# Patient Record
Sex: Female | Born: 1935 | Race: White | Hispanic: No | Marital: Married | Smoking: Former smoker
Health system: Southern US, Community
[De-identification: ages and names within clinical notes are randomized; demographics above are authoritative.]

## PROBLEM LIST (undated history)

## (undated) DIAGNOSIS — I10 Essential (primary) hypertension: Secondary | ICD-10-CM

## (undated) DIAGNOSIS — U071 COVID-19: Secondary | ICD-10-CM

## (undated) DIAGNOSIS — S7291XA Unspecified fracture of right femur, initial encounter for closed fracture: Secondary | ICD-10-CM

## (undated) DIAGNOSIS — C37 Malignant neoplasm of thymus: Secondary | ICD-10-CM

## (undated) DIAGNOSIS — S42293A Other displaced fracture of upper end of unspecified humerus, initial encounter for closed fracture: Secondary | ICD-10-CM

## (undated) DIAGNOSIS — I4892 Unspecified atrial flutter: Secondary | ICD-10-CM

## (undated) DIAGNOSIS — I4819 Other persistent atrial fibrillation: Secondary | ICD-10-CM

## (undated) DIAGNOSIS — K219 Gastro-esophageal reflux disease without esophagitis: Secondary | ICD-10-CM

## (undated) DIAGNOSIS — R32 Unspecified urinary incontinence: Secondary | ICD-10-CM

## (undated) DIAGNOSIS — I5022 Chronic systolic (congestive) heart failure: Secondary | ICD-10-CM

## (undated) DIAGNOSIS — F329 Major depressive disorder, single episode, unspecified: Secondary | ICD-10-CM

## (undated) DIAGNOSIS — I428 Other cardiomyopathies: Secondary | ICD-10-CM

## (undated) DIAGNOSIS — I5042 Chronic combined systolic (congestive) and diastolic (congestive) heart failure: Secondary | ICD-10-CM

## (undated) DIAGNOSIS — C50919 Malignant neoplasm of unspecified site of unspecified female breast: Secondary | ICD-10-CM

## (undated) DIAGNOSIS — F32A Depression, unspecified: Secondary | ICD-10-CM

## (undated) DIAGNOSIS — E785 Hyperlipidemia, unspecified: Secondary | ICD-10-CM

## (undated) DIAGNOSIS — G5793 Unspecified mononeuropathy of bilateral lower limbs: Secondary | ICD-10-CM

## (undated) DIAGNOSIS — Z87442 Personal history of urinary calculi: Secondary | ICD-10-CM

## (undated) DIAGNOSIS — M1990 Unspecified osteoarthritis, unspecified site: Secondary | ICD-10-CM

## (undated) HISTORY — DX: Malignant neoplasm of unspecified site of unspecified female breast: C50.919

## (undated) HISTORY — DX: Unspecified mononeuropathy of bilateral lower limbs: G57.93

## (undated) HISTORY — PX: ABDOMINAL HYSTERECTOMY: SHX81

## (undated) HISTORY — DX: Other persistent atrial fibrillation: I48.19

## (undated) HISTORY — DX: Unspecified fracture of right femur, initial encounter for closed fracture: S72.91XA

## (undated) HISTORY — DX: Other displaced fracture of upper end of unspecified humerus, initial encounter for closed fracture: S42.293A

## (undated) HISTORY — PX: JOINT REPLACEMENT: SHX530

## (undated) HISTORY — PX: CHOLECYSTECTOMY: SHX55

## (undated) HISTORY — DX: Unspecified osteoarthritis, unspecified site: M19.90

## (undated) HISTORY — DX: COVID-19: U07.1

## (undated) HISTORY — DX: Unspecified atrial flutter: I48.92

## (undated) HISTORY — DX: Major depressive disorder, single episode, unspecified: F32.9

## (undated) HISTORY — DX: Unspecified urinary incontinence: R32

## (undated) HISTORY — PX: KNEE SURGERY: SHX244

## (undated) HISTORY — DX: Malignant neoplasm of thymus: C37

## (undated) HISTORY — DX: Other cardiomyopathies: I42.8

## (undated) HISTORY — PX: CATARACT EXTRACTION: SUR2

## (undated) HISTORY — DX: Chronic systolic (congestive) heart failure: I50.22

## (undated) HISTORY — PX: BREAST LUMPECTOMY: SHX2

## (undated) HISTORY — DX: Depression, unspecified: F32.A

## (undated) HISTORY — DX: Gastro-esophageal reflux disease without esophagitis: K21.9

## (undated) HISTORY — DX: Hyperlipidemia, unspecified: E78.5

## (undated) HISTORY — DX: Personal history of urinary calculi: Z87.442

---
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
## 1996-05-15 DIAGNOSIS — C50919 Malignant neoplasm of unspecified site of unspecified female breast: Secondary | ICD-10-CM

## 1996-05-15 HISTORY — DX: Malignant neoplasm of unspecified site of unspecified female breast: C50.919

## 2010-12-02 DIAGNOSIS — Z79899 Other long term (current) drug therapy: Secondary | ICD-10-CM | POA: Insufficient documentation

## 2011-03-01 DIAGNOSIS — I6529 Occlusion and stenosis of unspecified carotid artery: Secondary | ICD-10-CM | POA: Insufficient documentation

## 2011-03-03 DIAGNOSIS — J310 Chronic rhinitis: Secondary | ICD-10-CM | POA: Insufficient documentation

## 2011-10-03 DIAGNOSIS — G64 Other disorders of peripheral nervous system: Secondary | ICD-10-CM | POA: Insufficient documentation

## 2011-10-03 DIAGNOSIS — H353190 Nonexudative age-related macular degeneration, unspecified eye, stage unspecified: Secondary | ICD-10-CM | POA: Insufficient documentation

## 2011-11-15 DIAGNOSIS — E041 Nontoxic single thyroid nodule: Secondary | ICD-10-CM | POA: Insufficient documentation

## 2011-11-15 DIAGNOSIS — R918 Other nonspecific abnormal finding of lung field: Secondary | ICD-10-CM | POA: Insufficient documentation

## 2011-12-04 DIAGNOSIS — R0602 Shortness of breath: Secondary | ICD-10-CM | POA: Insufficient documentation

## 2012-01-05 DIAGNOSIS — R0789 Other chest pain: Secondary | ICD-10-CM | POA: Insufficient documentation

## 2012-06-24 DIAGNOSIS — M26649 Arthritis of unspecified temporomandibular joint: Secondary | ICD-10-CM | POA: Insufficient documentation

## 2012-11-05 DIAGNOSIS — N6019 Diffuse cystic mastopathy of unspecified breast: Secondary | ICD-10-CM | POA: Insufficient documentation

## 2013-07-21 DIAGNOSIS — Z229 Carrier of infectious disease, unspecified: Secondary | ICD-10-CM | POA: Insufficient documentation

## 2014-05-22 ENCOUNTER — Ambulatory Visit: Payer: Self-pay | Admitting: Nurse Practitioner

## 2014-06-15 DIAGNOSIS — M5116 Intervertebral disc disorders with radiculopathy, lumbar region: Secondary | ICD-10-CM | POA: Insufficient documentation

## 2014-06-15 DIAGNOSIS — M5416 Radiculopathy, lumbar region: Secondary | ICD-10-CM | POA: Insufficient documentation

## 2014-06-15 DIAGNOSIS — M47816 Spondylosis without myelopathy or radiculopathy, lumbar region: Secondary | ICD-10-CM | POA: Insufficient documentation

## 2014-06-15 DIAGNOSIS — M4726 Other spondylosis with radiculopathy, lumbar region: Secondary | ICD-10-CM | POA: Insufficient documentation

## 2014-06-15 DIAGNOSIS — M48061 Spinal stenosis, lumbar region without neurogenic claudication: Secondary | ICD-10-CM | POA: Insufficient documentation

## 2014-08-13 ENCOUNTER — Ambulatory Visit
Admit: 2014-08-13 | Disposition: A | Discharge status: Home / Self Care | Payer: Self-pay | Attending: Family Medicine | Admitting: Family Medicine

## 2014-09-24 ENCOUNTER — Other Ambulatory Visit: Payer: Self-pay | Admitting: Family Medicine

## 2014-09-24 DIAGNOSIS — R1084 Generalized abdominal pain: Secondary | ICD-10-CM

## 2014-09-24 DIAGNOSIS — R1032 Left lower quadrant pain: Secondary | ICD-10-CM

## 2014-09-25 ENCOUNTER — Ambulatory Visit
Admission: RE | Admit: 2014-09-25 | Discharge: 2014-09-25 | Disposition: A | Discharge status: Home / Self Care | Payer: Medicare Other | Source: Ambulatory Visit | Attending: Family Medicine | Admitting: Family Medicine

## 2014-09-25 DIAGNOSIS — R1032 Left lower quadrant pain: Secondary | ICD-10-CM | POA: Diagnosis present

## 2014-09-25 DIAGNOSIS — Z90710 Acquired absence of both cervix and uterus: Secondary | ICD-10-CM | POA: Insufficient documentation

## 2014-09-25 DIAGNOSIS — R1084 Generalized abdominal pain: Secondary | ICD-10-CM

## 2014-10-05 ENCOUNTER — Other Ambulatory Visit: Payer: Self-pay | Admitting: Family Medicine

## 2014-10-05 DIAGNOSIS — R109 Unspecified abdominal pain: Secondary | ICD-10-CM

## 2014-10-15 ENCOUNTER — Ambulatory Visit
Admission: RE | Admit: 2014-10-15 | Discharge: 2014-10-15 | Disposition: A | Discharge status: Home / Self Care | Payer: Medicare Other | Source: Ambulatory Visit | Attending: Family Medicine | Admitting: Family Medicine

## 2014-10-15 DIAGNOSIS — J479 Bronchiectasis, uncomplicated: Secondary | ICD-10-CM | POA: Diagnosis not present

## 2014-10-15 DIAGNOSIS — K838 Other specified diseases of biliary tract: Secondary | ICD-10-CM | POA: Diagnosis not present

## 2014-10-15 DIAGNOSIS — N200 Calculus of kidney: Secondary | ICD-10-CM | POA: Insufficient documentation

## 2014-10-15 DIAGNOSIS — N2889 Other specified disorders of kidney and ureter: Secondary | ICD-10-CM | POA: Diagnosis present

## 2014-10-15 DIAGNOSIS — R109 Unspecified abdominal pain: Secondary | ICD-10-CM

## 2014-10-15 DIAGNOSIS — K868 Other specified diseases of pancreas: Secondary | ICD-10-CM | POA: Insufficient documentation

## 2014-10-15 DIAGNOSIS — K862 Cyst of pancreas: Secondary | ICD-10-CM | POA: Diagnosis not present

## 2014-10-15 HISTORY — DX: Essential (primary) hypertension: I10

## 2014-10-15 MED ORDER — IOHEXOL 300 MG/ML  SOLN
100.0000 mL | Freq: Once | INTRAMUSCULAR | Status: AC | PRN
  Administered 2014-10-15: 100 mL via INTRAVENOUS

## 2014-10-16 ENCOUNTER — Telehealth: Payer: Self-pay | Admitting: Family Medicine

## 2014-10-16 NOTE — Telephone Encounter (Signed)
Contacted pt. Regarding CT Scan and MRI results. Pt. Was asked to make an appointment next week for obtaining bilirubin levels.

## 2014-10-20 ENCOUNTER — Ambulatory Visit (INDEPENDENT_AMBULATORY_CARE_PROVIDER_SITE_OTHER): Payer: Medicare Other | Admitting: Family Medicine

## 2014-10-20 ENCOUNTER — Encounter: Payer: Self-pay | Admitting: Family Medicine

## 2014-10-20 VITALS — BP 100/60 | HR 75 | Resp 15 | Ht 66.0 in | Wt 165.5 lb

## 2014-10-20 DIAGNOSIS — K838 Other specified diseases of biliary tract: Secondary | ICD-10-CM

## 2014-10-20 DIAGNOSIS — N200 Calculus of kidney: Secondary | ICD-10-CM | POA: Diagnosis not present

## 2014-10-20 DIAGNOSIS — K862 Cyst of pancreas: Secondary | ICD-10-CM | POA: Insufficient documentation

## 2014-10-20 NOTE — Progress Notes (Signed)
Name: Jodi Bullock   MRN: [id number]    DOB: Feb 01, 1925   Date:10/20/2014       Progress Note  Subjective  Chief Complaint  Chief Complaint  Patient presents with  . Follow-up    Patient reported you wanted he to come back for labs    HPI Comments: Pt. Here for follow up of MRI abdomen obtained on June 2nd, 2016. It showed possible mild dilatation of Common bile duct and pancreatic duct. No obstructing lesion identified.Interpreting radiologist commented that cannot rule out an ampullary lesion and recommended correlation with bilirubin levels. She is here for obtaining bilirubin levels.        Past Medical History  Diagnosis Date  . Cancer   . Hypertension    Past Surgical History  Procedure Laterality Date  . Abdominal hysterectomy    . Breast lumpectomy    . Joint replacement    . Cataract extraction    . Cholecystectomy     Family History  Problem Relation Age of Onset  . Stroke Mother   . Diabetes Father   . Heart disease Father       History  Substance Use Topics  . Smoking status: Former Research scientist (life sciences)  . Smokeless tobacco: Never Used  . Alcohol Use: No     Current outpatient prescriptions:  .  amLODipine (NORVASC) 2.5 MG tablet, Take 2.5 tablets by mouth daily., Disp: , Rfl: 3 .  aspirin 81 MG tablet, Take 1 tablet by mouth daily., Disp: , Rfl:  .  Cholecalciferol 2000 UNITS TABS, Take 1 tablet by mouth daily., Disp: , Rfl:  .  LORazepam (ATIVAN) 0.5 MG tablet, Take 0.5 tablets by mouth 2 (two) times daily., Disp: , Rfl: 3 .  losartan (COZAAR) 100 MG tablet, Take 100 mg by mouth daily., Disp: , Rfl: 3 .  lovastatin (MEVACOR) 40 MG tablet, Take 40 mg by mouth daily., Disp: , Rfl: 3 .  metoprolol tartrate (LOPRESSOR) 25 MG tablet, Take 25 mg by mouth 2 (two) times daily., Disp: , Rfl: 3 .  Multiple Vitamins-Minerals (PRESERVISION/LUTEIN) CAPS, Take 1 each by mouth 2 (two) times daily., Disp: , Rfl:  .  niacin (NIASPAN) 1000 MG CR tablet, Take 100 mg by mouth  daily., Disp: , Rfl: 3 .  oxybutynin (DITROPAN) 5 MG tablet, Take 2.5 mg by mouth 2 (two) times daily., Disp: , Rfl: 3 .  Polyethylene Glycol 1450 LIQD, POLYETHYLENE GLYCOL (Powder) - Historical Medication  17 grams daily Active, Disp: , Rfl:  .  traMADol (ULTRAM) 50 MG tablet, Take 50 mg by mouth 2 (two) times daily., Disp: , Rfl: 3  Allergies  Allergen Reactions  . Celebrex [Celecoxib]     Esophageal spasms    . Ciprofloxacin   . Ibuprofen   . Metronidazole     Review of Systems  Gastrointestinal: Negative for nausea, vomiting, abdominal pain, diarrhea, constipation and blood in stool.  Skin: Negative for itching.      Objective  Filed Vitals:   10/20/14 0851  BP: 100/60  Pulse: 75  Resp: 15  Height: 5\' 6"  (1.676 m)  Weight: 165 lb 8 oz (75.07 kg)     Physical Exam  Constitutional: She is well-developed, well-nourished, and in no distress.  HENT:  Head: Normocephalic and atraumatic.  Cardiovascular: Normal rate and regular rhythm.   Pulmonary/Chest: Effort normal.  Abdominal: Soft. There is tenderness in the right upper quadrant and epigastric area.  Nursing note and vitals reviewed.     No  results found for this or any previous visit (from the past 2160 hour(s)).   Assessment & Plan  1. Common bile duct dilatation We will obtain bilirubin levels to correlate with common bile duct and pancreatic duct dilatation seen on MRI Abdomen.  - Comprehensive metabolic panel - Bilirubin, fractionated (tot/dir/indir)  2. Pancreatic cyst Repeat MRI Abdomen in June 2017. Copies of CT Scan and MRI provided to the pt. She was reassured.  3. Kidney Stones Small upper pole kidney stones demonstrated on CT scan of Abdomen and Pelvis. Pt. Has an appointment with Urologist on June 27th.    Jodi Bullock [facility] 10/20/2014 9:22 AM

## 2014-10-21 LAB — COMPREHENSIVE METABOLIC PANEL
A/G RATIO: 2.2 (ref 1.1–2.5)
ALT: 13 IU/L (ref 0–32)
AST: 14 IU/L (ref 0–40)
Albumin: 4.4 g/dL (ref 3.5–4.7)
Alkaline Phosphatase: 107 IU/L (ref 39–117)
BUN/Creatinine Ratio: 17 (ref 11–26)
BUN: 13 mg/dL (ref 8–27)
Bilirubin Total: 0.5 mg/dL (ref 0.0–1.2)
CHLORIDE: 101 mmol/L (ref 97–108)
CO2: 25 mmol/L (ref 18–29)
Calcium: 9.3 mg/dL (ref 8.7–10.3)
Creatinine, Ser: 0.78 mg/dL (ref 0.57–1.00)
GFR calc Af Amer: 79 mL/min/{1.73_m2} (ref >59)
GFR, EST NON AFRICAN AMERICAN: 69 mL/min/{1.73_m2} (ref >59)
GLOBULIN, TOTAL: 2 g/dL (ref 1.5–4.5)
Glucose: 113 mg/dL — ABNORMAL HIGH (ref 65–99)
Potassium: 4.3 mmol/L (ref 3.5–5.2)
Sodium: 142 mmol/L (ref 134–144)
Total Protein: 6.4 g/dL (ref 6.0–8.5)

## 2014-10-21 LAB — BILIRUBIN, FRACTIONATED(TOT/DIR/INDIR)
Bilirubin, Direct: 0.13 mg/dL (ref 0.00–0.40)
Bilirubin, Indirect: 0.37 mg/dL (ref 0.10–0.80)

## 2014-10-27 ENCOUNTER — Other Ambulatory Visit: Payer: Self-pay | Admitting: Family Medicine

## 2014-10-27 NOTE — Telephone Encounter (Signed)
Requesting refill on Oxybutynin. Please send to [facility]

## 2014-10-28 MED ORDER — OXYBUTYNIN CHLORIDE 5 MG PO TABS: 2.5000 mg | ORAL_TABLET | Freq: Two times a day (BID) | ORAL | Status: DC

## 2014-10-28 NOTE — Telephone Encounter (Signed)
Medication entered

## 2014-11-09 ENCOUNTER — Ambulatory Visit: Payer: Self-pay

## 2014-11-24 ENCOUNTER — Ambulatory Visit (INDEPENDENT_AMBULATORY_CARE_PROVIDER_SITE_OTHER): Payer: Medicare Other | Admitting: Urology

## 2014-11-24 ENCOUNTER — Encounter: Payer: Self-pay | Admitting: Urology

## 2014-11-24 VITALS — BP 166/66 | HR 66 | Ht 66.0 in | Wt 169.0 lb

## 2014-11-24 DIAGNOSIS — C50919 Malignant neoplasm of unspecified site of unspecified female breast: Secondary | ICD-10-CM | POA: Insufficient documentation

## 2014-11-24 DIAGNOSIS — R3915 Urgency of urination: Secondary | ICD-10-CM | POA: Insufficient documentation

## 2014-11-24 DIAGNOSIS — A0472 Enterocolitis due to Clostridium difficile, not specified as recurrent: Secondary | ICD-10-CM | POA: Insufficient documentation

## 2014-11-24 DIAGNOSIS — N3946 Mixed incontinence: Secondary | ICD-10-CM | POA: Diagnosis not present

## 2014-11-24 DIAGNOSIS — R32 Unspecified urinary incontinence: Secondary | ICD-10-CM | POA: Insufficient documentation

## 2014-11-24 DIAGNOSIS — N200 Calculus of kidney: Secondary | ICD-10-CM | POA: Diagnosis not present

## 2014-11-24 LAB — BLADDER SCAN AMB NON-IMAGING: Scan Result: 95

## 2014-11-24 LAB — URINALYSIS, COMPLETE
Bilirubin, UA: NEGATIVE
Glucose, UA: NEGATIVE
Ketones, UA: NEGATIVE
LEUKOCYTES UA: NEGATIVE
Nitrite, UA: NEGATIVE
Protein, UA: NEGATIVE
RBC, UA: NEGATIVE
SPEC GRAV UA: 1.01 (ref 1.005–1.030)
Urobilinogen, Ur: 0.2 mg/dL (ref 0.2–1.0)
pH, UA: 7 (ref 5.0–7.5)

## 2014-11-24 LAB — MICROSCOPIC EXAMINATION: Bacteria, UA: NONE SEEN

## 2014-11-24 NOTE — Progress Notes (Signed)
I have been asked to see the patient by Dr. Rochel Brome, for evaluation and management of urinary incontinence.  History of present illness: 77F seen today for urinary incontinence.  This is a long standing problem.  She has been treated with oxybutynin for this.  She was noted to have small non-obstructing kidney stones bilaterally on most recent CT scan.  Urinary incontinence: The patient has been seen and evaluated by urologist in turn [place]. She was started on myrbetriq 25 mg daily. This was stopped because of headaches or neck correlated with her taking myrbetriq. She was then tried on several different anticholinergic medications and ultimately was found to respond best with oxybutynin 5 mg. She takes this twice daily. Her symptoms have improved such that she is only using 1 panty liner per day which she splits in half during the day. The patient denies any stress incontinence. She feels as if she empties her bladder completely. She states that her stream is relatively strong. She does not have to strain to void. She does not have postvoid dribbling. She does not have a history of recurrent urinary tract infections. The patient has no history of prolapse. She did have her uterus removed in 1965.  The patient's kidney stones which were noted as incidental findings on her recent CT scan are in the upper pole bilaterally. These are isolated stones measuring less than 5 mm. They're nonobstructing. The patient denies any flank pain. She denies any gross hematuria. She denies any history of stone passage in the past. She has no family history of kidney stones.  Review of systems: A 12 point comprehensive review of systems was obtained and is negative unless otherwise stated in the history of present illness.  Patient Active Problem List   Diagnosis Date Noted  . Breast CA 11/24/2014  . C. difficile colitis 11/24/2014  . Absence of bladder continence 11/24/2014  . Urgency of micturation  11/24/2014  . Common bile duct dilatation 10/20/2014  . Pancreatic cyst 10/20/2014  . Kidney stones 10/20/2014  . Atypical chest pain 01/05/2012  . Lung mass 11/15/2011  . Carotid artery narrowing 03/01/2011    Current Outpatient Prescriptions on File Prior to Visit  Medication Sig Dispense Refill  . amLODipine (NORVASC) 2.5 MG tablet Take 2.5 mg by mouth daily.   3  . aspirin 81 MG tablet Take 1 tablet by mouth daily.    . Cholecalciferol 2000 UNITS TABS Take 1 tablet by mouth daily.    Marland Kitchen LORazepam (ATIVAN) 0.5 MG tablet Take 0.5 tablets by mouth 2 (two) times daily.  3  . losartan (COZAAR) 100 MG tablet Take 100 mg by mouth daily.  3  . lovastatin (MEVACOR) 40 MG tablet Take 40 mg by mouth daily.  3  . metoprolol tartrate (LOPRESSOR) 25 MG tablet Take 12.5 mg by mouth 2 (two) times daily.   3  . Multiple Vitamins-Minerals (PRESERVISION/LUTEIN) CAPS Take 1 each by mouth 2 (two) times daily.    . niacin (NIASPAN) 1000 MG CR tablet Take 100 mg by mouth daily.  3  . oxybutynin (DITROPAN) 5 MG tablet Take 0.5 tablets (2.5 mg total) by mouth 2 (two) times daily. 60 tablet 2  . Polyethylene Glycol 1450 LIQD POLYETHYLENE GLYCOL (Powder) - Historical Medication  17 grams daily Active    . traMADol (ULTRAM) 50 MG tablet Take 50 mg by mouth 2 (two) times daily.  3   No current facility-administered medications on file prior to visit.  Past Medical History  Diagnosis Date  . Cancer   . Hypertension   . Breast cancer     Past Surgical History  Procedure Laterality Date  . Abdominal hysterectomy    . Breast lumpectomy    . Joint replacement    . Cataract extraction    . Cholecystectomy    . Thymus gland excision      History  Substance Use Topics  . Smoking status: Former Research scientist (life sciences)  . Smokeless tobacco: Never Used  . Alcohol Use: No    Family History  Problem Relation Age of Onset  . Stroke Mother   . Diabetes Father   . Heart disease Father     PE: There were no vitals  filed for this visit. Patient appears to be in no acute distress  patient is alert and oriented x3 Atraumatic normocephalic head No cervical or supraclavicular lymphadenopathy appreciated No increased work of breathing, no audible wheezes/rhonchi Regular sinus rhythm/rate Abdomen is soft, nontender, nondistended, no CVA or suprapubic tenderness Lower extremities are symmetric without appreciable edema Grossly neurologically intact No identifiable skin lesions  No results for input(s): WBC, HGB, HCT in the last 72 hours. No results for input(s): NA, K, CL, CO2, GLUCOSE, BUN, CREATININE, CALCIUM in the last 72 hours. No results for input(s): LABPT, INR in the last 72 hours. No results for input(s): LABURIN in the last 72 hours. No results found for this or any previous visit. Postvoid residual:95 mL  Imaging: none  Imp: #1 urge incontinence-this is been evaluated and worked up by a urologist in [place]. She has cycled through the medications and ultimately settled for oxybutynin. She is not tolerated this well given the side effects of dry mouth, dry eyes, and constipation. We discussed a second trial of myrbetriq 25 mg daily as this drug does not have the same anticholinergic side effects. If she is able to tolerate this but has not met her goal we could increase it to 50 mg. We will follow up in a month to see if she is made any progress at how well she tolerates the medication. #2 bilateral nonobstructing nephrolithiasis-we discussed the treatment options for nonobstructing nephrolithiasis. I referred the option of ureteroscopy, laser lithotripsy and stone removal followed by stent placement, annual surveillance with x-ray, and intervention when necessary. The stones of been there a long time and her not causing her any significant issue.after going over the 3 treatment options, we have opted to manage theseconservatively and have the patient follow-up on an as-needed basis for her  nonobstructing kidney stones. Recommendations:  Cc: Dr. Rollene Fare, [place]

## 2014-11-26 ENCOUNTER — Telehealth: Payer: Self-pay | Admitting: Family Medicine

## 2014-11-26 NOTE — Telephone Encounter (Signed)
Left message for patient to let her know that she already has refills on all of her medication and she can call [facility] on [address] for a refill.

## 2014-11-30 ENCOUNTER — Telehealth: Payer: Self-pay | Admitting: Family Medicine

## 2014-11-30 MED ORDER — NIACIN ER (ANTIHYPERLIPIDEMIC) 1000 MG PO TBCR: 1000.0000 mg | EXTENDED_RELEASE_TABLET | Freq: Every day | ORAL | Status: DC

## 2014-11-30 MED ORDER — AMLODIPINE BESYLATE 2.5 MG PO TABS: 2.5000 mg | ORAL_TABLET | Freq: Every day | ORAL | Status: DC

## 2014-11-30 MED ORDER — METOPROLOL TARTRATE 25 MG PO TABS: 12.5000 mg | ORAL_TABLET | Freq: Two times a day (BID) | ORAL | Status: DC

## 2014-11-30 MED ORDER — LOSARTAN POTASSIUM 100 MG PO TABS: 100.0000 mg | ORAL_TABLET | Freq: Every day | ORAL | Status: DC

## 2014-11-30 MED ORDER — LOVASTATIN 40 MG PO TABS: 40.0000 mg | ORAL_TABLET | Freq: Every day | ORAL | Status: DC

## 2014-11-30 NOTE — Telephone Encounter (Signed)
Medication has been refilled and sent to [facility] [address] Per patient.

## 2014-11-30 NOTE — Telephone Encounter (Signed)
Spoke with Pharmacy and medication has been refilled and is ready for pickup.

## 2014-11-30 NOTE — Telephone Encounter (Signed)
PER TAMEIKA THE PT HAD REFILLS ON HER RX AND SHE EVEN CALLED THE PHARM.

## 2014-11-30 NOTE — Telephone Encounter (Signed)
PT SAID THAT THE PHARM HAS NOT SEEN THE REQUEST FOR ALL HER MEDICATIONS. SHE HAD BROUGHT A PAPER HER TO THE OFFICE AND IT WAS GIVEN TO THE NURSE . THE PATIENT SAID THAT THE PHARM SAY THAT THEY HAVE NOT SEEN A REQUEST YET. SHE IS OUT OF THESE MEDS.

## 2014-12-01 ENCOUNTER — Ambulatory Visit (INDEPENDENT_AMBULATORY_CARE_PROVIDER_SITE_OTHER): Payer: Medicare Other | Admitting: Family Medicine

## 2014-12-01 ENCOUNTER — Encounter: Payer: Self-pay | Admitting: Family Medicine

## 2014-12-01 VITALS — BP 150/80 | HR 75 | Temp 98.0°F | Resp 20 | Ht 66.0 in | Wt 170.2 lb

## 2014-12-01 DIAGNOSIS — F419 Anxiety disorder, unspecified: Secondary | ICD-10-CM | POA: Insufficient documentation

## 2014-12-01 DIAGNOSIS — M791 Myalgia: Secondary | ICD-10-CM

## 2014-12-01 DIAGNOSIS — M7918 Myalgia, other site: Secondary | ICD-10-CM

## 2014-12-01 DIAGNOSIS — E785 Hyperlipidemia, unspecified: Secondary | ICD-10-CM | POA: Diagnosis not present

## 2014-12-01 DIAGNOSIS — I10 Essential (primary) hypertension: Secondary | ICD-10-CM | POA: Diagnosis not present

## 2014-12-01 MED ORDER — AMLODIPINE BESYLATE 5 MG PO TABS: 5.0000 mg | ORAL_TABLET | Freq: Every day | ORAL | Status: DC

## 2014-12-01 MED ORDER — LORAZEPAM 0.5 MG PO TABS: 0.5000 mg | ORAL_TABLET | Freq: Two times a day (BID) | ORAL | Status: DC

## 2014-12-01 MED ORDER — TRAMADOL HCL 50 MG PO TABS: 50.0000 mg | ORAL_TABLET | Freq: Two times a day (BID) | ORAL | Status: DC

## 2014-12-01 NOTE — Progress Notes (Signed)
Name: Jodi Bullock   MRN: [id number]    DOB: 12-Aug-1924   Date:12/01/2014       Progress Note  Subjective  Chief Complaint  Chief Complaint  Patient presents with  . Follow-up    3 mo.  . Hypertension  . Hyperlipidemia  . Depression    Hypertension This is a chronic problem. The problem is uncontrolled. Associated symptoms include headaches (soreness in frontal part of head, noticed since she was started on Myrbetriq by Urologist). Pertinent negatives include no anxiety, chest pain, malaise/fatigue, orthopnea, palpitations or shortness of breath. Past treatments include beta blockers, calcium channel blockers and angiotensin blockers. The current treatment provides significant improvement. There is no history of angina, kidney disease, CAD/MI or CVA.  Hyperlipidemia This is a chronic problem. The problem is controlled. Recent lipid tests were reviewed and are normal. Pertinent negatives include no chest pain, leg pain, myalgias or shortness of breath. Current antihyperlipidemic treatment includes statins and nicotinic acid. The current treatment provides significant improvement of lipids. Risk factors for coronary artery disease include dyslipidemia.  Anxiety Presents for follow-up visit. Symptoms include depressed mood, excessive worry, irritability and nervous/anxious behavior. Patient reports no chest pain, insomnia, palpitations, panic or shortness of breath. The severity of symptoms is moderate. The symptoms are aggravated by family issues. The quality of sleep is good.   Past treatments include benzodiazephines. The treatment provided moderate relief. Compliance with prior treatments has been good.      Past Medical History  Diagnosis Date  . Cancer 1998    breast  . Hypertension   . Breast cancer   . Hypoplasia, thymus gland     cancer  . Hypertension   . HLD (hyperlipidemia)   . Neuropathy of both feet   . GERD (gastroesophageal reflux disease)   . History of kidney  stones   . Arthritis   . Depression     Past Surgical History  Procedure Laterality Date  . Abdominal hysterectomy    . Breast lumpectomy    . Joint replacement Right     Hip  . Cataract extraction    . Cholecystectomy    . Knee surgery Right     Family History  Problem Relation Age of Onset  . Stroke Mother   . Diabetes Father   . Heart disease Father   . Kidney disease Neg Hx   . Bladder Cancer Neg Hx     History   Social History  . Marital Status: Married    Spouse Name: N/A  . Number of Children: N/A  . Years of Education: N/A   Occupational History  . Not on file.   Social History Main Topics  . Smoking status: Former Research scientist (life sciences)  . Smokeless tobacco: Never Used     Comment: smoked in high school for a few years only  . Alcohol Use: No  . Drug Use: No  . Sexual Activity: Not Currently   Other Topics Concern  . Not on file   Social History Narrative     Current outpatient prescriptions:  .  amLODipine (NORVASC) 5 MG tablet, Take 1 tablet (5 mg total) by mouth daily., Disp: 30 tablet, Rfl: 0 .  aspirin 81 MG tablet, Take 1 tablet by mouth daily., Disp: , Rfl:  .  Cholecalciferol 2000 UNITS TABS, Take 1 tablet by mouth daily., Disp: , Rfl:  .  LORazepam (ATIVAN) 0.5 MG tablet, Take 1 tablet (0.5 mg total) by mouth 2 (two) times daily., Disp: 60 tablet,  Rfl: 0 .  losartan (COZAAR) 100 MG tablet, Take 1 tablet (100 mg total) by mouth daily., Disp: 30 tablet, Rfl: 3 .  lovastatin (MEVACOR) 40 MG tablet, Take 1 tablet (40 mg total) by mouth daily., Disp: 30 tablet, Rfl: 3 .  metoprolol tartrate (LOPRESSOR) 25 MG tablet, Take 0.5 tablets (12.5 mg total) by mouth 2 (two) times daily., Disp: 30 tablet, Rfl: 3 .  Multiple Vitamins-Minerals (PRESERVISION/LUTEIN) CAPS, Take 1 each by mouth 2 (two) times daily., Disp: , Rfl:  .  niacin (NIASPAN) 1000 MG CR tablet, Take 1 tablet (1,000 mg total) by mouth at bedtime., Disp: 30 tablet, Rfl: 3 .  Polyethylene Glycol 1450  LIQD, POLYETHYLENE GLYCOL (Powder) - Historical Medication  17 grams daily Active, Disp: , Rfl:  .  traMADol (ULTRAM) 50 MG tablet, Take 1 tablet (50 mg total) by mouth 2 (two) times daily., Disp: 60 tablet, Rfl: 2  Allergies  Allergen Reactions  . Celebrex [Celecoxib]     Esophageal spasms    . Ciprofloxacin   . Ibuprofen   . Metronidazole      Review of Systems  Constitutional: Positive for irritability. Negative for malaise/fatigue.  Respiratory: Negative for shortness of breath.   Cardiovascular: Negative for chest pain, palpitations and orthopnea.  Musculoskeletal: Negative for myalgias.  Neurological: Positive for headaches (soreness in frontal part of head, noticed since she was started on Myrbetriq by Urologist).  Psychiatric/Behavioral: The patient is nervous/anxious. The patient does not have insomnia.       Objective  Filed Vitals:   12/01/14 0951 12/01/14 1044  BP: 150/75 150/80  Pulse: 75   Temp: 98 F (36.7 C)   TempSrc: Oral   Resp: 20   Height: 5\' 6"  (1.676 m)   Weight: 170 lb 3.2 oz (77.202 kg)   SpO2: 92%     Physical Exam  Constitutional: She is oriented to person, place, and time and well-developed, well-nourished, and in no distress.  HENT:  Head: Normocephalic and atraumatic.  Cardiovascular: Normal rate and regular rhythm.   Pulmonary/Chest: Effort normal and breath sounds normal.  Abdominal: Soft. Bowel sounds are normal.  Musculoskeletal: She exhibits no edema.  Neurological: She is alert and oriented to person, place, and time.  Skin: Skin is warm and dry.  Psychiatric: Mood, memory, affect and judgment normal.  Nursing note and vitals reviewed.    Assessment & Plan 1. Essential hypertension We will increase amlodipine to 5 mg to manage her poorly controlled blood pressure. Elevated blood pressure most likely from  Myrbetriq which was recently started by urology. Follow-up in one month - amLODipine (NORVASC) 5 MG tablet; Take 1 tablet  (5 mg total) by mouth daily.  Dispense: 30 tablet; Refill: 0  2. HLD (hyperlipidemia)  - Lipid Profile - Comprehensive metabolic panel  3. Anxiety Symptoms stable and controlled on lorazepam 0.5 mg twice a day when necessary. Patient is aware of the dependence potential of benzodiazepines. She is taking the medication as directed and only as needed.  4. Myofascial pain Long-standing generalized musculoskeletal pain more responsive to tramadol. Refills provided and follow-up in one month. - traMADol (ULTRAM) 50 MG tablet; Take 1 tablet (50 mg total) by mouth 2 (two) times daily.  Dispense: 60 tablet; Refill: 2   [place] [facility] 12/01/2014 10:44 AM

## 2014-12-02 LAB — COMPREHENSIVE METABOLIC PANEL
ALK PHOS: 79 IU/L (ref 39–117)
ALT: 14 IU/L (ref 0–32)
AST: 18 IU/L (ref 0–40)
Albumin/Globulin Ratio: 2 (ref 1.1–2.5)
Albumin: 4.2 g/dL (ref 3.5–4.7)
BUN/Creatinine Ratio: 21 (ref 11–26)
BUN: 14 mg/dL (ref 8–27)
Bilirubin Total: 0.4 mg/dL (ref 0.0–1.2)
CO2: 26 mmol/L (ref 18–29)
CREATININE: 0.66 mg/dL (ref 0.57–1.00)
Calcium: 9.6 mg/dL (ref 8.7–10.3)
Chloride: 104 mmol/L (ref 97–108)
GFR, EST AFRICAN AMERICAN: 92 mL/min/{1.73_m2} (ref >59)
GFR, EST NON AFRICAN AMERICAN: 80 mL/min/{1.73_m2} (ref >59)
GLOBULIN, TOTAL: 2.1 g/dL (ref 1.5–4.5)
GLUCOSE: 107 mg/dL — AB (ref 65–99)
Potassium: 5.2 mmol/L (ref 3.5–5.2)
Sodium: 144 mmol/L (ref 134–144)
TOTAL PROTEIN: 6.3 g/dL (ref 6.0–8.5)

## 2014-12-02 LAB — LIPID PANEL
CHOLESTEROL TOTAL: 138 mg/dL (ref 100–199)
Chol/HDL Ratio: 2 ratio units (ref 0.0–4.4)
HDL: 69 mg/dL (ref >39)
LDL CALC: 54 mg/dL (ref 0–99)
TRIGLYCERIDES: 77 mg/dL (ref 0–149)
VLDL Cholesterol Cal: 15 mg/dL (ref 5–40)

## 2014-12-22 ENCOUNTER — Ambulatory Visit (INDEPENDENT_AMBULATORY_CARE_PROVIDER_SITE_OTHER): Payer: Medicare Other | Admitting: Urology

## 2014-12-22 VITALS — BP 122/63 | HR 64 | Ht 67.0 in | Wt 170.0 lb

## 2014-12-22 DIAGNOSIS — R32 Unspecified urinary incontinence: Secondary | ICD-10-CM | POA: Diagnosis not present

## 2014-12-22 LAB — BLADDER SCAN AMB NON-IMAGING: Scan Result: 32

## 2014-12-22 LAB — URINALYSIS, COMPLETE
BILIRUBIN UA: NEGATIVE
Glucose, UA: NEGATIVE
Ketones, UA: NEGATIVE
Nitrite, UA: NEGATIVE
Protein, UA: NEGATIVE
RBC UA: NEGATIVE
Specific Gravity, UA: 1.01 (ref 1.005–1.030)
Urobilinogen, Ur: 1 mg/dL (ref 0.2–1.0)
pH, UA: 6.5 (ref 5.0–7.5)

## 2014-12-22 LAB — MICROSCOPIC EXAMINATION
BACTERIA UA: NONE SEEN
RBC, UA: NONE SEEN /hpf (ref 0–?)

## 2014-12-22 NOTE — Patient Instructions (Signed)
Recommended an initial trial of myrbetriq 25 mg daily. If tolerated without side effects, I recommend that she double the dose to 50 mg daily. Patient will follow-up with me in 1 month to see how she tolerated myrbetriq.

## 2014-12-22 NOTE — Progress Notes (Signed)
Seen today for f/u of urge History of present illness: 24F seen today for urinary incontinence.  This is a long standing problem.  She has been treated with oxybutynin for this.  She was noted to have small non-obstructing kidney stones bilaterally on most recent CT scan.  Urinary incontinence: The patient has been seen and evaluated by urologist in [facility]. She was started on myrbetriq 25 mg daily. This was stopped because of headaches and correlated with her taking myrbetriq. She was then tried on several different anticholinergic medications and ultimately was found to respond best with oxybutynin 5 mg. She takes this twice daily. Her symptoms have improved such that she is only using 1 panty liner per day which she splits in half during the day. The patient denies any stress incontinence. She feels as if she empties her bladder completely. She states that her stream is relatively strong. She does not have to strain to void. She does not have postvoid dribbling. She does not have a history of recurrent urinary tract infections. The patient has no history of prolapse. She did have her uterus removed in 1965.  Intv: Switched to Myrbetriq 25mg  to minimize her side effects.  Seen today for f/u or symptoms. The patient states that she had taken the myrbetriq for 1 week and then noted a tension headache behind her for head. She was also seen by her PCP who increased her blood pressure medication. The patient is not clear as to whether her headache and increased blood pressure was associated with myrbetriq or not. However, she has not been taking it.  Bilateral small non-obstructing stones.    Patient Active Problem List   Diagnosis Date Noted  . Hypertension 12/01/2014  . HLD (hyperlipidemia) 12/01/2014  . Anxiety 12/01/2014  . Myofascial pain 12/01/2014  . Breast CA 11/24/2014  . C. difficile colitis 11/24/2014  . Absence of bladder continence 11/24/2014  . Urgency of micturation  11/24/2014  . Common bile duct dilatation 10/20/2014  . Pancreatic cyst 10/20/2014  . Kidney stones 10/20/2014  . Atypical chest pain 01/05/2012  . Lung mass 11/15/2011  . Carotid artery narrowing 03/01/2011    Current Outpatient Prescriptions on File Prior to Visit  Medication Sig Dispense Refill  . amLODipine (NORVASC) 5 MG tablet Take 1 tablet (5 mg total) by mouth daily. 30 tablet 0  . aspirin 81 MG tablet Take 1 tablet by mouth daily.    . Cholecalciferol 2000 UNITS TABS Take 1 tablet by mouth daily.    Marland Kitchen LORazepam (ATIVAN) 0.5 MG tablet Take 1 tablet (0.5 mg total) by mouth 2 (two) times daily. 60 tablet 0  . losartan (COZAAR) 100 MG tablet Take 1 tablet (100 mg total) by mouth daily. 30 tablet 3  . lovastatin (MEVACOR) 40 MG tablet Take 1 tablet (40 mg total) by mouth daily. 30 tablet 3  . metoprolol tartrate (LOPRESSOR) 25 MG tablet Take 0.5 tablets (12.5 mg total) by mouth 2 (two) times daily. 30 tablet 3  . Multiple Vitamins-Minerals (PRESERVISION/LUTEIN) CAPS Take 1 each by mouth 2 (two) times daily.    . niacin (NIASPAN) 1000 MG CR tablet Take 1 tablet (1,000 mg total) by mouth at bedtime. 30 tablet 3  . Polyethylene Glycol 1450 LIQD POLYETHYLENE GLYCOL (Powder) - Historical Medication  17 grams daily Active    . traMADol (ULTRAM) 50 MG tablet Take 1 tablet (50 mg total) by mouth 2 (two) times daily. 60 tablet 2   No current facility-administered medications  on file prior to visit.    Past Medical History  Diagnosis Date  . Cancer 1998    breast  . Hypertension   . Breast cancer   . Hypoplasia, thymus gland     cancer  . Hypertension   . HLD (hyperlipidemia)   . Neuropathy of both feet   . GERD (gastroesophageal reflux disease)   . History of kidney stones   . Arthritis   . Depression     Past Surgical History  Procedure Laterality Date  . Abdominal hysterectomy    . Breast lumpectomy    . Joint replacement Right     Hip  . Cataract extraction    .  Cholecystectomy    . Knee surgery Right     History  Substance Use Topics  . Smoking status: Former Research scientist (life sciences)  . Smokeless tobacco: Never Used     Comment: smoked in high school for a few years only  . Alcohol Use: No    Family History  Problem Relation Age of Onset  . Stroke Mother   . Diabetes Father   . Heart disease Father   . Kidney disease Neg Hx   . Bladder Cancer Neg Hx     PE: There were no vitals filed for this visit. The patient continues to have urinary incontinence and nocturia.  No results for input(s): WBC, HGB, HCT in the last 72 hours. No results for input(s): NA, K, CL, CO2, GLUCOSE, BUN, CREATININE, CALCIUM in the last 72 hours. No results for input(s): LABPT, INR in the last 72 hours. No results for input(s): LABURIN in the last 72 hours. Results for orders placed or performed in visit on 11/24/14  Microscopic Examination     Status: None   Collection Time: 11/24/14 11:30 AM  Result Value Ref Range Status   WBC, UA 0-5 0 -  5 /hpf Final   RBC, UA 0-2 0 -  2 /hpf Final   Epithelial Cells (non renal) 0-10 0 - 10 /hpf Final   Bacteria, UA None seen None seen/Few Final   Postvoid residual:95 mL  Imaging: none  Imp: #1 urge incontinence-we discussed the patient's side effects from the myrbetriq. She tends to think that her side effects were not associated with myrbetriq. I recommended that she give it one more try on the medication and if she notes severe headache or worsening blood pressure control again she should stop the medication. If she is tolerating the medication without significant side effects she may want to increase the dose to 50 mg daily if her symptoms are improving. Our plan is to follow up in one month to see how she is doing with the current regimen. If the patient decides that myrbetriq has too many side effects, we will likely move towards posterior tibial nerve stimulation. She has tried numerous anticholinergic, and not found them to be  particular helpful and the side effects intolerable.   #2 bilateral nonobstructing nephrolithiasis- follow-up as needed.  Cc: Dr. Rollene Fare, [place]

## 2014-12-28 ENCOUNTER — Telehealth: Payer: Self-pay | Admitting: Family Medicine

## 2014-12-28 DIAGNOSIS — I10 Essential (primary) hypertension: Secondary | ICD-10-CM

## 2014-12-28 MED ORDER — AMLODIPINE BESYLATE 5 MG PO TABS: 5.0000 mg | ORAL_TABLET | Freq: Every day | ORAL | Status: DC

## 2014-12-28 NOTE — Telephone Encounter (Signed)
Prescription for amlodipine 5 mg daily is sent to patient's pharmacy. Spine clinic referral is for patient's spouse Mr. Armp Delprado, who is also our patient. That referral has already been entered in Mr. Armp Dicke's chart.

## 2014-12-28 NOTE — Telephone Encounter (Signed)
Routed not to Dr. Manuella Ghazi to order spine clinic referral and change medication dosage

## 2014-12-28 NOTE — Telephone Encounter (Signed)
AMLODIPINE (SAID THAT YOU WERE GOING TO INCREASE IT THAT IS WHY SHE IS NEEDING REFILL) PHARM IS [facility] ON [address] ALSO WANTS TO KNOW ABOUT THE SPINE CLINIC REFERRAL WHAT IS THE STATUS ON IT. PER SHELLY THE REFERRAL PERSON SHE HAS NOT RECEIVED ANY MESSAGE TO DO A REFERRAL.

## 2014-12-29 NOTE — Telephone Encounter (Signed)
Medication has been refilled and sent to [facility]

## 2014-12-31 ENCOUNTER — Ambulatory Visit (INDEPENDENT_AMBULATORY_CARE_PROVIDER_SITE_OTHER): Payer: Medicare Other | Admitting: Family Medicine

## 2014-12-31 ENCOUNTER — Encounter: Payer: Self-pay | Admitting: Family Medicine

## 2014-12-31 VITALS — BP 118/74 | HR 71 | Temp 96.9°F | Resp 18 | Ht 67.0 in | Wt 169.9 lb

## 2014-12-31 DIAGNOSIS — R7301 Impaired fasting glucose: Secondary | ICD-10-CM | POA: Diagnosis not present

## 2014-12-31 DIAGNOSIS — I10 Essential (primary) hypertension: Secondary | ICD-10-CM | POA: Diagnosis not present

## 2014-12-31 DIAGNOSIS — Z833 Family history of diabetes mellitus: Secondary | ICD-10-CM | POA: Insufficient documentation

## 2014-12-31 NOTE — Progress Notes (Signed)
Name: Jodi Bullock   MRN: [id number]    DOB: 1924/08/30   Date:12/31/2014       Progress Note  Subjective  Chief Complaint  Chief Complaint  Patient presents with  . Follow-up    4 wk. BP  . Hypertension  . Hyperlipidemia    Hypertension This is a chronic problem. The problem is controlled. Pertinent negatives include no headaches or palpitations. Past treatments include calcium channel blockers, beta blockers and angiotensin blockers. The current treatment provides significant improvement. There is no history of kidney disease, CAD/MI or CVA.  Blood pressure is improved after amlodipine was increased from 2.5 mg to 5 mg daily. No apparent side effects.    Past Medical History  Diagnosis Date  . Cancer 1998    breast  . Hypertension   . Breast cancer   . Hypoplasia, thymus gland     cancer  . Hypertension   . HLD (hyperlipidemia)   . Neuropathy of both feet   . GERD (gastroesophageal reflux disease)   . History of kidney stones   . Arthritis   . Depression     Past Surgical History  Procedure Laterality Date  . Abdominal hysterectomy    . Breast lumpectomy    . Joint replacement Right     Hip  . Cataract extraction    . Cholecystectomy    . Knee surgery Right     Family History  Problem Relation Age of Onset  . Stroke Mother   . Diabetes Father   . Heart disease Father   . Kidney disease Neg Hx   . Bladder Cancer Neg Hx     Social History   Social History  . Marital Status: Married    Spouse Name: N/A  . Number of Children: N/A  . Years of Education: N/A   Occupational History  . Not on file.   Social History Main Topics  . Smoking status: Former Research scientist (life sciences)  . Smokeless tobacco: Never Used     Comment: smoked in high school for a few years only  . Alcohol Use: No  . Drug Use: No  . Sexual Activity: Not Currently   Other Topics Concern  . Not on file   Social History Narrative     Current outpatient prescriptions:  .  amLODipine  (NORVASC) 5 MG tablet, Take 1 tablet (5 mg total) by mouth daily., Disp: 90 tablet, Rfl: 0 .  aspirin 81 MG tablet, Take 1 tablet by mouth daily., Disp: , Rfl:  .  Cholecalciferol 2000 UNITS TABS, Take 1 tablet by mouth daily., Disp: , Rfl:  .  LORazepam (ATIVAN) 0.5 MG tablet, Take 1 tablet (0.5 mg total) by mouth 2 (two) times daily., Disp: 60 tablet, Rfl: 0 .  losartan (COZAAR) 100 MG tablet, Take 1 tablet (100 mg total) by mouth daily., Disp: 30 tablet, Rfl: 3 .  lovastatin (MEVACOR) 40 MG tablet, Take 1 tablet (40 mg total) by mouth daily., Disp: 30 tablet, Rfl: 3 .  metoprolol tartrate (LOPRESSOR) 25 MG tablet, Take 0.5 tablets (12.5 mg total) by mouth 2 (two) times daily., Disp: 30 tablet, Rfl: 3 .  Multiple Vitamins-Minerals (PRESERVISION/LUTEIN) CAPS, Take 1 each by mouth 2 (two) times daily., Disp: , Rfl:  .  niacin (NIASPAN) 1000 MG CR tablet, Take 1 tablet (1,000 mg total) by mouth at bedtime., Disp: 30 tablet, Rfl: 3 .  oxybutynin (DITROPAN) 5 MG tablet, , Disp: , Rfl: 2 .  Polyethylene Glycol 1450 LIQD, POLYETHYLENE GLYCOL (  Powder) - Historical Medication  17 grams daily Active, Disp: , Rfl:  .  traMADol (ULTRAM) 50 MG tablet, Take 1 tablet (50 mg total) by mouth 2 (two) times daily., Disp: 60 tablet, Rfl: 2  Allergies  Allergen Reactions  . Celebrex [Celecoxib]     Esophageal spasms    . Ciprofloxacin   . Ibuprofen   . Metronidazole      Review of Systems  Cardiovascular: Negative for palpitations.  Neurological: Negative for headaches.      Objective  Filed Vitals:   12/31/14 1046  BP: 118/74  Pulse: 71  Temp: 96.9 F (36.1 C)  TempSrc: Oral  Resp: 18  Height: 5\' 7"  (1.702 m)  Weight: 169 lb 14.4 oz (77.066 kg)  SpO2: 97%    Physical Exam  Constitutional: She is well-developed, well-nourished, and in no distress.  Cardiovascular: Normal rate and regular rhythm.   Pulmonary/Chest: Effort normal and breath sounds normal.  Musculoskeletal:       Right  ankle: She exhibits swelling. Tenderness.       Left ankle: She exhibits swelling.  Nursing note and vitals reviewed.   Assessment & Plan  1. Essential hypertension Blood Pressure is at goal on present therapy. Discussed ankle edema as a possible side effect of amlodipine. Patient is going to monitor her symptoms and RTC if the swelling gets worse.  2. Fasting hyperglycemia  - HgB A1c    Matie Dimaano Asad [facility] 12/31/2014 11:11 AM

## 2015-01-01 LAB — HEMOGLOBIN A1C
Est. average glucose Bld gHb Est-mCnc: 128 mg/dL
Hgb A1c MFr Bld: 6.1 % — ABNORMAL HIGH (ref 4.8–5.6)

## 2015-01-20 ENCOUNTER — Ambulatory Visit: Payer: Medicare Other

## 2015-01-25 ENCOUNTER — Ambulatory Visit (INDEPENDENT_AMBULATORY_CARE_PROVIDER_SITE_OTHER): Payer: Medicare Other | Admitting: Urology

## 2015-01-25 ENCOUNTER — Encounter: Payer: Self-pay | Admitting: Urology

## 2015-01-25 VITALS — BP 129/70 | HR 76 | Ht 66.0 in | Wt 168.5 lb

## 2015-01-25 DIAGNOSIS — Z9181 History of falling: Secondary | ICD-10-CM | POA: Insufficient documentation

## 2015-01-25 DIAGNOSIS — K6289 Other specified diseases of anus and rectum: Secondary | ICD-10-CM | POA: Insufficient documentation

## 2015-01-25 DIAGNOSIS — Z853 Personal history of malignant neoplasm of breast: Secondary | ICD-10-CM | POA: Insufficient documentation

## 2015-01-25 DIAGNOSIS — R42 Dizziness and giddiness: Secondary | ICD-10-CM | POA: Insufficient documentation

## 2015-01-25 DIAGNOSIS — N200 Calculus of kidney: Secondary | ICD-10-CM

## 2015-01-25 DIAGNOSIS — Z23 Encounter for immunization: Secondary | ICD-10-CM | POA: Insufficient documentation

## 2015-01-25 DIAGNOSIS — C37 Malignant neoplasm of thymus: Secondary | ICD-10-CM | POA: Insufficient documentation

## 2015-01-25 DIAGNOSIS — M1990 Unspecified osteoarthritis, unspecified site: Secondary | ICD-10-CM | POA: Insufficient documentation

## 2015-01-25 DIAGNOSIS — IMO0001 Reserved for inherently not codable concepts without codable children: Secondary | ICD-10-CM | POA: Insufficient documentation

## 2015-01-25 DIAGNOSIS — E559 Vitamin D deficiency, unspecified: Secondary | ICD-10-CM | POA: Insufficient documentation

## 2015-01-25 DIAGNOSIS — R51 Headache: Secondary | ICD-10-CM

## 2015-01-25 DIAGNOSIS — Z1331 Encounter for screening for depression: Secondary | ICD-10-CM | POA: Insufficient documentation

## 2015-01-25 DIAGNOSIS — N39498 Other specified urinary incontinence: Secondary | ICD-10-CM | POA: Diagnosis not present

## 2015-01-25 DIAGNOSIS — E079 Disorder of thyroid, unspecified: Secondary | ICD-10-CM | POA: Insufficient documentation

## 2015-01-25 DIAGNOSIS — R519 Headache, unspecified: Secondary | ICD-10-CM | POA: Insufficient documentation

## 2015-01-25 DIAGNOSIS — K219 Gastro-esophageal reflux disease without esophagitis: Secondary | ICD-10-CM | POA: Insufficient documentation

## 2015-01-25 DIAGNOSIS — N3941 Urge incontinence: Secondary | ICD-10-CM

## 2015-01-25 DIAGNOSIS — I739 Peripheral vascular disease, unspecified: Secondary | ICD-10-CM | POA: Insufficient documentation

## 2015-01-25 DIAGNOSIS — J349 Unspecified disorder of nose and nasal sinuses: Secondary | ICD-10-CM | POA: Insufficient documentation

## 2015-01-25 DIAGNOSIS — Z789 Other specified health status: Secondary | ICD-10-CM | POA: Insufficient documentation

## 2015-01-25 DIAGNOSIS — Z87898 Personal history of other specified conditions: Secondary | ICD-10-CM | POA: Insufficient documentation

## 2015-01-25 DIAGNOSIS — E010 Iodine-deficiency related diffuse (endemic) goiter: Secondary | ICD-10-CM | POA: Insufficient documentation

## 2015-01-25 LAB — URINALYSIS, COMPLETE
BILIRUBIN UA: NEGATIVE
Glucose, UA: NEGATIVE
NITRITE UA: NEGATIVE
RBC, UA: NEGATIVE
Specific Gravity, UA: 1.025 (ref 1.005–1.030)
Urobilinogen, Ur: 4 mg/dL — ABNORMAL HIGH (ref 0.2–1.0)
pH, UA: 5.5 (ref 5.0–7.5)

## 2015-01-25 LAB — MICROSCOPIC EXAMINATION: RBC, UA: NONE SEEN /hpf (ref 0–?)

## 2015-01-25 NOTE — Progress Notes (Signed)
F/u -  1 - urge incontinence - patient with a long h/o urge incontinence. She has tried multiple anticholinergic. She was on oxybutynin 5 mg BID. In [place], per her recollection she tried Retail [employer], and [employer], too. She had bothersome dry mouth. She started and titrated up to Myrbetriq 50 mg. She has frequency and urgency. She wears 3 - 4 of pads per day and they are wet at night (uses a heavy pad). No gross hematuria or dysuria.   UA clear   2-small bilateral stones - noted on June 2016 CT. She has had no flank pain or stone passage.    ROS - 14 system - + for back pain, sinus problems, anxiety   A/P -  Stones - stable  Urgency, UUI - discussed nature, r/b of combination anticholinergic + myrbetriq, PT, Interstim, PTNS and Botox. She elects to proceed with PTNS. I gave her some samples of Myrbetriq 50 mg. I might continue Myretriq and wean off if PTNS is effective.

## 2015-01-29 ENCOUNTER — Telehealth: Payer: Self-pay | Admitting: *Deleted

## 2015-01-29 NOTE — Telephone Encounter (Signed)
Tried to contact patient in regards to setting up her PTNS treatments. LMOM. Insurance does not need prior Josem Kaufmann will be on [place] schedule for Friday afternoons. Has patient tried and failed on to meds?

## 2015-02-01 NOTE — Telephone Encounter (Signed)
Patient called back and we scheduled her PTNS appointments for the next six weeks at 130 with Ria Comment.

## 2015-02-02 ENCOUNTER — Encounter: Payer: Self-pay | Admitting: Family Medicine

## 2015-02-02 ENCOUNTER — Ambulatory Visit (INDEPENDENT_AMBULATORY_CARE_PROVIDER_SITE_OTHER): Payer: Medicare Other | Admitting: Family Medicine

## 2015-02-02 VITALS — BP 116/66 | HR 94 | Temp 97.9°F | Resp 16 | Ht 66.0 in | Wt 170.3 lb

## 2015-02-02 DIAGNOSIS — I10 Essential (primary) hypertension: Secondary | ICD-10-CM | POA: Diagnosis not present

## 2015-02-02 DIAGNOSIS — F419 Anxiety disorder, unspecified: Secondary | ICD-10-CM | POA: Diagnosis not present

## 2015-02-02 DIAGNOSIS — R609 Edema, unspecified: Secondary | ICD-10-CM

## 2015-02-02 DIAGNOSIS — M25473 Effusion, unspecified ankle: Secondary | ICD-10-CM

## 2015-02-02 MED ORDER — LORAZEPAM 0.5 MG PO TABS: 0.5000 mg | ORAL_TABLET | Freq: Two times a day (BID) | ORAL | Status: DC | PRN

## 2015-02-02 NOTE — Progress Notes (Signed)
Name: Jodi Bullock   MRN: [id number]    DOB: 06-22-1924   Date:02/02/2015       Progress Note  Subjective  Chief Complaint  Chief Complaint  Patient presents with  . Foot Swelling    HPI  Leg and Ankle Swelling Both ankles and feet swelling since last week, more noticeable since the last weekend.. She has no fevers, chills, dyspnea, or chest pain. In the past, we have discussed peripheral edema as a side effect of amlodipine.  Past Medical History  Diagnosis Date  . Cancer 1998    breast  . Hypertension   . Breast cancer   . Hypoplasia, thymus gland     cancer  . Hypertension   . HLD (hyperlipidemia)   . Neuropathy of both feet   . GERD (gastroesophageal reflux disease)   . History of kidney stones   . Arthritis   . Depression     Past Surgical History  Procedure Laterality Date  . Abdominal hysterectomy    . Breast lumpectomy    . Joint replacement Right     Hip  . Cataract extraction    . Cholecystectomy    . Knee surgery Right     Family History  Problem Relation Age of Onset  . Stroke Mother   . Diabetes Father   . Heart disease Father   . Kidney disease Neg Hx   . Bladder Cancer Neg Hx     Social History   Social History  . Marital Status: Married    Spouse Name: N/A  . Number of Children: N/A  . Years of Education: N/A   Occupational History  . Not on file.   Social History Main Topics  . Smoking status: Former Research scientist (life sciences)  . Smokeless tobacco: Never Used     Comment: smoked in high school for a few years only  . Alcohol Use: No  . Drug Use: No  . Sexual Activity: Not Currently   Other Topics Concern  . Not on file   Social History Narrative     Current outpatient prescriptions:  .  amLODipine (NORVASC) 5 MG tablet, Take 1 tablet (5 mg total) by mouth daily., Disp: 90 tablet, Rfl: 0 .  aspirin 81 MG tablet, Take 1 tablet by mouth daily., Disp: , Rfl:  .  Cholecalciferol 2000 UNITS TABS, Take 1 tablet by mouth daily., Disp: , Rfl:  .   LORazepam (ATIVAN) 0.5 MG tablet, Take 1 tablet (0.5 mg total) by mouth 2 (two) times daily., Disp: 60 tablet, Rfl: 0 .  losartan (COZAAR) 100 MG tablet, Take 1 tablet (100 mg total) by mouth daily., Disp: 30 tablet, Rfl: 3 .  lovastatin (MEVACOR) 40 MG tablet, Take 1 tablet (40 mg total) by mouth daily., Disp: 30 tablet, Rfl: 3 .  metoprolol tartrate (LOPRESSOR) 25 MG tablet, Take 0.5 tablets (12.5 mg total) by mouth 2 (two) times daily., Disp: 30 tablet, Rfl: 3 .  Multiple Vitamins-Minerals (PRESERVISION/LUTEIN) CAPS, Take 1 each by mouth 2 (two) times daily., Disp: , Rfl:  .  niacin (NIASPAN) 1000 MG CR tablet, Take 1 tablet (1,000 mg total) by mouth at bedtime., Disp: 30 tablet, Rfl: 3 .  oxybutynin (DITROPAN) 5 MG tablet, , Disp: , Rfl: 2 .  Polyethylene Glycol 1450 LIQD, POLYETHYLENE GLYCOL (Powder) - Historical Medication  17 grams daily Active, Disp: , Rfl:  .  traMADol (ULTRAM) 50 MG tablet, Take 1 tablet (50 mg total) by mouth 2 (two) times daily., Disp: 60  tablet, Rfl: 2  Allergies  Allergen Reactions  . 2,4-D Dimethylamine (Amisol) Other (See Comments)    Other Reaction: OTHER REACTION-IRRITATION TO Esophagous  . Celebrex [Celecoxib]     Esophageal spasms    . Ciprofloxacin   . Ibuprofen   . Metronidazole   . Other Other (See Comments)    Other Reaction: esophagitis     Review of Systems  Respiratory: Negative for shortness of breath.   Cardiovascular: Positive for leg swelling. Negative for chest pain.  Psychiatric/Behavioral: Positive for depression. The patient is nervous/anxious and has insomnia.    Objective  Filed Vitals:   02/02/15 1147  BP: 116/66  Pulse: 94  Temp: 97.9 F (36.6 C)  Resp: 16  Height: 5\' 6"  (1.676 m)  Weight: 170 lb 5 oz (77.253 kg)  SpO2: 95%    Physical Exam  Constitutional: She is well-developed, well-nourished, and in no distress.  Cardiovascular: Normal rate and regular rhythm.   Pulmonary/Chest: Effort normal and breath sounds  normal.  Musculoskeletal:       Right ankle: She exhibits swelling.       Left ankle: She exhibits swelling. Tenderness.  Nursing note and vitals reviewed.   Assessment & Plan  1. Anxiety Symptoms of anxiety are stable on benzodiazepine therapy. Patient taking the medication as directed and is aware of the dependence potential of lorazepam. Refill provided. - LORazepam (ATIVAN) 0.5 MG tablet; Take 1 tablet (0.5 mg total) by mouth 2 (two) times daily as needed for anxiety.  Dispense: 60 tablet; Refill: 0  2. Essential hypertension Patient is to temporarily discontinue amlodipine and increase metoprolol to 25 mg twice daily. Recheck in 1-2 weeks.  3. Ankle edema DC amlodipine for now and reevaluate in one week.   Syed Asad A[facility] 02/02/2015 12:25 PM

## 2015-02-05 ENCOUNTER — Ambulatory Visit (INDEPENDENT_AMBULATORY_CARE_PROVIDER_SITE_OTHER): Payer: Medicare Other | Admitting: Obstetrics and Gynecology

## 2015-02-05 ENCOUNTER — Encounter: Payer: Self-pay | Admitting: Obstetrics and Gynecology

## 2015-02-05 VITALS — BP 147/69 | HR 94 | Resp 16 | Ht 66.0 in | Wt 171.2 lb

## 2015-02-05 DIAGNOSIS — N3941 Urge incontinence: Secondary | ICD-10-CM | POA: Diagnosis not present

## 2015-02-05 LAB — PTNS-PERCUTANEOUS TIBIAL NERVE STIMULATION: Scan Result: 3

## 2015-02-05 NOTE — Progress Notes (Signed)
Chief Complaint:  Chief Complaint  Patient presents with  . Urge incontinence    PTNS     HPI: Patient is a 81 year old female presenting today for her first PTNS treatment for OAB.      Symptoms began years ago.        Amount of leakage: varies     Leakage occurs multiple times per day- at least 3     Daytime Frequency- 6 per day- varies      Nighttime Frequency 4-5 times per night     Pain or burning with urination      Perception of the need to urinate no awareness, leak right after aware, leak 1-2 minutes after aware, aware-can't get to the bathroom on time, can't get to the bathroom     Incontinence episodes 3-4 perday     Protection used heavy pads 3-4 per day, overnight diapers     Fluid intake unsure of cups of fluid per day,  0 cups of caffeine beverages per day?, # alcohol intake per day?      Previous Therapy: Behavior Modifications      Anticholinergics- Oxybutin, Vesicare, Detrol  Currently taking Myrbetriq.  Will ween off medication pending response to to PTNS treatment.     Other previous therapy  BP 147/69 mmHg  Pulse 94  Resp 16  Ht 5\' 6"  (1.676 m)  Wt 171 lb 3.2 oz (77.656 kg)  BMI 27.65 kg/m2  Contraindications present for PTNS      Pacemaker- No      Implantable defibrillator- No      History of abnormal bleeding- No      History of neuropathies or nerve damage- mild diabetic neuropathy otherwise no  Discussed with patient possible complications of procedure, such as discomfort, bleeding at insertion/stimulation site, procedure consent signed  Patient goals: Reduce urgency and urge incontinence Reduce daytime frequency Reduce enuresis and sleep through the night  PTNS treatment: Right Leg- Level 3-  response toe flex  Treatment Plan:   No fluids before bed.  Continue PTNS treatments.

## 2015-02-10 ENCOUNTER — Ambulatory Visit (INDEPENDENT_AMBULATORY_CARE_PROVIDER_SITE_OTHER): Payer: Medicare Other | Admitting: Family Medicine

## 2015-02-10 ENCOUNTER — Encounter: Payer: Self-pay | Admitting: Family Medicine

## 2015-02-10 VITALS — BP 132/64 | HR 85 | Temp 98.0°F | Resp 16 | Ht 66.0 in | Wt 172.7 lb

## 2015-02-10 DIAGNOSIS — R609 Edema, unspecified: Secondary | ICD-10-CM | POA: Diagnosis not present

## 2015-02-10 DIAGNOSIS — M7918 Myalgia, other site: Secondary | ICD-10-CM

## 2015-02-10 DIAGNOSIS — M791 Myalgia: Secondary | ICD-10-CM

## 2015-02-10 DIAGNOSIS — M25473 Effusion, unspecified ankle: Secondary | ICD-10-CM

## 2015-02-10 MED ORDER — TRAMADOL HCL 50 MG PO TABS: 50.0000 mg | ORAL_TABLET | Freq: Two times a day (BID) | ORAL | Status: DC

## 2015-02-10 NOTE — Progress Notes (Signed)
Name: Jodi Bullock   MRN: [id number]    DOB: 27-Dec-1924   Date:02/10/2015       Progress Note  Subjective  Chief Complaint  Chief Complaint  Patient presents with  . Follow-up    1 week for bilateral foot swelling  . Hypertension    patient states swelling has improved d/c amlodopine and increased metoprolol to 25mg  bid    HPI Pt. Is here for follow up of bilateral ankle swelling. She was seen last week and her ankles were swollen. We had her D/C Amlodipine (due to possible side effect of peripheral edema) and increase Metoprolol to keep blood pressure under control. Her ankle swelling is relatively unchanged. Her BP is stable.  Past Medical History  Diagnosis Date  . Cancer 1998    breast  . Hypertension   . Breast cancer   . Hypoplasia, thymus gland     cancer  . Hypertension   . HLD (hyperlipidemia)   . Neuropathy of both feet   . GERD (gastroesophageal reflux disease)   . History of kidney stones   . Arthritis   . Depression     Past Surgical History  Procedure Laterality Date  . Abdominal hysterectomy    . Breast lumpectomy    . Joint replacement Right     Hip  . Cataract extraction    . Cholecystectomy    . Knee surgery Right     Family History  Problem Relation Age of Onset  . Stroke Mother   . Diabetes Father   . Heart disease Father   . Kidney disease Neg Hx   . Bladder Cancer Neg Hx     Social History   Social History  . Marital Status: Married    Spouse Name: N/A  . Number of Children: N/A  . Years of Education: N/A   Occupational History  . Not on file.   Social History Main Topics  . Smoking status: Former Research scientist (life sciences)  . Smokeless tobacco: Never Used     Comment: smoked in high school for a few years only  . Alcohol Use: No  . Drug Use: No  . Sexual Activity: Not Currently   Other Topics Concern  . Not on file   Social History Narrative     Current outpatient prescriptions:  .  aspirin 81 MG tablet, Take 1 tablet by mouth  daily., Disp: , Rfl:  .  Cholecalciferol 2000 UNITS TABS, Take 1 tablet by mouth daily., Disp: , Rfl:  .  LORazepam (ATIVAN) 0.5 MG tablet, Take 1 tablet (0.5 mg total) by mouth 2 (two) times daily as needed for anxiety., Disp: 60 tablet, Rfl: 0 .  losartan (COZAAR) 100 MG tablet, Take 1 tablet (100 mg total) by mouth daily., Disp: 30 tablet, Rfl: 3 .  lovastatin (MEVACOR) 40 MG tablet, Take 1 tablet (40 mg total) by mouth daily., Disp: 30 tablet, Rfl: 3 .  metoprolol tartrate (LOPRESSOR) 25 MG tablet, Take 0.5 tablets (12.5 mg total) by mouth 2 (two) times daily. (Patient taking differently: Take 25 mg by mouth 2 (two) times daily. ), Disp: 30 tablet, Rfl: 3 .  mirabegron ER (MYRBETRIQ) 25 MG TB24 tablet, Take 25 mg by mouth daily., Disp: , Rfl:  .  Multiple Vitamins-Minerals (PRESERVISION/LUTEIN) CAPS, Take 1 each by mouth 2 (two) times daily., Disp: , Rfl:  .  niacin (NIASPAN) 1000 MG CR tablet, Take 1 tablet (1,000 mg total) by mouth at bedtime., Disp: 30 tablet, Rfl: 3 .  oxybutynin (DITROPAN) 5 MG tablet, , Disp: , Rfl: 2 .  Polyethylene Glycol 1450 LIQD, POLYETHYLENE GLYCOL (Powder) - Historical Medication  17 grams daily Active, Disp: , Rfl:  .  traMADol (ULTRAM) 50 MG tablet, Take 1 tablet (50 mg total) by mouth 2 (two) times daily., Disp: 60 tablet, Rfl: 2  Allergies  Allergen Reactions  . 2,4-D Dimethylamine (Amisol) Other (See Comments)    Other Reaction: OTHER REACTION-IRRITATION TO Esophagous  . Celebrex [Celecoxib]     Esophageal spasms    . Ciprofloxacin   . Ibuprofen   . Metronidazole   . Other Other (See Comments)    Other Reaction: esophagitis   Review of Systems  Constitutional: Negative for fever and chills.  Respiratory: Negative for shortness of breath.   Cardiovascular: Negative for chest pain and leg swelling.    Objective  Filed Vitals:   02/10/15 1540  BP: 132/64  Pulse: 85  Temp: 98 F (36.7 C)  TempSrc: Oral  Resp: 16  Height: 5\' 6"  (1.676 m)   Weight: 172 lb 11.2 oz (78.336 kg)  SpO2: 94%    Physical Exam  Constitutional: She is well-developed, well-nourished, and in no distress.  Cardiovascular: Normal rate and regular rhythm.   Pulmonary/Chest: Effort normal and breath sounds normal.  Crackles at the left lower lobe  Musculoskeletal:       Right ankle: She exhibits swelling.       Left ankle: She exhibits swelling.  Nursing note and vitals reviewed.  Assessment & Plan  1. Ankle edema Ankle swelling relatively unchanged. Discussed diuretic therapy such as with furosemide but patient is unwilling to try at present due to concurrent urinary incontinence. Encouraged to use compression stockings for relief.  2. Myofascial pain Pain is controlled on tramadol 50 mg 2 times daily as needed. Refills provided. - traMADol (ULTRAM) 50 MG tablet; Take 1 tablet (50 mg total) by mouth 2 (two) times daily.  Dispense: 60 tablet; Refill: 2   Syed Asad A[facility] 02/10/2015 3:48 PM

## 2015-02-12 ENCOUNTER — Encounter: Payer: Self-pay | Admitting: Urology

## 2015-02-12 ENCOUNTER — Ambulatory Visit (INDEPENDENT_AMBULATORY_CARE_PROVIDER_SITE_OTHER): Payer: Medicare Other | Admitting: Urology

## 2015-02-12 VITALS — BP 161/72 | HR 81 | Resp 16 | Ht 66.0 in | Wt 172.9 lb

## 2015-02-12 DIAGNOSIS — N3941 Urge incontinence: Secondary | ICD-10-CM

## 2015-02-12 LAB — PTNS-PERCUTANEOUS TIBIAL NERVE STIMULATION: SCAN RESULT: 4

## 2015-02-14 DIAGNOSIS — N3941 Urge incontinence: Secondary | ICD-10-CM | POA: Insufficient documentation

## 2015-02-14 NOTE — Progress Notes (Addendum)
Chief Complaint:  Chief Complaint  Patient presents with  . Urge incontinence    PTNS     HPI: Patient is an 81 year old white female who presents today for her second of 12 weekly PTNS treatment for urge incontinence.    Previous history Patient's urinary symptoms began years ago. Her amount of leakage varies. She states that she has leakage at least 3 times daily. She is making 6 trips during the day to urinate. She is up 4-5 times at night to urinate. She has pain and burning with urination. She has no awareness to the need to urinate, she leaks right after she is aware often one to 2 minutes afterwards. She has difficulty getting to the toilet on time. She is having 3-4 incontinence episodes per day. She is wearing 3-4 heavy pads during the day and overnight depends.  She is unsure of her fluid intake during the day.          Previous Therapy: She has not had Behavioral Modification Techniques.   She has been on oxybutynin, Vesicare and Detrol in the past. They did not provide relief.  She has also been on Myrbetriq, but she suffered headaches with this therapy.         BP 161/72 mmHg  Pulse 81  Resp 16  Ht 5\' 6"  (1.676 m)  Wt 172 lb 14.4 oz (78.427 kg)  BMI 27.92 kg/m2  She does not have any contraindications  present for PTNS, such as:  Pacemaker, Implantable defibrillator, History of abnormal bleeding, History of neuropathies or nerve damage  Discussed with patient possible complications of procedure, such as discomfort, bleeding at insertion/stimulation site, procedure consent signed  After her last visit, she had 8 daytime voids (unchanged), 2 nighttime voids (improvement) and 2 incontinence episodes (improvement).  Patient goals: Her goals for this therapy are to reduce urgency, urge incontinence, daytime frequency, enuresis and sleep through the night.      PTNS treatment: The needle electrode was inserted to the lower, and aspect of the patient's right leg. The  surface electrode was placed on the inside arch of the foot on the treatment leg. The lead set was connected to the stimulator, and the needle electrode clip was connected to the needle electrode. The stimulator that produces an adjustable electrical pulsatile and sacral nerve plexus via the tibial nerve was increased to a 4 until the patient received both a toe flex and sensory response.  Treatment Plan:  The electrode was removed and the patient's leg without difficulty.  She will not to consume fluids before bed. She will return in 1 week for her third PTNS therapy.

## 2015-02-15 ENCOUNTER — Telehealth: Payer: Self-pay | Admitting: Family Medicine

## 2015-02-15 NOTE — Telephone Encounter (Signed)
Pt called wanting to give update on BP readings. Friday it was 167/70, Saturday it was 148/71 and today it is 175/70. She has concerns and would like a call back. Cell # [phone].

## 2015-02-16 ENCOUNTER — Encounter: Payer: Self-pay | Admitting: *Deleted

## 2015-02-16 NOTE — Telephone Encounter (Signed)
Patient was on Myrbetriq by urology, which was likely the the reason her blood pressure was elevated. This has since been discontinued. She has a follow-up appointment on 02/18/2015 and will recheck her blood pressure. Patient reassured.

## 2015-02-16 NOTE — Progress Notes (Signed)
Jodi Bullock came to the clinic c/o elevated blood pressure.  She has been keeping a record of her blood pressure which she had with her; it did indicate a steady increase in her BP.  Her BP today was 194/77 with a heart rate of 74.  I discussed her symptoms w/ Dr. Elnoria Howard and Zara Council, [facility] and instructed Jodi Bullock to discontinue the Myrbetriq and make an appointment with her PCP.  I instructed her on what to do if she started to experience the symptoms of a heart attack. Jodi Bullock was satisfied with the advise and will return to the clinic on 03/01/15 for PTNS at which time she may discuss her medications with [name], NP.

## 2015-02-16 NOTE — Progress Notes (Deleted)
Patient ID: Jodi Bullock, female   DOB: Jul 15, 1924, 81 y.o.   MRN: [id number]

## 2015-02-16 NOTE — Telephone Encounter (Signed)
Routed to Dr. Shah for advice  

## 2015-02-17 NOTE — Telephone Encounter (Signed)
Patient was on Myrbetriq by urology, which was likely the the reason her blood pressure was elevated. This has since been discontinued. She has a follow-up appointment on 02/18/2015 and will recheck her blood pressure. Patient reassured.

## 2015-02-18 ENCOUNTER — Encounter: Payer: Self-pay | Admitting: Family Medicine

## 2015-02-18 ENCOUNTER — Ambulatory Visit (INDEPENDENT_AMBULATORY_CARE_PROVIDER_SITE_OTHER): Payer: Medicare Other | Admitting: Family Medicine

## 2015-02-18 VITALS — BP 148/78 | HR 77 | Temp 98.1°F | Resp 18 | Ht 66.0 in | Wt 170.4 lb

## 2015-02-18 DIAGNOSIS — I10 Essential (primary) hypertension: Secondary | ICD-10-CM | POA: Diagnosis not present

## 2015-02-18 MED ORDER — METOPROLOL TARTRATE 25 MG PO TABS: 37.5000 mg | ORAL_TABLET | Freq: Two times a day (BID) | ORAL | Status: DC

## 2015-02-18 NOTE — Progress Notes (Signed)
Name: Jodi Bullock   MRN: [id number]    DOB: 1924-06-19   Date:02/18/2015       Progress Note  Subjective  Chief Complaint  Chief Complaint  Patient presents with  . Medication Problem    Discuss medication  . Hypertension   Hypertension This is a chronic problem. The problem has been gradually improving since onset. The problem is uncontrolled. Pertinent negatives include no chest pain, headaches, palpitations or shortness of breath. Past treatments include angiotensin blockers and beta blockers. Compliance problems include medication side effects (Amlodipine was stopped and her ankle swelling has since  improved.).  There is no history of kidney disease, CAD/MI or CVA.   Past Medical History  Diagnosis Date  . Cancer ([place]) 1998    breast  . Hypertension   . Breast cancer ([place])   . 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 Hypertension   . HLD (hyperlipidemia)   . Neuropathy of both feet ([place])   . GERD (gastroesophageal reflux disease)   . History of kidney stones   . Arthritis   . Depression     Past Surgical History  Procedure Laterality Date  . Abdominal hysterectomy    . Breast lumpectomy    . Joint replacement Right     Hip  . Cataract extraction    . Cholecystectomy    . Knee surgery Right     Family History  Problem Relation Age of Onset  . Stroke Mother   . Diabetes Father   . Heart disease Father   . Kidney disease Neg Hx   . Bladder Cancer Neg Hx     Social History   Social History  . Marital Status: Married    Spouse Name: N/A  . Number of Children: N/A  . Years of Education: N/A   Occupational History  . Not on file.   Social History Main Topics  . Smoking status: Former Research scientist (life sciences)  . Smokeless tobacco: Never Used     Comment: smoked in high school for a few years only  . Alcohol Use: No  . Drug Use: No  . Sexual Activity: Not Currently   Other Topics Concern  . Not on file   Social History Narrative     Current outpatient  prescriptions:  .  aspirin 81 MG tablet, Take 1 tablet by mouth daily., Disp: , Rfl:  .  Cholecalciferol 2000 UNITS TABS, Take 1 tablet by mouth daily., Disp: , Rfl:  .  LORazepam (ATIVAN) 0.5 MG tablet, Take 1 tablet (0.5 mg total) by mouth 2 (two) times daily as needed for anxiety., Disp: 60 tablet, Rfl: 0 .  losartan (COZAAR) 100 MG tablet, Take 1 tablet (100 mg total) by mouth daily., Disp: 30 tablet, Rfl: 3 .  lovastatin (MEVACOR) 40 MG tablet, Take 1 tablet (40 mg total) by mouth daily., Disp: 30 tablet, Rfl: 3 .  metoprolol tartrate (LOPRESSOR) 25 MG tablet, Take 0.5 tablets (12.5 mg total) by mouth 2 (two) times daily., Disp: 30 tablet, Rfl: 3 .  Multiple Vitamins-Minerals (PRESERVISION/LUTEIN) CAPS, Take 1 each by mouth 2 (two) times daily., Disp: , Rfl:  .  niacin (NIASPAN) 1000 MG CR tablet, Take 1 tablet (1,000 mg total) by mouth at bedtime., Disp: 30 tablet, Rfl: 3 .  oxybutynin (DITROPAN) 5 MG tablet, , Disp: , Rfl: 2 .  Polyethylene Glycol 1450 LIQD, POLYETHYLENE GLYCOL (Powder) - Historical Medication  17 grams daily Active, Disp: , Rfl:  .  traMADol (  ULTRAM) 50 MG tablet, Take 1 tablet (50 mg total) by mouth 2 (two) times daily., Disp: 60 tablet, Rfl: 2 .  mirabegron ER (MYRBETRIQ) 25 MG TB24 tablet, Take 25 mg by mouth daily., Disp: , Rfl:   Allergies  Allergen Reactions  . 2,4-D Dimethylamine (Amisol) Other (See Comments)    Other Reaction: OTHER REACTION-IRRITATION TO Esophagous  . Celebrex [Celecoxib]     Esophageal spasms    . Ciprofloxacin   . Ibuprofen   . Metronidazole   . Other Other (See Comments)    Other Reaction: esophagitis   Review of Systems  Respiratory: Negative for shortness of breath.   Cardiovascular: Negative for chest pain and palpitations.  Neurological: Negative for headaches.    Objective  Filed Vitals:   02/18/15 1111  BP: 148/78  Pulse: 77  Temp: 98.1 F (36.7 C)  TempSrc: Oral  Resp: 18  Height: 5\' 6"  (1.676 m)  Weight: 170 lb  6.4 oz (77.293 kg)  SpO2: 94%    Physical Exam  Constitutional: She is well-developed, well-nourished, and in no distress.  HENT:  Head: Normocephalic and atraumatic.  Cardiovascular: Normal rate and regular rhythm.   Pulmonary/Chest: Effort normal and breath sounds normal.  Musculoskeletal: She exhibits edema.       Right ankle: She exhibits swelling.       Left ankle: She exhibits swelling.  Nursing note and vitals reviewed.  Assessment & Plan  1. Essential hypertension Ankle swelling has significantly improved but blood pressure is not at goal.We will increase metoprolol to 37.5 mg 2 times daily. Continue on losartan 100 mg daily and recheck in one month. - metoprolol tartrate (LOPRESSOR) 25 MG tablet; Take 1.5 tablets (37.5 mg total) by mouth 2 (two) times daily.  Dispense: 90 tablet; Refill: 2   Amador Braddy [facility] 02/18/2015 11:33 AM

## 2015-02-19 ENCOUNTER — Ambulatory Visit: Payer: Medicare Other | Admitting: Obstetrics and Gynecology

## 2015-02-26 ENCOUNTER — Ambulatory Visit (INDEPENDENT_AMBULATORY_CARE_PROVIDER_SITE_OTHER): Payer: Medicare Other | Admitting: Obstetrics and Gynecology

## 2015-02-26 ENCOUNTER — Encounter: Payer: Self-pay | Admitting: Obstetrics and Gynecology

## 2015-02-26 VITALS — BP 143/69 | HR 66 | Ht 66.0 in | Wt 169.4 lb

## 2015-02-26 DIAGNOSIS — N3941 Urge incontinence: Secondary | ICD-10-CM

## 2015-02-26 LAB — PTNS-PERCUTANEOUS TIBIAL NERVE STIMULATION: Scan Result: 4

## 2015-02-26 NOTE — Progress Notes (Signed)
HPI: Patient is an 81 year old white female who presents today for her 3rd of 12 weekly PTNS treatment for urge incontinence.   Previous history Patient's urinary symptoms began years ago. Her amount of leakage varies. She states that she has leakage at least 3 times daily. She is making 6 trips during the day to urinate. She is up 4-5 times at night to urinate. She has pain and burning with urination. She has no awareness to the need to urinate, she leaks right after she is aware often one to 2 minutes afterwards. She has difficulty getting to the toilet on time. She is having 3-4 incontinence episodes per day. She is wearing 3-4 heavy pads during the day and overnight depends.  She is unsure of her fluid intake during the day.   Previous Therapy: She has not had Behavioral Modification Techniques. She has been on oxybutynin, Vesicare and Detrol in the past. They did not provide relief. She has also been on Myrbetriq, but she suffered headaches with this therapy.     BP 143/69 mmHg  Pulse 66  Ht 5\' 6"  (1.676 m)  Wt 169 lb 6.4 oz (76.839 kg)  BMI 27.35 kg/m2  She does not have any contraindications present for PTNS, such as: Pacemaker, Implantable defibrillator, History of abnormal bleeding, History of neuropathies or nerve damage  Discussed with patient possible complications of procedure, such as discomfort, bleeding at insertion/stimulation site, procedure consent signed  After her last visit, she had 8 daytime voids (unchanged), 2 nighttime voids (unchanged) and 2 incontinence episodes (improvement).  Patient goals: Her goals for this therapy are to reduce urgency, urge incontinence, daytime frequency, enuresis and sleep through the night.   PTNS treatment: The needle electrode was inserted to the lower, and aspect of the patient's right leg. The surface electrode was placed on the inside arch of the foot on the treatment leg. The lead set was connected to the  stimulator, and the needle electrode clip was connected to the needle electrode. The stimulator that produces an adjustable electrical pulsatile and sacral nerve plexus via the tibial nerve was increased to a 4 until the patient received both a toe flex and sensory response.  Treatment Plan:  The electrode was removed and the patient's leg without difficulty. She will not to consume fluids before bed. She will return in 1 week for her 4th PTNS therapy.

## 2015-03-05 ENCOUNTER — Encounter: Payer: Self-pay | Admitting: Obstetrics and Gynecology

## 2015-03-05 ENCOUNTER — Ambulatory Visit (INDEPENDENT_AMBULATORY_CARE_PROVIDER_SITE_OTHER): Payer: Medicare Other | Admitting: Obstetrics and Gynecology

## 2015-03-05 VITALS — BP 151/71 | HR 66 | Ht 66.0 in | Wt 170.6 lb

## 2015-03-05 DIAGNOSIS — N3941 Urge incontinence: Secondary | ICD-10-CM

## 2015-03-05 LAB — PTNS-PERCUTANEOUS TIBIAL NERVE STIMULATION: SCAN RESULT: 3

## 2015-03-05 NOTE — Progress Notes (Signed)
HPI: Patient is an 81 year old white female who presents today for her 4th of 12 weekly PTNS treatment for urge incontinence.   Previous history Patient's urinary symptoms began years ago. Her amount of leakage varies. She states that she has leakage at least 3 times daily. She is making 6 trips during the day to urinate. She is up 4-5 times at night to urinate. She has pain and burning with urination. She has no awareness to the need to urinate, she leaks right after she is aware often one to 2 minutes afterwards. She has difficulty getting to the toilet on time. She is having 3-4 incontinence episodes per day. She is wearing 3-4 heavy pads during the day and overnight depends.  She is unsure of her fluid intake during the day.   Previous Therapy: She has not had Behavioral Modification Techniques. She has been on oxybutynin, Vesicare and Detrol in the past. They did not provide relief. She has also been on Myrbetriq, but she suffered headaches with this therapy.   BP 151/71 mmHg  Pulse 66  Ht 5\' 6"  (1.676 m)  Wt 170 lb 9.6 oz (77.384 kg)  BMI 27.55 kg/m2  She does not have any contraindications present for PTNS, such as: Pacemaker, Implantable defibrillator, History of abnormal bleeding, History of neuropathies or nerve damage  Discussed with patient possible complications of procedure, such as discomfort, bleeding at insertion/stimulation site, procedure consent signed  After her last visit, she had 7 daytime voids (improved), 2 nighttime voids (unchanged) and 0 incontinence episodes (improvement).  Patient goals: Her goals for this therapy are to reduce urgency, urge incontinence, daytime frequency, enuresis and sleep through the night.   PTNS treatment: The needle electrode was inserted to the lower, and aspect of the patient's left leg. The surface electrode was placed on the inside arch of the foot on the treatment leg. The lead set was connected to the  stimulator, and the needle electrode clip was connected to the needle electrode. The stimulator that produces an adjustable electrical pulsatile and sacral nerve plexus via the tibial nerve was increased to a 3 until the patient received both a toe flex and sensory response.  Treatment Plan:  The electrode was removed and the patient's leg without difficulty. She will not to consume fluids before bed. She will return in 1 week for her 5th PTNS therapy.

## 2015-03-12 ENCOUNTER — Encounter: Payer: Self-pay | Admitting: Obstetrics and Gynecology

## 2015-03-12 ENCOUNTER — Ambulatory Visit (INDEPENDENT_AMBULATORY_CARE_PROVIDER_SITE_OTHER): Payer: Medicare Other | Admitting: Obstetrics and Gynecology

## 2015-03-12 VITALS — BP 136/64 | HR 67 | Resp 16 | Ht 66.0 in | Wt 171.3 lb

## 2015-03-12 DIAGNOSIS — R32 Unspecified urinary incontinence: Secondary | ICD-10-CM

## 2015-03-12 LAB — PTNS-PERCUTANEOUS TIBIAL NERVE STIMULATION: SCAN RESULT: 1

## 2015-03-12 NOTE — Progress Notes (Signed)
PTNS  Session # 5  Health & Social Factors: [place] Caffeine: 0 Alcohol: 0 Daytime voids #per day: 6.5 Night-time voids #per night: 2 Urgency: Mild/Strong Incontinence Episodes #per day: 0 Ankle used: L Treatment Setting: 1 Feeling/ Response: Both   Preformed By: [name], FNP  Assistant: Golden Hurter, CMA  Follow Up: 03/19/15

## 2015-03-12 NOTE — Progress Notes (Signed)
HPI: Patient is an 81 year old white female who presents today for her 5th of 12 weekly PTNS treatment for urge incontinence.   Previous history Patient's urinary symptoms began years ago. Her amount of leakage varies. She states that she has leakage at least 3 times daily. She is making 6 trips during the day to urinate. She is up 4-5 times at night to urinate. She has pain and burning with urination. She has no awareness to the need to urinate, she leaks right after she is aware often one to 2 minutes afterwards. She has difficulty getting to the toilet on time. She is having 3-4 incontinence episodes per day. She is wearing 3-4 heavy pads during the day and overnight depends.  She is unsure of her fluid intake during the day.   Previous Therapy: She has not had Behavioral Modification Techniques. She has been on oxybutynin, Vesicare and Detrol in the past. They did not provide relief. She has also been on Myrbetriq, but she suffered headaches with this therapy.   BP 136/64 mmHg  Pulse 67  Resp 16  Ht 5\' 6"  (1.676 m)  Wt 171 lb 4.8 oz (77.701 kg)  BMI 27.66 kg/m2  She does not have any contraindications present for PTNS, such as: Pacemaker, Implantable defibrillator, History of abnormal bleeding, History of neuropathies or nerve damage  Discussed with patient possible complications of procedure, such as discomfort, bleeding at insertion/stimulation site, procedure consent signed  After her last visit, she had 6.5 daytime voids (improved), 2 nighttime voids (unchanged) and 0 incontinence episodes (improvement).  Patient goals: Her goals for this therapy are to reduce urgency, urge incontinence, daytime frequency, enuresis and sleep through the night.   PTNS treatment: The needle electrode was inserted to the lower, and aspect of the patient's left leg. The surface electrode was placed on the inside arch of the foot on the treatment leg. The lead set was connected to  the stimulator, and the needle electrode clip was connected to the needle electrode. The stimulator that produces an adjustable electrical pulsatile and sacral nerve plexus via the tibial nerve was increased to a 3 until the patient received both a toe flex and sensory response.  Treatment Plan:  The electrode was removed and the patient's leg without difficulty. She will not to consume fluids before bed. She will return in 1 week for her 6th PTNS therapy.

## 2015-03-17 ENCOUNTER — Ambulatory Visit (INDEPENDENT_AMBULATORY_CARE_PROVIDER_SITE_OTHER): Payer: Medicare Other | Admitting: Obstetrics and Gynecology

## 2015-03-17 ENCOUNTER — Encounter: Payer: Self-pay | Admitting: Obstetrics and Gynecology

## 2015-03-17 VITALS — BP 150/69 | HR 67 | Resp 18 | Ht 66.0 in | Wt 171.3 lb

## 2015-03-17 DIAGNOSIS — R32 Unspecified urinary incontinence: Secondary | ICD-10-CM

## 2015-03-17 LAB — PTNS-PERCUTANEOUS TIBIAL NERVE STIMULATION: Scan Result: 1

## 2015-03-17 NOTE — Progress Notes (Signed)
Expand All Collapse All   HPI: Patient is an 81 year old white female who presents today for her 6th of 12 weekly PTNS treatment for urge incontinence.   Previous history Patient's urinary symptoms began years ago. Her amount of leakage varies. She states that she has leakage at least 3 times daily. She is making 6 trips during the day to urinate. She is up 4-5 times at night to urinate. She has pain and burning with urination. She has no awareness to the need to urinate, she leaks right after she is aware often one to 2 minutes afterwards. She has difficulty getting to the toilet on time. She is having 3-4 incontinence episodes per day. She is wearing 3-4 heavy pads during the day and overnight depends.  She is unsure of her fluid intake during the day.   Previous Therapy: She has not had Behavioral Modification Techniques. She has been on oxybutynin, Vesicare and Detrol in the past. They did not provide relief. She has also been on Myrbetriq, but she suffered headaches with this therapy.   BP 150/69 mmHg  Pulse 67  Resp 18  Ht 5\' 6"  (1.676 m)  Wt 171 lb 4.8 oz (77.701 kg)  BMI 27.66 kg/m2  She does not have any contraindications present for PTNS, such as: Pacemaker, Implantable defibrillator, History of abnormal bleeding, History of neuropathies or nerve damage  Discussed with patient possible complications of procedure, such as discomfort, bleeding at insertion/stimulation site, procedure consent signed  After her last visit, she had 6.5 daytime voids (improved), 2 nighttime voids (unchanged) and 0 incontinence episodes (improvement).  Patient state that she has also noticed a decrease in urgency.  Patient goals: Her goals for this therapy are to reduce urgency, urge incontinence, daytime frequency, enuresis and sleep through the night.   PTNS treatment: The needle electrode was inserted to the lower, and aspect of the patient's left leg. The surface electrode  was placed on the inside arch of the foot on the treatment leg. The lead set was connected to the stimulator, and the needle electrode clip was connected to the needle electrode. The stimulator that produces an adjustable electrical pulsatile and sacral nerve plexus via the tibial nerve was increased to a 3 until the patient received both a toe flex and sensory response.  Treatment Plan:  The electrode was removed and the patient's leg without difficulty. She will not to consume fluids before bed. She will return in 1 week for her 7th PTNS therapy.

## 2015-03-17 NOTE — Progress Notes (Signed)
PTNS  Session # 6  Health & Social Factors: No Change Caffeine: 0 Alcohol: 0 Daytime voids #per day: 6.5 Night-time voids #per night: 2 Urgency: mild/strong Incontinence Episodes #per day: 0 Ankle used: Left  Treatment Setting: 1 Feeling/ Response: Both Comments: Thing 2  Preformed By: [name], FNP  Assistant: Golden Hurter, CMA  Follow Up: 03/24/2015

## 2015-03-22 ENCOUNTER — Other Ambulatory Visit: Payer: Self-pay

## 2015-03-22 MED ORDER — NIACIN ER (ANTIHYPERLIPIDEMIC) 1000 MG PO TBCR: 1000.0000 mg | EXTENDED_RELEASE_TABLET | Freq: Every day | ORAL | Status: DC

## 2015-03-22 MED ORDER — LOVASTATIN 40 MG PO TABS: 40.0000 mg | ORAL_TABLET | Freq: Every day | ORAL | Status: DC

## 2015-03-22 NOTE — Telephone Encounter (Signed)
Medication has been refilled and sent to [facility] 

## 2015-03-26 ENCOUNTER — Ambulatory Visit (INDEPENDENT_AMBULATORY_CARE_PROVIDER_SITE_OTHER): Payer: Medicare Other | Admitting: Obstetrics and Gynecology

## 2015-03-26 ENCOUNTER — Encounter: Payer: Self-pay | Admitting: Obstetrics and Gynecology

## 2015-03-26 VITALS — BP 154/76 | HR 68 | Resp 16 | Wt 172.4 lb

## 2015-03-26 DIAGNOSIS — R32 Unspecified urinary incontinence: Secondary | ICD-10-CM | POA: Diagnosis not present

## 2015-03-26 LAB — PTNS-PERCUTANEOUS TIBIAL NERVE STIMULATION: Scan Result: 1

## 2015-03-26 NOTE — Progress Notes (Signed)
HPI: Patient is an 81 year old white female who presents today for her 7th of 12 weekly PTNS treatment for urge incontinence.   Previous history Patient's urinary symptoms began years ago. Her amount of leakage varies. She states that she has leakage at least 3 times daily. She is making 6 trips during the day to urinate. She is up 4-5 times at night to urinate. She has pain and burning with urination. She has no awareness to the need to urinate, she leaks right after she is aware often one to 2 minutes afterwards. She has difficulty getting to the toilet on time. She is having 3-4 incontinence episodes per day. She is wearing 3-4 heavy pads during the day and overnight depends.  She is unsure of her fluid intake during the day.   Previous Therapy: She has not had Behavioral Modification Techniques. She has been on oxybutynin, Vesicare and Detrol in the past. They did not provide relief. She has also been on Myrbetriq, but she suffered headaches with this therapy.  BP 154/76 mmHg  Pulse 68  Resp 16  Wt 172 lb 6.4 oz (78.2 kg)  She does not have any contraindications present for PTNS, such as: Pacemaker, Implantable defibrillator, History of abnormal bleeding, History of neuropathies or nerve damage  Discussed with patient possible complications of procedure, such as discomfort, bleeding at insertion/stimulation site, procedure consent signed  After her last visit, she had 6.5 daytime voids (improved), 1 nighttime voids (improved) except for one night when she voided 5 times after having coffee and tea with dinner, and 0 incontinence episodes (improvement). Patient state that she has also noticed a decrease in urgency.  Patient goals: Her goals for this therapy are to reduce urgency, urge incontinence, daytime frequency, enuresis and sleep through the night.   PTNS treatment: The needle electrode was inserted to the lower, and aspect of the patient's left leg. The surface  electrode was placed on the inside arch of the foot on the treatment leg. The lead set was connected to the stimulator, and the needle electrode clip was connected to the needle electrode. The stimulator that produces an adjustable electrical pulsatile and sacral nerve plexus via the tibial nerve was increased to a 3 until the patient received both a toe flex and sensory response.  Treatment Plan:  The electrode was removed and the patient's leg without difficulty. She will not to consume fluids before bed. She will return in 1 week for her 8th PTNS therapy.

## 2015-03-26 NOTE — Progress Notes (Signed)
PTNS  Session # 7  Health & Social Factors: No Change Caffeine: 0 Alcohol: 0 Daytime voids #per day: 5 Night-time voids #per night: 3 Urgency: Mild Incontinence Episodes #per day: 0 Ankle used: Left Treatment Setting: 1 Feeling/ Response: Toe flex Comments: Thing #2  Preformed By: [name], FNP  Assistant: Golden Hurter, CMA  Follow Up: 1 week

## 2015-04-02 ENCOUNTER — Ambulatory Visit (INDEPENDENT_AMBULATORY_CARE_PROVIDER_SITE_OTHER): Payer: Medicare Other | Admitting: Obstetrics and Gynecology

## 2015-04-02 ENCOUNTER — Encounter: Payer: Self-pay | Admitting: Family Medicine

## 2015-04-02 ENCOUNTER — Ambulatory Visit (INDEPENDENT_AMBULATORY_CARE_PROVIDER_SITE_OTHER): Payer: Medicare Other | Admitting: Family Medicine

## 2015-04-02 ENCOUNTER — Encounter: Payer: Self-pay | Admitting: Obstetrics and Gynecology

## 2015-04-02 VITALS — BP 123/62 | HR 69 | Ht 66.0 in | Wt 171.7 lb

## 2015-04-02 VITALS — BP 150/71 | HR 68 | Temp 98.4°F | Resp 18 | Ht 66.0 in | Wt 171.5 lb

## 2015-04-02 DIAGNOSIS — F419 Anxiety disorder, unspecified: Secondary | ICD-10-CM | POA: Diagnosis not present

## 2015-04-02 DIAGNOSIS — I10 Essential (primary) hypertension: Secondary | ICD-10-CM | POA: Diagnosis not present

## 2015-04-02 DIAGNOSIS — N3941 Urge incontinence: Secondary | ICD-10-CM

## 2015-04-02 LAB — PTNS-PERCUTANEOUS TIBIAL NERVE STIMULATION: SCAN RESULT: 6

## 2015-04-02 MED ORDER — METOPROLOL TARTRATE 50 MG PO TABS: 50.0000 mg | ORAL_TABLET | Freq: Two times a day (BID) | ORAL | Status: DC

## 2015-04-02 MED ORDER — LORAZEPAM 0.5 MG PO TABS: 0.5000 mg | ORAL_TABLET | Freq: Two times a day (BID) | ORAL | Status: DC | PRN

## 2015-04-02 MED ORDER — LOSARTAN POTASSIUM 100 MG PO TABS: 100.0000 mg | ORAL_TABLET | Freq: Every day | ORAL | Status: DC

## 2015-04-02 NOTE — Progress Notes (Signed)
Patient is an 80 year old white female who presents today for her 8th of 12 weekly PTNS treatment for urge incontinence.   Previous history Patient's urinary symptoms began years ago. Her amount of leakage varies. She states that she has leakage at least 3 times daily. She is making 6 trips during the day to urinate. She is up 4-5 times at night to urinate. She has pain and burning with urination. She has no awareness to the need to urinate, she leaks right after she is aware often one to 2 minutes afterwards. She has difficulty getting to the toilet on time. She is having 3-4 incontinence episodes per day. She is wearing 3-4 heavy pads during the day and overnight depends.  She is unsure of her fluid intake during the day.   Previous Therapy: She has not had Behavioral Modification Techniques. She has been on oxybutynin, Vesicare and Detrol in the past. They did not provide relief. She has also been on Myrbetriq, but she suffered headaches with this therapy.  BP 123/62 mmHg  Pulse 69  Ht 5\' 6"  (1.676 m)  Wt 171 lb 11.2 oz (77.883 kg)  BMI 27.73 kg/m2  She does not have any contraindications present for PTNS, such as: Pacemaker, Implantable defibrillator, History of abnormal bleeding, History of neuropathies or nerve damage  Discussed with patient possible complications of procedure, such as discomfort, bleeding at insertion/stimulation site, procedure consent signed  After her last visit, she had 5 daytime voids (improved), 1 nighttime voids (improved), and 0 incontinence episodes (improvement). Patient state that she has also noticed a further decrease in urgency.  Patient goals: Her goals for this therapy are to reduce urgency, urge incontinence, daytime frequency, enuresis and sleep through the night.   PTNS treatment: The needle electrode was inserted to the lower, and aspect of the patient's left leg. The surface electrode was placed on the inside arch of the foot  on the treatment leg. The lead set was connected to the stimulator, and the needle electrode clip was connected to the needle electrode. The stimulator that produces an adjustable electrical pulsatile and sacral nerve plexus via the tibial nerve was increased to a 6 until the patient received both a toe flex and sensory response.  Treatment Plan:  The electrode was removed and the patient's leg without difficulty. She will not to consume fluids before bed. She will return in 1 week for her 9th PTNS therapy.

## 2015-04-02 NOTE — Progress Notes (Signed)
Name: Jodi Bullock   MRN: [id number]    DOB: 1925/05/10   Date:04/02/2015       Progress Note  Subjective  Chief Complaint  Chief Complaint  Patient presents with  . Follow-up    3 mo  . Hyperlipidemia  . Hypertension  . Medication Refill    lorazepam 0.5mg  / losartan 100mg    Hypertension This is a chronic problem. The problem is unchanged. The problem is uncontrolled. Associated symptoms include anxiety and headaches. Pertinent negatives include no blurred vision or palpitations. Past treatments include beta blockers and angiotensin blockers. There is no history of kidney disease, CAD/MI or CVA.  Anxiety Presents for follow-up visit. Symptoms include depressed mood, excessive worry, insomnia, irritability, malaise and nervous/anxious behavior. Patient reports no palpitations. The severity of symptoms is moderate. The symptoms are aggravated by family issues. The quality of sleep is good.   Her past medical history is significant for anxiety/panic attacks and depression (pt. likely had undiagnosed and/or untreated depression for many years). Past treatments include benzodiazephines. The treatment provided significant relief. Compliance with prior treatments has been good.    Past Medical History  Diagnosis Date  . Cancer ([place]) 1998    breast  . Hypertension   . Breast cancer ([place])   . 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 Hypertension   . HLD (hyperlipidemia)   . Neuropathy of both feet ([place])   . GERD (gastroesophageal reflux disease)   . History of kidney stones   . Arthritis   . Depression     Past Surgical History  Procedure Laterality Date  . Abdominal hysterectomy    . Breast lumpectomy    . Joint replacement Right     Hip  . Cataract extraction    . Cholecystectomy    . Knee surgery Right     Family History  Problem Relation Age of Onset  . Stroke Mother   . Diabetes Father   . Heart disease Father   . Kidney disease Neg Hx   . Bladder Cancer  Neg Hx     Social History   Social History  . Marital Status: Married    Spouse Name: N/A  . Number of Children: N/A  . Years of Education: N/A   Occupational History  . Not on file.   Social History Main Topics  . Smoking status: Former Research scientist (life sciences)  . Smokeless tobacco: Never Used     Comment: smoked in high school for a few years only  . Alcohol Use: No  . Drug Use: No  . Sexual Activity: Not Currently   Other Topics Concern  . Not on file   Social History Narrative    Current outpatient prescriptions:  .  aspirin 81 MG tablet, Take 1 tablet by mouth daily., Disp: , Rfl:  .  Cholecalciferol 2000 UNITS TABS, Take 1 tablet by mouth daily., Disp: , Rfl:  .  fluticasone (FLONASE) 50 MCG/ACT nasal spray, Place 2 sprays into both nostrils at bedtime., Disp: , Rfl:  .  LORazepam (ATIVAN) 0.5 MG tablet, Take 1 tablet (0.5 mg total) by mouth 2 (two) times daily as needed for anxiety., Disp: 60 tablet, Rfl: 0 .  losartan (COZAAR) 100 MG tablet, Take 1 tablet (100 mg total) by mouth daily., Disp: 30 tablet, Rfl: 3 .  lovastatin (MEVACOR) 40 MG tablet, Take 1 tablet (40 mg total) by mouth daily., Disp: 30 tablet, Rfl: 3 .  metoprolol tartrate (LOPRESSOR) 25 MG tablet, Take  1.5 tablets (37.5 mg total) by mouth 2 (two) times daily., Disp: 90 tablet, Rfl: 2 .  Multiple Vitamins-Minerals (PRESERVISION/LUTEIN) CAPS, Take 1 each by mouth 2 (two) times daily., Disp: , Rfl:  .  niacin (NIASPAN) 1000 MG CR tablet, Take 1 tablet (1,000 mg total) by mouth at bedtime., Disp: 30 tablet, Rfl: 3 .  Polyethylene Glycol 1450 LIQD, POLYETHYLENE GLYCOL (Powder) - Historical Medication  17 grams daily Active, Disp: , Rfl:  .  traMADol (ULTRAM) 50 MG tablet, Take 1 tablet (50 mg total) by mouth 2 (two) times daily., Disp: 60 tablet, Rfl: 2  Allergies  Allergen Reactions  . 2,4-D Dimethylamine (Amisol) Other (See Comments)    Other Reaction: OTHER REACTION-IRRITATION TO Esophagous  . Celebrex [Celecoxib]      Esophageal spasms    . Ciprofloxacin   . Ibuprofen   . Metronidazole   . Other Other (See Comments)    Other Reaction: esophagitis    Review of Systems  Constitutional: Positive for irritability. Negative for fever and chills.  Eyes: Negative for blurred vision.  Cardiovascular: Negative for palpitations and leg swelling.  Neurological: Positive for headaches.  Psychiatric/Behavioral: Positive for depression. The patient is nervous/anxious and has insomnia.    Objective  Filed Vitals:   04/02/15 1025  BP: 150/71  Pulse: 68  Temp: 98.4 F (36.9 C)  TempSrc: Oral  Resp: 18  Height: 5\' 6"  (1.676 m)  Weight: 171 lb 8 oz (77.792 kg)  SpO2: 95%    Physical Exam  Constitutional: She is oriented to person, place, and time and well-developed, well-nourished, and in no distress.  HENT:  Head: Normocephalic and atraumatic.  Cardiovascular: Normal rate and regular rhythm.   Pulmonary/Chest: Effort normal and breath sounds normal.  Abdominal: Soft. Bowel sounds are normal. There is no tenderness.  Musculoskeletal: She exhibits edema and tenderness.  Neurological: She is alert and oriented to person, place, and time.  Skin: Skin is warm and dry.  Psychiatric: Mood, memory, affect and judgment normal.  Nursing note and vitals reviewed.  Assessment & Plan  1. Essential hypertension BP persistently elevated. We will increase metoprolol to 50 mg twice a day. Continue on losartan and follow-up in 2 months. - metoprolol tartrate (LOPRESSOR) 50 MG tablet; Take 1 tablet (50 mg total) by mouth 2 (two) times daily.  Dispense: 60 tablet; Refill: 2 - losartan (COZAAR) 100 MG tablet; Take 1 tablet (100 mg total) by mouth daily.  Dispense: 30 tablet; Refill: 3  2. Anxiety Lorazepam taken to relieve anxiety. Patient compliant with therapy. Refills provided. - LORazepam (ATIVAN) 0.5 MG tablet; Take 1 tablet (0.5 mg total) by mouth 2 (two) times daily as needed for anxiety.  Dispense: 60  tablet; Refill: 2   Theoren Palka [facility] 04/02/2015 10:37 AM

## 2015-04-07 ENCOUNTER — Encounter: Payer: Self-pay | Admitting: Obstetrics and Gynecology

## 2015-04-07 ENCOUNTER — Ambulatory Visit (INDEPENDENT_AMBULATORY_CARE_PROVIDER_SITE_OTHER): Payer: Medicare Other | Admitting: Obstetrics and Gynecology

## 2015-04-07 VITALS — BP 168/75 | HR 61 | Resp 16 | Ht 66.0 in | Wt 170.5 lb

## 2015-04-07 DIAGNOSIS — R32 Unspecified urinary incontinence: Secondary | ICD-10-CM

## 2015-04-07 LAB — PTNS-PERCUTANEOUS TIBIAL NERVE STIMULATION: Scan Result: 4

## 2015-04-07 NOTE — Progress Notes (Signed)
Patient is an 81 year old white female who presents today for her 9th of 12 weekly PTNS treatment for urge incontinence.   Previous history Patient's urinary symptoms began years ago. Her amount of leakage varies. She states that she has leakage at least 3 times daily. She is making 6 trips during the day to urinate. She is up 4-5 times at night to urinate. She has pain and burning with urination. She has no awareness to the need to urinate, she leaks right after she is aware often one to 2 minutes afterwards. She has difficulty getting to the toilet on time. She is having 3-4 incontinence episodes per day. She is wearing 3-4 heavy pads during the day and overnight depends.  She is unsure of her fluid intake during the day.   Previous Therapy: She has not had Behavioral Modification Techniques. She has been on oxybutynin, Vesicare and Detrol in the past. They did not provide relief. She has also been on Myrbetriq, but she suffered headaches with this therapy.  BP 168/75 mmHg  Pulse 61  Resp 16  Ht 5\' 6"  (1.676 m)  Wt 170 lb 8 oz (77.338 kg)  BMI 27.53 kg/m2  She does not have any contraindications present for PTNS, such as: Pacemaker, Implantable defibrillator, History of abnormal bleeding, History of neuropathies or nerve damage  Discussed with patient possible complications of procedure, such as discomfort, bleeding at insertion/stimulation site, procedure consent signed  After her last visit, she had 5 daytime voids (improved), 2.5 nighttime voids (improved), and 1-2 incontinence episodes (worse). Patient state that she has also noticed a further decrease in urgency.  She did experience an episode of incontinence while she was waiting at the doctors office with her husband but this was out of the ordinary.   Patient goals: Her goals for this therapy are to reduce urgency, urge incontinence, daytime frequency, enuresis and sleep through the night.   PTNS treatment: The  needle electrode was inserted to the lower, and aspect of the patient's left leg. The surface electrode was placed on the inside arch of the foot on the treatment leg. The lead set was connected to the stimulator, and the needle electrode clip was connected to the needle electrode. The stimulator that produces an adjustable electrical pulsatile and sacral nerve plexus via the tibial nerve was increased to a 4 until the patient received both a toe flex and sensory response.  Treatment Plan:  The electrode was removed and the patient's leg without difficulty. She will not to consume fluids before bed. She will return in 1 week for her 10th PTNS therapy.

## 2015-04-07 NOTE — Progress Notes (Signed)
PTNS  Session # 9  Health & Social Factors: No Change Caffeine: 0 Alcohol: 0 Daytime voids #per day: 6 Night-time voids #per night: 2.5 Urgency: Mild Incontinence Episodes #per day: 1 Ankle used: Left Treatment Setting: 4 Feeling/ Response: Both Comments: Thing 2  Preformed By: [name], FNP  Assistant: Golden Hurter, CMA

## 2015-04-16 ENCOUNTER — Ambulatory Visit (INDEPENDENT_AMBULATORY_CARE_PROVIDER_SITE_OTHER): Payer: Medicare Other | Admitting: Obstetrics and Gynecology

## 2015-04-16 ENCOUNTER — Encounter: Payer: Self-pay | Admitting: Obstetrics and Gynecology

## 2015-04-16 VITALS — BP 120/68 | HR 76 | Resp 16 | Ht 66.0 in | Wt 169.6 lb

## 2015-04-16 DIAGNOSIS — R32 Unspecified urinary incontinence: Secondary | ICD-10-CM | POA: Diagnosis not present

## 2015-04-16 LAB — PTNS-PERCUTANEOUS TIBIAL NERVE STIMULATION: SCAN RESULT: 2

## 2015-04-16 NOTE — Progress Notes (Signed)
Patient is an 81 year old white female who presents today for her 10th of 12 weekly PTNS treatment for urge incontinence.   Previous history Patient's urinary symptoms began years ago. Her amount of leakage varies. She states that she has leakage at least 3 times daily. She is making 6 trips during the day to urinate. She is up 4-5 times at night to urinate. She has pain and burning with urination. She has no awareness to the need to urinate, she leaks right after she is aware often one to 2 minutes afterwards. She has difficulty getting to the toilet on time. She is having 3-4 incontinence episodes per day. She is wearing 3-4 heavy pads during the day and overnight depends.  She is unsure of her fluid intake during the day.   Previous Therapy: She has not had Behavioral Modification Techniques. She has been on oxybutynin, Vesicare and Detrol in the past. They did not provide relief. She has also been on Myrbetriq, but she suffered headaches with this therapy.  BP 120/68 mmHg  Pulse 76  Resp 16  Ht 5\' 6"  (1.676 m)  Wt 169 lb 9.6 oz (76.93 kg)  BMI 27.39 kg/m2  She does not have any contraindications present for PTNS, such as: Pacemaker, Implantable defibrillator, History of abnormal bleeding, History of neuropathies or nerve damage  Discussed with patient possible complications of procedure, such as discomfort, bleeding at insertion/stimulation site, procedure consent signed  After her last visit, she had 5 daytime voids (improved), 2 nighttime voids (improved), and 1-2 incontinence episodes (worse). Patient state that she has also noticed a further decrease in urgency. She did experience an episode of incontinence while she was waiting at the doctors office with her husband but this was out of the ordinary.   Patient goals: Her goals for this therapy are to reduce urgency, urge incontinence, daytime frequency, enuresis and sleep through the night.   PTNS treatment: The  needle electrode was inserted to the lower, and aspect of the patient's left leg. The surface electrode was placed on the inside arch of the foot on the treatment leg. The lead set was connected to the stimulator, and the needle electrode clip was connected to the needle electrode. The stimulator that produces an adjustable electrical pulsatile and sacral nerve plexus via the tibial nerve was increased to a 4 until the patient received both a toe flex.  Treatment Plan:  The electrode was removed and the patient's leg without difficulty. She will not to consume fluids before bed. She will return in 1 week for her 11th PTNS therapy.

## 2015-04-16 NOTE — Progress Notes (Signed)
PTNS  Session # 10  Health & Social Factors: No Change Caffeine: 0 Alcohol: 0 Daytime voids #per day: 6 Night-time voids #per night: 2 Urgency: 0 Incontinence Episodes #per day: 0 Ankle used: Left Treatment Setting: 2 Feeling/ Response: Toe flex Comments: Thing #2  Preformed By: [name], FNP  Assistant: Golden Hurter, CMA  Follow Up: 04/23/15

## 2015-04-20 ENCOUNTER — Emergency Department: Payer: Medicare Other

## 2015-04-20 ENCOUNTER — Emergency Department
Admission: EM | Admit: 2015-04-20 | Discharge: 2015-04-20 | Disposition: A | Discharge status: Home / Self Care | Payer: Medicare Other | Attending: Emergency Medicine | Admitting: Emergency Medicine

## 2015-04-20 DIAGNOSIS — S60211A Contusion of right wrist, initial encounter: Secondary | ICD-10-CM

## 2015-04-20 DIAGNOSIS — Y92480 Sidewalk as the place of occurrence of the external cause: Secondary | ICD-10-CM | POA: Diagnosis not present

## 2015-04-20 DIAGNOSIS — Y998 Other external cause status: Secondary | ICD-10-CM | POA: Insufficient documentation

## 2015-04-20 DIAGNOSIS — Z79899 Other long term (current) drug therapy: Secondary | ICD-10-CM | POA: Insufficient documentation

## 2015-04-20 DIAGNOSIS — S0993XA Unspecified injury of face, initial encounter: Secondary | ICD-10-CM | POA: Diagnosis present

## 2015-04-20 DIAGNOSIS — Z23 Encounter for immunization: Secondary | ICD-10-CM | POA: Diagnosis not present

## 2015-04-20 DIAGNOSIS — W19XXXA Unspecified fall, initial encounter: Secondary | ICD-10-CM

## 2015-04-20 DIAGNOSIS — Z87891 Personal history of nicotine dependence: Secondary | ICD-10-CM | POA: Diagnosis not present

## 2015-04-20 DIAGNOSIS — I10 Essential (primary) hypertension: Secondary | ICD-10-CM | POA: Diagnosis not present

## 2015-04-20 DIAGNOSIS — Y9301 Activity, walking, marching and hiking: Secondary | ICD-10-CM | POA: Diagnosis not present

## 2015-04-20 DIAGNOSIS — Z7951 Long term (current) use of inhaled steroids: Secondary | ICD-10-CM | POA: Insufficient documentation

## 2015-04-20 DIAGNOSIS — Z7982 Long term (current) use of aspirin: Secondary | ICD-10-CM | POA: Insufficient documentation

## 2015-04-20 DIAGNOSIS — W01198A Fall on same level from slipping, tripping and stumbling with subsequent striking against other object, initial encounter: Secondary | ICD-10-CM | POA: Insufficient documentation

## 2015-04-20 DIAGNOSIS — S01511A Laceration without foreign body of lip, initial encounter: Secondary | ICD-10-CM | POA: Insufficient documentation

## 2015-04-20 DIAGNOSIS — Z9181 History of falling: Secondary | ICD-10-CM

## 2015-04-20 MED ORDER — LIDOCAINE-EPINEPHRINE (PF) 1 %-1:200000 IJ SOLN
INTRAMUSCULAR | Status: AC
  Filled 2015-04-20: qty 30

## 2015-04-20 MED ORDER — LIDOCAINE-EPINEPHRINE (PF) 2 %-1:200000 IJ SOLN
10.0000 mL | Freq: Once | INTRAMUSCULAR | Status: DC
  Filled 2015-04-20: qty 10

## 2015-04-20 MED ORDER — AMOXICILLIN 875 MG PO TABS: 875.0000 mg | ORAL_TABLET | Freq: Two times a day (BID) | ORAL | Status: DC

## 2015-04-20 MED ORDER — TETANUS-DIPHTH-ACELL PERTUSSIS 5-2.5-18.5 LF-MCG/0.5 IM SUSP
0.5000 mL | Freq: Once | INTRAMUSCULAR | Status: AC
  Administered 2015-04-20: 0.5 mL via INTRAMUSCULAR
  Filled 2015-04-20: qty 0.5

## 2015-04-20 NOTE — ED Notes (Signed)
Pt states she tripped on the sidewalk today going to her doctor.. Pt has lac to the lower lip, pain to the right wrist with abrasion to right hand and face. Denies any other injury.Marland Kitchen

## 2015-04-20 NOTE — Discharge Instructions (Signed)
Mouth Laceration A mouth laceration is a deep cut in the lining of your mouth (mucosa). The laceration may extend into your lip or go all of the way through your mouth and cheek. Lacerations inside your mouth may involve your tongue, the insides of your cheeks, or the upper surface of your mouth (palate). Mouth lacerations may bleed a lot because your mouth has a very rich blood supply. Mouth lacerations may need to be repaired with stitches (sutures). CAUSES Any type of facial injury can cause a mouth laceration. Common causes include:  Getting hit in the mouth.  Being in a car accident. SYMPTOMS The most common sign of a mouth laceration is bleeding that fills the mouth. DIAGNOSIS Your health care provider can diagnose a mouth laceration by examining your mouth. Your mouth may need to be washed out (irrigated) with a sterile salt-water (saline) solution. Your health care provider may also have to remove any blood clots to determine how bad your injury is. You may need X-rays of the bones in your jaw or your face to rule out other injuries, such as dental injuries, facial fractures, or jaw fractures. TREATMENT Treatment depends on the location and severity of your injury. Small mouth lacerations may not need treatment if bleeding has stopped. You may need sutures if:  You have a tongue laceration.  Your mouth laceration is large or deep, or it continues to bleed. If sutures are necessary, your health care provider will use absorbable sutures that dissolve as your body heals. You may also receive antibiotic medicine or a tetanus shot. HOME CARE INSTRUCTIONS  Take medicines only as directed by your health care provider.  If you were prescribed an antibiotic medicine, finish all of it even if you start to feel better.  Eat as directed by your health care provider. You may only be able to drink liquids or eat soft foods for a few days.  Rinse your mouth with a warm, salt-water rinse 4-6  times per day or as directed by your health care provider. You can make a salt-water rinse by mixing one tsp of salt into two cups of warm water.  Do not poke the sutures with your tongue. Doing that can loosen them.  Check your wound every day for signs of infection. It is normal to have a white or gray patch over your wound while it heals. Watch for:  Redness.  Swelling.  Blood or pus.  Maintain regular oral hygiene, if possible. Gently brush your teeth with a soft, nylon-bristled toothbrush 2 times per day.  Keep all follow-up visits as directed by your health care provider. This is important. SEEK MEDICAL CARE IF:  You were given a tetanus shot and have swelling, severe pain, redness, or bleeding at the injection site.  You have a fever.  Your pain is not controlled with medicine.  You have redness, swelling, or pain at your wound that is getting worse.  You have fresh bleeding or pus coming from your wound.  The edges of your wound break open.  You develop swollen, tender glands in your throat. SEEK IMMEDIATE MEDICAL CARE IF:   Your face or the area under your jaw becomes swollen.  You have trouble breathing or swallowing.   This information is not intended to replace advice given to you by your health care provider. Make sure you discuss any questions you have with your health care provider.   Document Released: 05/01/2005 Document Revised: 09/15/2014 Document Reviewed: 04/22/2014 Elsevier Interactive Patient  Education 2016 [place].  Contusion A contusion is a deep bruise. Contusions are the result of a blunt injury to tissues and muscle fibers under the skin. The injury causes bleeding under the skin. The skin overlying the contusion may turn blue, purple, or yellow. Minor injuries will give you a painless contusion, but more severe contusions may stay painful and swollen for a few weeks.  CAUSES  This condition is usually caused by a blow, trauma, or direct  force to an area of the body. SYMPTOMS  Symptoms of this condition include:  Swelling of the injured area.  Pain and tenderness in the injured area.  Discoloration. The area may have redness and then turn blue, purple, or yellow. DIAGNOSIS  This condition is diagnosed based on a physical exam and medical history. An X-ray, CT scan, or MRI may be needed to determine if there are any associated injuries, such as broken bones (fractures). TREATMENT  Specific treatment for this condition depends on what area of the body was injured. In general, the best treatment for a contusion is resting, icing, applying pressure to (compression), and elevating the injured area. This is often called the RICE strategy. Over-the-counter anti-inflammatory medicines may also be recommended for pain control.  HOME CARE INSTRUCTIONS   Rest the injured area.  If directed, apply ice to the injured area:  Put ice in a plastic bag.  Place a towel between your skin and the bag.  Leave the ice on for 20 minutes, 2-3 times per day.  If directed, apply light compression to the injured area using an elastic bandage. Make sure the bandage is not wrapped too tightly. Remove and reapply the bandage as directed by your health care provider.  If possible, raise (elevate) the injured area above the level of your heart while you are sitting or lying down.  Take over-the-counter and prescription medicines only as told by your health care provider. SEEK MEDICAL CARE IF:  Your symptoms do not improve after several days of treatment.  Your symptoms get worse.  You have difficulty moving the injured area. SEEK IMMEDIATE MEDICAL CARE IF:   You have severe pain.  You have numbness in a hand or foot.  Your hand or foot turns pale or cold.   This information is not intended to replace advice given to you by your health care provider. Make sure you discuss any questions you have with your health care provider.     Document Released: 02/08/2005 Document Revised: 01/20/2015 Document Reviewed: 09/16/2014 Elsevier Interactive Patient Education [employer].

## 2015-04-20 NOTE — ED Notes (Signed)
Pt reports she tripped and fell on sidewalk at doctors office today.  Pt complaining of right wrist pain, laceration to bottom lip, abrasion to fingers on right hand.

## 2015-04-20 NOTE — ED Provider Notes (Signed)
[facility] Emergency Department Provider Note  ____________________________________________  Time seen: Approximately 1:39 PM  I have reviewed the triage vital signs and the nursing notes.   HISTORY  Chief Complaint Fall    HPI Jodi Bullock is a 81 y.o. female who presents to emergency department today status post a fall. She states that she was walking along the sidewalk at her doctor's office when her toe caught an uneven edge sending her falling forward. She states that she hit her chin, causing her to bite through her lip and caught most of her fall with her right hand/wrist. Patient states that she is having a aching/throbbing sensation in her right wrist. The pain to her lip is sharp. Patient states that she is normally on aspirin however, she has discontinued use 2 weeks prior. She is on no other blood thinners. She denies losing consciousness. She denies neck pain, chest pain, shortness of breath, nausea or vomiting, numbness or tingling.   Past Medical History  Diagnosis Date  . Cancer ([place]) 1998    breast  . Hypertension   . Breast cancer ([place])   . 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 Hypertension   . HLD (hyperlipidemia)   . Neuropathy of both feet ([place])   . GERD (gastroesophageal reflux disease)   . History of kidney stones   . Arthritis   . Depression     Patient Active Problem List   Diagnosis Date Noted  . Urge incontinence 02/14/2015  . Ankle edema 02/02/2015  . At risk for falling 01/25/2015  . Dizziness 01/25/2015  . Acid reflux 01/25/2015  . Big thyroid 01/25/2015  . Gravida 2 para 2 01/25/2015  . Personal history of malignant neoplasm of breast 01/25/2015  . Arthritis, degenerative 01/25/2015  . Parity 2 01/25/2015  . Peripheral blood vessel disorder ([place]) 01/25/2015  . Need for vaccination 01/25/2015  . Pain in rectum 01/25/2015  . Reflux 01/25/2015  . Screening for depression 01/25/2015  . Disease of  accessory sinus 01/25/2015  . Headache, temporal 01/25/2015  . 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 Disease of thyroid gland 01/25/2015  . Avitaminosis D 01/25/2015  . Family history of diabetes mellitus in father 12/31/2014  . Fasting hyperglycemia 12/31/2014  . Hypertension 12/01/2014  . HLD (hyperlipidemia) 12/01/2014  . Anxiety 12/01/2014  . Myofascial pain 12/01/2014  . Breast CA ([place]) 11/24/2014  . C. difficile colitis 11/24/2014  . Absence of bladder continence 11/24/2014  . Urgency of micturation 11/24/2014  . Common bile duct dilatation 10/20/2014  . Pancreatic cyst 10/20/2014  . Kidney stones 10/20/2014  . Neuritis or radiculitis due to rupture of lumbar intervertebral disc 06/15/2014  . Lumbar canal stenosis 06/15/2014  . Degenerative arthritis of lumbar spine 06/15/2014  . 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 Bloodgood disease 11/05/2012  . Arthritis of temporomandibular joint 06/24/2012  . Atypical chest pain 01/05/2012  . Breath shortness 12/04/2011  . Lung mass 11/15/2011  . Thyroid nodule 11/15/2011  . Age-related macular degeneration, dry 10/03/2011  . Disorder of peripheral nervous system ([place]) 10/03/2011  . Chronic rhinitis 03/03/2011  . Carotid artery narrowing 03/01/2011  . Polypharmacy 12/02/2010    Past Surgical History  Procedure Laterality Date  . Abdominal hysterectomy    . Breast lumpectomy    . Joint replacement Right     Hip  . Cataract extraction    . Cholecystectomy    . Knee surgery Right  Current Outpatient Rx  Name  Route  Sig  Dispense  Refill  . amoxicillin (AMOXIL) 875 MG tablet   Oral   Take 1 tablet (875 mg total) by mouth 2 (two) times daily.   14 tablet   0   . aspirin 81 MG tablet   Oral   Take 1 tablet by mouth daily.         . Cholecalciferol 2000 UNITS TABS   Oral   Take 1 tablet by mouth daily.         . fluticasone (FLONASE) 50 MCG/ACT nasal spray   Each Nare   Place 2 sprays into both  nostrils at bedtime.         Marland Kitchen LORazepam (ATIVAN) 0.5 MG tablet   Oral   Take 1 tablet (0.5 mg total) by mouth 2 (two) times daily as needed for anxiety.   60 tablet   2   . losartan (COZAAR) 100 MG tablet   Oral   Take 1 tablet (100 mg total) by mouth daily.   30 tablet   3   . lovastatin (MEVACOR) 40 MG tablet   Oral   Take 1 tablet (40 mg total) by mouth daily.   30 tablet   3   . metoprolol tartrate (LOPRESSOR) 50 MG tablet   Oral   Take 1 tablet (50 mg total) by mouth 2 (two) times daily.   60 tablet   2   . Multiple Vitamins-Minerals (PRESERVISION/LUTEIN) CAPS   Oral   Take 1 each by mouth 2 (two) times daily.         . niacin (NIASPAN) 1000 MG CR tablet   Oral   Take 1 tablet (1,000 mg total) by mouth at bedtime.   30 tablet   3   . Polyethylene Glycol 1450 LIQD      POLYETHYLENE GLYCOL (Powder) - Historical Medication  17 grams daily Active         . traMADol (ULTRAM) 50 MG tablet   Oral   Take 1 tablet (50 mg total) by mouth 2 (two) times daily.   60 tablet   2     Allergies 2,4-d dimethylamine (amisol); Celebrex; Ciprofloxacin; Ibuprofen; Metronidazole; and Other  Family History  Problem Relation Age of Onset  . Stroke Mother   . Diabetes Father   . Heart disease Father   . Kidney disease Neg Hx   . Bladder Cancer Neg Hx     Social History Social History  Substance Use Topics  . Smoking status: Former Research scientist (life sciences)  . Smokeless tobacco: Never Used     Comment: smoked in high school for a few years only  . Alcohol Use: No    Review of Systems Constitutional: No fever/chills Eyes: No visual changes. ENT: No sore throat. Cardiovascular: Denies chest pain. Respiratory: Denies shortness of breath. Gastrointestinal: No abdominal pain.  No nausea, no vomiting.  No diarrhea.  No constipation. Genitourinary: Negative for dysuria. Musculoskeletal: Negative for back pain. Endorses right wrist pain. Skin: Negative for rash. Lip  laceration. Neurological: Negative for headaches, focal weakness or numbness.  10-point ROS otherwise negative.  ____________________________________________   PHYSICAL EXAM:  VITAL SIGNS: ED Triage Vitals  Enc Vitals Group     BP 04/20/15 1255 177/71 mmHg     Pulse Rate 04/20/15 1255 68     Resp 04/20/15 1255 18     Temp 04/20/15 1255 97.5 F (36.4 C)     Temp Source 04/20/15 1255 Oral  SpO2 04/20/15 1255 95 %     Weight 04/20/15 1255 167 lb (75.751 kg)     Height 04/20/15 1255 5\' 6"  (1.676 m)     Head Cir --      Peak Flow --      Pain Score 04/20/15 1256 3     Pain Loc --      Pain Edu? --      Excl. in [place]? --     Constitutional: Alert and oriented. Well appearing and in no acute distress. Eyes: Conjunctivae are normal. PERRL. EOMI. Head: Atraumatic. Nose: No congestion/rhinnorhea. Mouth/Throat: Mucous membranes are moist.  Oropharynx non-erythematous. Dentition intact. Laceration through the oral mucosa stretching from inner lip through the vermilion border. Laceration is approximately 2.5 cm. Laceration is not well approximated. There is missing tissue. No visible foreign body. Medial to this laceration there is a second laceration measuring 0.5 cm in length. Visible foreign body either laceration. Neck: No stridor.  No cervical spine tenderness to palpation. Cardiovascular: Normal rate, regular rhythm. Grossly normal heart sounds.  Good peripheral circulation. Respiratory: Normal respiratory effort.  No retractions. Lungs CTAB. Gastrointestinal: Soft and nontender. No distention. No abdominal bruits. No CVA tenderness. Musculoskeletal: No lower extremity tenderness nor edema.  No joint effusions. No visible deformity to right wrist when comparing with left. No edema, contusion, ecchymosis, abrasion, laceration noted. Patient is diffusely tender to palpation over distal ulna and radius. Full range of motion to wrist. Sensation and capillary refill are  intact. Neurologic:  Normal speech and language. No gross focal neurologic deficits are appreciated. No gait instability. Skin:  Skin is warm, dry and intact. No rash noted. Psychiatric: Mood and affect are normal. Speech and behavior are normal.  ____________________________________________   LABS (all labs ordered are listed, but only abnormal results are displayed)  Labs Reviewed - No data to display ____________________________________________  EKG   ____________________________________________  RADIOLOGY  Right wrist x-ray Impression: No acute abnormality. ____________________________________________   PROCEDURES  Procedure(s) performed: Yes, laceration repair 2, see procedure note(s).  LACERATION REPAIR Performed by: Darletta Moll Authorized by: Charline Bills [place] Consent: Verbal consent obtained. Risks and benefits: risks, benefits and alternatives were discussed Consent given by: patient Patient identity confirmed: provided demographic data Prepped and Draped in normal sterile fashion Wound explored  Laceration Location: Oral mucosa through vermilion border.  Laceration Length: 2.5 cm   No Foreign Bodies seen or palpated  Anesthesia: local infiltration  Local anesthetic: lidocaine 1 % with epinephrine  Anesthetic total: 5 ml  Irrigation method: syringe Amount of cleaning: standard  Skin closure: 5-0 Ethilon suture placed externally, 5-0 Vicryl suture placed and oral mucosa   Number of sutures: 1 suture in the vermilion border, Ethilon suture. 9 Vicryl sutures placed and oral mucosa.   Technique: Simple interrupted. Ethilon suture was placed to approximate the vermilion border. Vicryl sutures were placed to approximate edges of laceration through the oral mucosa. Edges were ragged, however, approximated well with sutures.   Patient tolerance: Patient tolerated the procedure well with no immediate complications.   LACERATION  REPAIR Performed by: Darletta Moll Authorized by: Charline Bills [place] Consent: Verbal consent obtained. Risks and benefits: risks, benefits and alternatives were discussed Consent given by: patient Patient identity confirmed: provided demographic data Prepped and Draped in normal sterile fashion Wound explored  Laceration Location: Oral mucosa  Laceration Length: 0.5 cm  No Foreign Bodies seen or palpated  Anesthesia: local infiltration  Local anesthetic: lidocaine 1 % with epinephrine  Anesthetic total: 1 ml  Irrigation method: syringe Amount of cleaning: standard  Skin closure: 5-0 Vicryl absorbable suture   Number of sutures: 2   Technique: Simple interrupted   Patient tolerance: Patient tolerated the procedure well with no immediate complications.   Critical Care performed: No  ____________________________________________   INITIAL IMPRESSION / ASSESSMENT AND PLAN / ED COURSE  Pertinent labs & imaging results that were available during my care of the patient were reviewed by me and considered in my medical decision making (see chart for details).  Patient's diagnosis is consistent with a fall causing too oral lacerations and a right wrist contusion. One laceration to the oral mucosa struck from oral mucosa across the vermilion border. This required 10 sutures for approximation. One nonabsorbable sutures placed to approximate the [place] border, 9 were placed in the oral mucosa. There was a second oral laceration requiring 2 absorbable sutures. Patient also has a diagnosis of right wrist contusion. Patient is to take Tylenol and ibuprofen at home for symptom control. Patient is instructed to follow up with primary care in 7-10 days for suture removal over the vermilion border. Patient given ED precautions to return for any increase in symptoms. Patient verbalizes understanding of diagnosis and verbalizes compliance with treatment  plan. ____________________________________________   FINAL CLINICAL IMPRESSION(S) / ED DIAGNOSES  Final diagnoses:  Fall, initial encounter  Laceration of lip, complicated, initial encounter  Wrist contusion, right, initial encounter  At risk for falling      Darletta Moll, PA-C 04/20/15 1528  Lavonia Drafts, MD 04/20/15 1541

## 2015-04-20 NOTE — ED Notes (Signed)
Pt unable to sign due to computer issues.

## 2015-04-23 ENCOUNTER — Encounter: Payer: Self-pay | Admitting: Obstetrics and Gynecology

## 2015-04-23 ENCOUNTER — Ambulatory Visit: Payer: Medicare Other | Admitting: Obstetrics and Gynecology

## 2015-04-23 ENCOUNTER — Ambulatory Visit (INDEPENDENT_AMBULATORY_CARE_PROVIDER_SITE_OTHER): Payer: Medicare Other | Admitting: Obstetrics and Gynecology

## 2015-04-23 VITALS — BP 137/65 | HR 80 | Resp 16 | Ht 66.0 in | Wt 167.8 lb

## 2015-04-23 DIAGNOSIS — R32 Unspecified urinary incontinence: Secondary | ICD-10-CM

## 2015-04-23 LAB — PTNS-PERCUTANEOUS TIBIAL NERVE STIMULATION: SCAN RESULT: 5

## 2015-04-23 NOTE — Progress Notes (Signed)
PTNS  Session # 11  Health & Social Factors: Fall on 04/20/15: 12 stitches in her mouth. Caffeine: 0 Alcohol: 0 Daytime voids #per day: 6 Night-time voids #per night: 2 Urgency: None Incontinence Episodes #per day: 0 Ankle used: Left Treatment Setting: 5 Feeling/ Response: Toe flex Comments: Thing 1  Preformed By: [name], FNP  Assistant: Golden Hurter, CMA  Follow Up: 04/30/15

## 2015-04-23 NOTE — Progress Notes (Signed)
Patient is an 81 year old white female who presents today for her 11th of 12 weekly PTNS treatment for urge incontinence.   Previous history Patient's urinary symptoms began years ago. Her amount of leakage varies. She states that she has leakage at least 3 times daily. She is making 6 trips during the day to urinate. She is up 4-5 times at night to urinate. She has pain and burning with urination. She has no awareness to the need to urinate, she leaks right after she is aware often one to 2 minutes afterwards. She has difficulty getting to the toilet on time. She is having 3-4 incontinence episodes per day. She is wearing 3-4 heavy pads during the day and overnight depends.  She is unsure of her fluid intake during the day.   Previous Therapy: She has not had Behavioral Modification Techniques. She has been on oxybutynin, Vesicare and Detrol in the past. They did not provide relief. She has also been on Myrbetriq, but she suffered headaches with this therapy.  BP 137/65 mmHg  Pulse 80  Resp 16  Ht 5\' 6"  (1.676 m)  Wt 167 lb 12.8 oz (76.114 kg)  BMI 27.10 kg/m2  She does not have any contraindications present for PTNS, such as: Pacemaker, Implantable defibrillator, History of abnormal bleeding, History of neuropathies or nerve damage  Discussed with patient possible complications of procedure, such as discomfort, bleeding at insertion/stimulation site, procedure consent signed  After her last visit, she had 5 daytime voids (improved), 2 nighttime voids (improved), and 1-2 incontinence episodes (worse). Patient state that she has also noticed a further decrease in urgency. She did experience an episode of incontinence while she was waiting at the doctors office with her husband but this was out of the ordinary.   Patient goals: Her goals for this therapy are to reduce urgency, urge incontinence, daytime frequency, enuresis and sleep through the night.   PTNS  treatment: The needle electrode was inserted to the lower, and aspect of the patient's left leg. The surface electrode was placed on the inside arch of the foot on the treatment leg. The lead set was connected to the stimulator, and the needle electrode clip was connected to the needle electrode. The stimulator that produces an adjustable electrical pulsatile and sacral nerve plexus via the tibial nerve was increased to a 5 until the patient received  a toe flex.  Treatment Plan:  The electrode was removed and the patient's leg without difficulty. She will not to consume fluids before bed. She will return in 1 week for her 12th PTNS therapy.

## 2015-04-30 ENCOUNTER — Ambulatory Visit (INDEPENDENT_AMBULATORY_CARE_PROVIDER_SITE_OTHER): Payer: Medicare Other | Admitting: Obstetrics and Gynecology

## 2015-04-30 ENCOUNTER — Encounter: Payer: Self-pay | Admitting: Emergency Medicine

## 2015-04-30 ENCOUNTER — Encounter: Payer: Self-pay | Admitting: Obstetrics and Gynecology

## 2015-04-30 ENCOUNTER — Emergency Department
Admission: EM | Admit: 2015-04-30 | Discharge: 2015-04-30 | Disposition: A | Discharge status: Home / Self Care | Payer: Medicare Other | Attending: Emergency Medicine | Admitting: Emergency Medicine

## 2015-04-30 VITALS — BP 120/66 | HR 72 | Resp 16 | Ht 66.0 in | Wt 168.4 lb

## 2015-04-30 DIAGNOSIS — R32 Unspecified urinary incontinence: Secondary | ICD-10-CM

## 2015-04-30 DIAGNOSIS — Z792 Long term (current) use of antibiotics: Secondary | ICD-10-CM | POA: Diagnosis not present

## 2015-04-30 DIAGNOSIS — Z7951 Long term (current) use of inhaled steroids: Secondary | ICD-10-CM | POA: Insufficient documentation

## 2015-04-30 DIAGNOSIS — Z79899 Other long term (current) drug therapy: Secondary | ICD-10-CM | POA: Diagnosis not present

## 2015-04-30 DIAGNOSIS — I10 Essential (primary) hypertension: Secondary | ICD-10-CM | POA: Insufficient documentation

## 2015-04-30 DIAGNOSIS — Z4802 Encounter for removal of sutures: Secondary | ICD-10-CM | POA: Diagnosis present

## 2015-04-30 DIAGNOSIS — Z7982 Long term (current) use of aspirin: Secondary | ICD-10-CM | POA: Diagnosis not present

## 2015-04-30 DIAGNOSIS — Z87891 Personal history of nicotine dependence: Secondary | ICD-10-CM | POA: Insufficient documentation

## 2015-04-30 LAB — PTNS-PERCUTANEOUS TIBIAL NERVE STIMULATION: Scan Result: 3

## 2015-04-30 NOTE — Progress Notes (Signed)
Patient is an 81 year old white female who presents today for lesion of her 12 weekly PTNS treatments for urge incontinence.   Previous history Patient's urinary symptoms began years ago. Her amount of leakage varies. She states that she has leakage at least 3 times daily. She is making 6 trips during the day to urinate. She is up 4-5 times at night to urinate. She has pain and burning with urination. She has no awareness to the need to urinate, she leaks right after she is aware often one to 2 minutes afterwards. She has difficulty getting to the toilet on time. She is having 3-4 incontinence episodes per day. She is wearing 3-4 heavy pads during the day and overnight depends.  She is unsure of her fluid intake during the day.   Previous Therapy: She has not had Behavioral Modification Techniques. She has been on oxybutynin, Vesicare and Detrol in the past. They did not provide relief. She has also been on Myrbetriq, but she suffered headaches with this therapy.  BP 120/66 mmHg  Pulse 72  Resp 16  Ht 5\' 6"  (1.676 m)  Wt 168 lb 6.4 oz (76.386 kg)  BMI 27.19 kg/m2  She does not have any contraindications present for PTNS, such as: Pacemaker, Implantable defibrillator, History of abnormal bleeding, History of neuropathies or nerve damage  Discussed with patient possible complications of procedure, such as discomfort, bleeding at insertion/stimulation site, procedure consent signed  After her last visit, she had 4 daytime voids (improved), 2 nighttime voids (unchanged), and 0 incontinence episodes (improved). Patient state that she has also noticed a further decrease in urgency. She did experience an episode of incontinence while she was waiting at the doctors office with her husband but this was out of the ordinary.   Patient goals: Her goals for this therapy are to reduce urgency, urge incontinence, daytime frequency, enuresis and sleep through the night.   PTNS  treatment: The needle electrode was inserted to the lower, and aspect of the patient's left leg. The surface electrode was placed on the inside arch of the foot on the treatment leg. The lead set was connected to the stimulator, and the needle electrode clip was connected to the needle electrode. The stimulator that produces an adjustable electrical pulsatile and sacral nerve plexus via the tibial nerve was increased to a 3 until the patient received  a toe flex.  Treatment Plan:  The electrode was removed and the patient's leg without difficulty. She will not to consume fluids before bed. She will return in 1 month for her PTNS monthly maintenance therapy.  Patient reports that overall she is very pleased with results of her PTNS therapy. Her urinary symptoms are significantly improved. She states that she no longer has to always be aware where the restroom is. She is no longer worried about having incontinence episodes in public.

## 2015-04-30 NOTE — Progress Notes (Signed)
PTNS  Session # 12  Health & Social Factors: No Change Caffeine: 0 Alcohol: 0 Daytime voids #per day: 5 Night-time voids #per night: 2 Urgency: 0 Incontinence Episodes #per day: 0 Ankle used: Left Treatment Setting: 3 Feeling/ Response: Toe flex Comments: Thing #2  Preformed By: [name], FNP  Assistant: Golden Hurter, CMA  Follow Up: Last treatment

## 2015-04-30 NOTE — Discharge Instructions (Signed)

## 2015-04-30 NOTE — ED Provider Notes (Signed)
[facility] Emergency Department Provider Note  ____________________________________________  Time seen: Approximately 10:53 AM  I have reviewed the triage vital signs and the nursing notes.   HISTORY  Chief Complaint No chief complaint on file.    HPI Lashaunte Tumbleson is a 81 y.o. female who presents for evaluation of suture removal from her lower lip. Patient denies any complaints at this time states that the lip is healing well.   Past Medical History  Diagnosis Date  . Cancer ([place]) 1998    breast  . Hypertension   . Breast cancer ([place])   . 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 Hypertension   . HLD (hyperlipidemia)   . Neuropathy of both feet ([place])   . GERD (gastroesophageal reflux disease)   . History of kidney stones   . Arthritis   . Depression     Patient Active Problem List   Diagnosis Date Noted  . Urge incontinence 02/14/2015  . Ankle edema 02/02/2015  . At risk for falling 01/25/2015  . Dizziness 01/25/2015  . Acid reflux 01/25/2015  . Big thyroid 01/25/2015  . Gravida 2 para 2 01/25/2015  . Personal history of malignant neoplasm of breast 01/25/2015  . Arthritis, degenerative 01/25/2015  . Parity 2 01/25/2015  . Peripheral blood vessel disorder ([place]) 01/25/2015  . Need for vaccination 01/25/2015  . Pain in rectum 01/25/2015  . Reflux 01/25/2015  . Screening for depression 01/25/2015  . Disease of accessory sinus 01/25/2015  . Headache, temporal 01/25/2015  . Primary cancer of thymus ([place]) 01/25/2015  . Disease of thyroid gland 01/25/2015  . Avitaminosis D 01/25/2015  . Family history of diabetes mellitus in father 12/31/2014  . Fasting hyperglycemia 12/31/2014  . Hypertension 12/01/2014  . HLD (hyperlipidemia) 12/01/2014  . Anxiety 12/01/2014  . Myofascial pain 12/01/2014  . Breast CA ([place]) 11/24/2014  . C. difficile colitis 11/24/2014  . Absence of bladder continence 11/24/2014  . Urgency of micturation  11/24/2014  . Common bile duct dilatation 10/20/2014  . Pancreatic cyst 10/20/2014  . Kidney stones 10/20/2014  . Neuritis or radiculitis due to rupture of lumbar intervertebral disc 06/15/2014  . Lumbar canal stenosis 06/15/2014  . Degenerative arthritis of lumbar spine 06/15/2014  . 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 Bloodgood disease 11/05/2012  . Arthritis of temporomandibular joint 06/24/2012  . Atypical chest pain 01/05/2012  . Breath shortness 12/04/2011  . Lung mass 11/15/2011  . Thyroid nodule 11/15/2011  . Age-related macular degeneration, dry 10/03/2011  . Disorder of peripheral nervous system ([place]) 10/03/2011  . Chronic rhinitis 03/03/2011  . Carotid artery narrowing 03/01/2011  . Polypharmacy 12/02/2010    Past Surgical History  Procedure Laterality Date  . Abdominal hysterectomy    . Breast lumpectomy    . Joint replacement Right     Hip  . Cataract extraction    . Cholecystectomy    . Knee surgery Right     Current Outpatient Rx  Name  Route  Sig  Dispense  Refill  . amoxicillin (AMOXIL) 875 MG tablet   Oral   Take 1 tablet (875 mg total) by mouth 2 (two) times daily.   14 tablet   0   . aspirin 81 MG tablet   Oral   Take 1 tablet by mouth daily.         . Cholecalciferol 2000 UNITS TABS   Oral   Take 1 tablet by mouth daily.         Marland Kitchen  fluticasone (FLONASE) 50 MCG/ACT nasal spray   Each Nare   Place 2 sprays into both nostrils at bedtime.         Marland Kitchen LORazepam (ATIVAN) 0.5 MG tablet   Oral   Take 1 tablet (0.5 mg total) by mouth 2 (two) times daily as needed for anxiety.   60 tablet   2   . losartan (COZAAR) 100 MG tablet   Oral   Take 1 tablet (100 mg total) by mouth daily.   30 tablet   3   . lovastatin (MEVACOR) 40 MG tablet   Oral   Take 1 tablet (40 mg total) by mouth daily.   30 tablet   3   . metoprolol (LOPRESSOR) 50 MG tablet            2   . Multiple Vitamins-Minerals (PRESERVISION/LUTEIN) CAPS   Oral    Take 1 each by mouth 2 (two) times daily.         . niacin (NIASPAN) 1000 MG CR tablet   Oral   Take 1 tablet (1,000 mg total) by mouth at bedtime.   30 tablet   3   . Polyethylene Glycol 1450 LIQD      POLYETHYLENE GLYCOL (Powder) - Historical Medication  17 grams daily Active         . traMADol (ULTRAM) 50 MG tablet   Oral   Take 1 tablet (50 mg total) by mouth 2 (two) times daily.   60 tablet   2     Allergies 2,4-d dimethylamine (amisol); Celebrex; Ciprofloxacin; Ibuprofen; Metronidazole; and Other  Family History  Problem Relation Age of Onset  . Stroke Mother   . Diabetes Father   . Heart disease Father   . Kidney disease Neg Hx   . Bladder Cancer Neg Hx     Social History Social History  Substance Use Topics  . Smoking status: Former Research scientist (life sciences)  . Smokeless tobacco: Never Used     Comment: smoked in high school for a few years only  . Alcohol Use: No    Review of Systems Constitutional: No fever/chills Eyes: No visual changes. ENT: Lower lip with healed laceration, suture intact. Cardiovascular: Denies chest pain. Respiratory: Denies shortness of breath. Gastrointestinal: No abdominal pain.  No nausea, no vomiting.  No diarrhea.  No constipation. Genitourinary: Negative for dysuria. Musculoskeletal: Negative for back pain. Skin: Negative for rash. Neurological: Negative for headaches, focal weakness or numbness.  10-point ROS otherwise negative.  ____________________________________________   PHYSICAL EXAM:  VITAL SIGNS: ED Triage Vitals  Enc Vitals Group     BP --      Pulse Rate 04/30/15 1047 71     Resp 04/30/15 1047 18     Temp 04/30/15 1047 97.6 F (36.4 C)     Temp Source 04/30/15 1047 Oral     SpO2 04/30/15 1047 96 %     Weight --      Height --      Head Cir --      Peak Flow --      Pain Score --      Pain Loc --      Pain Edu? --      Excl. in [place]? --     Constitutional: Alert and oriented. Well appearing and in no acute  distress. Head: Atraumatic. Mouth/Throat: Mucous membranes are moist.  Lower lip with suture intact well approximated edges no active bleeding. Neurologic:  Normal speech and language. No gross  focal neurologic deficits are appreciated. No gait instability. Skin:  Skin is warm, dry and intact. No rash noted. Psychiatric: Mood and affect are normal. Speech and behavior are normal.  ____________________________________________   LABS (all labs ordered are listed, but only abnormal results are displayed)  Labs Reviewed - No data to display ____________________________________________     PROCEDURES  Procedure(s) performed: None  Critical Care performed: No  ____________________________________________   INITIAL IMPRESSION / ASSESSMENT AND PLAN / ED COURSE  Pertinent labs & imaging results that were available during my care of the patient were reviewed by me and considered in my medical decision making (see chart for details).  Suture removal. Patient follow-up with her PCP as scheduled. ____________________________________________   FINAL CLINICAL IMPRESSION(S) / ED DIAGNOSES  Final diagnoses:  Visit for suture removal      Arlyss Repress, PA-C 04/30/15 1055  Eula Listen, MD 04/30/15 1523

## 2015-05-12 ENCOUNTER — Ambulatory Visit: Payer: Medicare Other | Admitting: Family Medicine

## 2015-05-18 ENCOUNTER — Telehealth: Payer: Self-pay | Admitting: Family Medicine

## 2015-05-18 NOTE — Telephone Encounter (Signed)
Pt states when she was here last week with her husband that Dr Manuella Ghazi wanted her to call today and speak to him. Pt would like a call on her cell [phone].

## 2015-05-19 NOTE — Telephone Encounter (Signed)
Patient was told to give you a call this week due to a spot on her lip from when she fell. The spot on her lip is hard and another spot is coming up beside it. She would like try to get this resolved before the bad weather comes in Friday and she knows that yo will be out of the office on Thursday.

## 2015-05-20 NOTE — Telephone Encounter (Signed)
Routed to Dr. Shah for advice  

## 2015-05-22 NOTE — Telephone Encounter (Signed)
Returned call and left a voice message. 

## 2015-05-26 ENCOUNTER — Ambulatory Visit (INDEPENDENT_AMBULATORY_CARE_PROVIDER_SITE_OTHER): Payer: Medicare Other | Admitting: Family Medicine

## 2015-05-26 ENCOUNTER — Encounter: Payer: Self-pay | Admitting: Family Medicine

## 2015-05-26 VITALS — BP 120/68 | HR 74 | Temp 98.2°F | Resp 16 | Ht 66.0 in | Wt 171.5 lb

## 2015-05-26 DIAGNOSIS — K1370 Unspecified lesions of oral mucosa: Secondary | ICD-10-CM | POA: Diagnosis not present

## 2015-05-26 NOTE — Progress Notes (Signed)
Name: Jodi Bullock   MRN: [id number]    DOB: Oct 09, 1924   Date:05/26/2015       Progress Note  Subjective  Chief Complaint  Chief Complaint  Patient presents with  . Mouth Injury    [place] [address] out in the parking lot in Dec.  had a laceraction on lip.  It has healed but feels like something is in it. Patient states sore and itches.  . Neck Pain    onset several days cannot turn her neck.    HPI  Swelling on the Lower Lip Pt. Reports swelling on the lower lip since she fell down on the ground last month (December 6th), and injured her lips and mouth. She was seen in the ER, had stitches put in her lower lip. She reports a knot like swelling in her lower lip since her stitches were put in and a smaller adjacent lesion appeared beside the original one. The lesion does not hurt but itches. Sore to touch, no bleeding, no discharge.   Past Medical History  Diagnosis Date  . Cancer ([place]) 1998    breast  . Hypertension   . Breast cancer ([place])   . 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 Hypertension   . HLD (hyperlipidemia)   . Neuropathy of both feet ([place])   . GERD (gastroesophageal reflux disease)   . History of kidney stones   . Arthritis   . Depression     Past Surgical History  Procedure Laterality Date  . Abdominal hysterectomy    . Breast lumpectomy    . Joint replacement Right     Hip  . Cataract extraction    . Cholecystectomy    . Knee surgery Right     Family History  Problem Relation Age of Onset  . Stroke Mother   . Diabetes Father   . Heart disease Father   . Kidney disease Neg Hx   . Bladder Cancer Neg Hx     Social History   Social History  . Marital Status: Married    Spouse Name: N/A  . Number of Children: N/A  . Years of Education: N/A   Occupational History  . Not on file.   Social History Main Topics  . Smoking status: Former Research scientist (life sciences)  . Smokeless tobacco: Never Used     Comment: smoked in high school for a few years only  . Alcohol  Use: No  . Drug Use: No  . Sexual Activity: Not Currently   Other Topics Concern  . Not on file   Social History Narrative     Current outpatient prescriptions:  .  aspirin 81 MG tablet, Take 1 tablet by mouth daily. Reported on 04/30/2015, Disp: , Rfl:  .  Cholecalciferol 2000 UNITS TABS, Take 1 tablet by mouth daily., Disp: , Rfl:  .  fluticasone (FLONASE) 50 MCG/ACT nasal spray, Place 2 sprays into both nostrils at bedtime., Disp: , Rfl:  .  LORazepam (ATIVAN) 0.5 MG tablet, Take 1 tablet (0.5 mg total) by mouth 2 (two) times daily as needed for anxiety., Disp: 60 tablet, Rfl: 2 .  losartan (COZAAR) 100 MG tablet, Take 1 tablet (100 mg total) by mouth daily., Disp: 30 tablet, Rfl: 3 .  lovastatin (MEVACOR) 40 MG tablet, Take 1 tablet (40 mg total) by mouth daily., Disp: 30 tablet, Rfl: 3 .  metoprolol (LOPRESSOR) 50 MG tablet, , Disp: , Rfl: 2 .  Multiple Vitamins-Minerals (PRESERVISION/LUTEIN) CAPS, Take 1 each by  mouth 2 (two) times daily., Disp: , Rfl:  .  niacin (NIASPAN) 1000 MG CR tablet, Take 1 tablet (1,000 mg total) by mouth at bedtime., Disp: 30 tablet, Rfl: 3 .  Polyethylene Glycol 1450 LIQD, POLYETHYLENE GLYCOL (Powder) - Historical Medication  17 grams daily Active, Disp: , Rfl:  .  traMADol (ULTRAM) 50 MG tablet, Take 1 tablet (50 mg total) by mouth 2 (two) times daily., Disp: 60 tablet, Rfl: 2  Allergies  Allergen Reactions  . 2,4-D Dimethylamine (Amisol) Other (See Comments)    Other Reaction: OTHER REACTION-IRRITATION TO Esophagous  . Celebrex [Celecoxib]     Esophageal spasms    . Ciprofloxacin   . Ibuprofen   . Metronidazole   . Other Other (See Comments)    Other Reaction: esophagitis     Review of Systems  Constitutional: Negative for fever, chills and weight loss.   Objective  Filed Vitals:   05/26/15 0938  BP: 120/68  Pulse: 74  Temp: 98.2 F (36.8 C)  TempSrc: Oral  Resp: 16  Height: 5\' 6"  (1.676 m)  Weight: 171 lb 8 oz (77.792 kg)   SpO2: 96%    Physical Exam  HENT:  Mouth/Throat: Oropharynx is clear and moist.  Nodular lesion on the lower lateral lip, no surrounding erythema, no discharge, mild tenderness to palpation. Smaller (<9mm) round shaped lesion on the right lower lip. No discharge visible.  Nursing note and vitals reviewed.   Assessment & Plan  1. Oral mucosal lesion Referral to ENT for evaluation of the oral mucosal lesions. - Ambulatory referral to ENT   Ramonia Mcclaran Asad A. [facility] 05/26/2015 9:56 AM

## 2015-05-28 ENCOUNTER — Encounter: Payer: Self-pay | Admitting: Obstetrics and Gynecology

## 2015-05-28 ENCOUNTER — Ambulatory Visit (INDEPENDENT_AMBULATORY_CARE_PROVIDER_SITE_OTHER): Payer: Medicare Other | Admitting: Obstetrics and Gynecology

## 2015-05-28 VITALS — BP 134/72 | HR 70 | Resp 16 | Ht 66.0 in | Wt 163.3 lb

## 2015-05-28 DIAGNOSIS — R32 Unspecified urinary incontinence: Secondary | ICD-10-CM | POA: Diagnosis not present

## 2015-05-28 LAB — PTNS-PERCUTANEOUS TIBIAL NERVE STIMULATION: SCAN RESULT: 4

## 2015-05-28 NOTE — Progress Notes (Signed)
Patient is an 82 year old white female who presents today for her q monthly PTNS treatment for urge incontinence.   Previous history Patient's urinary symptoms began years ago. Her amount of leakage varies. She states that she has leakage at least 3 times daily. She is making 6 trips during the day to urinate. She is up 4-5 times at night to urinate. She has pain and burning with urination. She has no awareness to the need to urinate, she leaks right after she is aware often one to 2 minutes afterwards. She has difficulty getting to the toilet on time. She is having 3-4 incontinence episodes per day. She is wearing 3-4 heavy pads during the day and overnight depends.  She is unsure of her fluid intake during the day.   Previous Therapy: She has not had Behavioral Modification Techniques. She has been on oxybutynin, Vesicare and Detrol in the past. They did not provide relief. She has also been on Myrbetriq, but she suffered headaches with this therapy.  BP 134/72 mmHg  Pulse 70  Resp 16  Ht 5\' 6"  (1.676 m)  Wt 163 lb 4.8 oz (74.072 kg)  BMI 26.37 kg/m2  She does not have any contraindications present for PTNS, such as: Pacemaker, Implantable defibrillator, History of abnormal bleeding, History of neuropathies or nerve damage  Discussed with patient possible complications of procedure, such as discomfort, bleeding at insertion/stimulation site, procedure consent signed  After her last visit, she had 4 daytime voids (improved), 2 nighttime voids (unchanged), and 0 incontinence episodes (improved). Patient state that she has also noticed a further decrease in urgency. She did experience an episode of incontinence while she was waiting at the doctors office with her husband but this was out of the ordinary.   Patient goals: Her goals for this therapy are to reduce urgency, urge incontinence, daytime frequency, enuresis and sleep through the night.   PTNS treatment: The  needle electrode was inserted to the lower, and aspect of the patient's left leg. The surface electrode was placed on the inside arch of the foot on the treatment leg. The lead set was connected to the stimulator, and the needle electrode clip was connected to the needle electrode. The stimulator that produces an adjustable electrical pulsatile and sacral nerve plexus via the tibial nerve was increased to a 4 until the patient received  a toe flex.  Treatment Plan:  The electrode was removed and the patient's leg without difficulty. She will not to consume fluids before bed. She will return in 1 month for her PTNS monthly maintenance therapy.  Patient reports that overall she is very pleased with results of her PTNS therapy. Her urinary symptoms are significantly improved. She states that she no longer has to always be aware where the restroom is. She is no longer worried about having incontinence episodes in public.

## 2015-05-28 NOTE — Progress Notes (Signed)
PTNS  Session # Maintenance 1  Health & Social Factors: No change Caffeine: 0 Alcohol: 0 Daytime voids #per day: 5 Night-time voids #per night: 2 Urgency: 0 Incontinence Episodes #per day: 0 Ankle used: Left Treatment Setting: 4 Feeling/ Response: Toe flex Comments: Thing #1  Preformed By: [name], FNP  Assistant: Golden Hurter, CMA  Follow Up: 1 month

## 2015-06-02 ENCOUNTER — Ambulatory Visit: Payer: Medicare Other | Admitting: Family Medicine

## 2015-06-03 ENCOUNTER — Encounter: Payer: Self-pay | Admitting: Family Medicine

## 2015-06-03 ENCOUNTER — Ambulatory Visit (INDEPENDENT_AMBULATORY_CARE_PROVIDER_SITE_OTHER): Payer: Medicare Other | Admitting: Family Medicine

## 2015-06-03 VITALS — BP 138/76 | HR 65 | Temp 97.9°F | Resp 16 | Ht 66.0 in | Wt 169.5 lb

## 2015-06-03 DIAGNOSIS — E785 Hyperlipidemia, unspecified: Secondary | ICD-10-CM | POA: Insufficient documentation

## 2015-06-03 DIAGNOSIS — J0100 Acute maxillary sinusitis, unspecified: Secondary | ICD-10-CM

## 2015-06-03 DIAGNOSIS — J0101 Acute recurrent maxillary sinusitis: Secondary | ICD-10-CM | POA: Insufficient documentation

## 2015-06-03 DIAGNOSIS — I10 Essential (primary) hypertension: Secondary | ICD-10-CM | POA: Diagnosis not present

## 2015-06-03 MED ORDER — AMOXICILLIN-POT CLAVULANATE 875-125 MG PO TABS: 1.0000 | ORAL_TABLET | Freq: Two times a day (BID) | ORAL | Status: DC

## 2015-06-03 NOTE — Progress Notes (Signed)
Name: Jodi Bullock   MRN: [id number]    DOB: 05-08-1925   Date:06/03/2015       Progress Note  Subjective  Chief Complaint  Chief Complaint  Patient presents with  . Medication Refill    follow-up  . Hypertension    wants to stop nician due to skin flushes and worsening  . Hyperlipidemia    needs to change chol med due to ins.  . Anxiety  . URI    chronic headaches, facial pressure and pain    Hypertension This is a chronic problem. The problem is controlled. Associated symptoms include blurred vision (has macular degeneration) and headaches (had a bad headache yesterday). Pertinent negatives include no chest pain, palpitations or shortness of breath. Past treatments include beta blockers and angiotensin blockers. The current treatment provides significant improvement. There is no history of kidney disease, CAD/MI or CVA.  Hyperlipidemia This is a chronic problem. The problem is controlled. Recent lipid tests were reviewed and are normal. Pertinent negatives include no chest pain, leg pain, myalgias or shortness of breath. (Pt. Has generalized muscle aches and pains, not sure if its from statin therapy.) Current antihyperlipidemic treatment includes statins and nicotinic acid (Niacin will be a higher tier in 2017, so wanting to change to something less expensive.).  Sinusitis This is a recurrent problem. The problem is unchanged. There has been no fever. Associated symptoms include congestion, coughing, headaches (had a bad headache yesterday) and sinus pressure. Pertinent negatives include no chills, ear pain, shortness of breath or sore throat. Treatments tried: Flonase.     Past Medical History  Diagnosis Date  . Cancer ([place]) 1998    breast  . Hypertension   . Breast cancer ([place])   . 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 Hypertension   . HLD (hyperlipidemia)   . Neuropathy of both feet ([place])   . GERD (gastroesophageal reflux disease)   . History of kidney stones    . Arthritis   . Depression     Past Surgical History  Procedure Laterality Date  . Abdominal hysterectomy    . Breast lumpectomy    . Joint replacement Right     Hip  . Cataract extraction    . Cholecystectomy    . Knee surgery Right     Family History  Problem Relation Age of Onset  . Stroke Mother   . Diabetes Father   . Heart disease Father   . Kidney disease Neg Hx   . Bladder Cancer Neg Hx     Social History   Social History  . Marital Status: Married    Spouse Name: N/A  . Number of Children: N/A  . Years of Education: N/A   Occupational History  . Not on file.   Social History Main Topics  . Smoking status: Former Research scientist (life sciences)  . Smokeless tobacco: Never Used     Comment: smoked in high school for a few years only  . Alcohol Use: No  . Drug Use: No  . Sexual Activity: Not Currently   Other Topics Concern  . Not on file   Social History Narrative     Current outpatient prescriptions:  .  aspirin 81 MG tablet, Take 1 tablet by mouth daily. Reported on 04/30/2015, Disp: , Rfl:  .  Cholecalciferol 2000 UNITS TABS, Take 1 tablet by mouth daily., Disp: , Rfl:  .  fluticasone (FLONASE) 50 MCG/ACT nasal spray, Place 2 sprays into both nostrils at bedtime.,  Disp: , Rfl:  .  LORazepam (ATIVAN) 0.5 MG tablet, Take 1 tablet (0.5 mg total) by mouth 2 (two) times daily as needed for anxiety., Disp: 60 tablet, Rfl: 2 .  losartan (COZAAR) 100 MG tablet, Take 1 tablet (100 mg total) by mouth daily., Disp: 30 tablet, Rfl: 3 .  lovastatin (MEVACOR) 40 MG tablet, Take 1 tablet (40 mg total) by mouth daily., Disp: 30 tablet, Rfl: 3 .  metoprolol (LOPRESSOR) 50 MG tablet, , Disp: , Rfl: 2 .  Multiple Vitamins-Minerals (PRESERVISION/LUTEIN) CAPS, Take 1 each by mouth 2 (two) times daily., Disp: , Rfl:  .  niacin (NIASPAN) 1000 MG CR tablet, Take 1 tablet (1,000 mg total) by mouth at bedtime., Disp: 30 tablet, Rfl: 3 .  Polyethylene Glycol 1450 LIQD, POLYETHYLENE GLYCOL  (Powder) - Historical Medication  17 grams daily Active, Disp: , Rfl:  .  traMADol (ULTRAM) 50 MG tablet, Take 1 tablet (50 mg total) by mouth 2 (two) times daily., Disp: 60 tablet, Rfl: 2  Allergies  Allergen Reactions  . 2,4-D Dimethylamine (Amisol) Other (See Comments)    Other Reaction: OTHER REACTION-IRRITATION TO Esophagous  . Celebrex [Celecoxib]     Esophageal spasms    . Ciprofloxacin   . Ibuprofen   . Metronidazole   . Other Other (See Comments)    Other Reaction: esophagitis    Review of Systems  Constitutional: Negative for fever and chills.  HENT: Positive for congestion and sinus pressure. Negative for ear pain and sore throat.   Eyes: Positive for blurred vision (has macular degeneration). Negative for double vision.  Respiratory: Positive for cough and sputum production. Negative for shortness of breath.   Cardiovascular: Negative for chest pain and palpitations.  Gastrointestinal: Negative for abdominal pain.  Musculoskeletal: Negative for myalgias.  Neurological: Positive for headaches (had a bad headache yesterday).     Objective  Filed Vitals:   06/03/15 1024  BP: 138/76  Pulse: 65  Temp: 97.9 F (36.6 C)  TempSrc: Oral  Resp: 16  Height: 5\' 6"  (1.676 m)  Weight: 169 lb 8 oz (76.885 kg)  SpO2: 93%    Physical Exam  Constitutional: She is oriented to person, place, and time and well-developed, well-nourished, and in no distress.  HENT:  Head: Normocephalic and atraumatic.  Nose: Mucosal edema present. No sinus tenderness. Right sinus exhibits maxillary sinus tenderness and frontal sinus tenderness. Left sinus exhibits maxillary sinus tenderness and frontal sinus tenderness.  Mouth/Throat: Oropharynx is clear and moist and mucous membranes are normal.  Cardiovascular: Normal rate, regular rhythm and normal heart sounds.   Pulmonary/Chest: Effort normal and breath sounds normal.  Musculoskeletal: She exhibits edema (1+ pitting edema, tenderness to  distal palpation lower extremities) and tenderness.  Neurological: She is alert and oriented to person, place, and time.  Skin: Skin is warm and dry.  Nursing note and vitals reviewed.     Assessment & Plan  1. Essential hypertension BP stable and controlled on therapy.  2. Dyslipidemia Recommend continuing on lovastatin. DC niacin, repeat FLP and consider starting on fish oil therapy. Patient verbalized agreement - Lipid Profile - Comprehensive Metabolic Panel (CMET)  3. Acute maxillary sinusitis, recurrence not specified  - amoxicillin-clavulanate (AUGMENTIN) 875-125 MG tablet; Take 1 tablet by mouth 2 (two) times daily.  Dispense: 20 tablet; Refill: 0    Dontel Harshberger Asad A. [facility] 06/03/2015 10:52 AM

## 2015-06-05 LAB — COMPREHENSIVE METABOLIC PANEL
ALBUMIN: 4 g/dL (ref 3.5–4.7)
ALT: 10 IU/L (ref 0–32)
AST: 15 IU/L (ref 0–40)
Albumin/Globulin Ratio: 1.7 (ref 1.1–2.5)
Alkaline Phosphatase: 86 IU/L (ref 39–117)
BUN / CREAT RATIO: 23 (ref 11–26)
BUN: 17 mg/dL (ref 8–27)
Bilirubin Total: 0.3 mg/dL (ref 0.0–1.2)
CALCIUM: 9.2 mg/dL (ref 8.7–10.3)
CO2: 25 mmol/L (ref 18–29)
CREATININE: 0.75 mg/dL (ref 0.57–1.00)
Chloride: 103 mmol/L (ref 96–106)
GFR calc Af Amer: 82 mL/min/{1.73_m2} (ref >59)
GFR, EST NON AFRICAN AMERICAN: 71 mL/min/{1.73_m2} (ref >59)
GLOBULIN, TOTAL: 2.3 g/dL (ref 1.5–4.5)
Glucose: 105 mg/dL — ABNORMAL HIGH (ref 65–99)
Potassium: 4.5 mmol/L (ref 3.5–5.2)
SODIUM: 144 mmol/L (ref 134–144)
TOTAL PROTEIN: 6.3 g/dL (ref 6.0–8.5)

## 2015-06-05 LAB — LIPID PANEL
CHOLESTEROL TOTAL: 131 mg/dL (ref 100–199)
Chol/HDL Ratio: 2.4 ratio units (ref 0.0–4.4)
HDL: 54 mg/dL (ref >39)
LDL Calculated: 53 mg/dL (ref 0–99)
Triglycerides: 119 mg/dL (ref 0–149)
VLDL Cholesterol Cal: 24 mg/dL (ref 5–40)

## 2015-06-21 ENCOUNTER — Emergency Department
Admission: EM | Admit: 2015-06-21 | Discharge: 2015-06-21 | Disposition: A | Discharge status: Home / Self Care | Payer: Medicare Other | Attending: Emergency Medicine | Admitting: Emergency Medicine

## 2015-06-21 ENCOUNTER — Encounter: Payer: Self-pay | Admitting: Emergency Medicine

## 2015-06-21 DIAGNOSIS — Z87891 Personal history of nicotine dependence: Secondary | ICD-10-CM | POA: Insufficient documentation

## 2015-06-21 DIAGNOSIS — Z7951 Long term (current) use of inhaled steroids: Secondary | ICD-10-CM | POA: Insufficient documentation

## 2015-06-21 DIAGNOSIS — Z79899 Other long term (current) drug therapy: Secondary | ICD-10-CM | POA: Insufficient documentation

## 2015-06-21 DIAGNOSIS — I10 Essential (primary) hypertension: Secondary | ICD-10-CM

## 2015-06-21 DIAGNOSIS — Z792 Long term (current) use of antibiotics: Secondary | ICD-10-CM | POA: Insufficient documentation

## 2015-06-21 DIAGNOSIS — Z7982 Long term (current) use of aspirin: Secondary | ICD-10-CM | POA: Insufficient documentation

## 2015-06-21 DIAGNOSIS — F419 Anxiety disorder, unspecified: Secondary | ICD-10-CM | POA: Insufficient documentation

## 2015-06-21 LAB — COMPREHENSIVE METABOLIC PANEL
ALBUMIN: 3.7 g/dL (ref 3.5–5.0)
ALT: 13 U/L — ABNORMAL LOW (ref 14–54)
ANION GAP: 5 (ref 5–15)
AST: 17 U/L (ref 15–41)
Alkaline Phosphatase: 67 U/L (ref 38–126)
BUN: 18 mg/dL (ref 6–20)
CO2: 31 mmol/L (ref 22–32)
Calcium: 9 mg/dL (ref 8.9–10.3)
Chloride: 106 mmol/L (ref 101–111)
Creatinine, Ser: 0.78 mg/dL (ref 0.44–1.00)
GFR calc non Af Amer: >60 mL/min (ref >60)
GLUCOSE: 109 mg/dL — AB (ref 65–99)
POTASSIUM: 4.3 mmol/L (ref 3.5–5.1)
SODIUM: 142 mmol/L (ref 135–145)
TOTAL PROTEIN: 6.5 g/dL (ref 6.5–8.1)
Total Bilirubin: 0.4 mg/dL (ref 0.3–1.2)

## 2015-06-21 LAB — CBC
HCT: 41.4 % (ref 35.0–47.0)
Hemoglobin: 13.5 g/dL (ref 12.0–16.0)
MCH: 29.5 pg (ref 26.0–34.0)
MCHC: 32.6 g/dL (ref 32.0–36.0)
MCV: 90.5 fL (ref 80.0–100.0)
Platelets: 157 10*3/uL (ref 150–440)
RBC: 4.58 MIL/uL (ref 3.80–5.20)
RDW: 13.6 % (ref 11.5–14.5)
WBC: 7 10*3/uL (ref 3.6–11.0)

## 2015-06-21 LAB — TROPONIN I: Troponin I: <0.03 ng/mL (ref <0.031)

## 2015-06-21 NOTE — ED Notes (Signed)
Pt discharged to home.  Discharge instructions reviewed.  Verbalized understanding.  No questions or concerns at this time.  Teach back verified.  Pt in NAD.  No items left in ED.   

## 2015-06-21 NOTE — ED Notes (Signed)
Concerned that blood pressure has been high. Systolic pressure [id number]. States has felt fatigued x 2 days.

## 2015-06-21 NOTE — ED Notes (Signed)
Pt states that she was short of breath over weekend and more tired than normal.  Pt states she recently finished antibiotics.  Denies shortness of breath at this time.  States she still feels a little weaker than she normally does.  States that she has taken her medications as prescribed and has not missed any doses.  States the antibiotics caused her to have diarrhea last week, but that has since resolved.

## 2015-06-21 NOTE — Discharge Instructions (Signed)
Hypertension Hypertension, commonly called high blood pressure, is when the force of blood pumping through your arteries is too strong. Your arteries are the blood vessels that carry blood from your heart throughout your body. A blood pressure reading consists of a higher number over a lower number, such as 110/72. The higher number (systolic) is the pressure inside your arteries when your heart pumps. The lower number (diastolic) is the pressure inside your arteries when your heart relaxes. Ideally you want your blood pressure below 120/80. Hypertension forces your heart to work harder to pump blood. Your arteries may become narrow or stiff. Having untreated or uncontrolled hypertension can cause heart attack, stroke, kidney disease, and other problems. RISK FACTORS Some risk factors for high blood pressure are controllable. Others are not.  Risk factors you cannot control include:   Race. You may be at higher risk if you are African American.  Age. Risk increases with age.  Gender. Men are at higher risk than women before age 45 years. After age 65, women are at higher risk than men. Risk factors you can control include:  Not getting enough exercise or physical activity.  Being overweight.  Getting too much fat, sugar, calories, or salt in your diet.  Drinking too much alcohol. SIGNS AND SYMPTOMS Hypertension does not usually cause signs or symptoms. Extremely high blood pressure (hypertensive crisis) may cause headache, anxiety, shortness of breath, and nosebleed. DIAGNOSIS To check if you have hypertension, your health care provider will measure your blood pressure while you are seated, with your arm held at the level of your heart. It should be measured at least twice using the same arm. Certain conditions can cause a difference in blood pressure between your right and left arms. A blood pressure reading that is higher than normal on one occasion does not mean that you need treatment. If  it is not clear whether you have high blood pressure, you may be asked to return on a different day to have your blood pressure checked again. Or, you may be asked to monitor your blood pressure at home for 1 or more weeks. TREATMENT Treating high blood pressure includes making lifestyle changes and possibly taking medicine. Living a healthy lifestyle can help lower high blood pressure. You may need to change some of your habits. Lifestyle changes may include:  Following the DASH diet. This diet is high in fruits, vegetables, and whole grains. It is low in salt, red meat, and added sugars.  Keep your sodium intake below 2,300 mg per day.  Getting at least 30-45 minutes of aerobic exercise at least 4 times per week.  Losing weight if necessary.  Not smoking.  Limiting alcoholic beverages.  Learning ways to reduce stress. Your health care provider may prescribe medicine if lifestyle changes are not enough to get your blood pressure under control, and if one of the following is true:  You are 18-59 years of age and your systolic blood pressure is above 140.  You are 60 years of age or older, and your systolic blood pressure is above 150.  Your diastolic blood pressure is above 90.  You have diabetes, and your systolic blood pressure is over 140 or your diastolic blood pressure is over 90.  You have kidney disease and your blood pressure is above 140/90.  You have heart disease and your blood pressure is above 140/90. Your personal target blood pressure may vary depending on your medical conditions, your age, and other factors. HOME CARE INSTRUCTIONS    Have your blood pressure rechecked as directed by your health care provider.   Take medicines only as directed by your health care provider. Follow the directions carefully. Blood pressure medicines must be taken as prescribed. The medicine does not work as well when you skip doses. Skipping doses also puts you at risk for  problems.  Do not smoke.   Monitor your blood pressure at home as directed by your health care provider. SEEK MEDICAL CARE IF:   You think you are having a reaction to medicines taken.  You have recurrent headaches or feel dizzy.  You have swelling in your ankles.  You have trouble with your vision. SEEK IMMEDIATE MEDICAL CARE IF:  You develop a severe headache or confusion.  You have unusual weakness, numbness, or feel faint.  You have severe chest or abdominal pain.  You vomit repeatedly.  You have trouble breathing. MAKE SURE YOU:   Understand these instructions.  Will watch your condition.  Will get help right away if you are not doing well or get worse.   This information is not intended to replace advice given to you by your health care provider. Make sure you discuss any questions you have with your health care provider.   Document Released: 05/01/2005 Document Revised: 09/15/2014 Document Reviewed: 02/21/2013 Elsevier Interactive Patient Education 2016 Else[employer] Inc[employer]  

## 2015-06-21 NOTE — ED Provider Notes (Signed)
[facility] Emergency Department Provider Note  ____________________________________________    I have reviewed the triage vital signs and the nursing notes.   HISTORY  Chief Complaint Hypertension    HPI Jodi Bullock is a 82 y.o. female who presents with complaints of high blood pressure. Patient reports she has been feeling fatigued recently but decided to check her blood pressure today and noted it was significant for a higher than normal. She denies headaches. No chest pain. No abdominal pain. No neuro deficits. Reports compliance with her medication     Past Medical History  Diagnosis Date  . Cancer ([place]) 1998    breast  . Hypertension   . Breast cancer ([place])   . 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 Hypertension   . HLD (hyperlipidemia)   . Neuropathy of both feet ([place])   . GERD (gastroesophageal reflux disease)   . History of kidney stones   . Arthritis   . Depression     Patient Active Problem List   Diagnosis Date Noted  . Dyslipidemia 06/03/2015  . Acute maxillary sinusitis 06/03/2015  . Oral mucosal lesion 05/26/2015  . Urge incontinence 02/14/2015  . Ankle edema 02/02/2015  . At risk for falling 01/25/2015  . Dizziness 01/25/2015  . Acid reflux 01/25/2015  . Big thyroid 01/25/2015  . Gravida 2 para 2 01/25/2015  . Personal history of malignant neoplasm of breast 01/25/2015  . Arthritis, degenerative 01/25/2015  . Parity 2 01/25/2015  . Peripheral blood vessel disorder ([place]) 01/25/2015  . Need for vaccination 01/25/2015  . Pain in rectum 01/25/2015  . Reflux 01/25/2015  . Screening for depression 01/25/2015  . Disease of accessory sinus 01/25/2015  . Headache, temporal 01/25/2015  . 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 Disease of thyroid gland 01/25/2015  . Avitaminosis D 01/25/2015  . Family history of diabetes mellitus in father 12/31/2014  . Fasting hyperglycemia 12/31/2014  . Hypertension  12/01/2014  . HLD (hyperlipidemia) 12/01/2014  . Anxiety 12/01/2014  . Myofascial pain 12/01/2014  . Breast CA ([place]) 11/24/2014  . C. difficile colitis 11/24/2014  . Absence of bladder continence 11/24/2014  . Urgency of micturation 11/24/2014  . Common bile duct dilatation 10/20/2014  . Pancreatic cyst 10/20/2014  . Kidney stones 10/20/2014  . Neuritis or radiculitis due to rupture of lumbar intervertebral disc 06/15/2014  . Lumbar canal stenosis 06/15/2014  . Degenerative arthritis of lumbar spine 06/15/2014  . 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 Bloodgood disease 11/05/2012  . Arthritis of temporomandibular joint 06/24/2012  . Atypical chest pain 01/05/2012  . Breath shortness 12/04/2011  . Lung mass 11/15/2011  . Thyroid nodule 11/15/2011  . Age-related macular degeneration, dry 10/03/2011  . Disorder of peripheral nervous system ([place]) 10/03/2011  . Chronic rhinitis 03/03/2011  . Carotid artery narrowing 03/01/2011  . Polypharmacy 12/02/2010    Past Surgical History  Procedure Laterality Date  . Abdominal hysterectomy    . Breast lumpectomy    . Joint replacement Right     Hip  . Cataract extraction    . Cholecystectomy    . Knee surgery Right     Current Outpatient Rx  Name  Route  Sig  Dispense  Refill  . amoxicillin-clavulanate (AUGMENTIN) 875-125 MG tablet   Oral   Take 1 tablet by mouth 2 (two) times daily.   20 tablet   0   . aspirin 81 MG tablet   Oral   Take  1 tablet by mouth daily. Reported on 04/30/2015         . Cholecalciferol 2000 UNITS TABS   Oral   Take 1 tablet by mouth daily.         . fluticasone (FLONASE) 50 MCG/ACT nasal spray   Each Nare   Place 2 sprays into both nostrils at bedtime.         Marland Kitchen LORazepam (ATIVAN) 0.5 MG tablet   Oral   Take 1 tablet (0.5 mg total) by mouth 2 (two) times daily as needed for anxiety.   60 tablet   2   . losartan (COZAAR) 100 MG tablet   Oral   Take 1 tablet (100 mg total) by mouth  daily.   30 tablet   3   . lovastatin (MEVACOR) 40 MG tablet   Oral   Take 1 tablet (40 mg total) by mouth daily.   30 tablet   3   . metoprolol (LOPRESSOR) 50 MG tablet            2   . Multiple Vitamins-Minerals (PRESERVISION/LUTEIN) CAPS   Oral   Take 1 each by mouth 2 (two) times daily.         . niacin (NIASPAN) 1000 MG CR tablet   Oral   Take 1 tablet (1,000 mg total) by mouth at bedtime.   30 tablet   3   . Polyethylene Glycol 1450 LIQD      POLYETHYLENE GLYCOL (Powder) - Historical Medication  17 grams daily Active         . traMADol (ULTRAM) 50 MG tablet   Oral   Take 1 tablet (50 mg total) by mouth 2 (two) times daily.   60 tablet   2     Allergies 2,4-d dimethylamine (amisol); Celebrex; Ciprofloxacin; Ibuprofen; Metronidazole; and Other  Family History  Problem Relation Age of Onset  . Stroke Mother   . Diabetes Father   . Heart disease Father   . Kidney disease Neg Hx   . Bladder Cancer Neg Hx     Social History Social History  Substance Use Topics  . Smoking status: Former Research scientist (life sciences)  . Smokeless tobacco: Never Used     Comment: smoked in high school for a few years only  . Alcohol Use: No    Review of Systems  Constitutional: Positive for fatigue Eyes: Negative for visual changes. ENT: Negative for sore throat Cardiovascular: Negative for chest pain. Respiratory: Felt short of breath last week not this week Gastrointestinal: Negative for abdominal pain,  Genitourinary: Negative for dysuria. Musculoskeletal: Negative for back pain. Skin: Negative for rash. Neurological: Negative for headaches  Psychiatric: Mild anxiety    ____________________________________________   PHYSICAL EXAM:  VITAL SIGNS: ED Triage Vitals  Enc Vitals Group     BP 06/21/15 1837 162/77 mmHg     Pulse Rate 06/21/15 1837 78     Resp 06/21/15 1837 20     Temp 06/21/15 1837 97.6 F (36.4 C)     Temp Source 06/21/15 1837 Oral     SpO2 06/21/15 1837  99 %     Weight 06/21/15 1837 169 lb (76.658 kg)     Height 06/21/15 1837 5\' 6"  (1.676 m)     Head Cir --      Peak Flow --      Pain Score --      Pain Loc --      Pain Edu? --      Excl.  in [place]? --      Constitutional: Alert and oriented. Well appearing and in no distress. Eyes: Conjunctivae are normal.  ENT   Head: Normocephalic and atraumatic.   Mouth/Throat: Mucous membranes are moist. Cardiovascular: Normal rate, regular rhythm. Normal and symmetric distal pulses are present in all extremities.  Respiratory: Normal respiratory effort without tachypnea nor retractions. Breath sounds are clear and equal bilaterally.  Gastrointestinal: Soft and non-tender in all quadrants. No distention. There is no CVA tenderness. Genitourinary: deferred Musculoskeletal: Nontender with normal range of motion in all extremities. No lower extremity tenderness nor edema. Neurologic:  Normal speech and language. No gross focal neurologic deficits are appreciated. Skin:  Skin is warm, dry and intact. No rash noted. Psychiatric: Mood and affect are normal. Patient exhibits appropriate insight and judgment.  ____________________________________________    LABS (pertinent positives/negatives)  Labs Reviewed  COMPREHENSIVE METABOLIC PANEL - Abnormal; Notable for the following:    Glucose, Bld 109 (*)    ALT 13 (*)    All other components within normal limits  CBC  TROPONIN I    ____________________________________________   EKG  ED ECG REPORT I, Lavonia Drafts, the attending physician, personally viewed and interpreted this ECG.  Date: 06/21/2015 EKG Time: 6:46 PM Rate: 71 Rhythm: normal sinus rhythm QRS Axis: normal Intervals: normal ST/T Wave abnormalities: normal Conduction Disturbances: none Narrative Interpretation: Occasional PVCs   ____________________________________________    RADIOLOGY I have personally reviewed any xrays that were ordered on this  patient: None  ____________________________________________   PROCEDURES  Procedure(s) performed: none  Critical Care performed: none  ____________________________________________   INITIAL IMPRESSION / ASSESSMENT AND PLAN / ED COURSE  Pertinent labs & imaging results that were available during my care of the patient were reviewed by me and considered in my medical decision making (see chart for details).  Patient well-appearing and in no distress. She is asymptomatic. She does have a history of high blood pressure. Her lab work is reassuring. Her EKG is normal. Recommended outpatient follow-up with her PCP this week  ____________________________________________   FINAL CLINICAL IMPRESSION(S) / ED DIAGNOSES  Final diagnoses:  Essential hypertension     Lavonia Drafts, MD 06/21/15 2300

## 2015-06-24 ENCOUNTER — Other Ambulatory Visit: Payer: Self-pay

## 2015-06-24 DIAGNOSIS — M7918 Myalgia, other site: Secondary | ICD-10-CM

## 2015-06-24 NOTE — Telephone Encounter (Signed)
Routed to Dr. Shah for approval 

## 2015-06-25 ENCOUNTER — Ambulatory Visit (INDEPENDENT_AMBULATORY_CARE_PROVIDER_SITE_OTHER): Payer: Medicare Other | Admitting: Obstetrics and Gynecology

## 2015-06-25 ENCOUNTER — Encounter: Payer: Self-pay | Admitting: Obstetrics and Gynecology

## 2015-06-25 VITALS — BP 182/72 | HR 80 | Resp 16 | Ht 66.0 in

## 2015-06-25 DIAGNOSIS — R32 Unspecified urinary incontinence: Secondary | ICD-10-CM | POA: Diagnosis not present

## 2015-06-25 LAB — PTNS-PERCUTANEOUS TIBIAL NERVE STIMULATION: Scan Result: 6

## 2015-06-25 MED ORDER — TRAMADOL HCL 50 MG PO TABS: 50.0000 mg | ORAL_TABLET | Freq: Two times a day (BID) | ORAL | Status: DC

## 2015-06-25 NOTE — Progress Notes (Signed)
Patient is an 82 year old white female who presents today for her q monthly PTNS treatment for urge incontinence.   Previous history Patient's urinary symptoms began years ago. Her amount of leakage varies. She states that she has leakage at least 3 times daily. She is making 6 trips during the day to urinate. She is up 4-5 times at night to urinate. She has pain and burning with urination. She has no awareness to the need to urinate, she leaks right after she is aware often one to 2 minutes afterwards. She has difficulty getting to the toilet on time. She is having 3-4 incontinence episodes per day. She is wearing 3-4 heavy pads during the day and overnight depends.  She is unsure of her fluid intake during the day.   Previous Therapy: She has not had Behavioral Modification Techniques. She has been on oxybutynin, Vesicare and Detrol in the past. They did not provide relief. She has also been on Myrbetriq, but she suffered headaches with this therapy.  BP 182/72 mmHg  Pulse 80  Resp 16  Ht 5\' 6"  (1.676 m)  She does not have any contraindications present for PTNS, such as: Pacemaker, Implantable defibrillator, History of abnormal bleeding, History of neuropathies or nerve damage  Discussed with patient possible complications of procedure, such as discomfort, bleeding at insertion/stimulation site, procedure consent signed  After her last visit, she had 4 daytime voids (improved), 2 nighttime voids (unchanged), and 0 incontinence episodes (improved). Patient state that she has also noticed a further decrease in urgency. She did experience an episode of incontinence while she was waiting at the doctors office with her husband but this was out of the ordinary.   Patient goals: Her goals for this therapy are to reduce urgency, urge incontinence, daytime frequency, enuresis and sleep through the night.   PTNS treatment: The needle electrode was inserted to the lower, and  aspect of the patient's left leg. The surface electrode was placed on the inside arch of the foot on the treatment leg. The lead set was connected to the stimulator, and the needle electrode clip was connected to the needle electrode. The stimulator that produces an adjustable electrical pulsatile and sacral nerve plexus via the tibial nerve was increased to a 4 until the patient received  a toe flex.  Treatment Plan:  The electrode was removed and the patient's leg without difficulty. She will not to consume fluids before bed. She will return in 1 month for her PTNS monthly maintenance therapy.  Patient reports that overall she is very pleased with results of her PTNS therapy. Her urinary symptoms are significantly improved. She states that she no longer has to always be aware where the restroom is. She is no longer worried about having incontinence episodes in public. He reports today that her symptoms are stable as far she knows that she has been experiencing a lot of stress lately . she has been expressing some difficulty controlling her blood pressure and was recently seen in the emergency department. She reports that she is working with her primary care provider for better management of her hypertension.

## 2015-06-25 NOTE — Progress Notes (Signed)
PTNS  Session # Maint. #2  Health & Social Factors: Uncontrolled HTN - seen in ED on 06/21/15 Caffeine: 0 Alcohol: 0 Daytime voids #per day: 5 Night-time voids #per night: 2 Urgency: 0 Incontinence Episodes #per day: 0 Ankle used: Left Treatment Setting: 6 Feeling/ Response: Sensory Comments: Thing #1  Preformed By: [name], FNP  Assistant: Golden Hurter, CMA  Follow Up: 1 month

## 2015-06-29 ENCOUNTER — Encounter: Payer: Self-pay | Admitting: Family Medicine

## 2015-06-29 ENCOUNTER — Ambulatory Visit (INDEPENDENT_AMBULATORY_CARE_PROVIDER_SITE_OTHER): Payer: Medicare Other | Admitting: Family Medicine

## 2015-06-29 VITALS — BP 138/69 | HR 67 | Temp 97.5°F | Resp 18 | Ht 66.0 in | Wt 170.3 lb

## 2015-06-29 DIAGNOSIS — M79602 Pain in left arm: Secondary | ICD-10-CM | POA: Diagnosis not present

## 2015-06-29 DIAGNOSIS — I10 Essential (primary) hypertension: Secondary | ICD-10-CM | POA: Diagnosis not present

## 2015-06-29 NOTE — Progress Notes (Signed)
Name: Jodi Bullock   MRN: [id number]    DOB: 03-09-1925   Date:06/29/2015       Progress Note  Subjective  Chief Complaint  Chief Complaint  Patient presents with  . Follow-up    BP  . Hyperlipidemia  . Hypertension    Hypertension This is a chronic problem. The problem has been gradually improving since onset. The problem is controlled. Pertinent negatives include no blurred vision, chest pain, headaches, palpitations or shortness of breath. Past treatments include beta blockers and angiotensin blockers. There is no history of kidney disease, CAD/MI or CVA.  Arm Pain  Injury mechanism: History of trauma to the left arm, had fracture followed by surgery approx 10 years ago. The pain is present in the left elbow, left wrist and left hand. The quality of the pain is described as shooting. The pain does not radiate. The pain is at a severity of 5/10. Pertinent negatives include no chest pain. The symptoms are aggravated by a foreign body (Has 'metal' in the left arm since surgery).    Past Medical History  Diagnosis Date  . Cancer ([place]) 1998    breast  . Hypertension   . Breast cancer ([place])   . 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 Hypertension   . HLD (hyperlipidemia)   . Neuropathy of both feet ([place])   . GERD (gastroesophageal reflux disease)   . History of kidney stones   . Arthritis   . Depression     Past Surgical History  Procedure Laterality Date  . Abdominal hysterectomy    . Breast lumpectomy    . Joint replacement Right     Hip  . Cataract extraction    . Cholecystectomy    . Knee surgery Right     Family History  Problem Relation Age of Onset  . Stroke Mother   . Diabetes Father   . Heart disease Father   . Kidney disease Neg Hx   . Bladder Cancer Neg Hx     Social History   Social History  . Marital Status: Married    Spouse Name: N/A  . Number of Children: N/A  . Years of Education: N/A   Occupational History  . Not on file.    Social History Main Topics  . Smoking status: Former Research scientist (life sciences)  . Smokeless tobacco: Never Used     Comment: smoked in high school for a few years only  . Alcohol Use: No  . Drug Use: No  . Sexual Activity: Not Currently   Other Topics Concern  . Not on file   Social History Narrative     Current outpatient prescriptions:  .  amoxicillin-clavulanate (AUGMENTIN) 875-125 MG tablet, Take 1 tablet by mouth 2 (two) times daily., Disp: 20 tablet, Rfl: 0 .  aspirin 81 MG tablet, Take 1 tablet by mouth daily. Reported on 04/30/2015, Disp: , Rfl:  .  Cholecalciferol 2000 UNITS TABS, Take 1 tablet by mouth daily., Disp: , Rfl:  .  fluticasone (FLONASE) 50 MCG/ACT nasal spray, Place 2 sprays into both nostrils at bedtime., Disp: , Rfl:  .  LORazepam (ATIVAN) 0.5 MG tablet, Take 1 tablet (0.5 mg total) by mouth 2 (two) times daily as needed for anxiety., Disp: 60 tablet, Rfl: 2 .  losartan (COZAAR) 100 MG tablet, Take 1 tablet (100 mg total) by mouth daily., Disp: 30 tablet, Rfl: 3 .  lovastatin (MEVACOR) 40 MG tablet, Take 1 tablet (40 mg total) by  mouth daily., Disp: 30 tablet, Rfl: 3 .  metoprolol (LOPRESSOR) 50 MG tablet, , Disp: , Rfl: 2 .  Multiple Vitamins-Minerals (PRESERVISION/LUTEIN) CAPS, Take 1 each by mouth 2 (two) times daily., Disp: , Rfl:  .  niacin (NIASPAN) 1000 MG CR tablet, Take 1 tablet (1,000 mg total) by mouth at bedtime., Disp: 30 tablet, Rfl: 3 .  Polyethylene Glycol 1450 LIQD, POLYETHYLENE GLYCOL (Powder) - Historical Medication  17 grams daily Active, Disp: , Rfl:  .  traMADol (ULTRAM) 50 MG tablet, Take 1 tablet (50 mg total) by mouth 2 (two) times daily., Disp: 60 tablet, Rfl: 0  Allergies  Allergen Reactions  . 2,4-D Dimethylamine (Amisol) Other (See Comments)    Other Reaction: OTHER REACTION-IRRITATION TO Esophagous  . Celebrex [Celecoxib]     Esophageal spasms    . Ciprofloxacin   . Ibuprofen   . Metronidazole   . Other Other (See Comments)    Other  Reaction: esophagitis     Review of Systems  Constitutional: Negative for fever and chills.  Eyes: Negative for blurred vision.  Respiratory: Negative for shortness of breath.   Cardiovascular: Negative for chest pain and palpitations.  Musculoskeletal: Positive for myalgias and joint pain.  Neurological: Negative for headaches.     Objective  Filed Vitals:   06/29/15 1413  BP: 138/69  Pulse: 67  Temp: 97.5 F (36.4 C)  TempSrc: Oral  Resp: 18  Height: 5\' 6"  (1.676 m)  Weight: 170 lb 4.8 oz (77.248 kg)  SpO2: 96%    Physical Exam  Constitutional: She is oriented to person, place, and time and well-developed, well-nourished, and in no distress.  HENT:  Head: Normocephalic and atraumatic.  Cardiovascular: Normal rate and regular rhythm.   Pulmonary/Chest: Effort normal and breath sounds normal.  Abdominal: Soft. Bowel sounds are normal.  Musculoskeletal:       Arms: Tenderness to palpation over the left elbow joint over the medial area, along the length of the radius. Pt. Reports pain going down her arm from elbow to the tip of her fingers.  Neurological: She is alert and oriented to person, place, and time.  Nursing note and vitals reviewed.   Assessment & Plan  1. Left arm pain  Left arm pain, tenderness over the elbow joint,along the radius, and radial side of her wrist.pain x-ray of the affected area and follow-up. - DG Elbow Complete Left; Future - DG Forearm Left; Future - DG Hand Complete Left; Future - DG Wrist Complete Left; Future  2. Essential hypertension BP is now stable and controlled on therapy. ED note reviewed.it is likely that patient has elevated blood pressure when she is feeling upset and stressed out. She was taking lorazepam 0.5 mg daily as needed, and has now increased it to 0.5 mg 2 times daily as needed which has helped relieve her anxiety. Follow-up in one month.    Jodi Bullock Jodi Bullock [facility] 06/29/2015 3:13 PM

## 2015-06-30 ENCOUNTER — Ambulatory Visit
Admission: RE | Admit: 2015-06-30 | Discharge: 2015-06-30 | Disposition: A | Discharge status: Home / Self Care | Payer: Medicare Other | Source: Ambulatory Visit | Attending: Family Medicine | Admitting: Family Medicine

## 2015-06-30 DIAGNOSIS — M79602 Pain in left arm: Secondary | ICD-10-CM | POA: Diagnosis not present

## 2015-06-30 DIAGNOSIS — Z9889 Other specified postprocedural states: Secondary | ICD-10-CM | POA: Diagnosis not present

## 2015-07-06 ENCOUNTER — Ambulatory Visit
Admission: RE | Admit: 2015-07-06 | Discharge: 2015-07-06 | Disposition: A | Discharge status: Home / Self Care | Payer: Medicare Other | Source: Ambulatory Visit | Attending: Unknown Physician Specialty | Admitting: Unknown Physician Specialty

## 2015-07-06 ENCOUNTER — Other Ambulatory Visit: Payer: Self-pay | Admitting: Unknown Physician Specialty

## 2015-07-06 DIAGNOSIS — I517 Cardiomegaly: Secondary | ICD-10-CM | POA: Insufficient documentation

## 2015-07-06 DIAGNOSIS — R05 Cough: Secondary | ICD-10-CM

## 2015-07-06 DIAGNOSIS — R059 Cough, unspecified: Secondary | ICD-10-CM

## 2015-07-06 DIAGNOSIS — M8580 Other specified disorders of bone density and structure, unspecified site: Secondary | ICD-10-CM | POA: Diagnosis not present

## 2015-07-15 ENCOUNTER — Telehealth: Payer: Self-pay

## 2015-07-15 MED ORDER — METOPROLOL TARTRATE 50 MG PO TABS: 50.0000 mg | ORAL_TABLET | Freq: Two times a day (BID) | ORAL | Status: DC

## 2015-07-15 NOTE — Telephone Encounter (Signed)
Medication has been refilled and sent to [facility] [address] 

## 2015-07-20 ENCOUNTER — Ambulatory Visit: Payer: Medicare Other | Admitting: Family Medicine

## 2015-07-22 ENCOUNTER — Encounter: Payer: Self-pay | Admitting: Family Medicine

## 2015-07-22 ENCOUNTER — Ambulatory Visit (INDEPENDENT_AMBULATORY_CARE_PROVIDER_SITE_OTHER): Payer: Medicare Other | Admitting: Family Medicine

## 2015-07-22 VITALS — BP 118/72 | HR 79 | Temp 98.5°F | Resp 16 | Ht 66.0 in | Wt 173.4 lb

## 2015-07-22 DIAGNOSIS — M7918 Myalgia, other site: Secondary | ICD-10-CM

## 2015-07-22 DIAGNOSIS — M791 Myalgia: Secondary | ICD-10-CM | POA: Diagnosis not present

## 2015-07-22 DIAGNOSIS — E785 Hyperlipidemia, unspecified: Secondary | ICD-10-CM | POA: Diagnosis not present

## 2015-07-22 DIAGNOSIS — J189 Pneumonia, unspecified organism: Secondary | ICD-10-CM | POA: Diagnosis not present

## 2015-07-22 DIAGNOSIS — I499 Cardiac arrhythmia, unspecified: Secondary | ICD-10-CM | POA: Diagnosis not present

## 2015-07-22 MED ORDER — CEFPODOXIME PROXETIL 200 MG PO TABS: 200.0000 mg | ORAL_TABLET | Freq: Two times a day (BID) | ORAL | Status: DC

## 2015-07-22 MED ORDER — LOVASTATIN 40 MG PO TABS: 40.0000 mg | ORAL_TABLET | Freq: Every day | ORAL | Status: DC

## 2015-07-22 MED ORDER — AZITHROMYCIN 250 MG PO TABS: ORAL_TABLET | ORAL | Status: DC

## 2015-07-22 MED ORDER — TRAMADOL HCL 50 MG PO TABS: 50.0000 mg | ORAL_TABLET | Freq: Two times a day (BID) | ORAL | Status: DC

## 2015-07-22 NOTE — Progress Notes (Signed)
Name: Jodi Bullock   MRN: [id number]    DOB: 29-Nov-1924   Date:07/22/2015       Progress Note  Subjective  Chief Complaint  Chief Complaint  Patient presents with  . Fatigue     really tired for 1 week    HPI  Fatigue  Feeling tired, no energy, ready to sleep all the time, has lost interest in activities she used to enjoy. She gets out of breath walking from her apartment to the dining hall (about 2 blocks). She has been coughing with mucus production.   Past Medical History  Diagnosis Date  . Cancer ([place]) 1998    breast  . Hypertension   . Breast cancer ([place])   . 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 Hypertension   . HLD (hyperlipidemia)   . Neuropathy of both feet ([place])   . GERD (gastroesophageal reflux disease)   . History of kidney stones   . Arthritis   . Depression     Past Surgical History  Procedure Laterality Date  . Abdominal hysterectomy    . Breast lumpectomy    . Joint replacement Right     Hip  . Cataract extraction    . Cholecystectomy    . Knee surgery Right     Family History  Problem Relation Age of Onset  . Stroke Mother   . Diabetes Father   . Heart disease Father   . Kidney disease Neg Hx   . Bladder Cancer Neg Hx     Social History   Social History  . Marital Status: Married    Spouse Name: N/A  . Number of Children: N/A  . Years of Education: N/A   Occupational History  . Not on file.   Social History Main Topics  . Smoking status: Former Research scientist (life sciences)  . Smokeless tobacco: Never Used     Comment: smoked in high school for a few years only  . Alcohol Use: No  . Drug Use: No  . Sexual Activity: Not Currently   Other Topics Concern  . Not on file   Social History Narrative     Current outpatient prescriptions:  .  amoxicillin-clavulanate (AUGMENTIN) 875-125 MG tablet, Take 1 tablet by mouth 2 (two) times daily., Disp: 20 tablet, Rfl: 0 .  aspirin 81 MG tablet, Take 1 tablet by mouth daily. Reported on 04/30/2015,  Disp: , Rfl:  .  Cholecalciferol 2000 UNITS TABS, Take 1 tablet by mouth daily., Disp: , Rfl:  .  fluticasone (FLONASE) 50 MCG/ACT nasal spray, Place 2 sprays into both nostrils at bedtime., Disp: , Rfl:  .  LORazepam (ATIVAN) 0.5 MG tablet, Take 1 tablet (0.5 mg total) by mouth 2 (two) times daily as needed for anxiety., Disp: 60 tablet, Rfl: 2 .  losartan (COZAAR) 100 MG tablet, Take 1 tablet (100 mg total) by mouth daily., Disp: 30 tablet, Rfl: 3 .  lovastatin (MEVACOR) 40 MG tablet, Take 1 tablet (40 mg total) by mouth daily., Disp: 30 tablet, Rfl: 3 .  metoprolol (LOPRESSOR) 50 MG tablet, Take 1 tablet (50 mg total) by mouth 2 (two) times daily., Disp: 60 tablet, Rfl: 2 .  Multiple Vitamins-Minerals (PRESERVISION/LUTEIN) CAPS, Take 1 each by mouth 2 (two) times daily., Disp: , Rfl:  .  niacin (NIASPAN) 1000 MG CR tablet, Take 1 tablet (1,000 mg total) by mouth at bedtime., Disp: 30 tablet, Rfl: 3 .  Polyethylene Glycol 1450 LIQD, POLYETHYLENE GLYCOL (Powder) - Historical Medication  17 grams daily Active, Disp: , Rfl:  .  traMADol (ULTRAM) 50 MG tablet, Take 1 tablet (50 mg total) by mouth 2 (two) times daily., Disp: 60 tablet, Rfl: 0  Allergies  Allergen Reactions  . 2,4-D Dimethylamine (Amisol) Other (See Comments)    Other Reaction: OTHER REACTION-IRRITATION TO Esophagous  . Celebrex [Celecoxib]     Esophageal spasms    . Ciprofloxacin   . Ibuprofen   . Metronidazole   . Other Other (See Comments)    Other Reaction: esophagitis     Review of Systems  Constitutional: Positive for malaise/fatigue. Negative for weight loss.  Respiratory: Positive for cough, sputum production and shortness of breath.   Psychiatric/Behavioral: Positive for depression. The patient is nervous/anxious.     Objective  Filed Vitals:   07/22/15 1530  BP: 118/72  Pulse: 79  Temp: 98.5 F (36.9 C)  TempSrc: Oral  Resp: 16  Height: 5\' 6"  (1.676 m)  Weight: 173 lb 6.4 oz (78.654 kg)  SpO2: 96%     Physical Exam  Constitutional: She is well-developed, well-nourished, and in no distress.  Cardiovascular: Normal rate, S1 normal and S2 normal.  An irregular rhythm present.  Pulmonary/Chest: She has decreased breath sounds in the right middle field and the right lower field. She has no wheezes. She has no rhonchi.  Nursing note and vitals reviewed.     Assessment & Plan  1. Community acquired pneumonia Reviewed patient's chest x-ray ordered by Dr. Tami Ribas on 07/06/15, shows possible infiltrate in the right lower lobe consistent with pneumoniasmall pleural effusion. Patient is not aware of her x-ray results and was not contacted by Dr. Ileene Hutchinson office. We will start patient on cephalosporin and macrolide combination for community-acquired pneumonia. Patient will return in 4 weeks to repeat her chest x-ray to ensure resolution. Reassured - cefpodoxime (VANTIN) 200 MG tablet; Take 1 tablet (200 mg total) by mouth 2 (two) times daily.  Dispense: 10 tablet; Refill: 0 - azithromycin (ZITHROMAX) 250 MG tablet; 2 tabs po x day 1, then 1 tab po q day x 4 days  Dispense: 6 each; Refill: 0  2. Irregular heart rhythm  - EKG 12-Lead - Ambulatory referral to Cardiology   [facility] 07/22/2015 3:58 PM

## 2015-07-23 ENCOUNTER — Ambulatory Visit: Payer: Medicare Other | Admitting: Obstetrics and Gynecology

## 2015-08-04 ENCOUNTER — Encounter: Payer: Self-pay | Admitting: Family Medicine

## 2015-08-04 ENCOUNTER — Ambulatory Visit (INDEPENDENT_AMBULATORY_CARE_PROVIDER_SITE_OTHER): Payer: Medicare Other | Admitting: Family Medicine

## 2015-08-04 VITALS — BP 138/78 | HR 90 | Temp 98.0°F | Resp 18 | Ht 66.0 in | Wt 176.1 lb

## 2015-08-04 DIAGNOSIS — I10 Essential (primary) hypertension: Secondary | ICD-10-CM | POA: Diagnosis not present

## 2015-08-04 DIAGNOSIS — C37 Malignant neoplasm of thymus: Secondary | ICD-10-CM | POA: Diagnosis not present

## 2015-08-04 DIAGNOSIS — J181 Lobar pneumonia, unspecified organism: Principal | ICD-10-CM

## 2015-08-04 DIAGNOSIS — J189 Pneumonia, unspecified organism: Secondary | ICD-10-CM | POA: Diagnosis not present

## 2015-08-04 MED ORDER — LOSARTAN POTASSIUM 100 MG PO TABS: 100.0000 mg | ORAL_TABLET | Freq: Every day | ORAL | Status: DC

## 2015-08-04 NOTE — Progress Notes (Signed)
Name: Jodi Bullock   MRN: [id number]    DOB: 05-04-1925   Date:08/04/2015       Progress Note  Subjective  Chief Complaint  Chief Complaint  Patient presents with  . Pneumonia    pt here for follow up    HPI  Pt. Is here to follow up for Pneumonia, seen on CXR from 07/06/2015.She was started on dual antibiotic therapy at her last office visit. Feels better. Coughing and shortness of breath have improved.  In the interim, she has been seen by Dr. Williemae Natter at [facility] and a CT Scan obtained at that visit showed enlarging RLL mass with enlarging mediastinal lymph nodes.  She has been referred to pulmonology at [facility] to obtain a bronchoscopy.   Past Medical History  Diagnosis Date  . Cancer ([place]) 1998    breast  . Hypertension   . Breast cancer ([place])   . 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 Hypertension   . HLD (hyperlipidemia)   . Neuropathy of both feet ([place])   . GERD (gastroesophageal reflux disease)   . History of kidney stones   . Arthritis   . Depression     Past Surgical History  Procedure Laterality Date  . Abdominal hysterectomy    . Breast lumpectomy    . Joint replacement Right     Hip  . Cataract extraction    . Cholecystectomy    . Knee surgery Right     Family History  Problem Relation Age of Onset  . Stroke Mother   . Diabetes Father   . Heart disease Father   . Kidney disease Neg Hx   . Bladder Cancer Neg Hx     Social History   Social History  . Marital Status: Married    Spouse Name: N/A  . Number of Children: N/A  . Years of Education: N/A   Occupational History  . Not on file.   Social History Main Topics  . Smoking status: Former Research scientist (life sciences)  . Smokeless tobacco: Never Used     Comment: smoked in high school for a few years only  . Alcohol Use: No  . Drug Use: No  . Sexual Activity: Not Currently   Other Topics Concern  . Not on file   Social History Narrative     Current outpatient prescriptions:  .  aspirin 81 MG tablet,  Take 1 tablet by mouth daily. Reported on 04/30/2015, Disp: , Rfl:  .  azithromycin (ZITHROMAX) 250 MG tablet, 2 tabs po x day 1, then 1 tab po q day x 4 days, Disp: 6 each, Rfl: 0 .  cefpodoxime (VANTIN) 200 MG tablet, Take 1 tablet (200 mg total) by mouth 2 (two) times daily., Disp: 10 tablet, Rfl: 0 .  Cholecalciferol 2000 UNITS TABS, Take 1 tablet by mouth daily., Disp: , Rfl:  .  fluticasone (FLONASE) 50 MCG/ACT nasal spray, Place 2 sprays into both nostrils at bedtime., Disp: , Rfl:  .  LORazepam (ATIVAN) 0.5 MG tablet, Take 1 tablet (0.5 mg total) by mouth 2 (two) times daily as needed for anxiety., Disp: 60 tablet, Rfl: 2 .  losartan (COZAAR) 100 MG tablet, Take 1 tablet (100 mg total) by mouth daily., Disp: 30 tablet, Rfl: 3 .  lovastatin (MEVACOR) 40 MG tablet, Take 1 tablet (40 mg total) by mouth daily., Disp: 30 tablet, Rfl: 3 .  metoprolol (LOPRESSOR) 50 MG tablet, Take 1 tablet (50 mg total) by mouth 2 (two)  times daily., Disp: 60 tablet, Rfl: 2 .  Multiple Vitamins-Minerals (PRESERVISION/LUTEIN) CAPS, Take 1 each by mouth 2 (two) times daily., Disp: , Rfl:  .  niacin (NIASPAN) 1000 MG CR tablet, Take 1 tablet (1,000 mg total) by mouth at bedtime., Disp: 30 tablet, Rfl: 3 .  Polyethylene Glycol 1450 LIQD, POLYETHYLENE GLYCOL (Powder) - Historical Medication  17 grams daily Active, Disp: , Rfl:  .  traMADol (ULTRAM) 50 MG tablet, Take 1 tablet (50 mg total) by mouth 2 (two) times daily., Disp: 60 tablet, Rfl: 0  Allergies  Allergen Reactions  . 2,4-D Dimethylamine (Amisol) Other (See Comments)    Other Reaction: OTHER REACTION-IRRITATION TO Esophagous  . Celebrex [Celecoxib]     Esophageal spasms    . Ciprofloxacin   . Ibuprofen   . Metronidazole   . Other Other (See Comments)    Other Reaction: esophagitis     Review of Systems  Constitutional: Positive for malaise/fatigue.  Respiratory: Positive for cough and shortness of breath.      Objective  Filed Vitals:    08/04/15 1142  BP: 138/78  Pulse: 90  Temp: 98 F (36.7 C)  Resp: 18  Height: 5\' 6"  (1.676 m)  Weight: 176 lb 1 oz (79.861 kg)  SpO2: 93%    Physical Exam  Constitutional: She is oriented to person, place, and time and well-developed, well-nourished, and in no distress.  Cardiovascular: Normal rate and regular rhythm.   Pulmonary/Chest: No respiratory distress. She has wheezes in the right lower field and the left lower field. She has rhonchi in the right lower field.  Soft expiratory wheezing, worse in right lung, crackles at right lung base, no respiratory distress  Neurological: She is alert and oriented to person, place, and time.  Nursing note and vitals reviewed.      Assessment & Plan  1. RLL pneumonia Clinically feeling better. CT scan of chest obtained at [facility] from 08/02/2015 reviewed, shows enlarging right lower lobe masslike opacity, for which she is scheduled to undergo bronchoscopy by the pulmonologist. Advised to follow up with the specialist as planned by her oncologist at [facility].  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 CT scan obtained by the specialist reviewed and discussed with patient. Advised to continue follow-up as planned  3. Essential hypertension  - losartan (COZAAR) 100 MG tablet; Take 1 tablet (100 mg total) by mouth daily.  Dispense: 30 tablet; Refill: 3   Freddye Cardamone Asad [facility] 08/04/2015 12:23 PM

## 2015-08-05 ENCOUNTER — Encounter: Payer: Self-pay | Admitting: Urology

## 2015-08-05 ENCOUNTER — Ambulatory Visit (INDEPENDENT_AMBULATORY_CARE_PROVIDER_SITE_OTHER): Payer: Medicare Other | Admitting: Urology

## 2015-08-05 VITALS — BP 148/68 | HR 71 | Ht 66.0 in | Wt 175.9 lb

## 2015-08-05 DIAGNOSIS — N3941 Urge incontinence: Secondary | ICD-10-CM

## 2015-08-05 LAB — PTNS-PERCUTANEOUS TIBIAL NERVE STIMULATION: Scan Result: 16

## 2015-08-09 NOTE — Progress Notes (Signed)
Chief Complaint:  Chief Complaint  Patient presents with  . Urge Incontinence    PTNS     HPI: Patient is an 82 year old Caucasian female who presents today for maintenance PTNS treatment.       Previous history Patient's urinary symptoms began years ago. Her amount of leakage varies. She states that she has leakage at least 3 times daily. She is making 6 trips during the day to urinate. She is up 4-5 times at night to urinate. She has pain and burning with urination. She has no awareness to the need to urinate, she leaks right after she is aware often one to 2 minutes afterwards. She has difficulty getting to the toilet on time. She is having 3-4 incontinence episodes per day. She is wearing 3-4 heavy pads during the day and overnight depends.  She is unsure of her fluid intake during the day.   Previous Therapy: She has not had Behavioral Modification Techniques. She has been on oxybutynin, Vesicare and Detrol in the past. They did not provide relief. She has also been on Myrbetriq, but she suffered headaches with this therapy.  She has completed a 12 week course of treatment with PTNS.        BP 148/68 mmHg  Pulse 71  Ht 5\' 6"  (1.676 m)  Wt 175 lb 14.4 oz (79.788 kg)  BMI 28.40 kg/m2  She does not have any contraindications present for PTNS, such as: Pacemaker, Implantable defibrillator, History of abnormal bleeding, History of neuropathies or nerve damage  Discussed with patient possible complications of procedure, such as discomfort, bleeding at insertion/stimulation site, procedure consent signed  After her last visit, she had 4 daytime voids (improved), 2 nighttime voids (stable) and 0 incontinence episodes (improvement).  Patient goals: Her goals for this therapy are to reduce urgency, urge incontinence, daytime frequency, enuresis and sleep through the night.       PTNS treatment: The needle electrode was inserted to the lower, and aspect of the patient's left  leg. The surface electrode was placed on the inside arch of the foot on the treatment leg. The lead set was connected to the stimulator, and the needle electrode clip was connected to the needle electrode. The stimulator that produces an adjustable electrical pulsatile and sacral nerve plexus via the tibial nerve was increased to a 16 until the patient received both a toe flex response.  Treatment Plan:   The electrode was removed and the patient's leg without difficulty. She will not to consume fluids before bed. She will return in 1 month for her next maintenance PTNS therapy.

## 2015-08-11 ENCOUNTER — Telehealth: Payer: Self-pay | Admitting: Family Medicine

## 2015-08-11 DIAGNOSIS — F419 Anxiety disorder, unspecified: Secondary | ICD-10-CM

## 2015-08-11 MED ORDER — LORAZEPAM 0.5 MG PO TABS: 0.5000 mg | ORAL_TABLET | Freq: Two times a day (BID) | ORAL | Status: DC | PRN

## 2015-08-11 NOTE — Telephone Encounter (Signed)
Medication has been refilled and sent to [facility] [address] 

## 2015-08-11 NOTE — Telephone Encounter (Signed)
[place]

## 2015-08-11 NOTE — Telephone Encounter (Signed)
PT NEEDS LORAZEPAM. PHARM IS [place]

## 2015-08-18 ENCOUNTER — Encounter: Payer: Self-pay | Admitting: Family Medicine

## 2015-08-18 ENCOUNTER — Ambulatory Visit (INDEPENDENT_AMBULATORY_CARE_PROVIDER_SITE_OTHER): Payer: Medicare Other | Admitting: Family Medicine

## 2015-08-18 VITALS — BP 142/69 | HR 61 | Temp 97.9°F | Resp 15 | Ht 66.0 in | Wt 172.1 lb

## 2015-08-18 DIAGNOSIS — C37 Malignant neoplasm of thymus: Secondary | ICD-10-CM

## 2015-08-18 NOTE — Progress Notes (Signed)
Name: Jodi Bullock   MRN: [id number]    DOB: Apr 05, 1925   Date:08/18/2015       Progress Note  Subjective  Chief Complaint  Chief Complaint  Patient presents with  . Follow-up    PT results    HPI  Pt. Presents for follow up after Bronchoscopy for concerns about right lung mass, lymphadenopathy, and history of Thymic cancer s/p resection in 2010. Results viewed in EPIC, includes Cytology, Lymph node biopsy, culture respiratory secretions, and surgical pathology. Pt. Has a follow up appointment scheduled with Dr. Williemae Natter at [facility].  Pt. Reports after the bronchoscopy, she had more sputum (yellowish to clear), then some blood in sputum, and finally back to yellowish-clear sputum. Denies any shortness of breath, coughing has improved, no more blood in sputum.  Past Medical History  Diagnosis Date  . Cancer ([place]) 1998    breast  . Hypertension   . Breast cancer ([place])   . 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 Hypertension   . HLD (hyperlipidemia)   . Neuropathy of both feet ([place])   . GERD (gastroesophageal reflux disease)   . History of kidney stones   . Arthritis   . Depression     Past Surgical History  Procedure Laterality Date  . Abdominal hysterectomy    . Breast lumpectomy    . Joint replacement Right     Hip  . Cataract extraction    . Cholecystectomy    . Knee surgery Right     Family History  Problem Relation Age of Onset  . Stroke Mother   . Diabetes Father   . Heart disease Father   . Kidney disease Neg Hx   . Bladder Cancer Neg Hx     Social History   Social History  . Marital Status: Married    Spouse Name: N/A  . Number of Children: N/A  . Years of Education: N/A   Occupational History  . Not on file.   Social History Main Topics  . Smoking status: Former Research scientist (life sciences)  . Smokeless tobacco: Never Used     Comment: smoked in high school for a few years only  . Alcohol Use: No  . Drug Use: No  . Sexual Activity: Not Currently    Other Topics Concern  . Not on file   Social History Narrative     Current outpatient prescriptions:  .  cefpodoxime (VANTIN) 200 MG tablet, Take 1 tablet (200 mg total) by mouth 2 (two) times daily., Disp: 10 tablet, Rfl: 0 .  Cholecalciferol 2000 UNITS TABS, Take 1 tablet by mouth daily., Disp: , Rfl:  .  fluticasone (FLONASE) 50 MCG/ACT nasal spray, Place 2 sprays into both nostrils at bedtime., Disp: , Rfl:  .  LORazepam (ATIVAN) 0.5 MG tablet, Take 1 tablet (0.5 mg total) by mouth 2 (two) times daily as needed for anxiety., Disp: 60 tablet, Rfl: 2 .  losartan (COZAAR) 100 MG tablet, Take 1 tablet (100 mg total) by mouth daily., Disp: 30 tablet, Rfl: 3 .  lovastatin (MEVACOR) 40 MG tablet, Take 1 tablet (40 mg total) by mouth daily., Disp: 30 tablet, Rfl: 3 .  metoprolol (LOPRESSOR) 50 MG tablet, Take 1 tablet (50 mg total) by mouth 2 (two) times daily., Disp: 60 tablet, Rfl: 2 .  Multiple Vitamins-Minerals (PRESERVISION/LUTEIN) CAPS, Take 1 each by mouth 2 (two) times daily., Disp: , Rfl:  .  niacin (NIASPAN) 1000 MG CR tablet, Take 1 tablet (  1,000 mg total) by mouth at bedtime., Disp: 30 tablet, Rfl: 3 .  Polyethylene Glycol 1450 LIQD, POLYETHYLENE GLYCOL (Powder) - Historical Medication  17 grams daily Active, Disp: , Rfl:  .  traMADol (ULTRAM) 50 MG tablet, Take 1 tablet (50 mg total) by mouth 2 (two) times daily., Disp: 60 tablet, Rfl: 0  Allergies  Allergen Reactions  . 2,4-D Dimethylamine (Amisol) Other (See Comments)    Other Reaction: OTHER REACTION-IRRITATION TO Esophagous  . Celebrex [Celecoxib]     Esophageal spasms    . Ciprofloxacin   . Ibuprofen   . Metronidazole Other (See Comments)    neuropathy  . Other Other (See Comments)    Other Reaction: esophagitis     Review of Systems  Constitutional: Negative for fever and chills.  Respiratory: Positive for hemoptysis (now resolved.) and sputum production (improved.). Negative for cough.      Objective  Filed Vitals:   08/18/15 1129  BP: 142/69  Pulse: 61  Temp: 97.9 F (36.6 C)  TempSrc: Oral  Resp: 15  Height: 5\' 6"  (1.676 m)  Weight: 172 lb 1.6 oz (78.064 kg)  SpO2: 93%    Physical Exam  Constitutional: She is well-developed, well-nourished, and in no distress.  Cardiovascular: Normal rate and regular rhythm.   Pulmonary/Chest: She has rhonchi.  Right lower lobe crackles.  Nursing note and vitals reviewed.        Assessment & Plan  1. 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[place]) Notes from patient's oncologist at [facility] reviewed and discussed in detail. Negative cytology, bronchial washings, and lymph node biopsy. Patient has a follow-up scheduled on 08/22/2015. Reassured   Delisia Mcquiston Asad [facility] 08/18/2015 12:08 PM

## 2015-08-20 ENCOUNTER — Ambulatory Visit: Payer: Medicare Other | Admitting: Family Medicine

## 2015-08-31 ENCOUNTER — Ambulatory Visit
Admission: RE | Admit: 2015-08-31 | Discharge: 2015-08-31 | Disposition: A | Discharge status: Home / Self Care | Payer: Medicare Other | Source: Ambulatory Visit | Attending: Family Medicine | Admitting: Family Medicine

## 2015-08-31 ENCOUNTER — Encounter: Payer: Self-pay | Admitting: Family Medicine

## 2015-08-31 ENCOUNTER — Ambulatory Visit (INDEPENDENT_AMBULATORY_CARE_PROVIDER_SITE_OTHER): Payer: Medicare Other | Admitting: Family Medicine

## 2015-08-31 VITALS — BP 120/64 | HR 77 | Temp 98.5°F | Resp 16 | Ht 66.0 in | Wt 172.6 lb

## 2015-08-31 DIAGNOSIS — Z951 Presence of aortocoronary bypass graft: Secondary | ICD-10-CM | POA: Diagnosis not present

## 2015-08-31 DIAGNOSIS — R0609 Other forms of dyspnea: Secondary | ICD-10-CM | POA: Diagnosis not present

## 2015-08-31 DIAGNOSIS — R5383 Other fatigue: Secondary | ICD-10-CM | POA: Diagnosis not present

## 2015-08-31 DIAGNOSIS — R0600 Dyspnea, unspecified: Secondary | ICD-10-CM

## 2015-08-31 DIAGNOSIS — Z9889 Other specified postprocedural states: Secondary | ICD-10-CM | POA: Diagnosis not present

## 2015-08-31 NOTE — Progress Notes (Signed)
Name: Jodi Bullock   MRN: [id number]    DOB: Dec 30, 1924   Date:08/31/2015       Progress Note  Subjective  Chief Complaint  Chief Complaint  Patient presents with  . Shortness of Breath  . Pneumonia    seen by Dr. Tami Ribas    HPI  Shortness of Breath and Fatigue: Pt. Presents for evaluation of shortness of breath, escpecially with exertion (walking 2 blocks from her room to the dining room in the facility) makes her short of breath. She has been diagnosed with Pneumonia (treated with antibiotics) and then a bronchoscopy for 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returned negative). She denies any fevers or chills but has a dry cough mainly at night. She feels tired and sleepy.   Past Medical History  Diagnosis Date  . Cancer ([place]) 1998    breast  . Hypertension   . Breast cancer ([place])   . 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 Hypertension   . HLD (hyperlipidemia)   . Neuropathy of both feet ([place])   . GERD (gastroesophageal reflux disease)   . History of kidney stones   . Arthritis   . Depression     Past Surgical History  Procedure Laterality Date  . Abdominal hysterectomy    . Breast lumpectomy    . Joint replacement Right     Hip  . Cataract extraction    . Cholecystectomy    . Knee surgery Right     Family History  Problem Relation Age of Onset  . Stroke Mother   . Diabetes Father   . Heart disease Father   . Kidney disease Neg Hx   . Bladder Cancer Neg Hx     Social History   Social History  . Marital Status: Married    Spouse Name: N/A  . Number of Children: N/A  . Years of Education: N/A   Occupational History  . Not on file.   Social History Main Topics  . Smoking status: Former Research scientist (life sciences)  . Smokeless tobacco: Never Used     Comment: smoked in high school for a few years only  . Alcohol Use: No  . Drug Use: No  . Sexual Activity: Not Currently   Other Topics Concern  . Not on file   Social History Narrative     Current outpatient prescriptions:   .  cefpodoxime (VANTIN) 200 MG tablet, , Disp: , Rfl: 0 .  Cholecalciferol 2000 UNITS TABS, Take 1 tablet by mouth daily., Disp: , Rfl:  .  fluticasone (FLONASE) 50 MCG/ACT nasal spray, Place 2 sprays into both nostrils at bedtime., Disp: , Rfl:  .  LORazepam (ATIVAN) 0.5 MG tablet, Take 1 tablet (0.5 mg total) by mouth 2 (two) times daily as needed for anxiety., Disp: 60 tablet, Rfl: 2 .  losartan (COZAAR) 100 MG tablet, Take 1 tablet (100 mg total) by mouth daily., Disp: 30 tablet, Rfl: 3 .  lovastatin (MEVACOR) 40 MG tablet, Take 1 tablet (40 mg total) by mouth daily., Disp: 30 tablet, Rfl: 3 .  metoprolol (LOPRESSOR) 50 MG tablet, Take 1 tablet (50 mg total) by mouth 2 (two) times daily., Disp: 60 tablet, Rfl: 2 .  Multiple Vitamins-Minerals (PRESERVISION/LUTEIN) CAPS, Take 1 each by mouth 2 (two) times daily., Disp: , Rfl:  .  niacin (NIASPAN) 1000 MG CR tablet, Take 1 tablet (1,000 mg total) by mouth at bedtime., Disp: 30 tablet, Rfl: 3 .  Polyethylene Glycol 1450 LIQD, POLYETHYLENE GLYCOL (Powder) -  Historical Medication  17 grams daily Active, Disp: , Rfl:  .  traMADol (ULTRAM) 50 MG tablet, Take 1 tablet (50 mg total) by mouth 2 (two) times daily., Disp: 60 tablet, Rfl: 0  Allergies  Allergen Reactions  . 2,4-D Dimethylamine (Amisol) Other (See Comments)    Other Reaction: OTHER REACTION-IRRITATION TO Esophagous  . Celebrex [Celecoxib]     Esophageal spasms    . Ciprofloxacin   . Ibuprofen   . Metronidazole Other (See Comments)    neuropathy  . Other Other (See Comments)    Other Reaction: esophagitis     Review of Systems  Constitutional: Positive for malaise/fatigue. Negative for fever and chills.  Respiratory: Positive for cough and shortness of breath. Negative for sputum production.   Cardiovascular: Negative for chest pain.      Objective  Filed Vitals:   08/31/15 1058  BP: 120/64  Pulse: 77  Temp: 98.5 F (36.9 C)  TempSrc: Oral  Resp: 16  Height: 5\' 6"   (1.676 m)  Weight: 172 lb 9.6 oz (78.291 kg)  SpO2: 94%    Physical Exam  Constitutional: She is well-developed, well-nourished, and in no distress.  HENT:  Head: Normocephalic and atraumatic.  Mouth/Throat: No posterior oropharyngeal erythema.  Cardiovascular: Normal rate, regular rhythm and normal heart sounds.   Pulmonary/Chest: Effort normal. She has rhonchi in the right lower field.  Abdominal: Soft. Bowel sounds are normal. There is no tenderness.  Psychiatric: Mood, memory, affect and judgment normal.  Nursing note and vitals reviewed.    Assessment & Plan  1. Dyspnea on exertion Obtain CXR to rule out and ensure complete resolution of pneumonia. - DG Chest 2 View; Future  2. Other fatigue Likely multifactorial, including anxiety, caring for her husband. We'll obtain baseline laboratory evaluation. - CBC with Differential - Comprehensive Metabolic Panel (CMET) - TSH    Avyukt Cimo Asad A[facility] 08/31/2015 11:23 AM

## 2015-09-01 LAB — CBC WITH DIFFERENTIAL/PLATELET
BASOS ABS: 0.1 10*3/uL (ref 0.0–0.2)
Basos: 1 %
EOS (ABSOLUTE): 0.6 10*3/uL — ABNORMAL HIGH (ref 0.0–0.4)
EOS: 8 %
HEMATOCRIT: 41.9 % (ref 34.0–46.6)
HEMOGLOBIN: 13.9 g/dL (ref 11.1–15.9)
IMMATURE GRANS (ABS): 0 10*3/uL (ref 0.0–0.1)
IMMATURE GRANULOCYTES: 0 %
LYMPHS: 21 %
Lymphocytes Absolute: 1.5 10*3/uL (ref 0.7–3.1)
MCH: 29.8 pg (ref 26.6–33.0)
MCHC: 33.2 g/dL (ref 31.5–35.7)
MCV: 90 fL (ref 79–97)
MONOCYTES: 8 %
Monocytes Absolute: 0.6 10*3/uL (ref 0.1–0.9)
NEUTROS PCT: 62 %
Neutrophils Absolute: 4.6 10*3/uL (ref 1.4–7.0)
Platelets: 179 10*3/uL (ref 150–379)
RBC: 4.67 x10E6/uL (ref 3.77–5.28)
RDW: 13.7 % (ref 12.3–15.4)
WBC: 7.4 10*3/uL (ref 3.4–10.8)

## 2015-09-01 LAB — COMPREHENSIVE METABOLIC PANEL
ALBUMIN: 3.9 g/dL (ref 3.5–4.7)
ALK PHOS: 81 IU/L (ref 39–117)
ALT: 19 IU/L (ref 0–32)
AST: 20 IU/L (ref 0–40)
Albumin/Globulin Ratio: 1.6 (ref 1.2–2.2)
BUN / CREAT RATIO: 16 (ref 12–28)
BUN: 14 mg/dL (ref 8–27)
Bilirubin Total: 0.5 mg/dL (ref 0.0–1.2)
CALCIUM: 9.1 mg/dL (ref 8.7–10.3)
CO2: 24 mmol/L (ref 18–29)
CREATININE: 0.87 mg/dL (ref 0.57–1.00)
Chloride: 103 mmol/L (ref 96–106)
GFR calc Af Amer: 69 mL/min/{1.73_m2} (ref >59)
GFR, EST NON AFRICAN AMERICAN: 60 mL/min/{1.73_m2} (ref >59)
GLOBULIN, TOTAL: 2.4 g/dL (ref 1.5–4.5)
GLUCOSE: 97 mg/dL (ref 65–99)
Potassium: 5 mmol/L (ref 3.5–5.2)
Sodium: 142 mmol/L (ref 134–144)
TOTAL PROTEIN: 6.3 g/dL (ref 6.0–8.5)

## 2015-09-01 LAB — TSH: TSH: 1.05 u[IU]/mL (ref 0.450–4.500)

## 2015-09-02 ENCOUNTER — Encounter: Payer: Self-pay | Admitting: Urology

## 2015-09-02 ENCOUNTER — Ambulatory Visit (INDEPENDENT_AMBULATORY_CARE_PROVIDER_SITE_OTHER): Payer: Medicare Other | Admitting: Urology

## 2015-09-02 ENCOUNTER — Other Ambulatory Visit: Payer: Self-pay | Admitting: Family Medicine

## 2015-09-02 ENCOUNTER — Telehealth: Payer: Self-pay | Admitting: Family Medicine

## 2015-09-02 VITALS — BP 158/62 | HR 76 | Ht 66.0 in | Wt 171.9 lb

## 2015-09-02 DIAGNOSIS — M7918 Myalgia, other site: Secondary | ICD-10-CM

## 2015-09-02 DIAGNOSIS — N3941 Urge incontinence: Secondary | ICD-10-CM

## 2015-09-02 LAB — PTNS-PERCUTANEOUS TIBIAL NERVE STIMULATION: SCAN RESULT: 3

## 2015-09-02 MED ORDER — TRAMADOL HCL 50 MG PO TABS: 50.0000 mg | ORAL_TABLET | Freq: Two times a day (BID) | ORAL | Status: DC

## 2015-09-02 NOTE — Progress Notes (Signed)
Chief Complaint:  Chief Complaint  Patient presents with  . Urge Incontinence    PTNS     HPI: Patient is an 82 year old Caucasian female who presents today for maintenance PTNS treatment.       Previous history Patient's urinary symptoms began years ago. Her amount of leakage varies. She states that she has leakage at least 3 times daily. She is making 6 trips during the day to urinate. She is up 4-5 times at night to urinate. She has pain and burning with urination. She has no awareness to the need to urinate, she leaks right after she is aware often one to 2 minutes afterwards. She has difficulty getting to the toilet on time. She is having 3-4 incontinence episodes per day. She is wearing 3-4 heavy pads during the day and overnight depends.  She is unsure of her fluid intake during the day.   Previous Therapy: She has not had Behavioral Modification Techniques. She has been on oxybutynin, Vesicare and Detrol in the past. They did not provide relief. She has also been on Myrbetriq, but she suffered headaches with this therapy.  She has completed a 12 week course of treatment with PTNS.        BP 158/62 mmHg  Pulse 76  Ht 5\' 6"  (1.676 m)  Wt 171 lb 14.4 oz (77.973 kg)  BMI 27.76 kg/m2  She does not have any contraindications present for PTNS, such as: Pacemaker, Implantable defibrillator, History of abnormal bleeding, History of neuropathies or nerve damage  Discussed with patient possible complications of procedure, such as discomfort, bleeding at insertion/stimulation site, procedure consent signed  After her last visit, she had 5 daytime voids (worsening), 3 nighttime voids (worsening) and 0 incontinence episodes (improvement).  Patient goals: Her goals for this therapy are to reduce urgency, urge incontinence, daytime frequency, enuresis and sleep through the night.       PTNS treatment: The needle electrode was inserted to the lower, and aspect of the patient's  left leg. The surface electrode was placed on the inside arch of the foot on the treatment leg. The lead set was connected to the stimulator, and the needle electrode clip was connected to the needle electrode. The stimulator that produces an adjustable electrical pulsatile and sacral nerve plexus via the tibial nerve was increased to a 3 until the patient a toe flex response.  Treatment Plan:   The electrode was removed and the patient's leg without difficulty. She will not to consume fluids before bed. She will return in 1 month for her next maintenance PTNS therapy.

## 2015-09-02 NOTE — Telephone Encounter (Signed)
Pt needs refill on tramadol

## 2015-09-02 NOTE — Telephone Encounter (Signed)
Prescription provided to patient.

## 2015-09-03 ENCOUNTER — Ambulatory Visit (INDEPENDENT_AMBULATORY_CARE_PROVIDER_SITE_OTHER): Payer: Medicare Other | Admitting: Cardiovascular Disease

## 2015-09-03 ENCOUNTER — Encounter: Payer: Self-pay | Admitting: Cardiovascular Disease

## 2015-09-03 VITALS — BP 142/72 | HR 66 | Ht 66.0 in | Wt 170.0 lb

## 2015-09-03 DIAGNOSIS — I499 Cardiac arrhythmia, unspecified: Secondary | ICD-10-CM | POA: Diagnosis not present

## 2015-09-03 DIAGNOSIS — R0602 Shortness of breath: Secondary | ICD-10-CM

## 2015-09-03 NOTE — Progress Notes (Signed)
Cardiology Office Note   Date:  09/03/2015   ID:  Jodi Bullock, DOB 01-07-1925, MRN [id number]  PCP:  Keith Rake, MD  Cardiologist:   Kathlyn Sacramento, MD   Chief Complaint  Patient presents with  . other    Ref by Dr. Manuella Ghazi for irreg. heart beats. Meds reviewed by the patient verbally. Pt. c/o shortness of breath, irreg. heart beats and fatigue.       History of Present Illness: Jodi Bullock is a 82 y.o. female who Was referred by Dr. Manuella Ghazi for evaluation of irregular heartbeats and exertional dyspnea. The patient reports prolonged history of premature beats. She has multiple chronic medical conditions that include hypertension, hyperlipidemia, premature beats, thymus cancer status post resection and esophageal spasm. She was evaluated at [facility] in 2013 for significant exertional dyspnea. Her workup included a treadmill nuclear stress test which showed no evidence of ischemia with normal ejection fraction. Echocardiogram was unremarkable. The patient reports having pneumonia recently and was treated with 2 antibiotics. She reports significant worsening of exertional dyspnea which is currently happening with minimal activities. She has occasional chest pain at rest which she attributes to her known esophageal spasm but no exertional chest pain. No orthopnea or PND. She denies palpitations. No syncope.  Past Medical History  Diagnosis Date  . Cancer ([place]) 1998    breast  . Hypertension   . Breast cancer ([place])   . 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 Hypertension   . HLD (hyperlipidemia)   . Neuropathy of both feet ([place])   . GERD (gastroesophageal reflux disease)   . History of kidney stones   . Arthritis   . Depression     Past Surgical History  Procedure Laterality Date  . Abdominal hysterectomy    . Breast lumpectomy    . Joint replacement Right     Hip  . Cataract extraction    . Cholecystectomy    . Knee surgery Right      Current Outpatient Prescriptions    Medication Sig Dispense Refill  . Cholecalciferol 2000 UNITS TABS Take 1 tablet by mouth daily.    . fluticasone (FLONASE) 50 MCG/ACT nasal spray Place 2 sprays into both nostrils at bedtime.    Marland Kitchen LORazepam (ATIVAN) 0.5 MG tablet Take 1 tablet (0.5 mg total) by mouth 2 (two) times daily as needed for anxiety. 60 tablet 2  . losartan (COZAAR) 100 MG tablet Take 1 tablet (100 mg total) by mouth daily. 30 tablet 3  . lovastatin (MEVACOR) 40 MG tablet Take 1 tablet (40 mg total) by mouth daily. 30 tablet 3  . metoprolol (LOPRESSOR) 50 MG tablet Take 1 tablet (50 mg total) by mouth 2 (two) times daily. 60 tablet 2  . Multiple Vitamins-Minerals (PRESERVISION/LUTEIN) CAPS Take 1 each by mouth 2 (two) times daily.    . Polyethylene Glycol 1450 LIQD POLYETHYLENE GLYCOL (Powder) - Historical Medication  17 grams daily Active    . traMADol (ULTRAM) 50 MG tablet Take 1 tablet (50 mg total) by mouth 2 (two) times daily. 60 tablet 0   No current facility-administered medications for this visit.    Allergies:   2,4-d dimethylamine (amisol); Celebrex; Ciprofloxacin; Ibuprofen; Metronidazole; and Other    Social History:  The patient  reports that she has quit smoking. She has never used smokeless tobacco. She reports that she does not drink alcohol or use illicit drugs.   Family History:  The patient's family history includes Diabetes  in her father; Heart disease in her father; Stroke in her mother. There is no history of Kidney disease or Bladder Cancer.    ROS:  Please see the history of present illness.   Otherwise, review of systems are positive for none.   All other systems are reviewed and negative.    PHYSICAL EXAM: VS:  BP 142/72 mmHg  Pulse 66  Ht 5\' 6"  (1.676 m)  Wt 170 lb (77.111 kg)  BMI 27.45 kg/m2 , BMI Body mass index is 27.45 kg/(m^2). GEN: Well nourished, well developed, in no acute distress HEENT: normal Neck: no JVD, carotid bruits, or masses Cardiac: RRR; no murmurs, rubs, or  gallops,no edema  Respiratory:  clear to auscultation bilaterally, normal work of breathing GI: soft, nontender, nondistended, + BS MS: no deformity or atrophy Skin: warm and dry, no rash Neuro:  Strength and sensation are intact Psych: euthymic mood, full affect   EKG:  EKG is ordered today. The ekg ordered today demonstrates  normal sinus rhythm with first-degree AV block and PVCs   Recent Labs: 06/21/2015: Hemoglobin 13.5 08/31/2015: ALT 19; BUN 14; Creatinine, Ser 0.87; Platelets 179; Potassium 5.0; Sodium 142; TSH 1.050    Lipid Panel    Component Value Date/Time   CHOL 131 06/04/2015 0810   TRIG 119 06/04/2015 0810   HDL 54 06/04/2015 0810   CHOLHDL 2.4 06/04/2015 0810   LDLCALC 53 06/04/2015 0810      Wt Readings from Last 3 Encounters:  09/03/15 170 lb (77.111 kg)  09/02/15 171 lb 14.4 oz (77.973 kg)  08/31/15 172 lb 9.6 oz (78.291 kg)      Other studies Reviewed: Additional studies/ records that were reviewed today include: Previous cardiac workup at [facility]. Review of the above records demonstrates:  Summarized above   ASSESSMENT AND PLAN:  1.  Exertional dyspnea: This could be multifactorial due to physical deconditioning, lung disease and possible diastolic heart failure. Ischemic heart disease cannot be excluded considering her risk factors. Recent exertional dyspnea has significantly limited her physical activities. I recommend evaluation with a pharmacologic nuclear stress test and an echocardiogram. She is not able to exercise on a treadmill. If cardiac workup is negative, consider pulmonary evaluation.  2. PVCs: She reports prolonged history of this with no significant symptoms related to this. If LV systolic function is normal, these probably do not need to be treated.     Disposition:   FU with me in 1 month  Signed,  Kathlyn Sacramento, MD  09/03/2015 11:30 AM    [place]

## 2015-09-03 NOTE — Patient Instructions (Addendum)
Medication Instructions:  Your physician recommends that you continue on your current medications as directed. Please refer to the Current Medication list given to you today.   Labwork: none  Testing/Procedures: Your physician has requested that you have an echocardiogram. Echocardiography is a painless test that uses sound waves to create images of your heart. It provides your doctor with information about the size and shape of your heart and how well your heart's chambers and valves are working. This procedure takes approximately one hour. There are no restrictions for this procedure.  Your physician has requested that you have a lexiscan myoview. For further information please visit [email or web address]. Please follow instruction sheet, as given.  [place]  Your caregiver has ordered a Stress Test with nuclear imaging. The purpose of this test is to evaluate the blood supply to your heart muscle. This procedure is referred to as a "Non-Invasive Stress Test." This is because other than having an IV started in your vein, nothing is inserted or "invades" your body. Cardiac stress tests are done to find areas of poor blood flow to the heart by determining the extent of coronary artery disease (CAD). Some patients exercise on a treadmill, which naturally increases the blood flow to your heart, while others who are  unable to walk on a treadmill due to physical limitations have a pharmacologic/chemical stress agent called Lexiscan . This medicine will mimic walking on a treadmill by temporarily increasing your coronary blood flow.   Please note: these test may take anywhere between 2-4 hours to complete  PLEASE REPORT TO [place] TO GO  Date of Procedure: Thursday, April 27  Arrival Time for Procedure: 8:45am  Instructions regarding medication:   __xx__:  Hold metoprolol the morning of procedure    PLEASE NOTIFY  THE OFFICE AT LEAST 24 HOURS IN ADVANCE IF YOU ARE UNABLE TO KEEP YOUR APPOINTMENT.  [phone] AND  PLEASE NOTIFY NUCLEAR MEDICINE AT [facility] AT LEAST 24 HOURS IN ADVANCE IF YOU ARE UNABLE TO KEEP YOUR APPOINTMENT. [phone]  How to prepare for your Myoview test:   Do not eat or drink after midnight  No caffeine for 24 hours prior to test  No smoking 24 hours prior to test.  Your medication may be taken with water.  If your doctor stopped a medication because of this test, do not take that medication.  Ladies, please do not wear dresses.  Skirts or pants are appropriate. Please wear a short sleeve shirt.  No perfume, cologne or lotion.  Wear comfortable walking shoes. No heels!            Follow-Up: Your physician recommends that you schedule a follow-up appointment in: one month with Dr. Fletcher Anon.   Any Other Special Instructions Will Be Listed Below (If Applicable).     If you need a refill on your cardiac medications before your next appointment, please call your pharmacy.  Echocardiogram An echocardiogram, or echocardiography, uses sound waves (ultrasound) to produce an image of your heart. The echocardiogram is simple, painless, obtained within a short period of time, and offers valuable information to your health care provider. The images from an echocardiogram can provide information such as:  Evidence of coronary artery disease (CAD).  Heart size.  Heart muscle function.  Heart valve function.  Aneurysm detection.  Evidence of a past heart attack.  Fluid buildup around the heart.  Heart muscle thickening.  Assess heart valve  function. LET [facility] CARE PROVIDER KNOW ABOUT:  Any allergies you have.  All medicines you are taking, including vitamins, herbs, eye drops, creams, and over-the-counter medicines.  Previous problems you or members of your family have had with the use of anesthetics.  Any blood disorders you have.  Previous  surgeries you have had.  Medical conditions you have.  Possibility of pregnancy, if this applies. BEFORE THE PROCEDURE  No special preparation is needed. Eat and drink normally.  PROCEDURE   In order to produce an image of your heart, gel will be applied to your chest and a wand-like tool (transducer) will be moved over your chest. The gel will help transmit the sound waves from the transducer. The sound waves will harmlessly bounce off your heart to allow the heart images to be captured in real-time motion. These images will then be recorded.  You may need an IV to receive a medicine that improves the quality of the pictures. AFTER THE PROCEDURE You may return to your normal schedule including diet, activities, and medicines, unless your health care provider tells you otherwise.   This information is not intended to replace advice given to you by your health care provider. Make sure you discuss any questions you have with your health care provider.   Document Released: 04/28/2000 Document Revised: 05/22/2014 Document Reviewed: 01/06/2013 Elsevier Interactive Patient Education 2016 [place]. Cardiac Nuclear Scanning A cardiac nuclear scan is used to check your heart for problems, such as the following:  A portion of the heart is not getting enough blood.  Part of the heart muscle has died, which happens with a heart attack.  The heart wall is not working normally.  In this test, a radioactive dye (tracer) is injected into your bloodstream. After the tracer has traveled to your heart, a scanning device is used to measure how much of the tracer is absorbed by or distributed to various areas of your heart. LET [facility] CARE PROVIDER KNOW ABOUT:  Any allergies you have.  All medicines you are taking, including vitamins, herbs, eye drops, creams, and over-the-counter medicines.  Previous problems you or members of your family have had with the use of anesthetics.  Any blood  disorders you have.  Previous surgeries you have had.  Medical conditions you have.  RISKS AND COMPLICATIONS Generally, this is a safe procedure. However, as with any procedure, problems can occur. Possible problems include:   Serious chest pain.  Rapid heartbeat.  Sensation of warmth in your chest. This usually passes quickly. BEFORE THE PROCEDURE Ask your health care provider about changing or stopping your regular medicines. PROCEDURE This procedure is usually done at a hospital and takes 2-4 hours.  An IV tube is inserted into one of your veins.  Your health care provider will inject a small amount of radioactive tracer through the tube.  You will then wait for 20-40 minutes while the tracer travels through your bloodstream.  You will lie down on an exam table so images of your heart can be taken. Images will be taken for about 15-20 minutes.  You will exercise on a treadmill or stationary bike. While you exercise, your heart activity will be monitored with an electrocardiogram (ECG), and your blood pressure will be checked.  If you are unable to exercise, you may be given a medicine to make your heart beat faster.  When blood flow to your heart has peaked, tracer will again be injected through the IV tube.  After  20-40 minutes, you will get back on the exam table and have more images taken of your heart.  When the procedure is over, your IV tube will be removed. AFTER THE PROCEDURE  You will likely be able to leave shortly after the test. Unless your health care provider tells you otherwise, you may return to your normal schedule, including diet, activities, and medicines.  Make sure you find out how and when you will get your test results.   This information is not intended to replace advice given to you by your health care provider. Make sure you discuss any questions you have with your health care provider.   Document Released: 05/26/2004 Document Revised:  05/06/2013 Document Reviewed: 04/09/2013 Elsevier Interactive Patient Education [employer].

## 2015-09-06 ENCOUNTER — Ambulatory Visit: Payer: Medicare Other | Admitting: Family Medicine

## 2015-09-08 ENCOUNTER — Telehealth: Payer: Self-pay | Admitting: Cardiovascular Disease

## 2015-09-08 NOTE — Telephone Encounter (Signed)
S/w pt to review lexi myoview instructions. Pt verbalized understanding of time, date, medications to hold, location, NPO and ability to drive after test. Pt agreeable w/plan w/no further questions.

## 2015-09-08 NOTE — Telephone Encounter (Signed)
Patient wants to make sure it is ok to drive herself to her nm stress test tomorrow.  Please call.

## 2015-09-09 ENCOUNTER — Encounter
Admission: RE | Admit: 2015-09-09 | Discharge: 2015-09-09 | Disposition: A | Discharge status: Home / Self Care | Payer: Medicare Other | Source: Ambulatory Visit | Attending: Cardiovascular Disease | Admitting: Cardiovascular Disease

## 2015-09-09 DIAGNOSIS — R0602 Shortness of breath: Secondary | ICD-10-CM | POA: Insufficient documentation

## 2015-09-09 LAB — NM MYOCAR MULTI W/SPECT W/WALL MOTION / EF
CHL CUP NUCLEAR SDS: 1
CHL CUP STRESS STAGE 1 GRADE: 0 %
CHL CUP STRESS STAGE 1 HR: 62 {beats}/min
CHL CUP STRESS STAGE 2 HR: 62 {beats}/min
CHL CUP STRESS STAGE 3 HR: 71 {beats}/min
CHL CUP STRESS STAGE 4 GRADE: 0 %
CHL CUP STRESS STAGE 4 HR: 78 {beats}/min
CHL CUP STRESS STAGE 4 SPEED: 0 mph
CHL CUP STRESS STAGE 5 DBP: 61 mmHg
CSEPEW: 1 METS
CSEPPHR: 71 {beats}/min
CSEPPMHR: 53 %
LV sys vol: 88 mL
LVDIAVOL: 145 mL (ref 46–106)
NUC STRESS TID: 0.95
Percent HR: 59 %
Rest HR: 65 {beats}/min
SRS: 0
SSS: 1
Stage 1 Speed: 0 mph
Stage 2 Grade: 0 %
Stage 2 Speed: 0 mph
Stage 3 Grade: 0 %
Stage 3 Speed: 0 mph
Stage 5 Grade: 0 %
Stage 5 HR: 76 {beats}/min
Stage 5 SBP: 145 mmHg
Stage 5 Speed: 0 mph

## 2015-09-09 MED ORDER — TECHNETIUM TC 99M SESTAMIBI - CARDIOLITE
31.0800 | Freq: Once | INTRAVENOUS | Status: AC | PRN
  Administered 2015-09-09: 10:00:00 31.08 via INTRAVENOUS

## 2015-09-09 MED ORDER — REGADENOSON 0.4 MG/5ML IV SOLN
0.4000 mg | Freq: Once | INTRAVENOUS | Status: AC
  Administered 2015-09-09: 0.4 mg via INTRAVENOUS

## 2015-09-09 MED ORDER — TECHNETIUM TC 99M SESTAMIBI - CARDIOLITE
11.6900 | Freq: Once | INTRAVENOUS | Status: AC | PRN
  Administered 2015-09-09: 09:00:00 11.69 via INTRAVENOUS

## 2015-09-15 ENCOUNTER — Ambulatory Visit (INDEPENDENT_AMBULATORY_CARE_PROVIDER_SITE_OTHER): Payer: Medicare Other

## 2015-09-15 ENCOUNTER — Other Ambulatory Visit: Payer: Self-pay

## 2015-09-15 DIAGNOSIS — R0602 Shortness of breath: Secondary | ICD-10-CM | POA: Diagnosis not present

## 2015-09-15 DIAGNOSIS — I159 Secondary hypertension, unspecified: Secondary | ICD-10-CM

## 2015-09-15 MED ORDER — FUROSEMIDE 20 MG PO TABS: 20.0000 mg | ORAL_TABLET | Freq: Every day | ORAL | Status: DC

## 2015-09-22 ENCOUNTER — Other Ambulatory Visit (INDEPENDENT_AMBULATORY_CARE_PROVIDER_SITE_OTHER): Payer: Medicare Other

## 2015-09-22 DIAGNOSIS — I159 Secondary hypertension, unspecified: Secondary | ICD-10-CM

## 2015-09-23 LAB — BASIC METABOLIC PANEL
BUN/Creatinine Ratio: 18 (ref 12–28)
BUN: 14 mg/dL (ref 8–27)
CALCIUM: 9.1 mg/dL (ref 8.7–10.3)
CO2: 25 mmol/L (ref 18–29)
CREATININE: 0.76 mg/dL (ref 0.57–1.00)
Chloride: 103 mmol/L (ref 96–106)
GFR, EST AFRICAN AMERICAN: 81 mL/min/{1.73_m2} (ref >59)
GFR, EST NON AFRICAN AMERICAN: 70 mL/min/{1.73_m2} (ref >59)
Glucose: 104 mg/dL — ABNORMAL HIGH (ref 65–99)
Potassium: 4.3 mmol/L (ref 3.5–5.2)
Sodium: 143 mmol/L (ref 134–144)

## 2015-09-27 ENCOUNTER — Other Ambulatory Visit: Payer: Self-pay | Admitting: Family Medicine

## 2015-09-27 DIAGNOSIS — M7918 Myalgia, other site: Secondary | ICD-10-CM

## 2015-09-27 MED ORDER — TRAMADOL HCL 50 MG PO TABS: 50.0000 mg | ORAL_TABLET | Freq: Two times a day (BID) | ORAL | Status: DC

## 2015-10-07 ENCOUNTER — Other Ambulatory Visit: Payer: Self-pay | Admitting: Family Medicine

## 2015-10-12 ENCOUNTER — Encounter: Payer: Self-pay | Admitting: Cardiovascular Disease

## 2015-10-12 ENCOUNTER — Ambulatory Visit (INDEPENDENT_AMBULATORY_CARE_PROVIDER_SITE_OTHER): Payer: Medicare Other | Admitting: Cardiovascular Disease

## 2015-10-12 VITALS — BP 150/60 | HR 64 | Ht 66.0 in | Wt 168.0 lb

## 2015-10-12 DIAGNOSIS — I5022 Chronic systolic (congestive) heart failure: Secondary | ICD-10-CM | POA: Insufficient documentation

## 2015-10-12 NOTE — Progress Notes (Signed)
Cardiology Office Note   Date:  10/12/2015   ID:  Venecia Endress, DOB 1924-06-29, MRN [id number]  PCP:  Keith Rake, MD  Cardiologist:   Kathlyn Sacramento, MD   Chief Complaint  Patient presents with  . other    1 Month ECHO F/U. Medications verbally reviewed with patient.       History of Present Illness: Jodi Bullock is a 82 y.o. female who is here today for a follow-up visit regarding exertional dyspnea. The patient reports prolonged history of premature beats. She has multiple chronic medical conditions that include hypertension, hyperlipidemia, premature beats, thymus cancer status post resection and esophageal spasm. She was evaluated at [facility] in 2013 for significant exertional dyspnea. Her workup included a treadmill nuclear stress test which showed no evidence of ischemia with normal ejection fraction. Echocardiogram was unremarkable. The patient had recurrent episodes of pneumonia in the last few months.  She had significant worsening of exertional dyspnea and thus I proceeded with cardiac evaluation. A pharmacologic nuclear stress test showed no evidence of perfusion defects. Ejection fraction was calculated to be 39% although visually appeared better. Echocardiogram showed mildly reduced LV systolic function with an ejection fraction of 40-45% with possible inferior wall hypokinesis, elevated filling pressures and mild to moderate pulmonary hypertension with systolic pulmonary pressure 46 mmHg. I started the patient on small dose Lasix 20 mg once daily. A follow-up basic metabolic profile was stable. She reports improvement in shortness of breath with no orthopnea, PND or leg edema.  Past Medical History  Diagnosis Date  . Cancer ([place]) 1998    breast  . Hypertension   . Breast cancer ([place])   . 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 Hypertension   . HLD (hyperlipidemia)   . Neuropathy of both feet ([place])   . GERD (gastroesophageal reflux disease)   . History of kidney  stones   . Arthritis   . Depression     Past Surgical History  Procedure Laterality Date  . Abdominal hysterectomy    . Breast lumpectomy    . Joint replacement Right     Hip  . Cataract extraction    . Cholecystectomy    . Knee surgery Right      Current Outpatient Prescriptions  Medication Sig Dispense Refill  . Cholecalciferol 2000 UNITS TABS Take 1 tablet by mouth daily.    . fluticasone (FLONASE) 50 MCG/ACT nasal spray Place 2 sprays into both nostrils at bedtime.    . furosemide (LASIX) 20 MG tablet Take 1 tablet (20 mg total) by mouth daily. 30 tablet 3  . LORazepam (ATIVAN) 0.5 MG tablet Take 1 tablet (0.5 mg total) by mouth 2 (two) times daily as needed for anxiety. 60 tablet 2  . losartan (COZAAR) 100 MG tablet Take 1 tablet (100 mg total) by mouth daily. 30 tablet 3  . lovastatin (MEVACOR) 40 MG tablet Take 1 tablet (40 mg total) by mouth daily. 30 tablet 3  . metoprolol (LOPRESSOR) 50 MG tablet TAKE 1 TABLET(50 MG) BY MOUTH TWICE DAILY 60 tablet 0  . Multiple Vitamins-Minerals (PRESERVISION/LUTEIN) CAPS Take 1 each by mouth 2 (two) times daily.    . Polyethylene Glycol 1450 LIQD POLYETHYLENE GLYCOL (Powder) - Historical Medication  17 grams daily Active    . traMADol (ULTRAM) 50 MG tablet Take 1 tablet (50 mg total) by mouth 2 (two) times daily. 60 tablet 0   No current facility-administered medications for this visit.  Allergies:   2,4-d dimethylamine (amisol); Celebrex; Ciprofloxacin; Ibuprofen; Metronidazole; and Other    Social History:  The patient  reports that she has quit smoking. She has never used smokeless tobacco. She reports that she does not drink alcohol or use illicit drugs.   Family History:  The patient's family history includes Diabetes in her father; Heart disease in her father; Stroke in her mother. There is no history of Kidney disease or Bladder Cancer.    ROS:  Please see the history of present illness.   Otherwise, review of systems are  positive for none.   All other systems are reviewed and negative.    PHYSICAL EXAM: VS:  BP 150/60 mmHg  Pulse 64  Ht 5\' 6"  (1.676 m)  Wt 168 lb (76.204 kg)  BMI 27.13 kg/m2 , BMI Body mass index is 27.13 kg/(m^2). GEN: Well nourished, well developed, in no acute distress HEENT: normal Neck: no JVD, carotid bruits, or masses Cardiac: RRR; no murmurs, rubs, or gallops,no edema  Respiratory:  clear to auscultation bilaterally, normal work of breathing GI: soft, nontender, nondistended, + BS MS: no deformity or atrophy Skin: warm and dry, no rash Neuro:  Strength and sensation are intact Psych: euthymic mood, full affect   EKG:  EKG is not ordered today.    Recent Labs: 06/21/2015: Hemoglobin 13.5 08/31/2015: ALT 19; Platelets 179; TSH 1.050 09/22/2015: BUN 14; Creatinine, Ser 0.76; Potassium 4.3; Sodium 143    Lipid Panel    Component Value Date/Time   CHOL 131 06/04/2015 0810   TRIG 119 06/04/2015 0810   HDL 54 06/04/2015 0810   CHOLHDL 2.4 06/04/2015 0810   LDLCALC 53 06/04/2015 0810      Wt Readings from Last 3 Encounters:  10/12/15 168 lb (76.204 kg)  09/03/15 170 lb (77.111 kg)  09/02/15 171 lb 14.4 oz (77.973 kg)      Other studies Reviewed: Additional studies/ records that were reviewed today include: Previous cardiac workup at [facility]. Review of the above records demonstrates:  Summarized above   ASSESSMENT AND PLAN:  1.  Chronic systolic heart failure: Recent cardiac evaluation showed an ejection fraction of 40-45%. I again reviewed her nuclear stress test which showed no evidence of perfusion defects. Thus, most likely this is nonischemic cardiomyopathy. She is already on metoprolol and losartan. Echocardiogram was suggestive of volume overload and she was started on small dose Lasix 20 mg daily with significant improvement in dyspnea. Given that her symptoms have improved, I recommend medical therapy for now and reserving cardiac catheterization for worsening  symptoms.  2. PVCs: She reports prolonged history of this with no significant symptoms related to this. Continue treatment with metoprolol.  3. Stress: The patient appears to be under significant stress which might be contributing to some of her physical complaints.   Disposition:   FU with me in 3 months  Signed,  Kathlyn Sacramento, MD  10/12/2015 3:57 PM    [place]

## 2015-10-12 NOTE — Patient Instructions (Signed)
Medication Instructions: Continue same medications.   Labwork: None.   Procedures/Testing: None.   Follow-Up: 3 months with Dr. Shantara Goosby.   Any Additional Special Instructions Will Be Listed Below (If Applicable).     If you need a refill on your cardiac medications before your next appointment, please call your pharmacy.   

## 2015-10-14 ENCOUNTER — Other Ambulatory Visit: Payer: Self-pay | Admitting: Family Medicine

## 2015-10-20 ENCOUNTER — Encounter: Payer: Self-pay | Admitting: Family Medicine

## 2015-10-20 ENCOUNTER — Ambulatory Visit (INDEPENDENT_AMBULATORY_CARE_PROVIDER_SITE_OTHER): Payer: Medicare Other | Admitting: Family Medicine

## 2015-10-20 VITALS — BP 140/71 | HR 68 | Temp 97.9°F | Resp 15 | Ht 66.0 in | Wt 169.8 lb

## 2015-10-20 DIAGNOSIS — I5022 Chronic systolic (congestive) heart failure: Secondary | ICD-10-CM | POA: Diagnosis not present

## 2015-10-20 DIAGNOSIS — I10 Essential (primary) hypertension: Secondary | ICD-10-CM | POA: Diagnosis not present

## 2015-10-20 NOTE — Progress Notes (Signed)
Name: Jodi Bullock   MRN: [id number]    DOB: 1925/02/01   Date:10/20/2015       Progress Note  Subjective  Chief Complaint  Chief Complaint  Patient presents with  . Follow-up    BP    HPI  Hypertension: pt. Presents for Blood Pressure recheck. SHe was recently seen by Cardiologist and her Blood Pressure was elevated to 150/60 in his office and she decided to set up an appointment with me.  She is a patient well-known to me, has been under considerable stress, which is partly causing a rise in her Blood Pressure.    Past Medical History  Diagnosis Date  . Cancer ([place]) 1998    breast  . Hypertension   . Breast cancer ([place])   . 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 Hypertension   . HLD (hyperlipidemia)   . Neuropathy of both feet ([place])   . GERD (gastroesophageal reflux disease)   . History of kidney stones   . Arthritis   . Depression     Past Surgical History  Procedure Laterality Date  . Abdominal hysterectomy    . Breast lumpectomy    . Joint replacement Right     Hip  . Cataract extraction    . Cholecystectomy    . Knee surgery Right     Family History  Problem Relation Age of Onset  . Stroke Mother   . Diabetes Father   . Heart disease Father   . Kidney disease Neg Hx   . Bladder Cancer Neg Hx     Social History   Social History  . Marital Status: Married    Spouse Name: N/A  . Number of Children: N/A  . Years of Education: N/A   Occupational History  . Not on file.   Social History Main Topics  . Smoking status: Former Research scientist (life sciences)  . Smokeless tobacco: Never Used     Comment: smoked in high school for a few years only  . Alcohol Use: No  . Drug Use: No  . Sexual Activity: Not Currently   Other Topics Concern  . Not on file   Social History Narrative     Current outpatient prescriptions:  .  Cholecalciferol 2000 UNITS TABS, Take 1 tablet by mouth daily., Disp: , Rfl:  .  fluticasone (FLONASE) 50 MCG/ACT nasal spray, Place 2  sprays into both nostrils at bedtime., Disp: , Rfl:  .  furosemide (LASIX) 20 MG tablet, Take 1 tablet (20 mg total) by mouth daily., Disp: 30 tablet, Rfl: 3 .  LORazepam (ATIVAN) 0.5 MG tablet, Take 1 tablet (0.5 mg total) by mouth 2 (two) times daily as needed for anxiety., Disp: 60 tablet, Rfl: 2 .  losartan (COZAAR) 100 MG tablet, Take 1 tablet (100 mg total) by mouth daily., Disp: 30 tablet, Rfl: 3 .  lovastatin (MEVACOR) 40 MG tablet, Take 1 tablet (40 mg total) by mouth daily., Disp: 30 tablet, Rfl: 3 .  metoprolol (LOPRESSOR) 50 MG tablet, TAKE 1 TABLET(50 MG) BY MOUTH TWICE DAILY, Disp: 60 tablet, Rfl: 0 .  Multiple Vitamins-Minerals (PRESERVISION/LUTEIN) CAPS, Take 1 each by mouth 2 (two) times daily., Disp: , Rfl:  .  Polyethylene Glycol 1450 LIQD, POLYETHYLENE GLYCOL (Powder) - Historical Medication  17 grams daily Active, Disp: , Rfl:  .  traMADol (ULTRAM) 50 MG tablet, Take 1 tablet (50 mg total) by mouth 2 (two) times daily., Disp: 60 tablet, Rfl: 0  Allergies  Allergen  Reactions  . 2,4-D Dimethylamine (Amisol) Other (See Comments)    Other Reaction: OTHER REACTION-IRRITATION TO Esophagous  . Celebrex [Celecoxib]     Esophageal spasms    . Ciprofloxacin   . Ibuprofen   . Metronidazole Other (See Comments)    neuropathy  . Other Other (See Comments)    Other Reaction: esophagitis     Review of Systems  Constitutional: Negative for fever, chills and malaise/fatigue.  Respiratory: Negative for cough, sputum production and shortness of breath.   Cardiovascular: Positive for leg swelling. Negative for chest pain and palpitations.   Objective  Filed Vitals:   10/20/15 1429  BP: 140/71  Pulse: 68  Temp: 97.9 F (36.6 C)  TempSrc: Oral  Resp: 15  Height: 5\' 6"  (1.676 m)  Weight: 169 lb 12.8 oz (77.021 kg)  SpO2: 95%    Physical Exam  Constitutional: She is oriented to person, place, and time and well-developed, well-nourished, and in no distress.  HENT:  Head:  Normocephalic and atraumatic.  Cardiovascular: Normal rate and regular rhythm.   No murmur heard. Pulmonary/Chest: Effort normal and breath sounds normal. She has no wheezes.  Musculoskeletal: She exhibits tenderness (bilateral lower extremity tenderness).  Neurological: She is alert and oriented to person, place, and time.  Nursing note and vitals reviewed.      Assessment & Plan  1. Chronic systolic heart failure ([place]) Being followed by cardiology, ejection fraction 40-45%.  2. Essential hypertension Reassured, blood pressure is stable and improved. Anxiety and stress is likely a component of elevated blood pressure. Follow-up as scheduled   [facility] 10/20/2015 2:41 PM

## 2015-10-29 ENCOUNTER — Telehealth: Payer: Self-pay | Admitting: Cardiovascular Disease

## 2015-10-29 NOTE — Telephone Encounter (Signed)
Pt calling stating one of her medications is giving her some issues She states the past few days she's really tired and weak.  Would like to know what can we do or change  Please advise.

## 2015-10-29 NOTE — Telephone Encounter (Signed)
Pt reports being weak, tired, and not feeling well for the past few days. I asked her to further describe sx but she could not elaborate only stating "I haven't felt well". She is able to ambulate independently. Denies fever or aching.  She denies CP, SOB, or any additional LE swelling.  She does not take BP regularly but two days ago reports SBP 140ish. She is unsure of DBP.  She weighs herself daily which fluctuates 165-167lbs and takes lasix 20mg  qd. Pt reports she has been coughing up phlegm since before December. PCP is aware and gave her medication for sinuses which she states did not work and states he is aware.  Reviewed medications. She confirmed she is taking all w/no missed doses. Advised to monitor and record daily BP and weight, elevated legs several times a day, follow low sodium diet and stay out of the heat. She is agreeable to f/u w/PCP regarding other sx. She verbalized understanding, is agreeable w/plan and had no further questions at this time.

## 2015-10-29 NOTE — Telephone Encounter (Signed)
Left message on machine for patient to contact the office.   

## 2015-11-05 ENCOUNTER — Telehealth: Payer: Self-pay

## 2015-11-05 DIAGNOSIS — M7918 Myalgia, other site: Secondary | ICD-10-CM

## 2015-11-05 MED ORDER — TRAMADOL HCL 50 MG PO TABS: 50.0000 mg | ORAL_TABLET | Freq: Two times a day (BID) | ORAL | Status: DC

## 2015-11-05 MED ORDER — METOPROLOL TARTRATE 50 MG PO TABS: ORAL_TABLET | ORAL | Status: DC

## 2015-11-05 NOTE — Telephone Encounter (Signed)
Metoprolol 50 mg has been refilled and sent to [facility] [address] and Tramadol prescription is ready for patient pick up per Dr. Manuella Ghazi

## 2015-11-10 ENCOUNTER — Telehealth: Payer: Self-pay | Admitting: Family Medicine

## 2015-11-10 DIAGNOSIS — F419 Anxiety disorder, unspecified: Secondary | ICD-10-CM

## 2015-11-10 NOTE — Telephone Encounter (Signed)
Requesting refill on Lorazepam 0.5 mg with quanity of 60. Please send to [facility]. Would like to have this by Friday cause she only have enough for the rest of this week

## 2015-11-12 MED ORDER — LORAZEPAM 0.5 MG PO TABS: 0.5000 mg | ORAL_TABLET | Freq: Two times a day (BID) | ORAL | Status: DC | PRN

## 2015-11-12 NOTE — Telephone Encounter (Signed)
Prescription for Lorazepam is ready for pickup. As it is a controlled substance, it cannot be transmitted electronically to the pharmacy

## 2015-11-15 NOTE — Telephone Encounter (Signed)
Patient verbally informed and will pick up the prescription today.

## 2015-11-19 ENCOUNTER — Ambulatory Visit
Admission: RE | Admit: 2015-11-19 | Discharge: 2015-11-19 | Disposition: A | Discharge status: Home / Self Care | Payer: Medicare Other | Source: Ambulatory Visit | Attending: Family Medicine | Admitting: Family Medicine

## 2015-11-19 ENCOUNTER — Ambulatory Visit (INDEPENDENT_AMBULATORY_CARE_PROVIDER_SITE_OTHER): Payer: Medicare Other | Admitting: Family Medicine

## 2015-11-19 ENCOUNTER — Telehealth: Payer: Self-pay

## 2015-11-19 ENCOUNTER — Encounter: Payer: Self-pay | Admitting: Family Medicine

## 2015-11-19 VITALS — BP 140/76 | HR 82 | Temp 98.5°F | Resp 17 | Ht 66.0 in | Wt 170.8 lb

## 2015-11-19 DIAGNOSIS — R918 Other nonspecific abnormal finding of lung field: Secondary | ICD-10-CM | POA: Insufficient documentation

## 2015-11-19 DIAGNOSIS — R05 Cough: Secondary | ICD-10-CM

## 2015-11-19 DIAGNOSIS — E785 Hyperlipidemia, unspecified: Secondary | ICD-10-CM

## 2015-11-19 DIAGNOSIS — R058 Other specified cough: Secondary | ICD-10-CM

## 2015-11-19 DIAGNOSIS — I5022 Chronic systolic (congestive) heart failure: Secondary | ICD-10-CM | POA: Diagnosis not present

## 2015-11-19 MED ORDER — GUAIFENESIN-CODEINE 100-10 MG/5ML PO SYRP: 5.0000 mL | ORAL_SOLUTION | Freq: Three times a day (TID) | ORAL | Status: DC | PRN

## 2015-11-19 MED ORDER — LOVASTATIN 40 MG PO TABS: 40.0000 mg | ORAL_TABLET | Freq: Every day | ORAL | Status: DC

## 2015-11-19 NOTE — Telephone Encounter (Signed)
Medication has been refilled and sent to [facility] [address] 

## 2015-11-19 NOTE — Progress Notes (Signed)
Name: Jodi Bullock   MRN: [id number]    DOB: [birth year]   Date:11/19/2015       Progress Note  Subjective  Chief Complaint  Chief Complaint  Patient presents with  . Cough    Cough This is a new problem. The current episode started more than 1 year ago. The cough is productive of sputum (yellowish to greenish mucus, especially when she wakes up in the morning and also builds up during the day). Associated symptoms include headaches. Pertinent negatives include no fever, shortness of breath, sweats or wheezing. Treatments tried: Has been taking Lasix 20 mg daily.  No symptoms concerning for pneumonia, no history of COPD.   Past Medical History  Diagnosis Date  . Cancer ([place]) 1998    breast  . Hypertension   . Breast cancer ([place])   . 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 Hypertension   . HLD (hyperlipidemia)   . Neuropathy of both feet ([place])   . GERD (gastroesophageal reflux disease)   . History of kidney stones   . Arthritis   . Depression     Past Surgical History  Procedure Laterality Date  . Abdominal hysterectomy    . Breast lumpectomy    . Joint replacement Right     Hip  . Cataract extraction    . Cholecystectomy    . Knee surgery Right     Family History  Problem Relation Age of Onset  . Stroke Mother   . Diabetes Father   . Heart disease Father   . Kidney disease Neg Hx   . Bladder Cancer Neg Hx     Social History   Social History  . Marital Status: Married    Spouse Name: N/A  . Number of Children: N/A  . Years of Education: N/A   Occupational History  . Not on file.   Social History Main Topics  . Smoking status: Former Research scientist (life sciences)  . Smokeless tobacco: Never Used     Comment: smoked in high school for a few years only  . Alcohol Use: No  . Drug Use: No  . Sexual Activity: Not Currently   Other Topics Concern  . Not on file   Social History Narrative     Current outpatient prescriptions:  .  acetaminophen-codeine (TYLENOL #3)  300-30 MG tablet, TK 1 T PO Q 6 TO 8 H PRN P, Disp: , Rfl: 0 .  Cholecalciferol 2000 UNITS TABS, Take 1 tablet by mouth daily., Disp: , Rfl:  .  fluticasone (FLONASE) 50 MCG/ACT nasal spray, Place 2 sprays into both nostrils at bedtime., Disp: , Rfl:  .  furosemide (LASIX) 20 MG tablet, Take 1 tablet (20 mg total) by mouth daily., Disp: 30 tablet, Rfl: 3 .  LORazepam (ATIVAN) 0.5 MG tablet, Take 1 tablet (0.5 mg total) by mouth 2 (two) times daily as needed for anxiety., Disp: 60 tablet, Rfl: 2 .  losartan (COZAAR) 100 MG tablet, Take 1 tablet (100 mg total) by mouth daily., Disp: 30 tablet, Rfl: 3 .  metoprolol (LOPRESSOR) 50 MG tablet, TAKE 1 TABLET(50 MG) BY MOUTH TWICE DAILY, Disp: 60 tablet, Rfl: 1 .  Multiple Vitamins-Minerals (PRESERVISION/LUTEIN) CAPS, Take 1 each by mouth 2 (two) times daily., Disp: , Rfl:  .  Polyethylene Glycol 1450 LIQD, POLYETHYLENE GLYCOL (Powder) - Historical Medication  17 grams daily Active, Disp: , Rfl:  .  traMADol (ULTRAM) 50 MG tablet, Take 1 tablet (50 mg total) by mouth 2 (  two) times daily., Disp: 60 tablet, Rfl: 0 .  lovastatin (MEVACOR) 40 MG tablet, Take 1 tablet (40 mg total) by mouth daily., Disp: 90 tablet, Rfl: 0  Allergies  Allergen Reactions  . 2,4-D Dimethylamine (Amisol) Other (See Comments)    Other Reaction: OTHER REACTION-IRRITATION TO Esophagous  . Celebrex [Celecoxib]     Esophageal spasms    . Ciprofloxacin   . Ibuprofen   . Metronidazole Other (See Comments)    neuropathy  . Other Other (See Comments)    Other Reaction: esophagitis     Review of Systems  Constitutional: Negative for fever.  Respiratory: Positive for cough. Negative for shortness of breath and wheezing.   Neurological: Positive for headaches.     Objective  Filed Vitals:   11/19/15 1055  BP: 140/76  Pulse: 82  Temp: 98.5 F (36.9 C)  TempSrc: Oral  Resp: 17  Height: 5\' 6"  (1.676 m)  Weight: 170 lb 12.8 oz (77.474 kg)  SpO2: 93%    Physical Exam   Constitutional: She is well-developed, well-nourished, and in no distress.  Cardiovascular: Normal rate, regular rhythm and normal heart sounds.   Pulmonary/Chest: Effort normal. No respiratory distress. She has no decreased breath sounds. She has no wheezes. She has rales in the right lower field.  Musculoskeletal:       Right ankle: She exhibits swelling (2+ pitting edema bilateral LE). Tenderness.       Left ankle: She exhibits swelling. Tenderness.  Nursing note and vitals reviewed.        Assessment & Plan  1. Productive cough Suspect coughing as a symptom of chronic systolic heart failure. Obtain chest x-ray to rule out pneumonia - guaiFENesin-codeine (CHERATUSSIN AC) 100-10 MG/5ML syrup; Take 5 mLs by mouth 3 (three) times daily as needed for cough.  Dispense: 118 mL; Refill: 0 - DG Chest 2 View; Future  2. Chronic systolic heart failure [facility]) Patient follows up with cardiology, Dr. Fletcher Anon.   Lavere Stork [facility] 11/19/2015 11:19 AM

## 2015-12-02 ENCOUNTER — Telehealth: Payer: Self-pay | Admitting: Family Medicine

## 2015-12-02 NOTE — Telephone Encounter (Signed)
PT NEEDS REFILL ON LOSARTAN 100MG . [place]

## 2015-12-04 ENCOUNTER — Other Ambulatory Visit: Payer: Self-pay | Admitting: Family Medicine

## 2015-12-07 NOTE — Telephone Encounter (Signed)
Patient called today wanting to know the status of her Losartan refill request.  Pharmacy sent request on 12/04/15 and patient is now out of her medications.

## 2015-12-09 ENCOUNTER — Telehealth: Payer: Self-pay | Admitting: Family Medicine

## 2015-12-09 DIAGNOSIS — F419 Anxiety disorder, unspecified: Secondary | ICD-10-CM

## 2015-12-09 MED ORDER — LORAZEPAM 0.5 MG PO TABS: 0.5000 mg | ORAL_TABLET | Freq: Two times a day (BID) | ORAL | 2 refills | Status: DC | PRN

## 2015-12-09 NOTE — Telephone Encounter (Signed)
Losartan has already been refilled on 12/07/2015. Lorazepam is ready for pickup

## 2015-12-10 ENCOUNTER — Telehealth: Payer: Self-pay | Admitting: Family Medicine

## 2015-12-10 DIAGNOSIS — M7918 Myalgia, other site: Secondary | ICD-10-CM

## 2015-12-10 MED ORDER — TRAMADOL HCL 50 MG PO TABS: 50.0000 mg | ORAL_TABLET | Freq: Two times a day (BID) | ORAL | 0 refills | Status: DC

## 2015-12-28 NOTE — Telephone Encounter (Signed)
COMPLETE

## 2015-12-29 ENCOUNTER — Telehealth: Payer: Self-pay | Admitting: Family Medicine

## 2015-12-30 MED ORDER — LOSARTAN POTASSIUM 100 MG PO TABS: 100.0000 mg | ORAL_TABLET | Freq: Every day | ORAL | 0 refills | Status: DC

## 2015-12-30 NOTE — Telephone Encounter (Signed)
Prescription for losartan has been sent to patient's pharmacy

## 2015-12-31 NOTE — Telephone Encounter (Signed)
LEFT MESSAGE RX IS AT THE PHARM FOR HER AND HER HUSBAND

## 2016-01-10 ENCOUNTER — Ambulatory Visit: Payer: Medicare Other | Admitting: Family Medicine

## 2016-01-10 ENCOUNTER — Ambulatory Visit
Admission: RE | Admit: 2016-01-10 | Discharge: 2016-01-10 | Disposition: A | Discharge status: Home / Self Care | Payer: Medicare Other | Source: Ambulatory Visit | Attending: Family Medicine | Admitting: Family Medicine

## 2016-01-10 ENCOUNTER — Encounter: Payer: Self-pay | Admitting: Family Medicine

## 2016-01-10 ENCOUNTER — Ambulatory Visit (INDEPENDENT_AMBULATORY_CARE_PROVIDER_SITE_OTHER): Payer: Medicare Other | Admitting: Family Medicine

## 2016-01-10 VITALS — BP 134/76 | HR 66 | Temp 97.8°F | Resp 16 | Ht 66.0 in | Wt 170.1 lb

## 2016-01-10 DIAGNOSIS — I5042 Chronic combined systolic (congestive) and diastolic (congestive) heart failure: Secondary | ICD-10-CM

## 2016-01-10 DIAGNOSIS — M11262 Other chondrocalcinosis, left knee: Secondary | ICD-10-CM | POA: Insufficient documentation

## 2016-01-10 DIAGNOSIS — Z96659 Presence of unspecified artificial knee joint: Secondary | ICD-10-CM | POA: Insufficient documentation

## 2016-01-10 DIAGNOSIS — G8929 Other chronic pain: Secondary | ICD-10-CM | POA: Diagnosis not present

## 2016-01-10 DIAGNOSIS — M25562 Pain in left knee: Secondary | ICD-10-CM

## 2016-01-10 DIAGNOSIS — I739 Peripheral vascular disease, unspecified: Secondary | ICD-10-CM | POA: Insufficient documentation

## 2016-01-10 DIAGNOSIS — I10 Essential (primary) hypertension: Secondary | ICD-10-CM

## 2016-01-10 DIAGNOSIS — M25561 Pain in right knee: Secondary | ICD-10-CM | POA: Diagnosis present

## 2016-01-10 DIAGNOSIS — I5022 Chronic systolic (congestive) heart failure: Secondary | ICD-10-CM

## 2016-01-10 DIAGNOSIS — M8588 Other specified disorders of bone density and structure, other site: Secondary | ICD-10-CM | POA: Diagnosis not present

## 2016-01-10 DIAGNOSIS — M1712 Unilateral primary osteoarthritis, left knee: Secondary | ICD-10-CM | POA: Diagnosis not present

## 2016-01-10 DIAGNOSIS — I5020 Unspecified systolic (congestive) heart failure: Secondary | ICD-10-CM | POA: Insufficient documentation

## 2016-01-10 HISTORY — DX: Chronic combined systolic (congestive) and diastolic (congestive) heart failure: I50.42

## 2016-01-10 MED ORDER — TRAMADOL HCL 50 MG PO TABS: 50.0000 mg | ORAL_TABLET | Freq: Two times a day (BID) | ORAL | 0 refills | Status: DC

## 2016-01-10 MED ORDER — METOPROLOL TARTRATE 50 MG PO TABS: ORAL_TABLET | ORAL | 1 refills | Status: DC

## 2016-01-10 NOTE — Progress Notes (Signed)
Name: Jodi Bullock   MRN: [id number]    DOB: 18-Apr-1925   Date:01/10/2016       Progress Note  Subjective  Chief Complaint  Chief Complaint  Patient presents with  . Medication Refill    tramadol / metoprolol     Associated symptoms include coughing. Pertinent negatives include no chest pain, fever or headaches.  Hypertension  This is a chronic problem. The problem is controlled. Pertinent negatives include no blurred vision, chest pain, headaches, palpitations or shortness of breath. Past treatments include beta blockers. Hypertensive end-organ damage includes CAD/MI and heart failure. There is no history of kidney disease or CVA.  Arthritis  Presents for initial visit. She complains of pain and stiffness. The symptoms have been stable. Affected locations include the right knee and left knee. Her pain is at a severity of 7/10. Pertinent negatives include no fever. Past treatments include an opioid and acetaminophen. Factors aggravating her arthritis include activity and sitting. Compliance with prior treatments has been good.  Cough  This is a recurrent problem. The current episode started more than 1 year ago. The problem has been gradually worsening. The cough is productive of sputum. Pertinent negatives include no chest pain, ear congestion, fever, headaches, nasal congestion or shortness of breath.     Past Medical History:  Diagnosis Date  . Arthritis   . Breast cancer ([place])   . Cancer ([place]) 1998   breast  . Depression   . GERD (gastroesophageal reflux disease)   . History of kidney stones   . HLD (hyperlipidemia)   . Hypertension   . Hypertension   . 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 Neuropathy of both feet [facility])     Past Surgical History:  Procedure Laterality Date  . ABDOMINAL HYSTERECTOMY    . BREAST LUMPECTOMY    . CATARACT EXTRACTION    . CHOLECYSTECTOMY    . JOINT REPLACEMENT Right    Hip  . KNEE SURGERY Right     Family History  Problem  Relation Age of Onset  . Stroke Mother   . Diabetes Father   . Heart disease Father   . Kidney disease Neg Hx   . Bladder Cancer Neg Hx     Social History   Social History  . Marital status: Married    Spouse name: N/A  . Number of children: N/A  . Years of education: N/A   Occupational History  . Not on file.   Social History Main Topics  . Smoking status: Former Research scientist (life sciences)  . Smokeless tobacco: Never Used     Comment: smoked in high school for a few years only  . Alcohol use No  . Drug use: No  . Sexual activity: Not Currently   Other Topics Concern  . Not on file   Social History Narrative  . No narrative on file     Current Outpatient Prescriptions:  .  Cholecalciferol 2000 UNITS TABS, Take 1 tablet by mouth daily., Disp: , Rfl:  .  fluticasone (FLONASE) 50 MCG/ACT nasal spray, Place 2 sprays into both nostrils at bedtime., Disp: , Rfl:  .  furosemide (LASIX) 20 MG tablet, Take 1 tablet (20 mg total) by mouth daily., Disp: 30 tablet, Rfl: 3 .  guaiFENesin-codeine (CHERATUSSIN AC) 100-10 MG/5ML syrup, Take 5 mLs by mouth 3 (three) times daily as needed for cough., Disp: 118 mL, Rfl: 0 .  LORazepam (ATIVAN) 0.5 MG tablet, Take 1 tablet (0.5 mg total) by mouth  2 (two) times daily as needed for anxiety., Disp: 60 tablet, Rfl: 2 .  losartan (COZAAR) 100 MG tablet, Take 1 tablet (100 mg total) by mouth daily., Disp: 90 tablet, Rfl: 0 .  lovastatin (MEVACOR) 40 MG tablet, Take 1 tablet (40 mg total) by mouth daily., Disp: 90 tablet, Rfl: 0 .  metoprolol (LOPRESSOR) 50 MG tablet, TAKE 1 TABLET(50 MG) BY MOUTH TWICE DAILY, Disp: 60 tablet, Rfl: 1 .  Multiple Vitamins-Minerals (PRESERVISION/LUTEIN) CAPS, Take 1 each by mouth 2 (two) times daily., Disp: , Rfl:  .  Polyethylene Glycol 1450 LIQD, POLYETHYLENE GLYCOL (Powder) - Historical Medication  17 grams daily Active, Disp: , Rfl:  .  traMADol (ULTRAM) 50 MG tablet, Take 1 tablet (50 mg total) by mouth 2 (two) times daily., Disp:  60 tablet, Rfl: 0  Allergies  Allergen Reactions  . 2,4-D Dimethylamine (Amisol) Other (See Comments)    Other Reaction: OTHER REACTION-IRRITATION TO Esophagous  . Celebrex [Celecoxib] Other (See Comments)     esophagitis Esophageal spasms    . Ciprofloxacin Other (See Comments)    Esophageal irritation  . Ibuprofen     Other reaction(s): Other (See Comments) reflux  . Metronidazole Other (See Comments)    neuropathy  . Naproxen Other (See Comments)    GI UPSET  . Other Other (See Comments)    Other Reaction: esophagitis    Review of Systems  Constitutional: Negative for fever.  Eyes: Negative for blurred vision.  Respiratory: Positive for cough. Negative for shortness of breath.   Cardiovascular: Negative for chest pain and palpitations.  Musculoskeletal: Positive for arthritis and stiffness.  Neurological: Negative for headaches.    Objective  Vitals:   01/10/16 1053  BP: 134/76  Pulse: 66  Resp: 16  Temp: 97.8 F (36.6 C)  TempSrc: Oral  SpO2: 96%  Weight: 170 lb 1.6 oz (77.2 kg)  Height: 5\' 6"  (1.676 m)    Physical Exam  Constitutional: She is oriented to person, place, and time and well-developed, well-nourished, and in no distress.  HENT:  Head: Normocephalic and atraumatic.  Cardiovascular: Normal rate, regular rhythm, S1 normal, S2 normal and normal heart sounds.   No murmur heard. Pulmonary/Chest: Effort normal. No respiratory distress. She has no decreased breath sounds. She has no wheezes. She has rales in the right lower field.  Abdominal: Soft. Normal appearance and bowel sounds are normal. There is no tenderness.  Musculoskeletal:       Right knee: She exhibits swelling. She exhibits no erythema. Tenderness found. Medial joint line tenderness noted.       Left knee: She exhibits swelling. She exhibits no erythema. Tenderness found. Medial joint line tenderness noted.       Right ankle: She exhibits swelling.       Left ankle: She exhibits  swelling.  1+ pitting edema bilateral lower extremities.  Neurological: She is alert and oriented to person, place, and time.  Psychiatric: Mood, memory, affect and judgment normal.  Nursing note and vitals reviewed.     Assessment & Plan  1. Bilateral chronic knee pain Patient takes tramadol 50 mg twice daily as needed for relief of knee pain, obtain updated x-rays of left and right knee. Completed the handicap placard renewal application - traMADol (ULTRAM) 50 MG tablet; Take 1 tablet (50 mg total) by mouth 2 (two) times daily.  Dispense: 60 tablet; Refill: 0 - DG Knee Complete 4 Views Left; Future - DG Knee Complete 4 Views Right; Future  2. Chronic  systolic congestive heart failure (HCC) It appears that the cough is due to the CHF, patient on diuretic therapy being managed by cardiology. Lung exam is unremarkable except for mild crackles in the right lower lobe, in my opinion, imaging at this time is not necessary. Follow-up with cardiology  3. Essential hypertension BP stable on present antihypertensive therapy - metoprolol (LOPRESSOR) 50 MG tablet; TAKE 1 TABLET(50 MG) BY MOUTH TWICE DAILY  Dispense: 180 tablet; Refill: 1   Shadawn Hanaway [facility] 01/10/2016 11:34 AM

## 2016-01-11 ENCOUNTER — Encounter: Payer: Self-pay | Admitting: Cardiovascular Disease

## 2016-01-11 ENCOUNTER — Ambulatory Visit (INDEPENDENT_AMBULATORY_CARE_PROVIDER_SITE_OTHER): Payer: Medicare Other | Admitting: Cardiovascular Disease

## 2016-01-11 VITALS — BP 140/60 | HR 66 | Ht 66.0 in | Wt 169.5 lb

## 2016-01-11 DIAGNOSIS — I493 Ventricular premature depolarization: Secondary | ICD-10-CM

## 2016-01-11 DIAGNOSIS — I5022 Chronic systolic (congestive) heart failure: Secondary | ICD-10-CM

## 2016-01-11 NOTE — Patient Instructions (Addendum)
Medication Instructions: Continue same medications.   Labwork: None.   Procedures/Testing: None.   Follow-Up: 4 months with Dr. Alexandrea Westergard.   Any Additional Special Instructions Will Be Listed Below (If Applicable).     If you need a refill on your cardiac medications before your next appointment, please call your pharmacy.   

## 2016-01-11 NOTE — Progress Notes (Signed)
Cardiology Office Note   Date:  01/11/2016   ID:  Jodi Bullock, DOB 10/25/1924, MRN [id number]  PCP:  Keith Rake, MD  Cardiologist:   Kathlyn Sacramento, MD   Chief Complaint  Patient presents with  . Other    3 month follow up. Meds reviewed by the patient verbally. "doing well."       History of Present Illness: Jodi Bullock is a 82 y.o. female who is here today for a follow-up visit regarding Chronic systolic heart failure thought to be due to nonischemic cardiomyopathy with no previous cardiac catheterization given age and improvement in symptoms with medical therapy. She has multiple chronic medical conditions that include hypertension, hyperlipidemia, premature beats, thymus cancer status post resection and esophageal spasm. She was evaluated in April 2017 for exertional dyspnea. A pharmacologic nuclear stress test showed no evidence of perfusion defects. Ejection fraction was calculated to be 39% . Echocardiogram showed mildly reduced LV systolic function with an ejection fraction of 40-45% with possible inferior wall hypokinesis, elevated filling pressures and mild to moderate pulmonary hypertension with systolic pulmonary pressure 46 mmHg.  she was treated with small dose furosemide with subsequent improvement in symptoms. Overall, she has been stable from a cardiac standpoint. Her weight has been stable. She continues to complain mostly of dry cough with no orthopnea or PND. She has mild leg edema. She denies any chest pain.  Past Medical History:  Diagnosis Date  . Arthritis   . Breast cancer ([place])   . Cancer ([place]) 1998   breast  . Depression   . GERD (gastroesophageal reflux disease)   . History of kidney stones   . HLD (hyperlipidemia)   . Hypertension   . Hypertension   . 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 Neuropathy of both feet ([place])   . Systolic CHF with reduced left ventricular function, NYHA class 3 ([place]) 01/10/2016    Past Surgical History:    Procedure Laterality Date  . ABDOMINAL HYSTERECTOMY    . BREAST LUMPECTOMY    . CATARACT EXTRACTION    . CHOLECYSTECTOMY    . JOINT REPLACEMENT Right    Hip  . KNEE SURGERY Right      Current Outpatient Prescriptions  Medication Sig Dispense Refill  . Cholecalciferol 2000 UNITS TABS Take 1 tablet by mouth daily.    . fluticasone (FLONASE) 50 MCG/ACT nasal spray Place 2 sprays into both nostrils at bedtime.    . furosemide (LASIX) 20 MG tablet Take 1 tablet (20 mg total) by mouth daily. 30 tablet 3  . LORazepam (ATIVAN) 0.5 MG tablet Take 1 tablet (0.5 mg total) by mouth 2 (two) times daily as needed for anxiety. 60 tablet 2  . losartan (COZAAR) 100 MG tablet Take 1 tablet (100 mg total) by mouth daily. 90 tablet 0  . lovastatin (MEVACOR) 40 MG tablet Take 1 tablet (40 mg total) by mouth daily. 90 tablet 0  . metoprolol (LOPRESSOR) 50 MG tablet TAKE 1 TABLET(50 MG) BY MOUTH TWICE DAILY 180 tablet 1  . Multiple Vitamins-Minerals (PRESERVISION/LUTEIN) CAPS Take 1 each by mouth 2 (two) times daily.    . Polyethylene Glycol 1450 LIQD POLYETHYLENE GLYCOL (Powder) - Historical Medication  17 grams daily Active    . traMADol (ULTRAM) 50 MG tablet Take 1 tablet (50 mg total) by mouth 2 (two) times daily. 60 tablet 0   No current facility-administered medications for this visit.     Allergies:   2,4-d dimethylamine (  amisol); Celebrex [celecoxib]; Ciprofloxacin; Ibuprofen; Metronidazole; Naproxen; and Other    Social History:  The patient  reports that she has quit smoking. She has never used smokeless tobacco. She reports that she does not drink alcohol or use drugs.   Family History:  The patient's family history includes Diabetes in her father; Heart disease in her father; Stroke in her mother.    ROS:  Please see the history of present illness.   Otherwise, review of systems are positive for none.   All other systems are reviewed and negative.    PHYSICAL EXAM: VS:  BP 140/60 (BP  Location: Left Arm, Patient Position: Sitting, Cuff Size: Normal)   Pulse 66   Ht 5\' 6"  (1.676 m)   Wt 169 lb 8 oz (76.9 kg)   BMI 27.36 kg/m  , BMI Body mass index is 27.36 kg/m. GEN: Well nourished, well developed, in no acute distress HEENT: normal Neck: no JVD, carotid bruits, or masses Cardiac: RRR; no murmurs, rubs, or gallops,no edema  Respiratory:  clear to auscultation bilaterally, normal work of breathing GI: soft, nontender, nondistended, + BS MS: no deformity or atrophy Skin: warm and dry, no rash Neuro:  Strength and sensation are intact Psych: euthymic mood, full affect   EKG:  EKG is not ordered today.    Recent Labs: 06/21/2015: Hemoglobin 13.5 08/31/2015: ALT 19; Platelets 179; TSH 1.050 09/22/2015: BUN 14; Creatinine, Ser 0.76; Potassium 4.3; Sodium 143    Lipid Panel    Component Value Date/Time   CHOL 131 06/04/2015 0810   TRIG 119 06/04/2015 0810   HDL 54 06/04/2015 0810   CHOLHDL 2.4 06/04/2015 0810   LDLCALC 53 06/04/2015 0810      Wt Readings from Last 3 Encounters:  01/11/16 169 lb 8 oz (76.9 kg)  01/10/16 170 lb 1.6 oz (77.2 kg)  11/19/15 170 lb 12.8 oz (77.5 kg)        ASSESSMENT AND PLAN:  1.  Chronic systolic heart failure: Ejection fraction was 40-45% with mild to moderate pulmonary hypertension. Dyspnea improved with small dose furosemide and she appears to be euvolemic or only slightly volume overloaded.  Continue treatment with metoprolol and losartan.  2. PVCs: No evidence of premature beats on physical exam. Continue metoprolol.  3. Dry cough: This persisted after her recurrent episodes of pneumonia. I do not think this is due to congestive heart failure. Can consider pulmonary evaluation if symptoms persist.   Disposition:   FU with me in 4 months  Signed,  Kathlyn Sacramento, MD  01/11/2016 3:44 PM    [place]

## 2016-01-23 ENCOUNTER — Emergency Department
Admission: EM | Admit: 2016-01-23 | Discharge: 2016-01-24 | Disposition: A | Discharge status: Home / Self Care | Payer: Medicare Other | Attending: Emergency Medicine | Admitting: Emergency Medicine

## 2016-01-23 DIAGNOSIS — I5022 Chronic systolic (congestive) heart failure: Secondary | ICD-10-CM | POA: Diagnosis not present

## 2016-01-23 DIAGNOSIS — Z85238 Personal history of other malignant neoplasm of thymus: Secondary | ICD-10-CM | POA: Insufficient documentation

## 2016-01-23 DIAGNOSIS — Y92009 Unspecified place in unspecified non-institutional (private) residence as the place of occurrence of the external cause: Secondary | ICD-10-CM | POA: Diagnosis not present

## 2016-01-23 DIAGNOSIS — Z79899 Other long term (current) drug therapy: Secondary | ICD-10-CM | POA: Insufficient documentation

## 2016-01-23 DIAGNOSIS — I110 Hypertensive heart disease with heart failure: Secondary | ICD-10-CM | POA: Diagnosis not present

## 2016-01-23 DIAGNOSIS — Y999 Unspecified external cause status: Secondary | ICD-10-CM | POA: Insufficient documentation

## 2016-01-23 DIAGNOSIS — S63501A Unspecified sprain of right wrist, initial encounter: Secondary | ICD-10-CM | POA: Diagnosis not present

## 2016-01-23 DIAGNOSIS — Z87891 Personal history of nicotine dependence: Secondary | ICD-10-CM | POA: Insufficient documentation

## 2016-01-23 DIAGNOSIS — Y9389 Activity, other specified: Secondary | ICD-10-CM | POA: Insufficient documentation

## 2016-01-23 DIAGNOSIS — Z853 Personal history of malignant neoplasm of breast: Secondary | ICD-10-CM | POA: Insufficient documentation

## 2016-01-23 DIAGNOSIS — S6991XA Unspecified injury of right wrist, hand and finger(s), initial encounter: Secondary | ICD-10-CM | POA: Diagnosis present

## 2016-01-23 DIAGNOSIS — W19XXXA Unspecified fall, initial encounter: Secondary | ICD-10-CM | POA: Insufficient documentation

## 2016-01-23 DIAGNOSIS — T148XXA Other injury of unspecified body region, initial encounter: Secondary | ICD-10-CM

## 2016-01-23 NOTE — ED Triage Notes (Signed)
Pt from [facility] via EMS, pt had an unwitnessed fall tonight, reports she was reaching for an item and then fell, states she just falls alot and " just doesn't know why"

## 2016-01-24 ENCOUNTER — Emergency Department: Payer: Medicare Other

## 2016-01-24 DIAGNOSIS — S63501A Unspecified sprain of right wrist, initial encounter: Secondary | ICD-10-CM | POA: Diagnosis not present

## 2016-01-24 LAB — CBC WITH DIFFERENTIAL/PLATELET
BASOS ABS: 0 10*3/uL (ref 0–0.1)
BASOS PCT: 1 %
EOS PCT: 8 %
Eosinophils Absolute: 0.6 10*3/uL (ref 0–0.7)
HEMATOCRIT: 40.1 % (ref 35.0–47.0)
Hemoglobin: 14 g/dL (ref 12.0–16.0)
LYMPHS PCT: 29 %
Lymphs Abs: 2.2 10*3/uL (ref 1.0–3.6)
MCH: 31.2 pg (ref 26.0–34.0)
MCHC: 34.8 g/dL (ref 32.0–36.0)
MCV: 89.7 fL (ref 80.0–100.0)
Monocytes Absolute: 0.7 10*3/uL (ref 0.2–0.9)
Monocytes Relative: 9 %
NEUTROS ABS: 4 10*3/uL (ref 1.4–6.5)
Neutrophils Relative %: 53 %
PLATELETS: 236 10*3/uL (ref 150–440)
RBC: 4.48 MIL/uL (ref 3.80–5.20)
RDW: 13.8 % (ref 11.5–14.5)
WBC: 7.4 10*3/uL (ref 3.6–11.0)

## 2016-01-24 LAB — URINALYSIS COMPLETE WITH MICROSCOPIC (ARMC ONLY)
BACTERIA UA: NONE SEEN
Bilirubin Urine: NEGATIVE
Glucose, UA: NEGATIVE mg/dL
HGB URINE DIPSTICK: NEGATIVE
Ketones, ur: NEGATIVE mg/dL
LEUKOCYTES UA: NEGATIVE
Nitrite: NEGATIVE
PH: 8 (ref 5.0–8.0)
PROTEIN: NEGATIVE mg/dL
Specific Gravity, Urine: 1.006 (ref 1.005–1.030)

## 2016-01-24 LAB — COMPREHENSIVE METABOLIC PANEL
ALBUMIN: 3.8 g/dL (ref 3.5–5.0)
ALT: 16 U/L (ref 14–54)
AST: 23 U/L (ref 15–41)
Alkaline Phosphatase: 61 U/L (ref 38–126)
Anion gap: 9 (ref 5–15)
BUN: 21 mg/dL — AB (ref 6–20)
CHLORIDE: 108 mmol/L (ref 101–111)
CO2: 24 mmol/L (ref 22–32)
CREATININE: 0.82 mg/dL (ref 0.44–1.00)
Calcium: 8.9 mg/dL (ref 8.9–10.3)
GFR calc Af Amer: >60 mL/min (ref >60)
GLUCOSE: 147 mg/dL — AB (ref 65–99)
POTASSIUM: 4.2 mmol/L (ref 3.5–5.1)
Sodium: 141 mmol/L (ref 135–145)
Total Bilirubin: 0.3 mg/dL (ref 0.3–1.2)
Total Protein: 6.4 g/dL — ABNORMAL LOW (ref 6.5–8.1)

## 2016-01-24 LAB — TROPONIN I

## 2016-01-24 MED ORDER — ACETAMINOPHEN 325 MG PO TABS
650.0000 mg | ORAL_TABLET | Freq: Once | ORAL | Status: AC
  Administered 2016-01-24: 650 mg via ORAL
  Filled 2016-01-24: qty 2

## 2016-01-24 NOTE — ED Provider Notes (Signed)
[facility] Emergency Department Provider Note   ____________________________________________   First MD Initiated Contact with Patient 01/24/16 [phone]     (approximate)  I have reviewed the triage vital signs and the nursing notes.   HISTORY  Chief Complaint Fall    HPI Jodi Bullock is a 82 y.o. female comes into the hospital today after a fall. The patient reports that she lost her balance and fell and hurt her right wrist. She reports that she did not hit her head and she did not have any loss of consciousness. She was able to get up on her own with her husband's help. She denies any dizziness chest pain or shortness of breath. She reports that she doesn't have good balance and she falls all of the time. She reports that she does have a cane that she uses but will likely not be able to use it now that her wrist hurts. The patient reports that her pain is a 6 out of 10 in intensity in her wrist. She reports she just wanted to get checked out today. She denies pain in any other location.   Past Medical History:  Diagnosis Date  . Arthritis   . Breast cancer ([place])   . Cancer ([place]) 1998   breast  . Depression   . GERD (gastroesophageal reflux disease)   . History of kidney stones   . HLD (hyperlipidemia)   . Hypertension   . Hypertension   . 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 Neuropathy of both feet ([place])   . Systolic CHF with reduced left ventricular function, NYHA class 3 ([place]) 01/10/2016    Patient Active Problem List   Diagnosis Date Noted  . Systolic CHF with reduced left ventricular function, NYHA class 3 ([place]) 01/10/2016  . Productive cough 11/19/2015  . Chronic systolic heart failure ([place]) 10/12/2015  . Dyspnea on exertion 08/31/2015  . Fatigue 08/31/2015  . 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[place]) 08/04/2015  . Irregular heart rhythm 07/22/2015  . Left arm pain 06/29/2015  . Dyslipidemia 06/03/2015  . Oral mucosal lesion 05/26/2015  . Urge  incontinence 02/14/2015  . Ankle edema 02/02/2015  . At risk for falling 01/25/2015  . Dizziness 01/25/2015  . Acid reflux 01/25/2015  . Big thyroid 01/25/2015  . Gravida 2 para 2 01/25/2015  . Personal history of malignant neoplasm of breast 01/25/2015  . Arthritis, degenerative 01/25/2015  . Parity 2 01/25/2015  . Peripheral blood vessel disorder ([place]) 01/25/2015  . Need for vaccination 01/25/2015  . Pain in rectum 01/25/2015  . Reflux 01/25/2015  . Screening for depression 01/25/2015  . Disease of accessory sinus 01/25/2015  . Headache, temporal 01/25/2015  . Primary cancer of thymus ([place]) 01/25/2015  . Disease of thyroid gland 01/25/2015  . Avitaminosis D 01/25/2015  . Family history of diabetes mellitus in father 12/31/2014  . Fasting hyperglycemia 12/31/2014  . Hypertension 12/01/2014  . HLD (hyperlipidemia) 12/01/2014  . Anxiety 12/01/2014  . Myofascial pain 12/01/2014  . Breast CA ([place]) 11/24/2014  . Absence of bladder continence 11/24/2014  . Urgency of micturation 11/24/2014  . Common bile duct dilatation 10/20/2014  . Pancreatic cyst 10/20/2014  . Kidney stones 10/20/2014  . Neuritis or radiculitis due to rupture of lumbar intervertebral disc 06/15/2014  . Lumbar canal stenosis 06/15/2014  . Degenerative arthritis of lumbar spine 06/15/2014  . 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 Bloodgood disease 11/05/2012  . Arthritis of temporomandibular joint 06/24/2012  . Atypical  chest pain 01/05/2012  . Breath shortness 12/04/2011  . Lung mass 11/15/2011  . Thyroid nodule 11/15/2011  . Age-related macular degeneration, dry 10/03/2011  . Disorder of peripheral nervous system ([place]) 10/03/2011  . Chronic rhinitis 03/03/2011  . Carotid artery narrowing 03/01/2011  . Polypharmacy 12/02/2010    Past Surgical History:  Procedure Laterality Date  . ABDOMINAL HYSTERECTOMY    . BREAST LUMPECTOMY    . CATARACT EXTRACTION    . CHOLECYSTECTOMY    . JOINT REPLACEMENT  Right    Hip  . KNEE SURGERY Right     Prior to Admission medications   Medication Sig Start Date End Date Taking? Authorizing Provider  Cholecalciferol 2000 UNITS TABS Take 1 tablet by mouth daily.    Roselee Nova, MD  fluticasone (FLONASE) 50 MCG/ACT nasal spray Place 2 sprays into both nostrils at bedtime.    Historical Provider, MD  furosemide (LASIX) 20 MG tablet Take 1 tablet (20 mg total) by mouth daily. 09/15/15   Wellington Hampshire, MD  LORazepam (ATIVAN) 0.5 MG tablet Take 1 tablet (0.5 mg total) by mouth 2 (two) times daily as needed for anxiety. 12/09/15   Roselee Nova, MD  losartan (COZAAR) 100 MG tablet Take 1 tablet (100 mg total) by mouth daily. 12/30/15   Roselee Nova, MD  lovastatin (MEVACOR) 40 MG tablet Take 1 tablet (40 mg total) by mouth daily. 11/19/15   Roselee Nova, MD  metoprolol (LOPRESSOR) 50 MG tablet TAKE 1 TABLET(50 MG) BY MOUTH TWICE DAILY 01/10/16   Roselee Nova, MD  Multiple Vitamins-Minerals (PRESERVISION/LUTEIN) CAPS Take 1 each by mouth 2 (two) times daily.    Historical Provider, MD  Polyethylene Glycol 1450 LIQD POLYETHYLENE GLYCOL (Powder) - Historical Medication  17 grams daily Active    Roselee Nova, MD  traMADol (ULTRAM) 50 MG tablet Take 1 tablet (50 mg total) by mouth 2 (two) times daily. 01/10/16   Roselee Nova, MD    Allergies 2,4-d dimethylamine (amisol); Celebrex [celecoxib]; Ciprofloxacin; Ibuprofen; Metronidazole; Naproxen; and Other  Family History  Problem Relation Age of Onset  . Stroke Mother   . Diabetes Father   . Heart disease Father   . Kidney disease Neg Hx   . Bladder Cancer Neg Hx     Social History Social History  Substance Use Topics  . Smoking status: Former Research scientist (life sciences)  . Smokeless tobacco: Never Used     Comment: smoked in high school for a few years only  . Alcohol use No    Review of Systems Constitutional: No fever/chills Eyes: No visual changes. ENT: No sore throat. Cardiovascular: Denies  chest pain. Respiratory: Denies shortness of breath. Gastrointestinal: No abdominal pain.  No nausea, no vomiting.  No diarrhea.  No constipation. Genitourinary: Negative for dysuria. Musculoskeletal: Right wrist pain Skin: Negative for rash. Neurological: Negative for headaches, focal weakness or numbness.  10-point ROS otherwise negative.  ____________________________________________   PHYSICAL EXAM:  VITAL SIGNS: ED Triage Vitals  Enc Vitals Group     BP 01/23/16 2140 (!) 195/78     Pulse Rate 01/23/16 2140 76     Resp 01/23/16 2330 15     Temp 01/23/16 2141 97.7 F (36.5 C)     Temp src --      SpO2 01/23/16 2140 97 %     Weight 01/23/16 2141 166 lb (75.3 kg)     Height 01/23/16 2141 5\' 6"  (1.676  m)     Head Circumference --      Peak Flow --      Pain Score 01/23/16 2141 4     Pain Loc --      Pain Edu? --      Excl. in [place]? --     Constitutional: Alert and oriented. Well appearing and in Mild distress. Eyes: Conjunctivae are normal. PERRL. EOMI. Head: Atraumatic. Nose: No congestion/rhinnorhea. Mouth/Throat: Mucous membranes are moist.  Oropharynx non-erythematous. Neck: No cervical spine tenderness to palpation. Cardiovascular: Normal rate, regular rhythm. Grossly normal heart sounds.  Good peripheral circulation. Respiratory: Normal respiratory effort.  No retractions. Lungs CTAB. Gastrointestinal: Soft and nontender. No distention. Positive bowel sounds Musculoskeletal: No lower extremity tenderness nor edema.   Neurologic:  Normal speech and language.  Skin:  Skin is warm, dry and intact.  Psychiatric: Mood and affect are normal.   ____________________________________________   LABS (all labs ordered are listed, but only abnormal results are displayed)  Labs Reviewed  COMPREHENSIVE METABOLIC PANEL - Abnormal; Notable for the following:       Result Value   Glucose, Bld 147 (*)    BUN 21 (*)    Total Protein 6.4 (*)    All other components within  normal limits  URINALYSIS COMPLETEWITH MICROSCOPIC ([facility] ONLY) - Abnormal; Notable for the following:    Color, Urine STRAW (*)    APPearance HAZY (*)    Squamous Epithelial / LPF 0-5 (*)    All other components within normal limits  CBC WITH DIFFERENTIAL/PLATELET  TROPONIN I   ____________________________________________  EKG  ED ECG REPORT I, Loney Hering, the attending physician, personally viewed and interpreted this ECG.   Date: 01/23/2016  EKG Time: 2145  Rate: 69  Rhythm: normal sinus rhythm  Axis: normal  Intervals:none  ST&T Change: none  ____________________________________________  RADIOLOGY  Right wrist x-ray ____________________________________________   PROCEDURES  Procedure(s) performed: None  Procedures  Critical Care performed: No  ____________________________________________   INITIAL IMPRESSION / ASSESSMENT AND PLAN / ED COURSE  Pertinent labs & imaging results that were available during my care of the patient were reviewed by me and considered in my medical decision making (see chart for details).  This is an 82 year old female who comes into the hospital today after a fall at home. The patient reports that she just lost her balance and didn't have any dizziness headache chest pain shortness of breath or any other cause for her fall. She reports that she falls often. The patient also denies hitting her head. I did do an x-ray of the patient's wrist and the patient also had some blood work drawn. I will give the patient some Tylenol for pain and she will be reassessed.  Clinical Course  Value Comment By Time  DG Wrist Complete Right 1. No acute fracture or dislocation. 2. Sequela of prior ORIF at the distal right radius without hardware complication.   Loney Hering, MD 09/11 0157   The patient's x-ray is unremarkable. She'll be placed in the pre-fabricated splint and discharged to follow-up with her primary care physician. The  patient has no further questions or complaints at this time. She'll be discharged to home.  ____________________________________________   FINAL CLINICAL IMPRESSION(S) / ED DIAGNOSES  Final diagnoses:  Fall, initial encounter  Wrist sprain, right, initial encounter  Contusion      NEW MEDICATIONS STARTED DURING THIS VISIT:  New Prescriptions   No medications on file  Note:  This document was prepared using Dragon voice recognition software and may include unintentional dictation errors.    Loney Hering, MD 01/24/16 0201

## 2016-01-24 NOTE — Discharge Instructions (Signed)
Please follow-up with your primary care physician. Ear imaging studies do not show any acute fracture at this time. I will place her in a prefabricated splint for comfort. He may take Tylenol for pain at home. Should you have any worsening symptoms or any chest pain, dizziness, lightheadedness, shortness of breath please return to the emergency department immediately.

## 2016-01-26 ENCOUNTER — Encounter: Payer: Self-pay | Admitting: Family Medicine

## 2016-01-26 ENCOUNTER — Ambulatory Visit (INDEPENDENT_AMBULATORY_CARE_PROVIDER_SITE_OTHER): Payer: Medicare Other | Admitting: Family Medicine

## 2016-01-26 DIAGNOSIS — S63501D Unspecified sprain of right wrist, subsequent encounter: Secondary | ICD-10-CM

## 2016-01-26 DIAGNOSIS — W19XXXD Unspecified fall, subsequent encounter: Secondary | ICD-10-CM

## 2016-01-26 DIAGNOSIS — S63501A Unspecified sprain of right wrist, initial encounter: Secondary | ICD-10-CM | POA: Insufficient documentation

## 2016-01-26 DIAGNOSIS — IMO0001 Reserved for inherently not codable concepts without codable children: Secondary | ICD-10-CM

## 2016-01-26 NOTE — Progress Notes (Signed)
Name: Jodi Bullock   MRN: [id number]    DOB: 02-04-1925   Date:01/26/2016       Progress Note  Subjective  Chief Complaint  Chief Complaint  Patient presents with  . Hospitalization Follow-up    pt fell on Sunday night    Fall  Incident onset: 3 days ago. The fall occurred while walking. She landed on carpet. The point of impact was the right wrist and right hip. The pain is present in the right wrist. The pain is moderate. Pertinent negatives include no fever.  Fell at her home Sunday night after she grabbed a folder and turned around to start walking. She lost her balance and fell down towards her right side on carpet, point of impact was right hip, right leg, and right wrist. She was seen in the ER and was diagnosed with acute right wrist sprain, asked to take Tylenol and follow up with PCP. She feels pain in her right wrist, healing bruise on her right  Proximal hand, pain is improving.  X ray of right wrist shows hardware in normal alignment, no acute bony abnormality.  Past Medical History:  Diagnosis Date  . Arthritis   . Breast cancer ([place])   . Cancer ([place]) 1998   breast  . Depression   . GERD (gastroesophageal reflux disease)   . History of kidney stones   . HLD (hyperlipidemia)   . Hypertension   . Hypertension   . 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 Neuropathy of both feet ([place])   . Systolic CHF with reduced left ventricular function, NYHA class 3 ([place]) 01/10/2016    Past Surgical History:  Procedure Laterality Date  . ABDOMINAL HYSTERECTOMY    . BREAST LUMPECTOMY    . CATARACT EXTRACTION    . CHOLECYSTECTOMY    . JOINT REPLACEMENT Right    Hip  . KNEE SURGERY Right     Family History  Problem Relation Age of Onset  . Stroke Mother   . Diabetes Father   . Heart disease Father   . Kidney disease Neg Hx   . Bladder Cancer Neg Hx     Social History   Social History  . Marital status: Married    Spouse name: N/A  . Number of children: N/A   . Years of education: N/A   Occupational History  . Not on file.   Social History Main Topics  . Smoking status: Former Research scientist (life sciences)  . Smokeless tobacco: Never Used     Comment: smoked in high school for a few years only  . Alcohol use No  . Drug use: No  . Sexual activity: Not Currently   Other Topics Concern  . Not on file   Social History Narrative  . No narrative on file     Current Outpatient Prescriptions:  .  Cholecalciferol 2000 UNITS TABS, Take 1 tablet by mouth daily., Disp: , Rfl:  .  fluticasone (FLONASE) 50 MCG/ACT nasal spray, Place 2 sprays into both nostrils at bedtime., Disp: , Rfl:  .  furosemide (LASIX) 20 MG tablet, Take 1 tablet (20 mg total) by mouth daily., Disp: 30 tablet, Rfl: 3 .  LORazepam (ATIVAN) 0.5 MG tablet, Take 1 tablet (0.5 mg total) by mouth 2 (two) times daily as needed for anxiety., Disp: 60 tablet, Rfl: 2 .  losartan (COZAAR) 100 MG tablet, Take 1 tablet (100 mg total) by mouth daily., Disp: 90 tablet, Rfl: 0 .  lovastatin (MEVACOR) 40 MG  tablet, Take 1 tablet (40 mg total) by mouth daily., Disp: 90 tablet, Rfl: 0 .  metoprolol (LOPRESSOR) 50 MG tablet, TAKE 1 TABLET(50 MG) BY MOUTH TWICE DAILY, Disp: 180 tablet, Rfl: 1 .  Multiple Vitamins-Minerals (PRESERVISION/LUTEIN) CAPS, Take 1 each by mouth 2 (two) times daily., Disp: , Rfl:  .  Polyethylene Glycol 1450 LIQD, POLYETHYLENE GLYCOL (Powder) - Historical Medication  17 grams daily Active, Disp: , Rfl:  .  traMADol (ULTRAM) 50 MG tablet, Take 1 tablet (50 mg total) by mouth 2 (two) times daily., Disp: 60 tablet, Rfl: 0  Allergies  Allergen Reactions  . 2,4-D Dimethylamine (Amisol) Other (See Comments)    Other Reaction: OTHER REACTION-IRRITATION TO Esophagous  . Celebrex [Celecoxib] Other (See Comments)     esophagitis Esophageal spasms    . Ciprofloxacin Other (See Comments)    Esophageal irritation  . Ibuprofen     Other reaction(s): Other (See Comments) reflux  . Metronidazole  Other (See Comments)    neuropathy  . Naproxen Other (See Comments)    GI UPSET  . Other Other (See Comments)    Other Reaction: esophagitis     Review of Systems  Constitutional: Negative for chills and fever.  Musculoskeletal: Positive for joint pain.    Objective  Vitals:   01/26/16 1423  BP: (!) 141/73  Pulse: 70  Resp: 16  Temp: 98.4 F (36.9 C)  TempSrc: Oral  SpO2: 93%  Weight: 170 lb 11.2 oz (77.4 kg)  Height: 5\' 6"  (1.676 m)    Physical Exam  Constitutional: She is well-developed, well-nourished, and in no distress.  Cardiovascular: Normal rate, regular rhythm, S1 normal, S2 normal and normal heart sounds.   No murmur heard. Musculoskeletal:       Right wrist: She exhibits decreased range of motion, tenderness, bony tenderness and swelling.  Tenderness to palpation over the right lateral wrist, decreased flexion due to pain, mild generalized inflammation,  Contusion over the right medial hand palmar surface.  Nursing note and vitals reviewed.      Assessment & Plan  1. Sprain of wrist, right, subsequent encounter Continue the wrist splint provided by the ER, bruising should improve within the next few days, range of motion will likely return back to baseline  2. Fall in elderly patient, subsequent encounter   Markevion Lattin Asad A. [facility] 01/26/2016 2:31 PM

## 2016-01-27 DIAGNOSIS — R296 Repeated falls: Secondary | ICD-10-CM | POA: Insufficient documentation

## 2016-02-01 ENCOUNTER — Other Ambulatory Visit: Payer: Self-pay | Admitting: *Deleted

## 2016-02-01 ENCOUNTER — Telehealth: Payer: Self-pay | Admitting: Cardiovascular Disease

## 2016-02-01 MED ORDER — FUROSEMIDE 20 MG PO TABS: 20.0000 mg | ORAL_TABLET | Freq: Every day | ORAL | 3 refills | Status: DC

## 2016-02-01 NOTE — Telephone Encounter (Signed)
Requested Prescriptions   Signed Prescriptions Disp Refills  . furosemide (LASIX) 20 MG tablet 90 tablet 3    Sig: Take 1 tablet (20 mg total) by mouth daily.    Authorizing Provider: Kathlyn Sacramento A    Ordering User: Britt Bottom

## 2016-02-01 NOTE — Telephone Encounter (Signed)
°*  STAT* If patient is at the pharmacy, call can be transferred to refill team.   1. Which medications need to be refilled? (please list name of each medication and dose if known)  Frusemide   2. Which pharmacy/location (including street and city if local pharmacy) is medication to be sent to? [facility] on [address]   3. Do they need a 30 day or 90 day supply? 90 day

## 2016-02-01 NOTE — Telephone Encounter (Signed)
Furosemide 20 mg tablet sent to [facility] pharmacy.

## 2016-02-10 ENCOUNTER — Telehealth: Payer: Self-pay | Admitting: Family Medicine

## 2016-02-10 DIAGNOSIS — M25561 Pain in right knee: Principal | ICD-10-CM

## 2016-02-10 DIAGNOSIS — G8929 Other chronic pain: Secondary | ICD-10-CM

## 2016-02-10 DIAGNOSIS — M25562 Pain in left knee: Principal | ICD-10-CM

## 2016-02-10 DIAGNOSIS — F419 Anxiety disorder, unspecified: Secondary | ICD-10-CM

## 2016-02-10 MED ORDER — LORAZEPAM 0.5 MG PO TABS: 0.5000 mg | ORAL_TABLET | Freq: Two times a day (BID) | ORAL | 2 refills | Status: DC | PRN

## 2016-02-10 MED ORDER — TRAMADOL HCL 50 MG PO TABS: 50.0000 mg | ORAL_TABLET | Freq: Two times a day (BID) | ORAL | 0 refills | Status: DC

## 2016-02-10 NOTE — Telephone Encounter (Signed)
Prescription for tramadol and lorazepam are ready for pickup

## 2016-02-10 NOTE — Telephone Encounter (Signed)
Requesting refill on lorazepam .5mg  and tramadol 50mg . Patient is not completely out yet.

## 2016-02-11 NOTE — Telephone Encounter (Signed)
Left voice mail for patient yesterday and today to inform her that prescription is ready for pick up

## 2016-02-14 ENCOUNTER — Ambulatory Visit (INDEPENDENT_AMBULATORY_CARE_PROVIDER_SITE_OTHER): Payer: Medicare Other | Admitting: Family Medicine

## 2016-02-14 ENCOUNTER — Encounter: Payer: Self-pay | Admitting: Family Medicine

## 2016-02-14 VITALS — BP 130/64 | HR 61 | Temp 97.8°F | Resp 16 | Ht 66.0 in | Wt 166.1 lb

## 2016-02-14 DIAGNOSIS — R739 Hyperglycemia, unspecified: Secondary | ICD-10-CM | POA: Diagnosis not present

## 2016-02-14 DIAGNOSIS — E785 Hyperlipidemia, unspecified: Secondary | ICD-10-CM | POA: Diagnosis not present

## 2016-02-14 LAB — POCT GLYCOSYLATED HEMOGLOBIN (HGB A1C): HEMOGLOBIN A1C: 5.4

## 2016-02-14 MED ORDER — LOVASTATIN 40 MG PO TABS: 40.0000 mg | ORAL_TABLET | Freq: Every day | ORAL | 0 refills | Status: DC

## 2016-02-14 NOTE — Progress Notes (Signed)
Name: Jodi Bullock   MRN: [id number]    DOB: February 08, 1925   Date:02/14/2016       Progress Note  Subjective  Chief Complaint  Chief Complaint  Patient presents with  . Follow-up    Hyperlipidemia  This is a chronic problem. The problem is controlled. Recent lipid tests were reviewed and are normal. Pertinent negatives include no chest pain, leg pain, myalgias or shortness of breath. Current antihyperlipidemic treatment includes statins. Risk factors for coronary artery disease include dyslipidemia.    Past Medical History:  Diagnosis Date  . Arthritis   . Breast cancer ([place])   . Cancer ([place]) 1998   breast  . Depression   . GERD (gastroesophageal reflux disease)   . History of kidney stones   . HLD (hyperlipidemia)   . Hypertension   . Hypertension   . Hypoplasia, thymus gland    cancer  . Neuropathy of both feet   . Systolic CHF with reduced left ventricular function, NYHA class 3 ([place]) 01/10/2016    Past Surgical History:  Procedure Laterality Date  . ABDOMINAL HYSTERECTOMY    . BREAST LUMPECTOMY    . CATARACT EXTRACTION    . CHOLECYSTECTOMY    . JOINT REPLACEMENT Right    Hip  . KNEE SURGERY Right     Family History  Problem Relation Age of Onset  . Stroke Mother   . Diabetes Father   . Heart disease Father   . Kidney disease Neg Hx   . Bladder Cancer Neg Hx     Social History   Social History  . Marital status: Married    Spouse name: N/A  . Number of children: N/A  . Years of education: N/A   Occupational History  . Not on file.   Social History Main Topics  . Smoking status: Former Research scientist (life sciences)  . Smokeless tobacco: Never Used     Comment: smoked in high school for a few years only  . Alcohol use No  . Drug use: No  . Sexual activity: Not Currently   Other Topics Concern  . Not on file   Social History Narrative  . No narrative on file     Current Outpatient Prescriptions:  .  Cholecalciferol 2000 UNITS TABS, Take 1 tablet by mouth daily.,  Disp: , Rfl:  .  fluticasone (FLONASE) 50 MCG/ACT nasal spray, Place 2 sprays into both nostrils at bedtime., Disp: , Rfl:  .  furosemide (LASIX) 20 MG tablet, Take 1 tablet (20 mg total) by mouth daily., Disp: 90 tablet, Rfl: 3 .  LORazepam (ATIVAN) 0.5 MG tablet, Take 1 tablet (0.5 mg total) by mouth 2 (two) times daily as needed for anxiety., Disp: 60 tablet, Rfl: 2 .  losartan (COZAAR) 100 MG tablet, Take 1 tablet (100 mg total) by mouth daily., Disp: 90 tablet, Rfl: 0 .  lovastatin (MEVACOR) 40 MG tablet, Take 1 tablet (40 mg total) by mouth daily., Disp: 90 tablet, Rfl: 0 .  metoprolol (LOPRESSOR) 50 MG tablet, TAKE 1 TABLET(50 MG) BY MOUTH TWICE DAILY, Disp: 180 tablet, Rfl: 1 .  Multiple Vitamins-Minerals (PRESERVISION/LUTEIN) CAPS, Take 1 each by mouth 2 (two) times daily., Disp: , Rfl:  .  Polyethylene Glycol 1450 LIQD, POLYETHYLENE GLYCOL (Powder) - Historical Medication  17 grams daily Active, Disp: , Rfl:  .  traMADol (ULTRAM) 50 MG tablet, Take 1 tablet (50 mg total) by mouth 2 (two) times daily., Disp: 60 tablet, Rfl: 0  Allergies  Allergen Reactions  .  2,4-D Dimethylamine (Amisol) Other (See Comments)    Other Reaction: OTHER REACTION-IRRITATION TO Esophagous  . Celebrex [Celecoxib] Other (See Comments)     esophagitis Esophageal spasms    . Ciprofloxacin Other (See Comments)    Esophageal irritation  . Ibuprofen     Other reaction(s): Other (See Comments) reflux  . Metronidazole Other (See Comments)    neuropathy  . Naproxen Other (See Comments)    GI UPSET  . Other Other (See Comments)    Other Reaction: esophagitis    Review of Systems  Constitutional: Positive for malaise/fatigue. Negative for chills and fever.  Respiratory: Negative for shortness of breath.   Cardiovascular: Negative for chest pain.  Gastrointestinal: Negative for abdominal pain, nausea and vomiting.  Musculoskeletal: Positive for joint pain. Negative for myalgias.    Objective  Vitals:    02/14/16 0908  BP: 130/64  Pulse: 61  Resp: 16  Temp: 97.8 F (36.6 C)  TempSrc: Oral  SpO2: 96%  Weight: 166 lb 1.6 oz (75.3 kg)  Height: 5\' 6"  (1.676 m)    Physical Exam  Constitutional: She is well-developed, well-nourished, and in no distress.  HENT:  Head: Normocephalic and atraumatic.  Cardiovascular: Normal rate, regular rhythm, S1 normal, S2 normal and normal heart sounds.   No murmur heard. Pulmonary/Chest: Breath sounds normal. She has no wheezes.  Abdominal: Soft. Bowel sounds are normal. There is no tenderness.  Psychiatric: Mood, memory, affect and judgment normal.  Nursing note and vitals reviewed.   Assessment & Plan  1. Dyslipidemia  - lovastatin (MEVACOR) 40 MG tablet; Take 1 tablet (40 mg total) by mouth daily.  Dispense: 90 tablet; Refill: 0 - Lipid Profile  2. Hyperglycemia Hemoglobin A1c is 5.4%, considered normal - POCT HgB A1C   [name] [facility] 02/14/2016 9:22 AM

## 2016-02-18 LAB — LIPID PANEL
Cholesterol: 132 mg/dL (ref 125–200)
HDL: 51 mg/dL (ref >=46)
LDL CALC: 56 mg/dL (ref <130)
Total CHOL/HDL Ratio: 2.6 Ratio (ref <=5.0)
Triglycerides: 126 mg/dL (ref <150)
VLDL: 25 mg/dL (ref <30)

## 2016-03-08 ENCOUNTER — Telehealth: Payer: Self-pay | Admitting: Family Medicine

## 2016-03-08 DIAGNOSIS — M25561 Pain in right knee: Principal | ICD-10-CM

## 2016-03-08 DIAGNOSIS — G8929 Other chronic pain: Secondary | ICD-10-CM

## 2016-03-08 DIAGNOSIS — M25562 Pain in left knee: Principal | ICD-10-CM

## 2016-03-08 MED ORDER — TRAMADOL HCL 50 MG PO TABS: 50.0000 mg | ORAL_TABLET | Freq: Two times a day (BID) | ORAL | 0 refills | Status: DC

## 2016-03-08 NOTE — Telephone Encounter (Signed)
Pt needs refill on Tramadol

## 2016-03-08 NOTE — Telephone Encounter (Signed)
Prescription for tramadol is ready for pickup 

## 2016-03-09 NOTE — Telephone Encounter (Signed)
Spoke with patient and she stated that she would be here later this afternoon to pick up of her rx

## 2016-03-15 ENCOUNTER — Telehealth: Payer: Self-pay | Admitting: Family Medicine

## 2016-03-15 NOTE — Telephone Encounter (Signed)
Pt needs refill on Lorazepam.

## 2016-03-16 NOTE — Telephone Encounter (Signed)
Patient was provided prescription for 3 months on 02/10/2016, she is not due for a refill on lorazepam at this time.

## 2016-03-17 NOTE — Telephone Encounter (Signed)
LMOM to inform pt °

## 2016-03-30 ENCOUNTER — Telehealth: Payer: Self-pay | Admitting: Family Medicine

## 2016-03-30 MED ORDER — LOSARTAN POTASSIUM 100 MG PO TABS: 100.0000 mg | ORAL_TABLET | Freq: Every day | ORAL | 0 refills | Status: DC

## 2016-03-30 NOTE — Telephone Encounter (Signed)
Pt needs refill on Losartan to be sent to [facility].

## 2016-03-30 NOTE — Telephone Encounter (Signed)
Medication has been refilled and sent to [facility] [address] 

## 2016-04-03 ENCOUNTER — Telehealth: Payer: Self-pay | Admitting: Family Medicine

## 2016-04-03 DIAGNOSIS — M25561 Pain in right knee: Principal | ICD-10-CM

## 2016-04-03 DIAGNOSIS — G8929 Other chronic pain: Secondary | ICD-10-CM

## 2016-04-03 DIAGNOSIS — M25562 Pain in left knee: Principal | ICD-10-CM

## 2016-04-03 NOTE — Telephone Encounter (Signed)
Requesting refill on tramadol. She has enough to last through this week

## 2016-04-05 MED ORDER — TRAMADOL HCL 50 MG PO TABS: 50.0000 mg | ORAL_TABLET | Freq: Two times a day (BID) | ORAL | 0 refills | Status: DC

## 2016-04-05 NOTE — Telephone Encounter (Signed)
Routed to Dr. Manuella Ghazi for approval Requesting refill on tramadol. She has enough to last through this week

## 2016-04-05 NOTE — Telephone Encounter (Signed)
Prescription for tramadol is ready for pickup 

## 2016-04-12 ENCOUNTER — Encounter: Payer: Self-pay | Admitting: Family Medicine

## 2016-04-12 ENCOUNTER — Ambulatory Visit: Payer: Medicare Other | Admitting: Family Medicine

## 2016-04-12 ENCOUNTER — Ambulatory Visit (INDEPENDENT_AMBULATORY_CARE_PROVIDER_SITE_OTHER): Payer: Medicare Other | Admitting: Family Medicine

## 2016-04-12 VITALS — BP 132/67 | HR 70 | Temp 97.9°F | Resp 16 | Ht 66.0 in | Wt 167.3 lb

## 2016-04-12 DIAGNOSIS — J0101 Acute recurrent maxillary sinusitis: Secondary | ICD-10-CM | POA: Diagnosis not present

## 2016-04-12 MED ORDER — AMOXICILLIN-POT CLAVULANATE 875-125 MG PO TABS: 1.0000 | ORAL_TABLET | Freq: Two times a day (BID) | ORAL | 0 refills | Status: DC

## 2016-04-12 NOTE — Progress Notes (Signed)
Name: Jodi Bullock   MRN: [id number]    DOB: [birth year]   Date:04/12/2016       Progress Note  Subjective  Chief Complaint  Chief Complaint  Patient presents with  . Sinus Problem    Sinus Problem  This is a new problem. The current episode started in the past 7 days (flared up in last several weeks). There has been no fever. Associated symptoms include congestion, coughing, headaches and sinus pressure. Pertinent negatives include no sore throat. Past treatments include nothing.     Past Medical History:  Diagnosis Date  . Arthritis   . Breast cancer ([place])   . Cancer ([place]) 1998   breast  . Depression   . GERD (gastroesophageal reflux disease)   . History of kidney stones   . HLD (hyperlipidemia)   . Hypertension   . Hypertension   . Hypoplasia, thymus gland    cancer  . Neuropathy of both feet   . Systolic CHF with reduced left ventricular function, NYHA class 3 ([place]) 01/10/2016    Past Surgical History:  Procedure Laterality Date  . ABDOMINAL HYSTERECTOMY    . BREAST LUMPECTOMY    . CATARACT EXTRACTION    . CHOLECYSTECTOMY    . JOINT REPLACEMENT Right    Hip  . KNEE SURGERY Right     Family History  Problem Relation Age of Onset  . Stroke Mother   . Diabetes Father   . Heart disease Father   . Kidney disease Neg Hx   . Bladder Cancer Neg Hx     Social History   Social History  . Marital status: Married    Spouse name: N/A  . Number of children: N/A  . Years of education: N/A   Occupational History  . Not on file.   Social History Main Topics  . Smoking status: Former Research scientist (life sciences)  . Smokeless tobacco: Never Used     Comment: smoked in high school for a few years only  . Alcohol use No  . Drug use: No  . Sexual activity: Not Currently   Other Topics Concern  . Not on file   Social History Narrative  . No narrative on file     Current Outpatient Prescriptions:  .  Cholecalciferol 2000 UNITS TABS, Take 1 tablet by mouth daily., Disp: , Rfl:   .  fluticasone (FLONASE) 50 MCG/ACT nasal spray, Place 2 sprays into both nostrils at bedtime., Disp: , Rfl:  .  furosemide (LASIX) 20 MG tablet, Take 1 tablet (20 mg total) by mouth daily., Disp: 90 tablet, Rfl: 3 .  LORazepam (ATIVAN) 0.5 MG tablet, Take 1 tablet (0.5 mg total) by mouth 2 (two) times daily as needed for anxiety., Disp: 60 tablet, Rfl: 2 .  losartan (COZAAR) 100 MG tablet, Take 1 tablet (100 mg total) by mouth daily., Disp: 90 tablet, Rfl: 0 .  lovastatin (MEVACOR) 40 MG tablet, Take 1 tablet (40 mg total) by mouth daily., Disp: 90 tablet, Rfl: 0 .  metoprolol (LOPRESSOR) 50 MG tablet, TAKE 1 TABLET(50 MG) BY MOUTH TWICE DAILY, Disp: 180 tablet, Rfl: 1 .  Multiple Vitamins-Minerals (PRESERVISION/LUTEIN) CAPS, Take 1 each by mouth 2 (two) times daily., Disp: , Rfl:  .  Polyethylene Glycol 1450 LIQD, POLYETHYLENE GLYCOL (Powder) - Historical Medication  17 grams daily Active, Disp: , Rfl:  .  traMADol (ULTRAM) 50 MG tablet, Take 1 tablet (50 mg total) by mouth 2 (two) times daily., Disp: 60 tablet, Rfl: 0  Allergies  Allergen Reactions  . 2,4-D Dimethylamine (Amisol) Other (See Comments)    Other Reaction: OTHER REACTION-IRRITATION TO Esophagous  . Celebrex [Celecoxib] Other (See Comments)     esophagitis Esophageal spasms    . Ciprofloxacin Other (See Comments)    Esophageal irritation  . Ibuprofen     Other reaction(s): Other (See Comments) reflux  . Metronidazole Other (See Comments)    neuropathy  . Naproxen Other (See Comments)    GI UPSET  . Other Other (See Comments)    Other Reaction: esophagitis     Review of Systems  HENT: Positive for congestion and sinus pressure. Negative for sore throat.   Respiratory: Positive for cough.   Neurological: Positive for headaches.      Objective  Vitals:   04/12/16 1316  BP: 132/67  Pulse: 70  Resp: 16  Temp: 97.9 F (36.6 C)  TempSrc: Oral  SpO2: 96%  Weight: 167 lb 4.8 oz (75.9 kg)  Height: 5\' 6"   (1.676 m)    Physical Exam  Constitutional: She is well-developed, well-nourished, and in no distress.  HENT:  Nose: Right sinus exhibits maxillary sinus tenderness. Left sinus exhibits maxillary sinus tenderness.  Mouth/Throat: Posterior oropharyngeal erythema present. No oropharyngeal exudate or posterior oropharyngeal edema.  Cardiovascular: Normal rate, regular rhythm and normal heart sounds.   No murmur heard. Pulmonary/Chest: Effort normal and breath sounds normal. She has no wheezes.  Nursing note and vitals reviewed.    Assessment & Plan  1. Acute recurrent maxillary sinusitis  - amoxicillin-clavulanate (AUGMENTIN) 875-125 MG tablet; Take 1 tablet by mouth 2 (two) times daily.  Dispense: 20 tablet; Refill: 0   Jodi Bullock [facility] 04/12/2016 1:23 PM

## 2016-04-14 ENCOUNTER — Telehealth: Payer: Self-pay | Admitting: Family Medicine

## 2016-04-14 NOTE — Telephone Encounter (Signed)
Patient was prescribed something for sinus infection and it has caused her to have diarrhea. Please advise cause she has only taken 3 pills

## 2016-04-17 NOTE — Telephone Encounter (Signed)
If the diarrhea is not severe and patient is able to replenish her fluid losses by increasing fluid intake and by mouth intake, she may continue on and finish a course of antibiotics. If diarrhea persists, she should return to get tested for C. difficile infection.

## 2016-04-18 NOTE — Telephone Encounter (Signed)
Not able to reach pt, lvm for patient to return call

## 2016-05-01 ENCOUNTER — Ambulatory Visit (INDEPENDENT_AMBULATORY_CARE_PROVIDER_SITE_OTHER): Payer: Medicare Other | Admitting: Cardiovascular Disease

## 2016-05-01 ENCOUNTER — Encounter: Payer: Self-pay | Admitting: Cardiovascular Disease

## 2016-05-01 VITALS — BP 158/70 | HR 65 | Ht 66.0 in | Wt 170.8 lb

## 2016-05-01 DIAGNOSIS — I493 Ventricular premature depolarization: Secondary | ICD-10-CM | POA: Diagnosis not present

## 2016-05-01 DIAGNOSIS — I5022 Chronic systolic (congestive) heart failure: Secondary | ICD-10-CM | POA: Diagnosis not present

## 2016-05-01 NOTE — Progress Notes (Signed)
Cardiology Office Note   Date:  05/01/2016   ID:  Shifa Keady, DOB May 15, 1925, MRN [id number]  PCP:  Keith Rake, MD  Cardiologist:   Kathlyn Sacramento, MD   Chief Complaint  Patient presents with  . other    4 month f/u c/o arthritis and leg pain. Meds reveiwed verbally with pt.      History of Present Illness: Jodi Bullock is a 82 y.o. female who is here today for a follow-up visit regarding Chronic systolic heart failure thought to be due to nonischemic cardiomyopathy with no previous cardiac catheterization given age and improvement in symptoms with medical therapy. She has multiple chronic medical conditions that include hypertension, hyperlipidemia, premature beats, thymus cancer status post resection and esophageal spasm. She was evaluated in April 2017 for exertional dyspnea. A pharmacologic nuclear stress test showed no evidence of perfusion defects. Ejection fraction was calculated to be 39% . Echocardiogram showed mildly reduced LV systolic function with an ejection fraction of 40-45% with possible inferior wall hypokinesis, elevated filling pressures and mild to moderate pulmonary hypertension with systolic pulmonary pressure 46 mmHg.  she was treated with small dose furosemide with subsequent improvement in symptoms.   She has been doing reasonably well. Her blood pressure is elevated today but she reports being upset about an incident at [facility] [place] residence. She gradually gained about 4 pounds over the last few months but she usually skips furosemide on Sunday in order to go to church. She denies any chest pain or worsening dyspnea.  Past Medical History:  Diagnosis Date  . Arthritis   . Breast cancer ([place])   . Cancer ([place]) 1998   breast  . Depression   . GERD (gastroesophageal reflux disease)   . History of kidney stones   . HLD (hyperlipidemia)   . Hypertension   . Hypertension   . Hypoplasia, thymus gland    cancer  . Neuropathy of both feet   . Systolic  CHF with reduced left ventricular function, NYHA class 3 ([place]) 01/10/2016    Past Surgical History:  Procedure Laterality Date  . ABDOMINAL HYSTERECTOMY    . BREAST LUMPECTOMY    . CATARACT EXTRACTION    . CHOLECYSTECTOMY    . JOINT REPLACEMENT Right    Hip  . KNEE SURGERY Right      Current Outpatient Prescriptions  Medication Sig Dispense Refill  . Cholecalciferol 2000 UNITS TABS Take 1 tablet by mouth daily.    . fluticasone (FLONASE) 50 MCG/ACT nasal spray Place 2 sprays into both nostrils at bedtime.    . furosemide (LASIX) 20 MG tablet Take 1 tablet (20 mg total) by mouth daily. 90 tablet 3  . LORazepam (ATIVAN) 0.5 MG tablet Take 1 tablet (0.5 mg total) by mouth 2 (two) times daily as needed for anxiety. 60 tablet 2  . losartan (COZAAR) 100 MG tablet Take 1 tablet (100 mg total) by mouth daily. 90 tablet 0  . lovastatin (MEVACOR) 40 MG tablet Take 1 tablet (40 mg total) by mouth daily. 90 tablet 0  . metoprolol (LOPRESSOR) 50 MG tablet TAKE 1 TABLET(50 MG) BY MOUTH TWICE DAILY 180 tablet 1  . Multiple Vitamins-Minerals (PRESERVISION/LUTEIN) CAPS Take 1 each by mouth 2 (two) times daily.    . Polyethylene Glycol 1450 LIQD POLYETHYLENE GLYCOL (Powder) - Historical Medication  17 grams daily Active    . traMADol (ULTRAM) 50 MG tablet Take 1 tablet (50 mg total) by mouth 2 (two) times daily. 60 tablet 0  No current facility-administered medications for this visit.     Allergies:   2,4-d dimethylamine (amisol); Celebrex [celecoxib]; Ciprofloxacin; Ibuprofen; Metronidazole; Naproxen; and Other    Social History:  The patient  reports that she has quit smoking. She has never used smokeless tobacco. She reports that she does not drink alcohol or use drugs.   Family History:  The patient's family history includes Diabetes in her father; Heart disease in her father; Stroke in her mother.    ROS:  Please see the history of present illness.   Otherwise, review of systems are  positive for none.   All other systems are reviewed and negative.    PHYSICAL EXAM: VS:  BP (!) 158/70 (BP Location: Left Arm, Patient Position: Sitting, Cuff Size: Normal)   Pulse 65   Ht 5\' 6"  (1.676 m)   Wt 170 lb 12 oz (77.5 kg)   BMI 27.56 kg/m  , BMI Body mass index is 27.56 kg/m. GEN: Well nourished, well developed, in no acute distress HEENT: normal Neck: no JVD, carotid bruits, or masses Cardiac: RRR; no murmurs, rubs, or gallops, mild bilateral edema  Respiratory:  clear to auscultation bilaterally, normal work of breathing GI: soft, nontender, nondistended, + BS MS: no deformity or atrophy Skin: warm and dry, no rash Neuro:  Strength and sensation are intact Psych: euthymic mood, full affect   EKG:  EKG is ordered today. EKG showed sinus rhythm with first degree AV block and poor R-wave progression in the anterior leads.   Recent Labs: 08/31/2015: TSH 1.050 01/23/2016: ALT 16; BUN 21; Creatinine, Ser 0.82; Hemoglobin 14.0; Platelets 236; Potassium 4.2; Sodium 141    Lipid Panel    Component Value Date/Time   CHOL 132 02/17/2016 0822   CHOL 131 06/04/2015 0810   TRIG 126 02/17/2016 0822   HDL 51 02/17/2016 0822   HDL 54 06/04/2015 0810   CHOLHDL 2.6 02/17/2016 0822   VLDL 25 02/17/2016 0822   LDLCALC 56 02/17/2016 0822   LDLCALC 53 06/04/2015 0810      Wt Readings from Last 3 Encounters:  05/01/16 170 lb 12 oz (77.5 kg)  04/12/16 167 lb 4.8 oz (75.9 kg)  02/14/16 166 lb 1.6 oz (75.3 kg)        ASSESSMENT AND PLAN:  1.  Chronic systolic heart failure: Ejection fraction was 40-45% with mild to moderate pulmonary hypertension. She seems to be stable overall. She might be mildly volume overloaded but she did not take furosemide yesterday. Continue same dose.  Continue treatment with metoprolol and losartan.  2. PVCs: No evidence of premature beats on physical exam. Continue metoprolol.    Disposition:   FU with me in 6 months  Signed,  Kathlyn Sacramento, MD  05/01/2016 2:18 PM    [facility]

## 2016-05-01 NOTE — Patient Instructions (Signed)
Medication Instructions: Continue same medications.   Labwork: None.   Procedures/Testing: None.   Follow-Up: 6 months with Dr. Alan Riles.   Any Additional Special Instructions Will Be Listed Below (If Applicable).     If you need a refill on your cardiac medications before your next appointment, please call your pharmacy.   

## 2016-05-04 ENCOUNTER — Telehealth: Payer: Self-pay | Admitting: Family Medicine

## 2016-05-04 NOTE — Telephone Encounter (Signed)
errenous °

## 2016-05-10 ENCOUNTER — Telehealth: Payer: Self-pay | Admitting: Family Medicine

## 2016-05-10 DIAGNOSIS — F419 Anxiety disorder, unspecified: Secondary | ICD-10-CM

## 2016-05-10 DIAGNOSIS — M25562 Pain in left knee: Secondary | ICD-10-CM

## 2016-05-10 DIAGNOSIS — M25561 Pain in right knee: Secondary | ICD-10-CM

## 2016-05-10 DIAGNOSIS — G8929 Other chronic pain: Secondary | ICD-10-CM

## 2016-05-10 NOTE — Telephone Encounter (Signed)
Pt needs refill on Tramodol and Lorazepam.

## 2016-05-12 MED ORDER — TRAMADOL HCL 50 MG PO TABS: 50.0000 mg | ORAL_TABLET | Freq: Two times a day (BID) | ORAL | 0 refills | Status: DC

## 2016-05-12 MED ORDER — LORAZEPAM 0.5 MG PO TABS: 0.5000 mg | ORAL_TABLET | Freq: Two times a day (BID) | ORAL | 2 refills | Status: DC | PRN

## 2016-05-12 NOTE — Telephone Encounter (Signed)
Routed to Dr. Shah for approval 

## 2016-05-12 NOTE — Telephone Encounter (Signed)
Prescription for tramadol and lorazepam is printed and ready for pickup

## 2016-05-16 NOTE — Telephone Encounter (Signed)
Pt informed

## 2016-05-17 ENCOUNTER — Ambulatory Visit (INDEPENDENT_AMBULATORY_CARE_PROVIDER_SITE_OTHER): Payer: Medicare Other | Admitting: Family Medicine

## 2016-05-17 ENCOUNTER — Encounter: Payer: Self-pay | Admitting: Family Medicine

## 2016-05-17 VITALS — BP 134/64 | HR 66 | Temp 97.8°F | Resp 16 | Ht 66.0 in | Wt 170.2 lb

## 2016-05-17 DIAGNOSIS — Z0000 Encounter for general adult medical examination without abnormal findings: Secondary | ICD-10-CM | POA: Insufficient documentation

## 2016-05-17 DIAGNOSIS — E785 Hyperlipidemia, unspecified: Secondary | ICD-10-CM

## 2016-05-17 LAB — COMPLETE METABOLIC PANEL WITH GFR
ALT: 10 U/L (ref 6–29)
AST: 15 U/L (ref 10–35)
Albumin: 4 g/dL (ref 3.6–5.1)
Alkaline Phosphatase: 78 U/L (ref 33–130)
BILIRUBIN TOTAL: 0.6 mg/dL (ref 0.2–1.2)
BUN: 15 mg/dL (ref 7–25)
CO2: 30 mmol/L (ref 20–31)
CREATININE: 0.76 mg/dL (ref 0.60–0.88)
Calcium: 9.2 mg/dL (ref 8.6–10.4)
Chloride: 105 mmol/L (ref 98–110)
GFR, Est African American: 80 mL/min (ref >=60)
GFR, Est Non African American: 70 mL/min (ref >=60)
GLUCOSE: 104 mg/dL — AB (ref 65–99)
Potassium: 5.3 mmol/L (ref 3.5–5.3)
SODIUM: 142 mmol/L (ref 135–146)
TOTAL PROTEIN: 6.2 g/dL (ref 6.1–8.1)

## 2016-05-17 LAB — LIPID PANEL
CHOL/HDL RATIO: 3 ratio (ref <5.0)
CHOLESTEROL: 164 mg/dL (ref <200)
HDL: 54 mg/dL (ref >50)
LDL CALC: 89 mg/dL (ref <100)
Triglycerides: 104 mg/dL (ref <150)
VLDL: 21 mg/dL (ref <30)

## 2016-05-17 MED ORDER — LOVASTATIN 40 MG PO TABS: 40.0000 mg | ORAL_TABLET | Freq: Every day | ORAL | 0 refills | Status: DC

## 2016-05-17 NOTE — Progress Notes (Signed)
Name: Jodi Bullock   MRN: [id number]    DOB: 05/19/1924   Date:05/17/2016       Progress Note  Subjective  Chief Complaint  Chief Complaint  Patient presents with  . Medicare Wellness    HPI  Pt. Presents for [employer] Visit.   Subjective:   Jodi Bullock is a 83 y.o. female who presents for Medicare Annual (Subsequent) preventive examination.  Review of Systems:         Objective:     Vitals: BP 134/64 (BP Location: Right Arm, Patient Position: Sitting, Cuff Size: Normal)   Pulse 66   Temp 97.8 F (36.6 C) (Oral)   Resp 16   Ht 5\' 6"  (1.676 m)   Wt 170 lb 3.2 oz (77.2 kg)   SpO2 93%   BMI 27.47 kg/m   Body mass index is 27.47 kg/m.   Tobacco History  Smoking Status  . Former Smoker  Smokeless Tobacco  . Never Used    Comment: smoked in high school for a few years only     Counseling given: Not Answered   Past Medical History:  Diagnosis Date  . Arthritis   . Breast cancer ([place])   . Cancer ([place]) 1998   breast  . Depression   . GERD (gastroesophageal reflux disease)   . History of kidney stones   . HLD (hyperlipidemia)   . Hypertension   . Hypertension   . Hypoplasia, thymus gland    cancer  . Neuropathy of both feet   . Systolic CHF with reduced left ventricular function, NYHA class 3 ([place]) 01/10/2016   Past Surgical History:  Procedure Laterality Date  . ABDOMINAL HYSTERECTOMY    . BREAST LUMPECTOMY    . CATARACT EXTRACTION    . CHOLECYSTECTOMY    . JOINT REPLACEMENT Right    Hip  . KNEE SURGERY Right    Family History  Problem Relation Age of Onset  . Stroke Mother   . Diabetes Father   . Heart disease Father   . Kidney disease Neg Hx   . Bladder Cancer Neg Hx    History  Sexual Activity  . Sexual activity: Not Currently    Outpatient Encounter Prescriptions as of 05/17/2016  Medication Sig  . Cholecalciferol 2000 UNITS TABS Take 1 tablet by mouth daily.  . fluticasone (FLONASE) 50 MCG/ACT nasal spray Place 2 sprays  into both nostrils at bedtime.  . furosemide (LASIX) 20 MG tablet Take 1 tablet (20 mg total) by mouth daily.  Marland Kitchen LORazepam (ATIVAN) 0.5 MG tablet Take 1 tablet (0.5 mg total) by mouth 2 (two) times daily as needed for anxiety.  Marland Kitchen losartan (COZAAR) 100 MG tablet Take 1 tablet (100 mg total) by mouth daily.  Marland Kitchen lovastatin (MEVACOR) 40 MG tablet Take 1 tablet (40 mg total) by mouth daily.  . metoprolol (LOPRESSOR) 50 MG tablet TAKE 1 TABLET(50 MG) BY MOUTH TWICE DAILY  . Multiple Vitamins-Minerals (PRESERVISION/LUTEIN) CAPS Take 1 each by mouth 2 (two) times daily.  . Polyethylene Glycol 1450 LIQD POLYETHYLENE GLYCOL (Powder) - Historical Medication  17 grams daily Active  . traMADol (ULTRAM) 50 MG tablet Take 1 tablet (50 mg total) by mouth 2 (two) times daily.   No facility-administered encounter medications on file as of 05/17/2016.     Activities of Daily Living In your present state of health, do you have any difficulty performing the following activities: 05/17/2016 04/12/2016  Hearing? Tempie Donning  Vision? Y Y  Difficulty concentrating or  making decisions? N N  Walking or climbing stairs? N N  Dressing or bathing? N N  Doing errands, shopping? N N  Some recent data might be hidden  Hearing aid on left side Vision is impaired due to macular degeneration She can walk with assistance, sometimes difficult to get up from the chair and start walking.    Patient Care Team: Roselee Nova, MD as PCP - General (Family Medicine) Wellington Hampshire, MD as Consulting Physician (Cardiology)  Dr. Murvin Natal (Ophthalmologist) Dr. Carlye Grippe (Oncologist at [facility])      Assessment:     Exercise Activities and Dietary recommendations   Walks with a cane, no formal routine. Advised against doing aerobics by Cardiologist. Dietary regimen is appropriate, cooks some food on her own, most meals at the cafeteria at Assisted living.  Goals    None     Fall Risk: Four separate incidents of falls in year  2017.   Fall Risk  05/17/2016 04/12/2016 01/26/2016 01/10/2016 11/19/2015  Falls in the past year? No No Yes No Yes  Number falls in past yr: - - 1 - 1  Injury with Fall? - - Yes - Yes  Risk Factor Category  - - - - -  Risk for fall due to : - - History of fall(s);Impaired balance/gait - -  Follow up - - Falls evaluation completed - Follow up appointment   Depression Screen [facility] 2/9 Scores 05/17/2016 04/12/2016 01/26/2016 01/10/2016  PHQ - 2 Score 0 0 0 0     Cognitive Function      Alert and oriented to self, place and time, normal mood and affect.   Immunization History  Administered Date(s) Administered  . Influenza-Unspecified 02/15/2016  . Tdap 04/20/2015   Screening Tests Health Maintenance  Topic Date Due  . ZOSTAVAX  04/06/1987  . DEXA SCAN  04/05/1992  . PNA vac Low Risk Adult (1 of 2 - PCV13) 04/05/1992  . TETANUS/TDAP  04/19/2025  . INFLUENZA VACCINE  Completed      Plan:   During the course of the visit the patient was educated and counseled about the following appropriate screening and preventive services:   Vaccines to include Pneumoccal, Influenza, Hepatitis B, Td, Zostavax. (Pt. Declined shingles vaccine, has reportedly received Pneumonia vaccine already)  Cardiovascular Disease  Patient Instructions (the written plan) was given to the patient.   Keith Rake, MD  05/17/2016      Past Medical History:  Diagnosis Date  . Arthritis   . Breast cancer ([place])   . Cancer ([place]) 1998   breast  . Depression   . GERD (gastroesophageal reflux disease)   . History of kidney stones   . HLD (hyperlipidemia)   . Hypertension   . Hypertension   . Hypoplasia, thymus gland    cancer  . Neuropathy of both feet   . Systolic CHF with reduced left ventricular function, NYHA class 3 ([place]) 01/10/2016    Past Surgical History:  Procedure Laterality Date  . ABDOMINAL HYSTERECTOMY    . BREAST LUMPECTOMY    . CATARACT EXTRACTION    . CHOLECYSTECTOMY    . JOINT  REPLACEMENT Right    Hip  . KNEE SURGERY Right     Family History  Problem Relation Age of Onset  . Stroke Mother   . Diabetes Father   . Heart disease Father   . Kidney disease Neg Hx   . Bladder Cancer Neg Hx     Social History  Social History  . Marital status: Married    Spouse name: N/A  . Number of children: N/A  . Years of education: N/A   Occupational History  . Not on file.   Social History Main Topics  . Smoking status: Former Research scientist (life sciences)  . Smokeless tobacco: Never Used     Comment: smoked in high school for a few years only  . Alcohol use No  . Drug use: No  . Sexual activity: Not Currently   Other Topics Concern  . Not on file   Social History Narrative  . No narrative on file     Current Outpatient Prescriptions:  .  Cholecalciferol 2000 UNITS TABS, Take 1 tablet by mouth daily., Disp: , Rfl:  .  fluticasone (FLONASE) 50 MCG/ACT nasal spray, Place 2 sprays into both nostrils at bedtime., Disp: , Rfl:  .  furosemide (LASIX) 20 MG tablet, Take 1 tablet (20 mg total) by mouth daily., Disp: 90 tablet, Rfl: 3 .  LORazepam (ATIVAN) 0.5 MG tablet, Take 1 tablet (0.5 mg total) by mouth 2 (two) times daily as needed for anxiety., Disp: 60 tablet, Rfl: 2 .  losartan (COZAAR) 100 MG tablet, Take 1 tablet (100 mg total) by mouth daily., Disp: 90 tablet, Rfl: 0 .  lovastatin (MEVACOR) 40 MG tablet, Take 1 tablet (40 mg total) by mouth daily., Disp: 90 tablet, Rfl: 0 .  metoprolol (LOPRESSOR) 50 MG tablet, TAKE 1 TABLET(50 MG) BY MOUTH TWICE DAILY, Disp: 180 tablet, Rfl: 1 .  Multiple Vitamins-Minerals (PRESERVISION/LUTEIN) CAPS, Take 1 each by mouth 2 (two) times daily., Disp: , Rfl:  .  Polyethylene Glycol 1450 LIQD, POLYETHYLENE GLYCOL (Powder) - Historical Medication  17 grams daily Active, Disp: , Rfl:  .  traMADol (ULTRAM) 50 MG tablet, Take 1 tablet (50 mg total) by mouth 2 (two) times daily., Disp: 60 tablet, Rfl: 0  Allergies  Allergen Reactions  . 2,4-D  Dimethylamine (Amisol) Other (See Comments)    Other Reaction: OTHER REACTION-IRRITATION TO Esophagous  . Celebrex [Celecoxib] Other (See Comments)     esophagitis Esophageal spasms    . Ciprofloxacin Other (See Comments)    Esophageal irritation  . Ibuprofen     Other reaction(s): Other (See Comments) reflux  . Metronidazole Other (See Comments)    neuropathy  . Naproxen Other (See Comments)    GI UPSET  . Other Other (See Comments)    Other Reaction: esophagitis     Review of Systems  Constitutional: Positive for malaise/fatigue. Negative for chills, fever and weight loss.  HENT: Positive for congestion and sinus pain. Negative for hearing loss.   Eyes: Negative for blurred vision and double vision.  Respiratory: Positive for sputum production and shortness of breath. Negative for cough.   Cardiovascular: Negative for chest pain, palpitations and leg swelling.  Gastrointestinal: Positive for constipation (occasional constipation). Negative for abdominal pain, blood in stool, diarrhea, nausea and vomiting.  Genitourinary: Negative for dysuria and hematuria.  Musculoskeletal: Positive for joint pain and neck pain. Negative for back pain.  Neurological: Negative for dizziness and headaches.  Psychiatric/Behavioral: Negative for depression. The patient is nervous/anxious.       Objective  Vitals:   05/17/16 0909  BP: 134/64  Pulse: 66  Resp: 16  Temp: 97.8 F (36.6 C)  TempSrc: Oral  SpO2: 93%  Weight: 170 lb 3.2 oz (77.2 kg)  Height: 5\' 6"  (1.676 m)    Physical Exam  Constitutional: She is oriented to person, place, and time  and well-developed, well-nourished, and in no distress.  HENT:  Head: Normocephalic and atraumatic.  Right Ear: Tympanic membrane and ear canal normal.  Left Ear: Tympanic membrane and ear canal normal.  Mouth/Throat: No posterior oropharyngeal edema or posterior oropharyngeal erythema.  Cardiovascular: Normal rate, regular rhythm and normal  heart sounds.   No murmur heard. Pulmonary/Chest: Effort normal and breath sounds normal. She has no wheezes.  Abdominal: Soft. Bowel sounds are normal. There is tenderness.  Neurological: She is alert and oriented to person, place, and time.  Psychiatric: Mood, memory, affect and judgment normal.  Nursing note and vitals reviewed.     Assessment & Plan  1. Medicare annual wellness visit, subsequent Pt. Refused Shingles vaccine.   2. Dyslipidemia  - lovastatin (MEVACOR) 40 MG tablet; Take 1 tablet (40 mg total) by mouth daily.  Dispense: 90 tablet; Refill: 0 - Lipid Profile - COMPLETE METABOLIC PANEL WITH GFR   Purvis Sidle Asad A. [facility] 05/17/2016 9:17 AM

## 2016-06-05 ENCOUNTER — Telehealth: Payer: Self-pay | Admitting: Family Medicine

## 2016-06-05 NOTE — Telephone Encounter (Signed)
Pt needs refill on Losartan, and Tramoldol.

## 2016-06-06 ENCOUNTER — Other Ambulatory Visit: Payer: Self-pay | Admitting: Emergency Medicine

## 2016-06-06 DIAGNOSIS — G8929 Other chronic pain: Secondary | ICD-10-CM

## 2016-06-06 DIAGNOSIS — M25562 Pain in left knee: Principal | ICD-10-CM

## 2016-06-06 DIAGNOSIS — M25561 Pain in right knee: Principal | ICD-10-CM

## 2016-06-06 MED ORDER — TRAMADOL HCL 50 MG PO TABS: 50.0000 mg | ORAL_TABLET | Freq: Two times a day (BID) | ORAL | 0 refills | Status: DC

## 2016-06-06 NOTE — Telephone Encounter (Signed)
Script ready for pick up 

## 2016-06-06 NOTE — Telephone Encounter (Signed)
Pt informed prescription is ready for pick up.

## 2016-06-09 ENCOUNTER — Other Ambulatory Visit: Payer: Self-pay | Admitting: Family Medicine

## 2016-06-13 ENCOUNTER — Ambulatory Visit: Payer: Medicare Other | Admitting: Family Medicine

## 2016-06-14 ENCOUNTER — Ambulatory Visit (INDEPENDENT_AMBULATORY_CARE_PROVIDER_SITE_OTHER): Payer: Medicare Other | Admitting: Family Medicine

## 2016-06-14 ENCOUNTER — Encounter: Payer: Self-pay | Admitting: Family Medicine

## 2016-06-14 DIAGNOSIS — R058 Other specified cough: Secondary | ICD-10-CM

## 2016-06-14 DIAGNOSIS — R05 Cough: Secondary | ICD-10-CM

## 2016-06-14 MED ORDER — BENZONATATE 100 MG PO CAPS: 100.0000 mg | ORAL_CAPSULE | Freq: Two times a day (BID) | ORAL | 0 refills | Status: AC | PRN

## 2016-06-14 NOTE — Progress Notes (Signed)
Name: Jodi Bullock   MRN: [id number]    DOB: 07/16/1924   Date:06/14/2016       Progress Note  Subjective  Chief Complaint  Chief Complaint  Patient presents with  . Cough    with production    HPI   Productive Cough: Pt. Presents for evaluation of a worsening cough with chest congestion, she has chronic intermittent cough but has gotten worse since yesterday with chest tightness, some yelowish mucus production (sometimes its clear). She wanted to make sure that 'I am not coming down with something). She lives in [place] and reports no sick contacts.  She denies any fevers, chills, dyspnea, chest pain, unusual fatigue.    Past Medical History:  Diagnosis Date  . Arthritis   . Breast cancer ([place])   . Cancer ([place]) 1998   breast  . Depression   . GERD (gastroesophageal reflux disease)   . History of kidney stones   . HLD (hyperlipidemia)   . Hypertension   . Hypertension   . Hypoplasia, thymus gland    cancer  . Neuropathy of both feet   . Systolic CHF with reduced left ventricular function, NYHA class 3 ([place]) 01/10/2016    Past Surgical History:  Procedure Laterality Date  . ABDOMINAL HYSTERECTOMY    . BREAST LUMPECTOMY    . CATARACT EXTRACTION    . CHOLECYSTECTOMY    . JOINT REPLACEMENT Right    Hip  . KNEE SURGERY Right     Family History  Problem Relation Age of Onset  . Stroke Mother   . Diabetes Father   . Heart disease Father   . Kidney disease Neg Hx   . Bladder Cancer Neg Hx     Social History   Social History  . Marital status: Married    Spouse name: N/A  . Number of children: N/A  . Years of education: N/A   Occupational History  . Not on file.   Social History Main Topics  . Smoking status: Former Research scientist (life sciences)  . Smokeless tobacco: Never Used     Comment: smoked in high school for a few years only  . Alcohol use No  . Drug use: No  . Sexual activity: Not Currently   Other Topics Concern  . Not on file   Social History  Narrative  . No narrative on file     Current Outpatient Prescriptions:  .  Cholecalciferol 2000 UNITS TABS, Take 1 tablet by mouth daily., Disp: , Rfl:  .  fluticasone (FLONASE) 50 MCG/ACT nasal spray, Place 2 sprays into both nostrils at bedtime., Disp: , Rfl:  .  furosemide (LASIX) 20 MG tablet, Take 1 tablet (20 mg total) by mouth daily., Disp: 90 tablet, Rfl: 3 .  LORazepam (ATIVAN) 0.5 MG tablet, Take 1 tablet (0.5 mg total) by mouth 2 (two) times daily as needed for anxiety., Disp: 60 tablet, Rfl: 2 .  losartan (COZAAR) 100 MG tablet, TAKE 1 TABLET(100 MG) BY MOUTH DAILY, Disp: 90 tablet, Rfl: 0 .  lovastatin (MEVACOR) 40 MG tablet, Take 1 tablet (40 mg total) by mouth daily., Disp: 90 tablet, Rfl: 0 .  metoprolol (LOPRESSOR) 50 MG tablet, TAKE 1 TABLET(50 MG) BY MOUTH TWICE DAILY, Disp: 180 tablet, Rfl: 1 .  Multiple Vitamins-Minerals (PRESERVISION/LUTEIN) CAPS, Take 1 each by mouth 2 (two) times daily., Disp: , Rfl:  .  Polyethylene Glycol 1450 LIQD, POLYETHYLENE GLYCOL (Powder) - Historical Medication  17 grams daily Active, Disp: , Rfl:  .  traMADol (ULTRAM) 50 MG tablet, Take 1 tablet (50 mg total) by mouth 2 (two) times daily., Disp: 60 tablet, Rfl: 0  Allergies  Allergen Reactions  . 2,4-D Dimethylamine (Amisol) Other (See Comments)    Other Reaction: OTHER REACTION-IRRITATION TO Esophagous  . Celebrex [Celecoxib] Other (See Comments)     esophagitis Esophageal spasms    . Ciprofloxacin Other (See Comments)    Esophageal irritation  . Ibuprofen     Other reaction(s): Other (See Comments) reflux  . Metronidazole Other (See Comments)    neuropathy  . Naproxen Other (See Comments)    GI UPSET  . Other Other (See Comments)    Other Reaction: esophagitis     ROS  Please see history of present illness for complete discussion of ROS  Objective  Vitals:   06/14/16 1508  BP: 135/72  Pulse: 78  Resp: 16  Temp: 99.2 F (37.3 C)  TempSrc: Oral  SpO2: 98%   Weight: 168 lb 1.6 oz (76.2 kg)  Height: 5\' 6"  (1.676 m)    Physical Exam  Constitutional: She is well-developed, well-nourished, and in no distress.  HENT:  Head: Normocephalic and atraumatic.  Right Ear: Tympanic membrane and ear canal normal.  Left Ear: Tympanic membrane and ear canal normal.  Nose: Right sinus exhibits no maxillary sinus tenderness. Left sinus exhibits no maxillary sinus tenderness.  Mouth/Throat: Posterior oropharyngeal erythema present.  Cardiovascular: Normal rate, regular rhythm, S1 normal, S2 normal and normal heart sounds.   No murmur heard. Pulmonary/Chest: Effort normal and breath sounds normal. She has no wheezes.  Nursing note and vitals reviewed.      Assessment & Plan  1. Recurrent productive cough Most likely due to nasal and sinus congestion, exam not consistent with pneumonia, bronchitis, or other acute respiratory illness. Started on benzonatate for treatment - benzonatate (TESSALON) 100 MG capsule; Take 1 capsule (100 mg total) by mouth 2 (two) times daily as needed for cough.  Dispense: 14 capsule; Refill: 0   Mckennon Zwart Asad A. [place] [facility] 06/14/2016 3:45 PM

## 2016-06-19 ENCOUNTER — Ambulatory Visit
Admission: RE | Admit: 2016-06-19 | Discharge: 2016-06-19 | Disposition: A | Discharge status: Home / Self Care | Payer: Medicare Other | Source: Ambulatory Visit | Attending: Family Medicine | Admitting: Family Medicine

## 2016-06-19 ENCOUNTER — Encounter: Payer: Self-pay | Admitting: Family Medicine

## 2016-06-19 ENCOUNTER — Ambulatory Visit (INDEPENDENT_AMBULATORY_CARE_PROVIDER_SITE_OTHER): Payer: Medicare Other | Admitting: Family Medicine

## 2016-06-19 DIAGNOSIS — J209 Acute bronchitis, unspecified: Secondary | ICD-10-CM | POA: Insufficient documentation

## 2016-06-19 DIAGNOSIS — I700 Atherosclerosis of aorta: Secondary | ICD-10-CM | POA: Diagnosis not present

## 2016-06-19 DIAGNOSIS — R062 Wheezing: Secondary | ICD-10-CM | POA: Diagnosis present

## 2016-06-19 DIAGNOSIS — R05 Cough: Secondary | ICD-10-CM | POA: Diagnosis present

## 2016-06-19 MED ORDER — AZITHROMYCIN 250 MG PO TABS: ORAL_TABLET | ORAL | 0 refills | Status: DC

## 2016-06-19 MED ORDER — ALBUTEROL SULFATE HFA 108 (90 BASE) MCG/ACT IN AERS: 2.0000 | INHALATION_SPRAY | Freq: Four times a day (QID) | RESPIRATORY_TRACT | 1 refills | Status: DC | PRN

## 2016-06-19 MED ORDER — GUAIFENESIN-CODEINE 100-10 MG/5ML PO SYRP: 10.0000 mL | ORAL_SOLUTION | Freq: Three times a day (TID) | ORAL | 0 refills | Status: DC | PRN

## 2016-06-19 NOTE — Progress Notes (Signed)
Name: Jodi Bullock   MRN: [id number]    DOB: [birth year]   Date:06/19/2016       Progress Note  Subjective  Chief Complaint  Chief Complaint  Patient presents with  . Cough    for about a week not getting better    Cough  This is a recurrent problem. The current episode started in the past 7 days. The problem has been gradually worsening. The cough is productive of sputum. Associated symptoms include chest pain. Pertinent negatives include no fever (has been feeling hotter than usual), myalgias, sore throat or wheezing. She has tried prescription cough suppressant for the symptoms. The treatment provided mild relief. There is no history of bronchitis or COPD.     Past Medical History:  Diagnosis Date  . Arthritis   . Breast cancer ([place])   . Cancer ([place]) 1998   breast  . Depression   . GERD (gastroesophageal reflux disease)   . History of kidney stones   . HLD (hyperlipidemia)   . Hypertension   . Hypertension   . Hypoplasia, thymus gland    cancer  . Neuropathy of both feet   . Systolic CHF with reduced left ventricular function, NYHA class 3 ([place]) 01/10/2016    Past Surgical History:  Procedure Laterality Date  . ABDOMINAL HYSTERECTOMY    . BREAST LUMPECTOMY    . CATARACT EXTRACTION    . CHOLECYSTECTOMY    . JOINT REPLACEMENT Right    Hip  . KNEE SURGERY Right     Family History  Problem Relation Age of Onset  . Stroke Mother   . Diabetes Father   . Heart disease Father   . Kidney disease Neg Hx   . Bladder Cancer Neg Hx     Social History   Social History  . Marital status: Married    Spouse name: N/A  . Number of children: N/A  . Years of education: N/A   Occupational History  . Not on file.   Social History Main Topics  . Smoking status: Former Research scientist (life sciences)  . Smokeless tobacco: Never Used     Comment: smoked in high school for a few years only  . Alcohol use No  . Drug use: No  . Sexual activity: Not Currently   Other Topics Concern  . Not on  file   Social History Narrative  . No narrative on file     Current Outpatient Prescriptions:  .  benzonatate (TESSALON) 100 MG capsule, Take 1 capsule (100 mg total) by mouth 2 (two) times daily as needed for cough., Disp: 14 capsule, Rfl: 0 .  Cholecalciferol 2000 UNITS TABS, Take 1 tablet by mouth daily., Disp: , Rfl:  .  fluticasone (FLONASE) 50 MCG/ACT nasal spray, Place 2 sprays into both nostrils at bedtime., Disp: , Rfl:  .  furosemide (LASIX) 20 MG tablet, Take 1 tablet (20 mg total) by mouth daily., Disp: 90 tablet, Rfl: 3 .  LORazepam (ATIVAN) 0.5 MG tablet, Take 1 tablet (0.5 mg total) by mouth 2 (two) times daily as needed for anxiety., Disp: 60 tablet, Rfl: 2 .  losartan (COZAAR) 100 MG tablet, TAKE 1 TABLET(100 MG) BY MOUTH DAILY, Disp: 90 tablet, Rfl: 0 .  lovastatin (MEVACOR) 40 MG tablet, Take 1 tablet (40 mg total) by mouth daily., Disp: 90 tablet, Rfl: 0 .  metoprolol (LOPRESSOR) 50 MG tablet, TAKE 1 TABLET(50 MG) BY MOUTH TWICE DAILY, Disp: 180 tablet, Rfl: 1 .  Multiple Vitamins-Minerals (PRESERVISION/LUTEIN) CAPS, Take 1  each by mouth 2 (two) times daily., Disp: , Rfl:  .  Polyethylene Glycol 1450 LIQD, POLYETHYLENE GLYCOL (Powder) - Historical Medication  17 grams daily Active, Disp: , Rfl:  .  traMADol (ULTRAM) 50 MG tablet, Take 1 tablet (50 mg total) by mouth 2 (two) times daily., Disp: 60 tablet, Rfl: 0  Allergies  Allergen Reactions  . 2,4-D Dimethylamine (Amisol) Other (See Comments)    Other Reaction: OTHER REACTION-IRRITATION TO Esophagous  . Celebrex [Celecoxib] Other (See Comments)     esophagitis Esophageal spasms    . Ciprofloxacin Other (See Comments)    Esophageal irritation  . Ibuprofen     Other reaction(s): Other (See Comments) reflux  . Metronidazole Other (See Comments)    neuropathy  . Naproxen Other (See Comments)    GI UPSET  . Other Other (See Comments)    Other Reaction: esophagitis     Review of Systems  Constitutional:  Negative for fever (has been feeling hotter than usual).  HENT: Negative for sore throat.   Respiratory: Positive for cough. Negative for wheezing.   Cardiovascular: Positive for chest pain.  Musculoskeletal: Negative for myalgias.    Objective  Vitals:   06/19/16 1106  BP: 128/68  Pulse: 74  Resp: 16  Temp: 98.7 F (37.1 C)  SpO2: 93%  Weight: 168 lb 8 oz (76.4 kg)  Height: 5\' 6"  (1.676 m)    Physical Exam  Constitutional: She is oriented to person, place, and time and well-developed, well-nourished, and in no distress.  HENT:  Head: Normocephalic and atraumatic.  Mouth/Throat: Posterior oropharyngeal erythema present.  Cardiovascular: Normal rate, regular rhythm, S1 normal, S2 normal and normal heart sounds.   Pulmonary/Chest: No respiratory distress. She has wheezes in the right middle field, the right lower field, the left middle field and the left lower field. She has no rales.  Neurological: She is alert and oriented to person, place, and time.  Psychiatric: Mood, memory, affect and judgment normal.  Nursing note and vitals reviewed.    Assessment & Plan  1. Acute bronchitis, unspecified organism Productive cough with wheezing, not responsive to Tessalon. We'll start on antibiotic with Cheratussin, added albuterol for relief of bronchospasm. Obtain chest x-ray to rule out pneumonia - azithromycin (ZITHROMAX) 250 MG tablet; 2 tabs po day 1, then 1 tab po q day x 4 days  Dispense: 6 tablet; Refill: 0 - guaiFENesin-codeine (CHERATUSSIN AC) 100-10 MG/5ML syrup; Take 10 mLs by mouth 3 (three) times daily as needed for cough.  Dispense: 120 mL; Refill: 0 - albuterol (PROVENTIL HFA) 108 (90 Base) MCG/ACT inhaler; Inhale 2 puffs into the lungs every 6 (six) hours as needed for wheezing or shortness of breath.  Dispense: 2 Inhaler; Refill: 1 - DG Chest 2 View; Future   Cheryl Chay Asad A[facility] 06/19/2016 11:24 AM

## 2016-07-03 ENCOUNTER — Encounter: Payer: Self-pay | Admitting: Family Medicine

## 2016-07-03 ENCOUNTER — Ambulatory Visit (INDEPENDENT_AMBULATORY_CARE_PROVIDER_SITE_OTHER): Payer: Medicare Other | Admitting: Family Medicine

## 2016-07-03 VITALS — BP 127/71 | HR 68 | Temp 97.6°F | Resp 16 | Ht 66.0 in | Wt 172.0 lb

## 2016-07-03 DIAGNOSIS — G8929 Other chronic pain: Secondary | ICD-10-CM | POA: Diagnosis not present

## 2016-07-03 DIAGNOSIS — M25562 Pain in left knee: Secondary | ICD-10-CM

## 2016-07-03 DIAGNOSIS — M25561 Pain in right knee: Secondary | ICD-10-CM

## 2016-07-03 DIAGNOSIS — R058 Other specified cough: Secondary | ICD-10-CM

## 2016-07-03 DIAGNOSIS — I10 Essential (primary) hypertension: Secondary | ICD-10-CM

## 2016-07-03 DIAGNOSIS — R05 Cough: Secondary | ICD-10-CM | POA: Diagnosis not present

## 2016-07-03 MED ORDER — TRAMADOL HCL 50 MG PO TABS: 50.0000 mg | ORAL_TABLET | Freq: Two times a day (BID) | ORAL | 0 refills | Status: DC

## 2016-07-03 MED ORDER — METOPROLOL TARTRATE 50 MG PO TABS: ORAL_TABLET | ORAL | 1 refills | Status: DC

## 2016-07-03 NOTE — Progress Notes (Signed)
Name: Jodi Bullock   MRN: [id number]    DOB: 1924-10-23   Date:07/03/2016       Progress Note  Subjective  Chief Complaint  Chief Complaint  Patient presents with  . Acute Visit    Possible bronchitis    Hypertension  This is a chronic problem. The problem is unchanged. The problem is controlled. Pertinent negatives include no blurred vision, chest pain, headaches, malaise/fatigue, palpitations, shortness of breath or sweats. Past treatments include angiotensin blockers and beta blockers. There is no history of kidney disease, CAD/MI or CVA.    Pt. is here for follow up of a recurrent intermittent cough last ing for over a year with some episodic flare ups. She reports the coughing started gradually, she did not feel sick prior to onset. Over the last year, she has noticed the cough gets worse at night and better during the daytime. She has taken Albuterol inhaler and antibiotics, CXRs obtained have shown hyperinflation, no evidence of pneumonia.  She has been evaluated by Cardiologist for the cough and it has been determined that the cough is not from a cardiac source although she does have congestive heart failure.   Past Medical History:  Diagnosis Date  . Arthritis   . Breast cancer ([place])   . Cancer ([place]) 1998   breast  . Depression   . GERD (gastroesophageal reflux disease)   . History of kidney stones   . HLD (hyperlipidemia)   . Hypertension   . Hypertension   . Hypoplasia, thymus gland    cancer  . Neuropathy of both feet   . Systolic CHF with reduced left ventricular function, NYHA class 3 ([place]) 01/10/2016    Past Surgical History:  Procedure Laterality Date  . ABDOMINAL HYSTERECTOMY    . BREAST LUMPECTOMY    . CATARACT EXTRACTION    . CHOLECYSTECTOMY    . JOINT REPLACEMENT Right    Hip  . KNEE SURGERY Right     Family History  Problem Relation Age of Onset  . Stroke Mother   . Diabetes Father   . Heart disease Father   . Kidney disease Neg Hx   .  Bladder Cancer Neg Hx     Social History   Social History  . Marital status: Married    Spouse name: N/A  . Number of children: N/A  . Years of education: N/A   Occupational History  . Not on file.   Social History Main Topics  . Smoking status: Former Research scientist (life sciences)  . Smokeless tobacco: Never Used     Comment: smoked in high school for a few years only  . Alcohol use No  . Drug use: No  . Sexual activity: Not Currently   Other Topics Concern  . Not on file   Social History Narrative  . No narrative on file     Current Outpatient Prescriptions:  .  albuterol (PROVENTIL HFA) 108 (90 Base) MCG/ACT inhaler, Inhale 2 puffs into the lungs every 6 (six) hours as needed for wheezing or shortness of breath., Disp: 2 Inhaler, Rfl: 1 .  Cholecalciferol 2000 UNITS TABS, Take 1 tablet by mouth daily., Disp: , Rfl:  .  fluticasone (FLONASE) 50 MCG/ACT nasal spray, Place 2 sprays into both nostrils at bedtime., Disp: , Rfl:  .  furosemide (LASIX) 20 MG tablet, Take 1 tablet (20 mg total) by mouth daily., Disp: 90 tablet, Rfl: 3 .  LORazepam (ATIVAN) 0.5 MG tablet, Take 1 tablet (0.5 mg total) by mouth  2 (two) times daily as needed for anxiety., Disp: 60 tablet, Rfl: 2 .  losartan (COZAAR) 100 MG tablet, TAKE 1 TABLET(100 MG) BY MOUTH DAILY, Disp: 90 tablet, Rfl: 0 .  lovastatin (MEVACOR) 40 MG tablet, Take 1 tablet (40 mg total) by mouth daily., Disp: 90 tablet, Rfl: 0 .  metoprolol (LOPRESSOR) 50 MG tablet, TAKE 1 TABLET(50 MG) BY MOUTH TWICE DAILY, Disp: 180 tablet, Rfl: 1 .  Multiple Vitamins-Minerals (PRESERVISION/LUTEIN) CAPS, Take 1 each by mouth 2 (two) times daily., Disp: , Rfl:  .  Polyethylene Glycol 1450 LIQD, POLYETHYLENE GLYCOL (Powder) - Historical Medication  17 grams daily Active, Disp: , Rfl:  .  traMADol (ULTRAM) 50 MG tablet, Take 1 tablet (50 mg total) by mouth 2 (two) times daily., Disp: 60 tablet, Rfl: 0  Allergies  Allergen Reactions  . 2,4-D Dimethylamine (Amisol)  Other (See Comments)    Other Reaction: OTHER REACTION-IRRITATION TO Esophagous  . Celebrex [Celecoxib] Other (See Comments)     esophagitis Esophageal spasms    . Ciprofloxacin Other (See Comments)    Esophageal irritation  . Ibuprofen     Other reaction(s): Other (See Comments) reflux  . Metronidazole Other (See Comments)    neuropathy  . Naproxen Other (See Comments)    GI UPSET  . Other Other (See Comments)    Other Reaction: esophagitis     Review of Systems  Constitutional: Negative for chills, fever and malaise/fatigue.  Eyes: Negative for blurred vision.  Respiratory: Positive for cough and sputum production. Negative for shortness of breath and wheezing.   Cardiovascular: Negative for chest pain and palpitations.  Neurological: Negative for headaches.      Objective  Vitals:   07/03/16 1415  BP: 127/71  Pulse: 68  Resp: 16  Temp: 97.6 F (36.4 C)  TempSrc: Oral  SpO2: 95%  Weight: 172 lb (78 kg)  Height: 5\' 6"  (1.676 m)    Physical Exam  Constitutional: She is oriented to person, place, and time and well-developed, well-nourished, and in no distress.  Cardiovascular: Normal rate, regular rhythm and normal heart sounds.   No murmur heard. Pulmonary/Chest: Effort normal and breath sounds normal. She has no wheezes.  Neurological: She is alert and oriented to person, place, and time.  Psychiatric: Mood, memory, affect and judgment normal.  Nursing note and vitals reviewed.     Assessment & Plan  1. Recurrent productive cough Unclear etiology, referral to pulmonary for further manage - Ambulatory referral to Pulmonology  2. Bilateral chronic knee pain Stable, taking tramadol 50 mg twice a day as needed, refills provided - traMADol (ULTRAM) 50 MG tablet; Take 1 tablet (50 mg total) by mouth 2 (two) times daily.  Dispense: 60 tablet; Refill: 0  3. Essential hypertension BP stable and controlled on present antihypertensive therapy - metoprolol  (LOPRESSOR) 50 MG tablet; TAKE 1 TABLET(50 MG) BY MOUTH TWICE DAILY  Dispense: 180 tablet; Refill: 1   Sayer Masini [facility] 07/03/2016 2:38 PM

## 2016-07-10 ENCOUNTER — Telehealth: Payer: Self-pay | Admitting: Cardiovascular Disease

## 2016-07-10 NOTE — Telephone Encounter (Signed)
Called patient to schedule an appt with Pulmonology.  Patient now having issues with legs    Patient says she has blood pool in  R Leg and pain into groin  .  S/p leg and hip replacement now has vein problems.  Please call to discuss.

## 2016-07-10 NOTE — Telephone Encounter (Signed)
Left message on machine for patient to contact the office.   

## 2016-07-10 NOTE — Telephone Encounter (Signed)
Pt reports pain in her groin and right leg for two weeks. She recently stepped up on a cushion and felt a "pull" in her leg and ever since, she feels pain "on and off" in her leg. States her PCP is aware.  She also reports blood pooling around her right ankle. When I asked how she was sure it was blood and not other excess fluid, she reports she is just sure. Left leg and ankle looks/feels normal. Pt has not taken lasix since early February when she was sick as she was unable to make it to the bathroom.  She does not weigh herself on a regular basis but knows her weight is upper 160s-170.   I advised pt to restart lasix, elevate right leg and notify PCP of sx. Reviewed w/Ryan Idolina Primer, PA-C who is agreeable w/plan.  Called pt back who states when she saw PCP Feb 19 it was only for bronchitis and she could only talk to him about that one problem. She voiced that she is agitated that no one is listening. I offered an appt w/Dr. Fletcher Anon or Christell Faith, PA-C. She is agreeable. Appt scheduled March 9, 2pm. Called pt back x 2; call went to voice mail. Left message for pt to call the office.   Pt returned call and is agreeable to scheduled appt. She understands to take lasix as prescribed and call if sx worsen.

## 2016-07-20 ENCOUNTER — Emergency Department: Payer: Medicare Other

## 2016-07-20 ENCOUNTER — Emergency Department
Admission: EM | Admit: 2016-07-20 | Discharge: 2016-07-20 | Disposition: A | Discharge status: Home / Self Care | Payer: Medicare Other | Attending: Emergency Medicine | Admitting: Emergency Medicine

## 2016-07-20 ENCOUNTER — Encounter: Payer: Self-pay | Admitting: Emergency Medicine

## 2016-07-20 DIAGNOSIS — Z87891 Personal history of nicotine dependence: Secondary | ICD-10-CM | POA: Diagnosis not present

## 2016-07-20 DIAGNOSIS — Y92481 Parking lot as the place of occurrence of the external cause: Secondary | ICD-10-CM | POA: Insufficient documentation

## 2016-07-20 DIAGNOSIS — Y999 Unspecified external cause status: Secondary | ICD-10-CM | POA: Insufficient documentation

## 2016-07-20 DIAGNOSIS — W010XXA Fall on same level from slipping, tripping and stumbling without subsequent striking against object, initial encounter: Secondary | ICD-10-CM | POA: Insufficient documentation

## 2016-07-20 DIAGNOSIS — S4991XA Unspecified injury of right shoulder and upper arm, initial encounter: Secondary | ICD-10-CM | POA: Diagnosis present

## 2016-07-20 DIAGNOSIS — I5022 Chronic systolic (congestive) heart failure: Secondary | ICD-10-CM | POA: Insufficient documentation

## 2016-07-20 DIAGNOSIS — I110 Hypertensive heart disease with heart failure: Secondary | ICD-10-CM | POA: Diagnosis not present

## 2016-07-20 DIAGNOSIS — Y9301 Activity, walking, marching and hiking: Secondary | ICD-10-CM | POA: Insufficient documentation

## 2016-07-20 DIAGNOSIS — Z853 Personal history of malignant neoplasm of breast: Secondary | ICD-10-CM | POA: Insufficient documentation

## 2016-07-20 DIAGNOSIS — Z79899 Other long term (current) drug therapy: Secondary | ICD-10-CM | POA: Diagnosis not present

## 2016-07-20 DIAGNOSIS — S42291A Other displaced fracture of upper end of right humerus, initial encounter for closed fracture: Secondary | ICD-10-CM | POA: Insufficient documentation

## 2016-07-20 MED ORDER — TRAMADOL HCL 50 MG PO TABS
50.0000 mg | ORAL_TABLET | Freq: Once | ORAL | Status: AC
  Administered 2016-07-20: 50 mg via ORAL
  Filled 2016-07-20: qty 1

## 2016-07-20 MED ORDER — TRAMADOL HCL 50 MG PO TABS: 50.0000 mg | ORAL_TABLET | Freq: Three times a day (TID) | ORAL | 0 refills | Status: AC | PRN

## 2016-07-20 MED ORDER — ACETAMINOPHEN 325 MG PO TABS
650.0000 mg | ORAL_TABLET | Freq: Once | ORAL | Status: AC
  Administered 2016-07-20: 650 mg via ORAL
  Filled 2016-07-20: qty 2

## 2016-07-20 NOTE — ED Provider Notes (Signed)
[facility] Emergency Department Provider Note ____________________________________________  Time seen: 1800  I have reviewed the triage vital signs and the nursing notes.  HISTORY  Chief Complaint  Fall  HPI Jodi Bullock is a 82 y.o. female presents to the ED via EMS from [place]. She had just visited her husband, who is there for skilled nursing rehab following a fall and subsequent hip fracture. She reports a mechanical fall as she approached her car. She recalls falling to the right side, as she feels like her right foot "got caught." She has a history of peripheral neuropathy, and admits to some mild paresthesias to the foot. She denies a head injury, LOC, or other injury. She notes her eyeglasses caused a small abrasion to her right brow. Her history is significant for TKA, THA, and bilateral ORIF to the ulnae.   Past Medical History:  Diagnosis Date  . Arthritis   . Breast cancer ([place])   . Cancer ([place]) 1998   breast  . Depression   . GERD (gastroesophageal reflux disease)   . History of kidney stones   . HLD (hyperlipidemia)   . Hypertension   . Hypertension   . Hypoplasia, thymus gland    cancer  . Neuropathy of both feet   . Systolic CHF with reduced left ventricular function, NYHA class 3 ([place]) 01/10/2016    Patient Active Problem List   Diagnosis Date Noted  . Acute bronchitis 06/19/2016  . Recurrent productive cough 06/14/2016  . Medicare annual wellness visit, subsequent 05/17/2016  . Fall in elderly patient 01/27/2016  . Sprain of wrist, right 01/26/2016  . Systolic CHF with reduced left ventricular function, NYHA class 3 ([place]) 01/10/2016  . Productive cough 11/19/2015  . Chronic systolic heart failure ([place]) 10/12/2015  . Dyspnea on exertion 08/31/2015  . Fatigue 08/31/2015  . 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 Irregular heart rhythm 07/22/2015  . Left arm pain 06/29/2015  . Dyslipidemia 06/03/2015  . Acute  recurrent maxillary sinusitis 06/03/2015  . Oral mucosal lesion 05/26/2015  . Urge incontinence 02/14/2015  . Ankle edema 02/02/2015  . At risk for falling 01/25/2015  . Dizziness 01/25/2015  . Acid reflux 01/25/2015  . Big thyroid 01/25/2015  . Gravida 2 para 2 01/25/2015  . Personal history of malignant neoplasm of breast 01/25/2015  . Arthritis, degenerative 01/25/2015  . Parity 2 01/25/2015  . Peripheral blood vessel disorder ([place]) 01/25/2015  . Need for vaccination 01/25/2015  . Pain in rectum 01/25/2015  . Reflux 01/25/2015  . Screening for depression 01/25/2015  . Disease of accessory sinus 01/25/2015  . Headache, temporal 01/25/2015  . Primary cancer of thymus ([place]) 01/25/2015  . Disease of thyroid gland 01/25/2015  . Avitaminosis D 01/25/2015  . Family history of diabetes mellitus in father 12/31/2014  . Fasting hyperglycemia 12/31/2014  . Hypertension 12/01/2014  . HLD (hyperlipidemia) 12/01/2014  . Anxiety 12/01/2014  . Myofascial pain 12/01/2014  . Breast CA ([place]) 11/24/2014  . Absence of bladder continence 11/24/2014  . Urgency of micturation 11/24/2014  . Common bile duct dilatation 10/20/2014  . Pancreatic cyst 10/20/2014  . Kidney stones 10/20/2014  . Neuritis or radiculitis due to rupture of lumbar intervertebral disc 06/15/2014  . Lumbar canal stenosis 06/15/2014  . Degenerative arthritis of lumbar spine 06/15/2014  . 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 Bloodgood disease 11/05/2012  . Arthritis of temporomandibular joint 06/24/2012  . Atypical chest pain 01/05/2012  . Breath shortness 12/04/2011  .  Lung mass 11/15/2011  . Thyroid nodule 11/15/2011  . Age-related macular degeneration, dry 10/03/2011  . Disorder of peripheral nervous system ([place]) 10/03/2011  . Chronic rhinitis 03/03/2011  . Carotid artery narrowing 03/01/2011  . Polypharmacy 12/02/2010    Past Surgical History:  Procedure Laterality Date  . ABDOMINAL HYSTERECTOMY    .  BREAST LUMPECTOMY    . CATARACT EXTRACTION    . CHOLECYSTECTOMY    . JOINT REPLACEMENT Right    Hip  . KNEE SURGERY Right     Prior to Admission medications   Medication Sig Start Date End Date Taking? Authorizing Provider  albuterol (PROVENTIL HFA) 108 (90 Base) MCG/ACT inhaler Inhale 2 puffs into the lungs every 6 (six) hours as needed for wheezing or shortness of breath. 06/19/16   Roselee Nova, MD  Cholecalciferol 2000 UNITS TABS Take 1 tablet by mouth daily.    Roselee Nova, MD  fluticasone (FLONASE) 50 MCG/ACT nasal spray Place 2 sprays into both nostrils at bedtime.    Historical Provider, MD  furosemide (LASIX) 20 MG tablet Take 1 tablet (20 mg total) by mouth daily. 02/01/16   Wellington Hampshire, MD  LORazepam (ATIVAN) 0.5 MG tablet Take 1 tablet (0.5 mg total) by mouth 2 (two) times daily as needed for anxiety. 05/12/16   Roselee Nova, MD  losartan (COZAAR) 100 MG tablet TAKE 1 TABLET(100 MG) BY MOUTH DAILY 06/09/16   Roselee Nova, MD  lovastatin (MEVACOR) 40 MG tablet Take 1 tablet (40 mg total) by mouth daily. 05/17/16   Roselee Nova, MD  metoprolol (LOPRESSOR) 50 MG tablet TAKE 1 TABLET(50 MG) BY MOUTH TWICE DAILY 07/03/16   Roselee Nova, MD  Multiple Vitamins-Minerals (PRESERVISION/LUTEIN) CAPS Take 1 each by mouth 2 (two) times daily.    Historical Provider, MD  Polyethylene Glycol 1450 LIQD POLYETHYLENE GLYCOL (Powder) - Historical Medication  17 grams daily Active    Roselee Nova, MD  traMADol (ULTRAM) 50 MG tablet Take 1 tablet (50 mg total) by mouth 2 (two) times daily. 07/03/16   Roselee Nova, MD  traMADol (ULTRAM) 50 MG tablet Take 1 tablet (50 mg total) by mouth 3 (three) times daily as needed. 07/20/16 07/27/16  Emmanuela Ghazi V Bacon Annlouise Gerety, PA-C    Allergies 2,4-d dimethylamine (amisol); Celebrex [celecoxib]; Ciprofloxacin; Ibuprofen; Metronidazole; Naproxen; and Other  Family History  Problem Relation Age of Onset  . Stroke Mother   . Diabetes  Father   . Heart disease Father   . Kidney disease Neg Hx   . Bladder Cancer Neg Hx     Social History Social History  Substance Use Topics  . Smoking status: Former Research scientist (life sciences)  . Smokeless tobacco: Never Used     Comment: smoked in high school for a few years only  . Alcohol use No    Review of Systems  Constitutional: Negative for fever. Cardiovascular: Negative for chest pain. Respiratory: Negative for shortness of breath. Gastrointestinal: Negative for abdominal pain, vomiting and diarrhea. Musculoskeletal: Negative for back pain. Right arm pain  Skin: Negative for rash. Neurological: Negative for headaches, focal weakness or numbness. ____________________________________________  PHYSICAL EXAM:  VITAL SIGNS: ED Triage Vitals  Enc Vitals Group     BP 07/20/16 1754 (!) 182/83     Pulse Rate 07/20/16 1754 68     Resp 07/20/16 1754 18     Temp 07/20/16 1754 98 F (36.7 C)  Temp Source 07/20/16 1754 Oral     SpO2 07/20/16 1754 96 %     Weight 07/20/16 1752 168 lb (76.2 kg)     Height 07/20/16 1752 5\' 6"  (1.676 m)     Head Circumference --      Peak Flow --      Pain Score 07/20/16 1752 6     Pain Loc --      Pain Edu? --      Excl. in [place]? --     Constitutional: Alert and oriented. Well appearing and in no distress. Head: Normocephalic and atraumatic. Cardiovascular: Normal rate, regular rhythm. Normal distal pulses. Respiratory: Normal respiratory effort. No wheezes/rales/rhonchi. Gastrointestinal: Soft and nontender. No distention. Musculoskeletal: Right shoulder without obvious deformity, dislocation, or sulcus sign. Decreased ROM due to pain. Patient localizes pain to the mid to proximal humerus. Nontender with normal range of motion in all extremities.  Neurologic:  Normal gait without ataxia. Normal speech and language. No gross focal neurologic deficits are appreciated. Skin:  Skin is warm, dry and intact. No rash  noted. ____________________________________________   RADIOLOGY  Right Humerus IMPRESSION: Mildly displaced proximal humerus fracture. No dislocation of the humeral head.  Right Shoulder 1 V IMPRESSION: 1. Mildly impacted fracture involving the right humeral head and neck. 2. Anatomic alignment of the glenohumeral joint with a well-preserved joint space. 3. Nodular opacity projected over the visualized left lung which may be artifactual. A one view chest x-ray may be helpful to confirm or deny this finding.  CXR 1V IMPRESSION: Chronic stable bronchitic change. Postsurgical scarring with chain sutures in the right lower lobe. No mass nor pneumonic consolidation is identified radiographically. Stable cardiomegaly with aortic atherosclerosis.  I, Simona Rocque, Dannielle Karvonen, personally viewed and evaluated these images (plain radiographs) as part of my medical decision making, as well as reviewing the written report by the radiologist. ____________________________________________  PROCEDURES  Arm sling Tylenol 650 mg PO Ultram 50 mg PO ____________________________________________  INITIAL IMPRESSION / ASSESSMENT AND PLAN / ED COURSE  ----------------------------------------- 7:35 PM on 07/20/2016 ----------------------------------------- Spoke with Lavella Hammock, MD (ortho) he reviewed images and agreed with plan to sling and follow-up in the office on Monday. Low likelihood of surgical intervention.   Patient with initial fracture care of a closed nondisplaced proximal humeral fracture on x-ray. Patient is put in an appropriate arm sling for comfort. If description for Ultram was provided for pain relief. She will follow-up with Dr. Rudene Christians on Monday for ongoing fracture management. Patient is discharged to the care of her adult daughter at this time. ____________________________________________  FINAL CLINICAL IMPRESSION(S) / ED DIAGNOSES  Final diagnoses:  Other closed  displaced fracture of proximal end of right humerus, initial encounter      Melvenia Needles, PA-C 07/20/16 Ola Quigley, MD 07/20/16 2303

## 2016-07-20 NOTE — ED Triage Notes (Signed)
Brought in via ems s/p fall   States she was walking to her car and tripped in the parking lot at [employer] on right arm . Having pain to right elbow and shoulder  abrasions

## 2016-07-20 NOTE — Discharge Instructions (Signed)
Wear the sling while out of bed. Take the Ultram as needed up to 3 times a day. Apply ice as needed for pain relief. Follow-up with Dr. Rudene Christians on Monday, as discussed. Return to the ED as needed.

## 2016-07-21 ENCOUNTER — Ambulatory Visit: Payer: Medicare Other | Admitting: Physician Assistant

## 2016-07-25 ENCOUNTER — Telehealth: Payer: Self-pay | Admitting: Family Medicine

## 2016-07-25 NOTE — Telephone Encounter (Signed)
Patient has had this chronic cough which has been evaluated in the past. She needs either an appointment with PCP or be seen by an urgent care for evaluation of this cough.

## 2016-07-25 NOTE — Telephone Encounter (Signed)
Pt states she fell last week and fractured her shoulder. Pt has a bad cough and wants to know if something could be called in. Pt does have someone that could go pick her RX up for her. Pt also states her husband is in rehab for a broken hip.

## 2016-07-26 NOTE — Telephone Encounter (Signed)
LMOM to inform pt °

## 2016-08-09 IMAGING — MR MR MRCP
5 of 8 series · 24 of 48 positions shown · non-contrast
Comparison: Ultrasound 09/25/2014

CLINICAL DATA: Left-sided abdominal pain.

EXAM:
MRI ABDOMEN WITHOUT CONTRAST  (INCLUDING MRCP)
TECHNIQUE: Multiplanar multisequence MR imaging of the abdomen was performed.
Heavily T2-weighted images of the biliary and pancreatic ducts were
obtained, and three-dimensional MRCP images were rendered by post
processing.

[Series 2: T2 fat-sat · axial · 7.0mm · 0.78mm/px · z∈[-87,+137]mm · 5 of 26 slices shown]
[im 1/26]
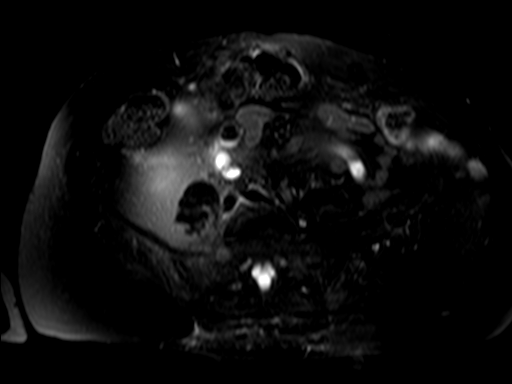
[im 7/26]
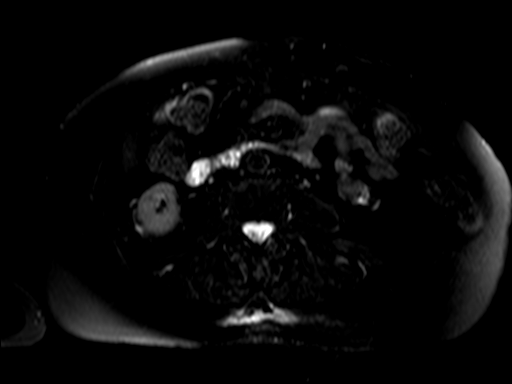
[im 13/26]
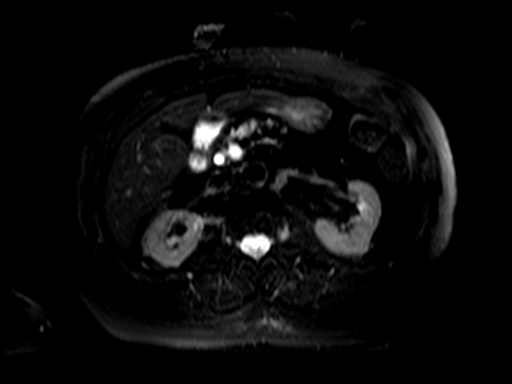
[im 19/26]
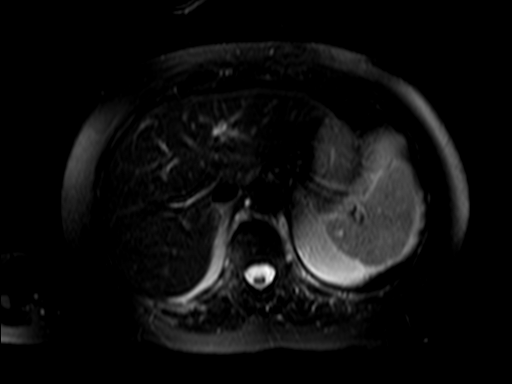
[im 26/26]
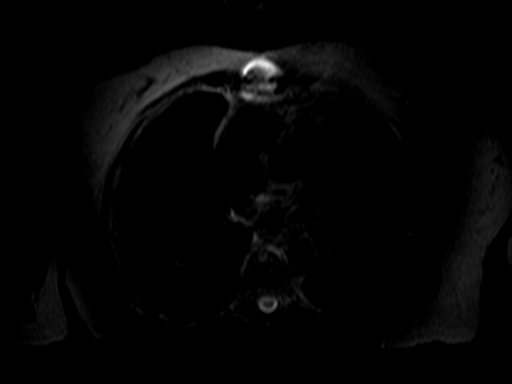

[Series 3: cor true fisp · coronal · 5.0mm · 0.82mm/px · 7 of 39 slices shown]
[im 1/39]
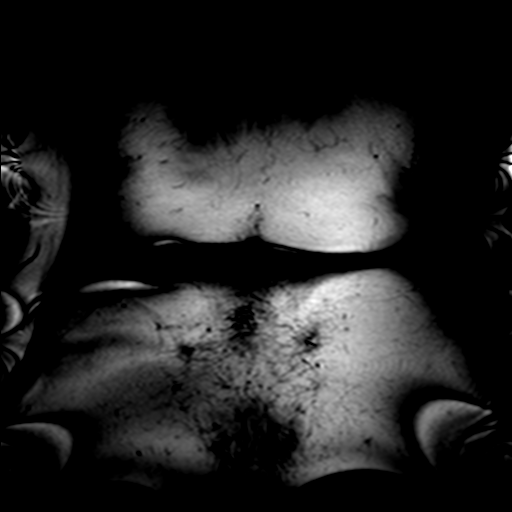
[im 7/39]
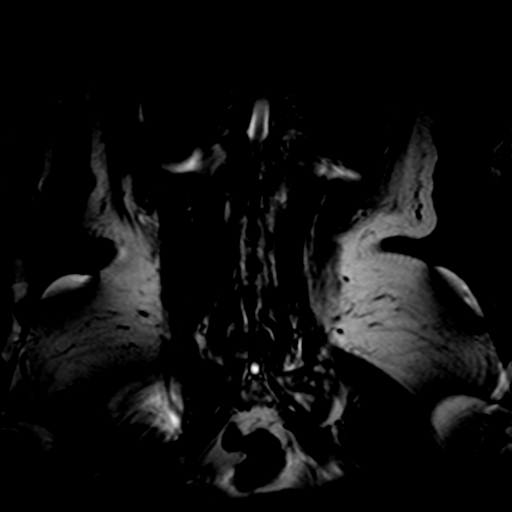
[im 13/39]
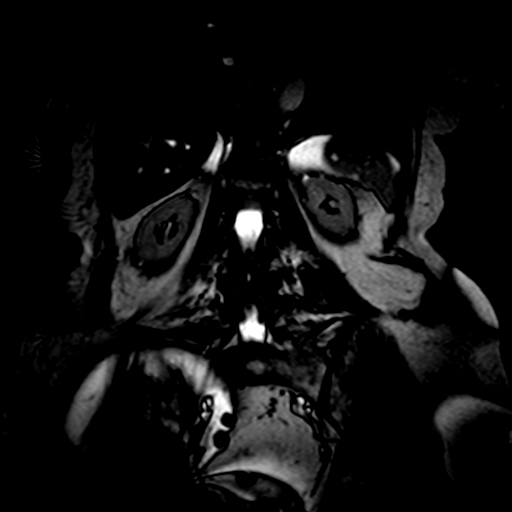
[im 20/39]
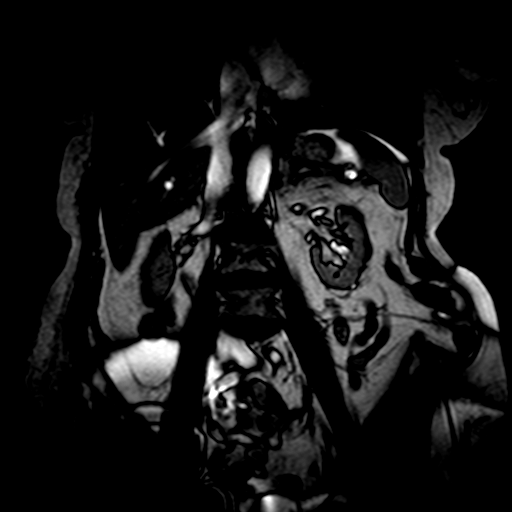
[im 26/39]
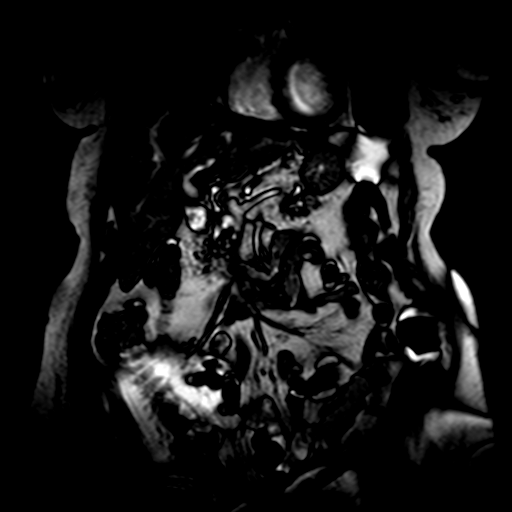
[im 32/39]
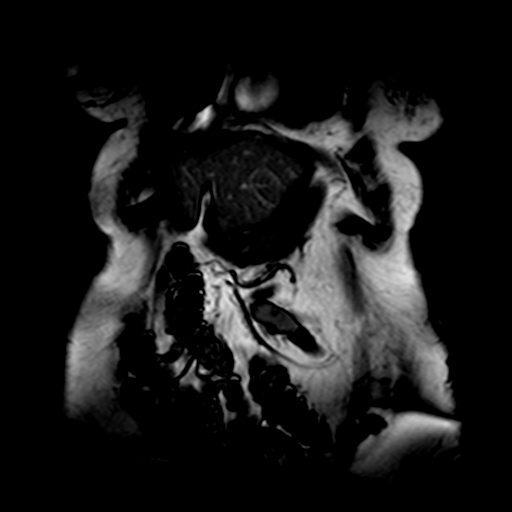
[im 39/39]
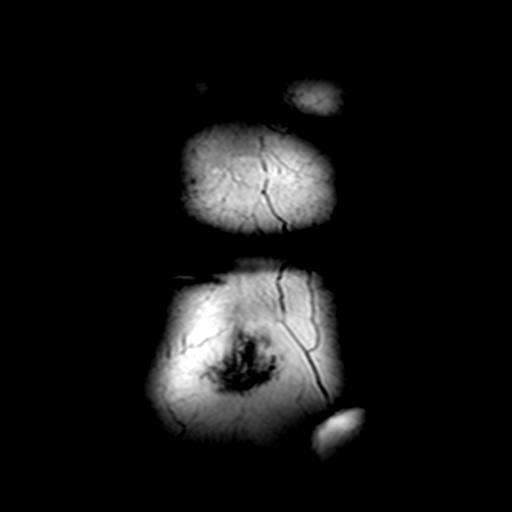

[Series 8: cor thins · coronal · 3.0mm · 0.74mm/px · 3 of 19 slices shown]
[im 1/19]
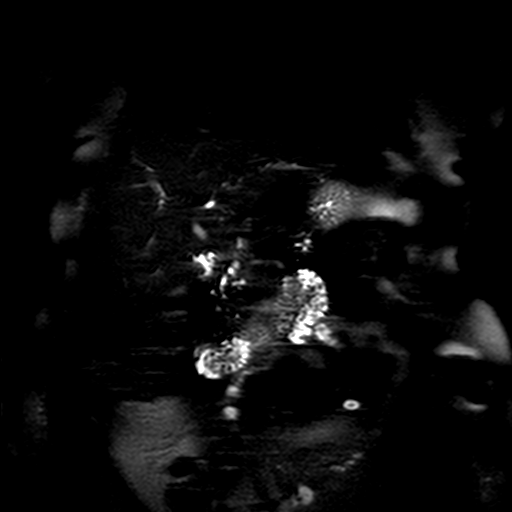
[im 10/19]
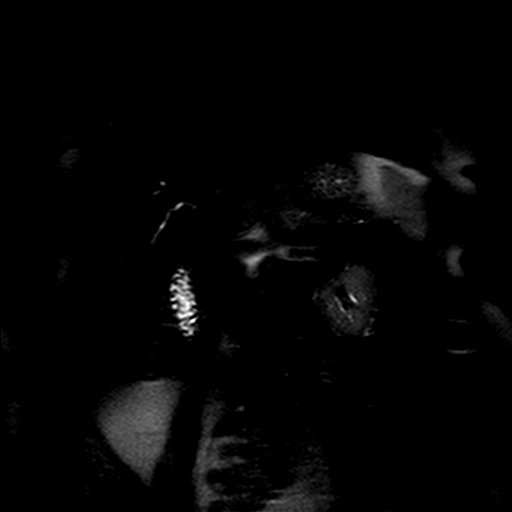
[im 19/19]
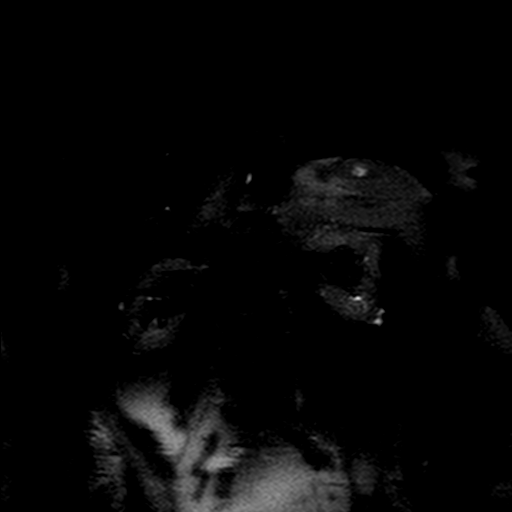

[Series 15: T2 · axial · 7.0mm · 1.56mm/px · z∈[-64,+160]mm · 5 of 26 slices shown]
[im 1/26]
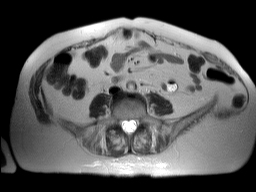
[im 7/26]
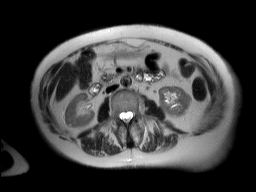
[im 13/26]
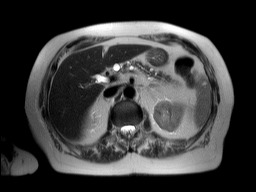
[im 19/26]
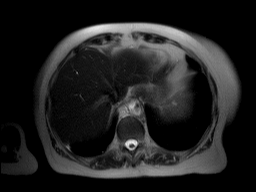
[im 26/26]
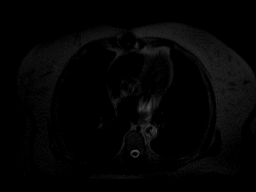

[Series 16: out of phase · axial · 7.0mm · 0.74mm/px · z∈[-83,+105]mm · 4 of 30 slices shown]
[im 1/30]
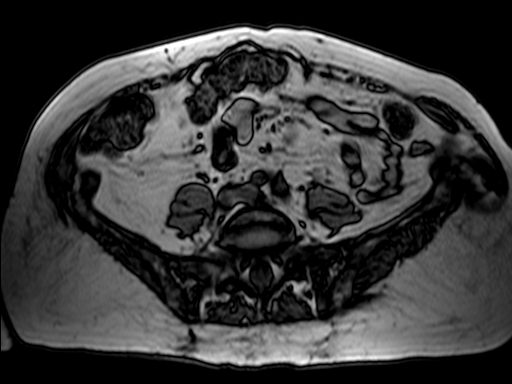
[im 8/30]
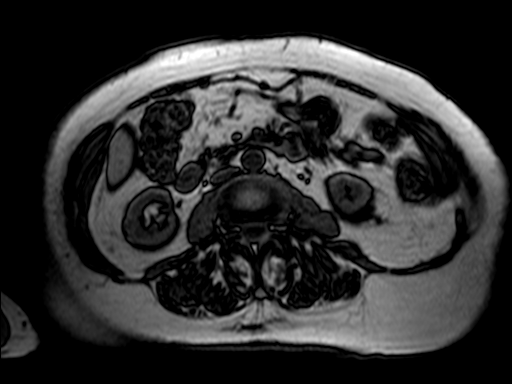
[im 15/30]
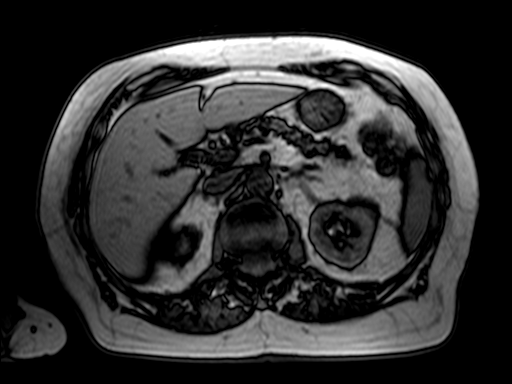
[im 22/30]
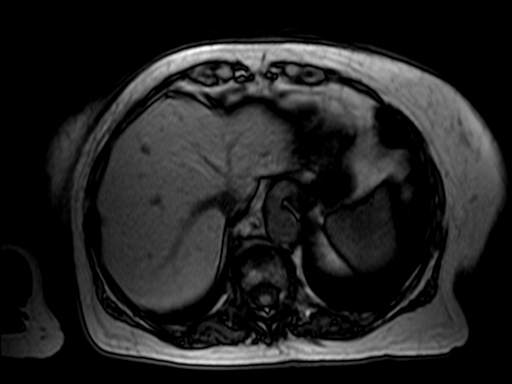

[24 of 48 positions shown; findings below may reference images not displayed]

FINDINGS: Lower chest:  Lung bases are clear.

Hepatobiliary: There is no focal hepatic lesion. There is no
intrahepatic biliary duct dilatation. The common bile duct measures
8 mm in the common hepatic duct measures 8 mm. There is no filling
defect within the common bile duct. Patient status post
cholecystectomy.

Pancreas: Pancreatic duct is mildly dilated to 3 mm through the body
and tail. There are multiple foci of side branch ductal ectasia and
cystic lesions throughout the body and tail the pancreas. These
cystic lesions measure a 12 mm or less. Example lesion measuring 11
mm on image 14, series 15.

Spleen: There several fluid intensity lesions within the spleen.

Adrenals/urinary tract: Adrenal glands are normal. There is no mass
lesion in the left kidney. The abnormality on comparison ultrasound
appears to represent a benign lobulation in the mid left kidney best
seen on coronal image 19, series 3. Potential fatty lesion extending
from the lower pole of the left kidney which may represent a benign
angiomyolipoma (image 22, series 16). The right kidney is normal.

Stomach/Bowel: Stomach and limited of the small bowel is
unremarkable

Vascular/Lymphatic: Abdominal aortic normal caliber. No
retroperitoneal periportal lymphadenopathy.

Musculoskeletal: No aggressive osseous lesion
IMPRESSION: 1. Mild dilatation of the common bile duct and pancreatic duct. No
obstructing lesion identified. Cannot exclude an ampullary lesion.
Recommend correlation with bilirubin levels.
2. Multiple cystic lesions throughout the body and tail of the
pancreas with extensive side branch ductal ectasia. These may
represent benign postinflammatory cysts but cannot exclude small
intra papillary ductal mucinous tumors. For ;esions of this size
(less than 20 mm) recommend a single follow-up MRI in 12 months.
This recommendation follows ACR consensus guidelines: Managing
Incidental Findings on Abdominal CT: White Paper of the ACR
Incidental Findings Committee. [HOSPITAL] 3636;[DATE]
3. No worrisome lesion in the left kidney.

## 2016-08-14 ENCOUNTER — Telehealth: Payer: Self-pay | Admitting: Family Medicine

## 2016-08-14 DIAGNOSIS — M25562 Pain in left knee: Principal | ICD-10-CM

## 2016-08-14 DIAGNOSIS — M25561 Pain in right knee: Principal | ICD-10-CM

## 2016-08-14 DIAGNOSIS — G8929 Other chronic pain: Secondary | ICD-10-CM

## 2016-08-14 DIAGNOSIS — F419 Anxiety disorder, unspecified: Secondary | ICD-10-CM

## 2016-08-14 NOTE — Telephone Encounter (Signed)
Pt needs refill on Tramodol and Lorazepam. Pt has to find a ride to come and pick it up. Pt will be out of her medication by the end of the week. Pt has her son at her house today and would like this today so he can come and pick it up. Please advise.

## 2016-08-16 MED ORDER — LORAZEPAM 0.5 MG PO TABS: 0.5000 mg | ORAL_TABLET | Freq: Two times a day (BID) | ORAL | 2 refills | Status: DC | PRN

## 2016-08-16 MED ORDER — TRAMADOL HCL 50 MG PO TABS: 50.0000 mg | ORAL_TABLET | Freq: Two times a day (BID) | ORAL | 0 refills | Status: DC

## 2016-08-16 NOTE — Telephone Encounter (Signed)
Prescription for tramadol and lorazepam is printed and is ready for pickup

## 2016-08-16 NOTE — Telephone Encounter (Signed)
Script ready for pick up 

## 2016-08-22 ENCOUNTER — Telehealth: Payer: Self-pay | Admitting: Family Medicine

## 2016-08-22 ENCOUNTER — Institutional Professional Consult (permissible substitution): Payer: Medicare Other | Admitting: Pulmonary Disease

## 2016-08-22 ENCOUNTER — Other Ambulatory Visit: Payer: Self-pay | Admitting: Emergency Medicine

## 2016-08-22 DIAGNOSIS — E785 Hyperlipidemia, unspecified: Secondary | ICD-10-CM

## 2016-08-22 MED ORDER — LOVASTATIN 40 MG PO TABS: 40.0000 mg | ORAL_TABLET | Freq: Every day | ORAL | 0 refills | Status: DC

## 2016-08-22 NOTE — Telephone Encounter (Signed)
Requesting refill on lovastatin asking that you send to [facility] [address]

## 2016-08-22 NOTE — Telephone Encounter (Signed)
Script sent  

## 2016-08-23 ENCOUNTER — Telehealth: Payer: Self-pay | Admitting: Family Medicine

## 2016-08-23 NOTE — Telephone Encounter (Signed)
Merry Proud from [place] at [facility] requesting verbal order for medical social worker for [employer]. Please return his call [phone]

## 2016-08-24 NOTE — Telephone Encounter (Signed)
Patient must come in to be seen first.

## 2016-09-11 ENCOUNTER — Telehealth: Payer: Self-pay | Admitting: Family Medicine

## 2016-09-11 NOTE — Telephone Encounter (Signed)
Requesting refill on Tramadol. 

## 2016-09-12 ENCOUNTER — Other Ambulatory Visit: Payer: Self-pay | Admitting: Emergency Medicine

## 2016-09-12 DIAGNOSIS — G8929 Other chronic pain: Secondary | ICD-10-CM

## 2016-09-12 DIAGNOSIS — M25561 Pain in right knee: Principal | ICD-10-CM

## 2016-09-12 DIAGNOSIS — M25562 Pain in left knee: Principal | ICD-10-CM

## 2016-09-12 MED ORDER — TRAMADOL HCL 50 MG PO TABS: 50.0000 mg | ORAL_TABLET | Freq: Two times a day (BID) | ORAL | 0 refills | Status: DC

## 2016-09-12 NOTE — Telephone Encounter (Signed)
Script ready for pick up 

## 2016-09-25 ENCOUNTER — Encounter: Payer: Self-pay | Admitting: Pulmonary Disease

## 2016-09-25 ENCOUNTER — Telehealth: Payer: Self-pay | Admitting: Family Medicine

## 2016-09-25 ENCOUNTER — Ambulatory Visit (INDEPENDENT_AMBULATORY_CARE_PROVIDER_SITE_OTHER): Payer: Medicare Other | Admitting: Pulmonary Disease

## 2016-09-25 VITALS — BP 160/70 | HR 70 | Resp 16 | Ht 66.0 in | Wt 162.0 lb

## 2016-09-25 DIAGNOSIS — R05 Cough: Secondary | ICD-10-CM | POA: Diagnosis not present

## 2016-09-25 DIAGNOSIS — R053 Chronic cough: Secondary | ICD-10-CM

## 2016-09-25 DIAGNOSIS — J45909 Unspecified asthma, uncomplicated: Secondary | ICD-10-CM | POA: Diagnosis not present

## 2016-09-25 MED ORDER — FLUTICASONE FUROATE-VILANTEROL 100-25 MCG/INH IN AEPB: 1.0000 | INHALATION_SPRAY | Freq: Every day | RESPIRATORY_TRACT | 5 refills | Status: DC

## 2016-09-25 NOTE — Progress Notes (Signed)
PULMONARY CONSULT NOTE  Requesting MD/Service: Manuella Ghazi, MD Date of initial consultation: 09/25/16 Reason for consultation: Cough  PT PROFILE: 83 y.o. female never smoker referred for evaluation and mgmt of chronic cough  HPI:  This is a very pleasant and highly independent 83 year old woman referred for evaluation of cough of greater than 1 year duration. Her cough is described as nonproductive or minimally productive. When she coughs up mucus it is clear to slightly discolored. It occurs both day and night. It can awaken her from sleep. She has chronic rhinitis and does report posterior nasal drainage. She is on Flonase and nasal rinses for this. She has a prior history of sinus surgeries. She also has a history of "esophageal spasms". She is on chronic PPI therapy. She does have mild intermittent exertional dyspnea. She denies chest pain, pressure, tightness and heaviness. She denies hemoptysis, fever, orthopnea, PND, lower extremity edema and calf tenderness. She has a past medical history of breast cancer, status post resection. She has also undergone thymectomy for a thymus cancer. At the time of the thymectomy (2010) she had a right lower lobe nodule that was resected. She reports that this was a malignant nodule.  For the cough, she's been prescribed antibiotics without improvement. She is been prescribed an albuterol inhaler which she has only used a handful of times with without discernible improvement.  Past Medical History:  Diagnosis Date  . Arthritis   . Breast cancer ([place])   . Cancer ([place]) 1998   breast  . Depression   . GERD (gastroesophageal reflux disease)   . History of kidney stones   . HLD (hyperlipidemia)   . Hypertension   . Hypertension   . Hypoplasia, thymus gland    cancer  . Neuropathy of both feet   . Systolic CHF with reduced left ventricular function, NYHA class 3 ([place]) 01/10/2016    Past Surgical History:  Procedure Laterality Date  . ABDOMINAL HYSTERECTOMY     . BREAST LUMPECTOMY    . CATARACT EXTRACTION    . CHOLECYSTECTOMY    . JOINT REPLACEMENT Right    Hip  . KNEE SURGERY Right     MEDICATIONS: I have reviewed all medications and confirmed regimen as documented  Social History   Social History  . Marital status: Married    Spouse name: N/A  . Number of children: N/A  . Years of education: N/A   Occupational History  . Not on file.   Social History Main Topics  . Smoking status: Former Research scientist (life sciences)  . Smokeless tobacco: Never Used     Comment: smoked in high school for a few years only  . Alcohol use No  . Drug use: No  . Sexual activity: Not Currently   Other Topics Concern  . Not on file   Social History Narrative  . No narrative on file    Family History  Problem Relation Age of Onset  . Stroke Mother   . Diabetes Father   . Heart disease Father   . Kidney disease Neg Hx   . Bladder Cancer Neg Hx     ROS: No fever, myalgias/arthralgias, unexplained weight loss or weight gain No new focal weakness or sensory deficits No otalgia, hearing loss, visual changes, nasal and sinus symptoms, mouth and throat problems No neck pain or adenopathy No abdominal pain, N/V/D, diarrhea, change in bowel pattern No dysuria, change in urinary pattern   Vitals:   09/25/16 1131 09/25/16 1136  BP:  (!) 160/70  Pulse:  70  Resp: 16   SpO2:  95%  Weight: 162 lb (73.5 kg) 162 lb (73.5 kg)  Height: 5\' 6"  (1.676 m)      EXAM:  Gen: WDWN, Appears younger than her true age, No overt respiratory distress HEENT: NCAT, sclera white, oropharynx normal, mild rhinitis Neck: Supple without LAN, thyromegaly, JVD Lungs: breath sounds slightly coarse, percussion normal, few scattered wheezes Cardiovascular: RRR, no murmurs noted Abdomen: Soft, nontender, normal BS Ext: without clubbing, cyanosis, edema. Bilateral varicosities Neuro: CNs grossly intact, motor and sensory intact Skin: Limited exam, no lesions noted  DATA:   BMP  Latest Ref Rng & Units 05/17/2016 01/23/2016 09/22/2015  Glucose 65 - 99 mg/dL 104(H) 147(H) 104(H)  BUN 7 - 25 mg/dL 15 21(H) 14  Creatinine 0.60 - 0.88 mg/dL 0.76 0.82 0.76  BUN/Creat Ratio 12 - 28 - - 18  Sodium 135 - 146 mmol/L 142 141 143  Potassium 3.5 - 5.3 mmol/L 5.3 4.2 4.3  Chloride 98 - 110 mmol/L 105 108 103  CO2 20 - 31 mmol/L 30 24 25   Calcium 8.6 - 10.4 mg/dL 9.2 8.9 9.1    CBC Latest Ref Rng & Units 01/23/2016 08/31/2015 06/21/2015  WBC 3.6 - 11.0 K/uL 7.4 7.4 7.0  Hemoglobin 12.0 - 16.0 g/dL 14.0 - 13.5  Hematocrit 35.0 - 47.0 % 40.1 41.9 41.4  Platelets 150 - 440 K/uL 236 179 157    CXR:  06/19/16 chronic, postsurgical changes in the right lower lobe. No acute disease  IMPRESSION:   1) chronic cough 2)  Wheezing on exam. I suspect that this indicates some component of asthmatic bronchitis.  PLAN:  Trial of Breo inhaler 100-25. Samples and prescription provided. Continue Flonase and nasal rinses Continue lansoprazole Follow-up in 2-4 weeks. If cough is no better, consider CT scan of chest to rule out bronchiectasis   Merton Border, MD PCCM service Mobile [phone] Pager [phone] 09/25/2016

## 2016-09-25 NOTE — Patient Instructions (Signed)
I think the cause of your cough is due to asthmatic bronchitis  We will begin a trial of Breo inhaler - one inhalation each morning. Rinse mouth after use.  Samples provided and prescription entered  Follow-up in 2-4 weeks

## 2016-09-25 NOTE — Telephone Encounter (Signed)
PT NEEDS REFILL ON LOSARTAN 100MG . [place]

## 2016-09-27 ENCOUNTER — Other Ambulatory Visit: Payer: Self-pay | Admitting: Emergency Medicine

## 2016-09-27 MED ORDER — LOSARTAN POTASSIUM 100 MG PO TABS: ORAL_TABLET | ORAL | 0 refills | Status: DC

## 2016-09-27 NOTE — Telephone Encounter (Signed)
Script sent to pharmacy.

## 2016-09-29 ENCOUNTER — Encounter: Payer: Self-pay | Admitting: Family Medicine

## 2016-09-29 ENCOUNTER — Ambulatory Visit
Admission: RE | Admit: 2016-09-29 | Discharge: 2016-09-29 | Disposition: A | Discharge status: Home / Self Care | Payer: Medicare Other | Source: Ambulatory Visit | Attending: Family Medicine | Admitting: Family Medicine

## 2016-09-29 ENCOUNTER — Ambulatory Visit (INDEPENDENT_AMBULATORY_CARE_PROVIDER_SITE_OTHER): Payer: Medicare Other | Admitting: Family Medicine

## 2016-09-29 DIAGNOSIS — Z96641 Presence of right artificial hip joint: Secondary | ICD-10-CM | POA: Insufficient documentation

## 2016-09-29 DIAGNOSIS — I700 Atherosclerosis of aorta: Secondary | ICD-10-CM | POA: Diagnosis not present

## 2016-09-29 DIAGNOSIS — M545 Low back pain, unspecified: Secondary | ICD-10-CM

## 2016-09-29 DIAGNOSIS — I10 Essential (primary) hypertension: Secondary | ICD-10-CM

## 2016-09-29 DIAGNOSIS — M4856XA Collapsed vertebra, not elsewhere classified, lumbar region, initial encounter for fracture: Secondary | ICD-10-CM | POA: Diagnosis not present

## 2016-09-29 DIAGNOSIS — E785 Hyperlipidemia, unspecified: Secondary | ICD-10-CM

## 2016-09-29 DIAGNOSIS — M8580 Other specified disorders of bone density and structure, unspecified site: Secondary | ICD-10-CM | POA: Diagnosis not present

## 2016-09-29 MED ORDER — TIZANIDINE HCL 2 MG PO TABS: 2.0000 mg | ORAL_TABLET | Freq: Three times a day (TID) | ORAL | 0 refills | Status: AC | PRN

## 2016-09-29 MED ORDER — LOVASTATIN 40 MG PO TABS: 40.0000 mg | ORAL_TABLET | Freq: Every day | ORAL | 0 refills | Status: DC

## 2016-09-29 MED ORDER — METOPROLOL TARTRATE 50 MG PO TABS: ORAL_TABLET | ORAL | 1 refills | Status: DC

## 2016-09-29 NOTE — Progress Notes (Signed)
Name: Jodi Bullock   MRN: [id number]    DOB: 01/17/1925   Date:09/29/2016       Progress Note  Subjective  Chief Complaint  Chief Complaint  Patient presents with  . Follow-up    3 mo    Back Pain  This is a new problem. The current episode started in the past 7 days (5 days ago, was trying to help pull her husband to sitting position, felt a sharp pain in her back ). The pain is present in the lumbar spine and sacro-iliac. The quality of the pain is described as aching. The pain is at a severity of 5/10. The pain is moderate. The symptoms are aggravated by position. Pertinent negatives include no bladder incontinence, bowel incontinence, chest pain, headaches, leg pain or pelvic pain. She has tried analgesics (has been taking Tramadol and Tylenol) for the symptoms.  Hyperlipidemia  This is a chronic problem. The problem is controlled. Recent lipid tests were reviewed and are normal. Pertinent negatives include no chest pain, leg pain, myalgias or shortness of breath. Current antihyperlipidemic treatment includes statins.  Hypertension  This is a chronic problem. The problem is unchanged. The problem is uncontrolled (has been under a lot of stress caring for her husband). Pertinent negatives include no blurred vision, chest pain, headaches, palpitations or shortness of breath. Past treatments include beta blockers and angiotensin blockers. There is no history of kidney disease, CAD/MI or CVA.    Past Medical History:  Diagnosis Date  . Arthritis   . Breast cancer ([place])   . Cancer ([place]) 1998   breast  . Depression   . GERD (gastroesophageal reflux disease)   . History of kidney stones   . HLD (hyperlipidemia)   . Hypertension   . Hypertension   . Hypoplasia, thymus gland    cancer  . Neuropathy of both feet   . Systolic CHF with reduced left ventricular function, NYHA class 3 ([place]) 01/10/2016    Past Surgical History:  Procedure Laterality Date  . ABDOMINAL HYSTERECTOMY    .  BREAST LUMPECTOMY    . CATARACT EXTRACTION    . CHOLECYSTECTOMY    . JOINT REPLACEMENT Right    Hip  . KNEE SURGERY Right     Family History  Problem Relation Age of Onset  . Stroke Mother   . Diabetes Father   . Heart disease Father   . Kidney disease Neg Hx   . Bladder Cancer Neg Hx     Social History   Social History  . Marital status: Married    Spouse name: N/A  . Number of children: N/A  . Years of education: N/A   Occupational History  . Not on file.   Social History Main Topics  . Smoking status: Former Research scientist (life sciences)  . Smokeless tobacco: Never Used     Comment: smoked in high school for a few years only  . Alcohol use No  . Drug use: No  . Sexual activity: Not Currently   Other Topics Concern  . Not on file   Social History Narrative  . No narrative on file     Current Outpatient Prescriptions:  .  Cholecalciferol 2000 UNITS TABS, Take 1 tablet by mouth daily., Disp: , Rfl:  .  fluticasone (FLONASE) 50 MCG/ACT nasal spray, Place 2 sprays into both nostrils at bedtime., Disp: , Rfl:  .  fluticasone furoate-vilanterol (BREO ELLIPTA) 100-25 MCG/INH AEPB, Inhale 1 puff into the lungs daily., Disp: 60 each, Rfl: 5 .  furosemide (LASIX) 20 MG tablet, Take 1 tablet (20 mg total) by mouth daily., Disp: 90 tablet, Rfl: 3 .  LORazepam (ATIVAN) 0.5 MG tablet, Take 1 tablet (0.5 mg total) by mouth 2 (two) times daily as needed for anxiety., Disp: 60 tablet, Rfl: 2 .  losartan (COZAAR) 100 MG tablet, TAKE 1 TABLET(100 MG) BY MOUTH DAILY, Disp: 90 tablet, Rfl: 0 .  lovastatin (MEVACOR) 40 MG tablet, Take 1 tablet (40 mg total) by mouth daily., Disp: 90 tablet, Rfl: 0 .  metoprolol (LOPRESSOR) 50 MG tablet, TAKE 1 TABLET(50 MG) BY MOUTH TWICE DAILY, Disp: 180 tablet, Rfl: 1 .  Multiple Vitamins-Minerals (PRESERVISION/LUTEIN) CAPS, Take 1 each by mouth 2 (two) times daily., Disp: , Rfl:  .  Polyethylene Glycol 1450 LIQD, POLYETHYLENE GLYCOL (Powder) - Historical Medication  17  grams daily Active, Disp: , Rfl:  .  traMADol (ULTRAM) 50 MG tablet, Take 1 tablet (50 mg total) by mouth 2 (two) times daily., Disp: 60 tablet, Rfl: 0  Allergies  Allergen Reactions  . 2,4-D Dimethylamine (Amisol) Other (See Comments)    Other Reaction: OTHER REACTION-IRRITATION TO Esophagous  . Celebrex [Celecoxib] Other (See Comments)     esophagitis Esophageal spasms    . Ciprofloxacin Other (See Comments)    Esophageal irritation  . Ibuprofen     Other reaction(s): Other (See Comments) reflux  . Metronidazole Other (See Comments)    neuropathy  . Naproxen Other (See Comments)    GI UPSET  . Other Other (See Comments)    Other Reaction: esophagitis     Review of Systems  Eyes: Negative for blurred vision.  Respiratory: Negative for shortness of breath.   Cardiovascular: Negative for chest pain and palpitations.  Gastrointestinal: Negative for bowel incontinence.  Genitourinary: Negative for bladder incontinence and pelvic pain.  Musculoskeletal: Positive for back pain. Negative for myalgias.  Neurological: Negative for headaches.     Objective  Vitals:   09/29/16 1034  BP: (!) 142/76  Pulse: 82  Resp: 16  Temp: 97.3 F (36.3 C)  TempSrc: Oral  SpO2: 94%  Weight: 162 lb 3.2 oz (73.6 kg)  Height: 5\' 6"  (1.676 m)    Physical Exam  Constitutional: She is oriented to person, place, and time and well-developed, well-nourished, and in no distress.  HENT:  Head: Normocephalic and atraumatic.  Musculoskeletal:       Lumbar back: She exhibits tenderness, pain and spasm.       Back:  Neurological: She is alert and oriented to person, place, and time.  Nursing note and vitals reviewed.     Assessment & Plan  1. Acute bilateral low back pain without sciatica Likely from muscle spasm, start on low-dose muscle relaxant, obtain x-rays of lumbar spine - tiZANidine (ZANAFLEX) 2 MG tablet; Take 1 tablet (2 mg total) by mouth every 8 (eight) hours as needed for  muscle spasms.  Dispense: 21 tablet; Refill: 0 - DG Lumbar Spine Complete; Future  2. Dyslipidemia  - lovastatin (MEVACOR) 40 MG tablet; Take 1 tablet (40 mg total) by mouth daily.  Dispense: 90 tablet; Refill: 0 - Lipid panel  3. Essential hypertension Elevated above goal, likely because of anxiety and acute low back pain, reassess in one month - metoprolol tartrate (LOPRESSOR) 50 MG tablet; TAKE 1 TABLET(50 MG) BY MOUTH TWICE DAILY  Dispense: 180 tablet; Refill: 1   Shabree Tebbetts [facility]ad A[facility] 09/29/2016 10:46 AM

## 2016-10-10 ENCOUNTER — Telehealth: Payer: Self-pay | Admitting: Family Medicine

## 2016-10-10 DIAGNOSIS — M25561 Pain in right knee: Principal | ICD-10-CM

## 2016-10-10 DIAGNOSIS — G8929 Other chronic pain: Secondary | ICD-10-CM

## 2016-10-10 DIAGNOSIS — M25562 Pain in left knee: Principal | ICD-10-CM

## 2016-10-10 NOTE — Telephone Encounter (Signed)
Pt needs refill on Tramodol. Please advise. Pt needs this by Friday.

## 2016-10-11 LAB — LIPID PANEL
CHOLESTEROL: 147 mg/dL (ref <200)
HDL: 48 mg/dL — ABNORMAL LOW (ref >50)
LDL Cholesterol: 70 mg/dL (ref <100)
Total CHOL/HDL Ratio: 3.1 Ratio (ref <5.0)
Triglycerides: 147 mg/dL (ref <150)
VLDL: 29 mg/dL (ref <30)

## 2016-10-11 MED ORDER — TRAMADOL HCL 50 MG PO TABS: 50.0000 mg | ORAL_TABLET | Freq: Two times a day (BID) | ORAL | 0 refills | Status: DC

## 2016-10-11 NOTE — Telephone Encounter (Signed)
Prescription for tramadol is printed and is ready for pickup

## 2016-10-12 ENCOUNTER — Institutional Professional Consult (permissible substitution): Payer: Medicare Other | Admitting: Pulmonary Disease

## 2016-10-12 ENCOUNTER — Encounter: Payer: Self-pay | Admitting: Pulmonary Disease

## 2016-10-12 ENCOUNTER — Ambulatory Visit (INDEPENDENT_AMBULATORY_CARE_PROVIDER_SITE_OTHER): Payer: Medicare Other | Admitting: Pulmonary Disease

## 2016-10-12 VITALS — BP 128/78 | HR 76 | Ht 66.0 in | Wt 158.0 lb

## 2016-10-12 DIAGNOSIS — J449 Chronic obstructive pulmonary disease, unspecified: Secondary | ICD-10-CM | POA: Diagnosis not present

## 2016-10-12 DIAGNOSIS — J329 Chronic sinusitis, unspecified: Secondary | ICD-10-CM | POA: Diagnosis not present

## 2016-10-12 DIAGNOSIS — R059 Cough, unspecified: Secondary | ICD-10-CM

## 2016-10-12 DIAGNOSIS — R05 Cough: Secondary | ICD-10-CM | POA: Diagnosis not present

## 2016-10-12 MED ORDER — FLUTICASONE PROPIONATE 50 MCG/ACT NA SUSP: 2.0000 | Freq: Every day | NASAL | 5 refills | Status: DC

## 2016-10-12 NOTE — Telephone Encounter (Signed)
Pt.notified

## 2016-10-12 NOTE — Patient Instructions (Signed)
Continue Breo inhaler. Make sure you venture mouth out thoroughly after use of this medication  Resume Flonase inhaler, 2 sprays per nostril each day  Follow-up in 2-3 months

## 2016-10-16 NOTE — Progress Notes (Signed)
PULMONARY CONSULT NOTE  Requesting MD/Service: Manuella Ghazi, MD Date of initial consultation: 09/25/16 Reason for consultation: Cough  PT PROFILE: 83 y.o. female never smoker referred for evaluation and mgmt of chronic cough. Wheezing noted on exam. Initial plan: Continue PPI and nasal steroid. Add Breo inhaler  SUBJ:  Cough improved with addition of Breo inhaler but last couple of days has developed posterior nasal drainage. Now with a "different cough" which is not very troublling. Otherwise no new complaints. Believes Breo inhaler has been beneficial. She is not using Flonase nasal inhaler   Vitals:   10/12/16 1206  BP: 128/78  Pulse: 76  SpO2: 98%  Weight: 158 lb (71.7 kg)  Height: 5\' 6"  (1.676 m)     EXAM:  Gen: Appears younger than her true age, No overt respiratory distress HEENT: NCAT, sclera white, oropharynx normal, mild to mod rhinitis Neck: Supple without LAN, thyromegaly, JVD Lungs: No wheezes or other adventitious sounds Cardiovascular: RRR, no murmurs noted Abdomen: Soft, nontender, normal BS Ext: without clubbing, cyanosis, edema. Bilateral varicosities Neuro: CNs grossly intact, motor and sensory intact Skin: Limited exam, no lesions noted  DATA:   BMP Latest Ref Rng & Units 05/17/2016 01/23/2016 09/22/2015  Glucose 65 - 99 mg/dL 104(H) 147(H) 104(H)  BUN 7 - 25 mg/dL 15 21(H) 14  Creatinine 0.60 - 0.88 mg/dL 0.76 0.82 0.76  BUN/Creat Ratio 12 - 28 - - 18  Sodium 135 - 146 mmol/L 142 141 143  Potassium 3.5 - 5.3 mmol/L 5.3 4.2 4.3  Chloride 98 - 110 mmol/L 105 108 103  CO2 20 - 31 mmol/L 30 24 25   Calcium 8.6 - 10.4 mg/dL 9.2 8.9 9.1    CBC Latest Ref Rng & Units 01/23/2016 08/31/2015 06/21/2015  WBC 3.6 - 11.0 K/uL 7.4 7.4 7.0  Hemoglobin 12.0 - 16.0 g/dL 14.0 - 13.5  Hematocrit 35.0 - 47.0 % 40.1 41.9 41.4  Platelets 150 - 440 K/uL 236 179 157    CXR:  06/19/16 chronic, postsurgical changes in the right lower lobe. No acute disease  IMPRESSION:   1)  chronic cough - multifactorial 2) Chronic asthmatic bronchitis 3) chronic rhinitis.   PLAN:  Continue Breo inhaler 100-25. Rinse mouth after use Resume Flonase  Follow-up in 2-3 months.    Merton Border, MD PCCM service Mobile [phone] Pager [phone] 10/16/2016 10:38 PM

## 2016-10-25 ENCOUNTER — Encounter: Payer: Self-pay | Admitting: Family Medicine

## 2016-10-25 ENCOUNTER — Ambulatory Visit (INDEPENDENT_AMBULATORY_CARE_PROVIDER_SITE_OTHER): Payer: Medicare Other | Admitting: Family Medicine

## 2016-10-25 VITALS — BP 128/64 | HR 69 | Temp 97.6°F | Resp 18 | Ht 66.0 in | Wt 163.6 lb

## 2016-10-25 DIAGNOSIS — N3946 Mixed incontinence: Secondary | ICD-10-CM | POA: Diagnosis not present

## 2016-10-25 NOTE — Progress Notes (Signed)
Name: Jodi Bullock   MRN: [id number]    DOB: [birth year]   Date:10/25/2016       Progress Note  Subjective  Chief Complaint  Chief Complaint  Patient presents with  . Referral    patient needs a referral to Urology for urinary frequency & a [facility] referral for medical supplies (depends)    HPI  Patient presents for obtaining a referral to Urology for her slowly worsening urinary incontinence and frequency. She has symptoms of urinary urgency (has to get to the bathroom quickly), symptoms mostly at night but can be during the daytime. She can sense her bladder is full and has to get to the bathroom but last night woke up and her depends was soaking wet. She has cut down on caffeine and has seen no improvement. She has seen Urology in the past and received a 'stimulation treatment' 2 years ago.  She has discontinued taking Lasix due to the worsening urinary incontinence. In addition, she is also requesting a [facility] referral to help her with obtaining incontinence supplies.     Past Medical History:  Diagnosis Date  . Arthritis   . Breast cancer ([place])   . Cancer ([place]) 1998   breast  . Depression   . GERD (gastroesophageal reflux disease)   . History of kidney stones   . HLD (hyperlipidemia)   . Hypertension   . Hypertension   . Hypoplasia, thymus gland    cancer  . Neuropathy of both feet   . Systolic CHF with reduced left ventricular function, NYHA class 3 ([place]) 01/10/2016    Past Surgical History:  Procedure Laterality Date  . ABDOMINAL HYSTERECTOMY    . BREAST LUMPECTOMY    . CATARACT EXTRACTION    . CHOLECYSTECTOMY    . JOINT REPLACEMENT Right    Hip  . KNEE SURGERY Right     Family History  Problem Relation Age of Onset  . Stroke Mother   . Diabetes Father   . Heart disease Father   . Kidney disease Neg Hx   . Bladder Cancer Neg Hx     Social History   Social History  . Marital status: Married    Spouse name: N/A  . Number of children: N/A  . Years of  education: N/A   Occupational History  . Not on file.   Social History Main Topics  . Smoking status: Former Research scientist (life sciences)  . Smokeless tobacco: Never Used     Comment: smoked in high school for a few years only  . Alcohol use No  . Drug use: No  . Sexual activity: Not Currently   Other Topics Concern  . Not on file   Social History Narrative  . No narrative on file     Current Outpatient Prescriptions:  .  Cholecalciferol 2000 UNITS TABS, Take 1 tablet by mouth daily., Disp: , Rfl:  .  fluticasone (FLONASE) 50 MCG/ACT nasal spray, Place 2 sprays into both nostrils at bedtime., Disp: 16 g, Rfl: 5 .  fluticasone furoate-vilanterol (BREO ELLIPTA) 100-25 MCG/INH AEPB, Inhale 1 puff into the lungs daily., Disp: 60 each, Rfl: 5 .  furosemide (LASIX) 20 MG tablet, Take 1 tablet (20 mg total) by mouth daily., Disp: 90 tablet, Rfl: 3 .  LORazepam (ATIVAN) 0.5 MG tablet, Take 1 tablet (0.5 mg total) by mouth 2 (two) times daily as needed for anxiety., Disp: 60 tablet, Rfl: 2 .  losartan (COZAAR) 100 MG tablet, TAKE 1 TABLET(100 MG) BY MOUTH DAILY, Disp:  90 tablet, Rfl: 0 .  lovastatin (MEVACOR) 40 MG tablet, Take 1 tablet (40 mg total) by mouth daily., Disp: 90 tablet, Rfl: 0 .  metoprolol tartrate (LOPRESSOR) 50 MG tablet, TAKE 1 TABLET(50 MG) BY MOUTH TWICE DAILY, Disp: 180 tablet, Rfl: 1 .  Multiple Vitamins-Minerals (PRESERVISION/LUTEIN) CAPS, Take 1 each by mouth 2 (two) times daily., Disp: , Rfl:  .  Polyethylene Glycol 1450 LIQD, POLYETHYLENE GLYCOL (Powder) - Historical Medication  17 grams daily Active, Disp: , Rfl:  .  predniSONE (STERAPRED UNI-PAK 21 TAB) 10 MG (21) TBPK tablet, TK UTD, Disp: , Rfl: 0 .  traMADol (ULTRAM) 50 MG tablet, Take 1 tablet (50 mg total) by mouth 2 (two) times daily., Disp: 60 tablet, Rfl: 0  Allergies  Allergen Reactions  . 2,4-D Dimethylamine (Amisol) Other (See Comments)    Other Reaction: OTHER REACTION-IRRITATION TO Esophagous  . Celebrex [Celecoxib]  Other (See Comments)     esophagitis Esophageal spasms    . Ciprofloxacin Other (See Comments)    Esophageal irritation  . Ibuprofen     Other reaction(s): Other (See Comments) reflux  . Metronidazole Other (See Comments)    neuropathy  . Naproxen Other (See Comments)    GI UPSET  . Other Other (See Comments)    Other Reaction: esophagitis     ROS  Please see HPI for ROS  Objective  Vitals:   10/25/16 0909  BP: 128/64  Pulse: 69  Resp: 18  Temp: 97.6 F (36.4 C)  TempSrc: Oral  SpO2: 95%  Weight: 163 lb 9.6 oz (74.2 kg)  Height: 5\' 6"  (1.676 m)    Physical Exam  Constitutional: She is oriented to person, place, and time. Vital signs are normal.  Cardiovascular: Normal rate, regular rhythm, S1 normal and S2 normal.   No murmur heard. Pulmonary/Chest: Effort normal and breath sounds normal. No respiratory distress. She has no wheezes. She has no rhonchi.  Abdominal: Soft. Bowel sounds are normal. There is no tenderness.  Neurological: She is oriented to person, place, and time.  Psychiatric: Memory, affect and judgment normal. Her mood appears anxious.  Nursing note and vitals reviewed.      Assessment & Plan  1. Mixed stress and urge urinary incontinence By history and exam, will refer to urology and [facility] for assistance in obtaining incontinence supplies - Ambulatory referral to Urology - AMB Referral to [place] Management   Demetric Dunnaway Asad A[facility] 10/25/2016 9:39 AM

## 2016-10-27 ENCOUNTER — Other Ambulatory Visit: Payer: Self-pay | Admitting: *Deleted

## 2016-10-27 NOTE — Patient Outreach (Signed)
[facility]) Care Management  10/27/2016  Jodi Bullock [birth year] [phone]  Referral via MD office to assist with incontinent supplies:  Telephone to patient; left HIPPA compliant  message on voice mail requesting call back.  Will follow up.  Sherrin Daisy, RN BSN [place] Management Coordinator [facility] Care Management  [phone]

## 2016-10-31 ENCOUNTER — Ambulatory Visit (INDEPENDENT_AMBULATORY_CARE_PROVIDER_SITE_OTHER): Payer: Medicare Other | Admitting: Nurse Practitioner

## 2016-10-31 ENCOUNTER — Encounter: Payer: Self-pay | Admitting: Nurse Practitioner

## 2016-10-31 VITALS — BP 150/64 | HR 69 | Ht 66.0 in | Wt 164.5 lb

## 2016-10-31 DIAGNOSIS — I428 Other cardiomyopathies: Secondary | ICD-10-CM

## 2016-10-31 DIAGNOSIS — I10 Essential (primary) hypertension: Secondary | ICD-10-CM

## 2016-10-31 DIAGNOSIS — I5022 Chronic systolic (congestive) heart failure: Secondary | ICD-10-CM

## 2016-10-31 MED ORDER — FUROSEMIDE 20 MG PO TABS: 20.0000 mg | ORAL_TABLET | Freq: Every day | ORAL | 3 refills | Status: DC | PRN

## 2016-10-31 NOTE — Patient Instructions (Signed)
Medication Instructions:  Please continue your current medications  Labwork: None  Testing/Procedures: None  Follow-Up: Your physician wants you to follow-up in: 6 months w/ Dr. Fletcher Anon.  You will receive a reminder letter in the mail two months in advance.  If you don't receive a letter, please call our office to schedule the follow-up appointment.  If you need a refill on your cardiac medications before your next appointment, please call your pharmacy.

## 2016-10-31 NOTE — Progress Notes (Signed)
Office Visit    Patient Name: Jodi Bullock Date of Encounter: 10/31/2016  Primary Care Provider:  Roselee Nova, MD Primary Cardiologist:  Jerilynn Mages. Fletcher Anon, MD   Chief Complaint    83 year old female with history of nonischemiccardiomyopathy, HFrEF, hypertension, hyperlipidemia, thymus cancer, and remote breast cancer, who presents for follow-up.  Past Medical History    Past Medical History:  Diagnosis Date  . Arthritis   . Bladder incontinence   . Breast cancer ([place]) 1998  . Chronic systolic CHF (congestive heart failure) ([place]) 01/10/2016   a. 09/2015 Echo: EF 40-45%, possible inf HK, mod PAH (PASP 45mmHg).  . Depression   . GERD (gastroesophageal reflux disease)   . History of kidney stones   . HLD (hyperlipidemia)   . Hypertension   . Neuropathy of both feet   . NICM (nonischemic cardiomyopathy) ([place])    a. 08/2015 Lexiscan MV: EF 39%, no ischemia; b. 09/2015 Echo: EF 40-45%.  . Right Humerus head fracture    a. 07/2016 - managed conservatively.  . Thymus cancer [facility])    cancer   Past Surgical History:  Procedure Laterality Date  . ABDOMINAL HYSTERECTOMY    . BREAST LUMPECTOMY    . CATARACT EXTRACTION    . CHOLECYSTECTOMY    . JOINT REPLACEMENT Right    Hip  . KNEE SURGERY Right     Allergies  Allergies  Allergen Reactions  . 2,4-D Dimethylamine (Amisol) Other (See Comments)    Other Reaction: OTHER REACTION-IRRITATION TO Esophagous  . Celebrex [Celecoxib] Other (See Comments)     esophagitis Esophageal spasms    . Ciprofloxacin Other (See Comments)    Esophageal irritation  . Ibuprofen     Other reaction(s): Other (See Comments) reflux  . Metronidazole Other (See Comments)    neuropathy  . Naproxen Other (See Comments)    GI UPSET  . Other Other (See Comments)    Other Reaction: esophagitis    History of Present Illness    83 year old female with the above complex past medical history including hypertension, hyperlipidemia, GERD, nonischemic  cardiomyopathy, chronic systolic congestive heart failure, thymus cancer, and remote breast cancer. Cardiac history dates back to April 2017, when she was evaluated with exertional dyspnea. Stress testing showed an EF of 39% without ischemia. Follow-up echocardiography showed an EF of 40-45% with possible inferior hypokinesis. She has been medically managed with beta blocker and ARB therapy. She has also used Lasix but more recently, in the setting of progressive bladder incontinence, she is not tolerating Lasix due to frequent urination, and she has not been taking it. Despite not taking Lasix, her weight has been pretty steady at home between 159 and 161 pounds. She prefers only take it as needed and we discussed this today. She has not been having any significant dyspnea on exertion, palpitations, chest pain, PND, orthopnea, dizziness, syncope, edema, or early satiety. Due to bladder incontinence, she does have follow-up with urology tomorrow.  Home Medications    Prior to Admission medications   Medication Sig Start Date End Date Taking? Authorizing Provider  Cholecalciferol 2000 UNITS TABS Take 1 tablet by mouth daily.   Yes Keith Rake Asad A, MD  fluticasone (FLONASE) 50 MCG/ACT nasal spray Place 2 sprays into both nostrils at bedtime. 10/12/16  Yes Wilhelmina Mcardle, MD  fluticasone furoate-vilanterol (BREO ELLIPTA) 100-25 MCG/INH AEPB Inhale 1 puff into the lungs daily. 09/25/16  Yes Wilhelmina Mcardle, MD  LORazepam (ATIVAN) 0.5 MG tablet Take 1  tablet (0.5 mg total) by mouth 2 (two) times daily as needed for anxiety. 08/16/16  Yes Rochel Brome A, MD  losartan (COZAAR) 100 MG tablet TAKE 1 TABLET(100 MG) BY MOUTH DAILY 09/27/16  Yes Keith Rake Asad A, MD  lovastatin (MEVACOR) 40 MG tablet Take 1 tablet (40 mg total) by mouth daily. 09/29/16  Yes Rochel Brome A, MD  metoprolol tartrate (LOPRESSOR) 50 MG tablet TAKE 1 TABLET(50 MG) BY MOUTH TWICE DAILY 09/29/16  Yes Roselee Nova, MD  Multiple  Vitamins-Minerals (PRESERVISION/LUTEIN) CAPS Take 1 each by mouth 2 (two) times daily.   Yes [provider]  Polyethylene Glycol 1450 LIQD POLYETHYLENE GLYCOL (Powder) - Historical Medication  17 grams daily Active   Yes Keith Rake Asad A, MD  traMADol (ULTRAM) 50 MG tablet Take 1 tablet (50 mg total) by mouth 2 (two) times daily. 10/11/16  Yes Roselee Nova, MD  furosemide (LASIX) 20 MG tablet Take 1 tablet (20 mg total) by mouth daily as needed (wt gain). 10/31/16 01/29/17  Rogelia Mire, NP    Review of Systems    Most bothersome at this point is bladder incontinence. She denies chest pain, palpitations, PND, orthopnea, dizziness, syncope, edema, or early satiety..  All other systems reviewed and are otherwise negative except as noted above.  Physical Exam    VS:  BP (!) 150/64 (BP Location: Left Arm, Patient Position: Sitting, Cuff Size: Normal)   Pulse 69   Ht 5\' 6"  (1.676 m)   Wt 164 lb 8 oz (74.6 kg)   BMI 26.55 kg/m  , BMI Body mass index is 26.55 kg/m. GEN: Well nourished, well developed, in no acute distress.  HEENT: normal.  Neck: Supple, no JVD, carotid bruits, or masses. Cardiac: RRR, no murmurs, rubs, or gallops. No clubbing, cyanosis, edema.  Radials/DP/PT 2+ and equal bilaterally.  Respiratory:  Respirations regular and unlabored, clear to auscultation bilaterally. GI: Soft, nontender, nondistended, BS + x 4. MS: no deformity or atrophy. Skin: warm and dry, no rash. Neuro:  Strength and sensation are intact. Psych: Normal affect.  Accessory Clinical Findings    ECG -  Regular sinus rhythm, first-degree AV block, 69, inferior infarct, no acute ST or T changes  Assessment & Plan    1.  Chronic systolic congestive heart failure/nonischemic cardiomyopathy: Patient has been doing well and her volume has been stable at home. She weighs herself daily and weights typically trend 159-161. On our scale, she is 164 today but she was 161 on her scale this  morning. In the setting of progressive bladder incontinence, she has not been taking Lasix. I advised that as long as her weights are stable, she does not necessarily need to take Lasix. She remains on beta blocker and ARB therapy.  We discussed the importance of daily weights, sodium restriction, medication compliance, and symptom reporting and she verbalizes understanding. As it appears her dry weight is somewhere around 160 pounds on her home scale, I advised that if her weight hits 163 to 164 pounds, that would be a day that she should take Lasix. She will contact us if her need for Lasix increases beyond just a when necessary basis, at which point we would like to follow-up labs.    2. Essential hypertension: Blood pressure is elevated today. She says at home it is typically in the 120s. I recommended that she continue to follow her blood pressure at least once a day at home and contact  us if pressures are consistently running in the 130s or above, at which point we could consider adjusting her medications.  3. Disposition: Patient will follow-up with Dr. Fletcher Anon in 6 months or sooner if necessary. Murray Hodgkins, NP 10/31/2016, 4:20 PM

## 2016-10-31 NOTE — Progress Notes (Signed)
11/01/2016 2:19 PM   Jodi Bullock Apr 16, 1925 [id number]  Referring provider: Roselee Nova, MD [address] [place], [place] [address]  Chief Complaint  Patient presents with  . Urinary Incontinence    Patient referred already is a patient with Jodi Bullock    HPI: Patient is a 83 -year-old Caucasian female who is referred to Jodi Bullock by, Dr. Manuella Ghazi for urinary incontinence.  She was last seen in our office in 08/2015.    Patient states that she has had urinary incontinence for several years.  She has been tried on anticholinergics and Myrbetriq, but she could not tolerate the side effects.  She also completed a 12 weekly series of PTNS and some maintenance PTNS.   She feels that the PTNS may have been helpful.    Patient has incontinence with urgency.   She is experiencing incontinent episodes during the day.  Her incontinence volume is large, soaking through her clothes.   She is wearing 2 to 3 thick pads/depends daily.  She has recently experienced night time incontinence.    The patient has been experiencing urgency x 4-7, frequency x 4-7, not restricting fluids to avoid visits to the restroom, is engaging in toilet mapping, incontinence x 8 or more and nocturia x 0-3.      She does not have a history of urinary tract infections, STI's or injury to the bladder.   She denies dysuria, gross hematuria, suprapubic pain, back pain, abdominal pain or flank pain.  She has not had any recent fevers, chills, nausea or vomiting.   She does not have a history of nephrolithiasis, GU surgery or GU trauma.   She is not sexually active.  She is post menopausal.   She admits to constipation.  She is not having pain with bladder filling.    She has not had any recent imaging studies.    She is drinking only water daily.   Her risk factors for incontinence are age, depression, vaginal atrophy and pelvic surgery.  She is taking antihistamines, decongestants, benzo's, opioids, ACE inhibitors  and diuretics.    Serum creatinine 0.76 in 05/2016   PMH: Past Medical History:  Diagnosis Date  . Arthritis   . Bladder incontinence   . Breast cancer ([place]) 1998  . Chronic systolic CHF (congestive heart failure) ([place]) 01/10/2016   a. 09/2015 Echo: EF 40-45%, possible inf HK, mod PAH (PASP 72mmHg).  . Depression   . GERD (gastroesophageal reflux disease)   . History of kidney stones   . HLD (hyperlipidemia)   . Hypertension   . Neuropathy of both feet   . NICM (nonischemic cardiomyopathy) ([place])    a. 08/2015 Lexiscan MV: EF 39%, no ischemia; b. 09/2015 Echo: EF 40-45%.  . Right Humerus head fracture    a. 07/2016 - managed conservatively.  . Thymus cancer [facility])    cancer    Surgical History: Past Surgical History:  Procedure Laterality Date  . ABDOMINAL HYSTERECTOMY    . BREAST LUMPECTOMY    . CATARACT EXTRACTION    . CHOLECYSTECTOMY    . JOINT REPLACEMENT Right    Hip  . KNEE SURGERY Right     Home Medications:  Allergies as of 11/01/2016      Reactions   2,4-d Dimethylamine (amisol) Other (See Comments)   Other Reaction: OTHER REACTION-IRRITATION TO Esophagous   Celebrex [celecoxib] Other (See Comments)    esophagitis Esophageal spasms     Ciprofloxacin Other (See Comments)  Esophageal irritation   Ibuprofen    Other reaction(s): Other (See Comments) reflux   Metronidazole Other (See Comments)   neuropathy   Naproxen Other (See Comments)   GI UPSET   Other Other (See Comments)   Other Reaction: esophagitis      Medication List       Accurate as of 11/01/16  2:19 PM. Always use your most recent med list.          Cholecalciferol 2000 units Tabs Take 1 tablet by mouth daily.   fluticasone 50 MCG/ACT nasal spray Commonly known as:  FLONASE Place 2 sprays into both nostrils at bedtime.   fluticasone furoate-vilanterol 100-25 MCG/INH Aepb Commonly known as:  BREO ELLIPTA Inhale 1 puff into the lungs daily.   furosemide 20 MG tablet Commonly  known as:  LASIX Take 1 tablet (20 mg total) by mouth daily as needed (wt gain).   LORazepam 0.5 MG tablet Commonly known as:  ATIVAN Take 1 tablet (0.5 mg total) by mouth 2 (two) times daily as needed for anxiety.   losartan 100 MG tablet Commonly known as:  COZAAR TAKE 1 TABLET(100 MG) BY MOUTH DAILY   lovastatin 40 MG tablet Commonly known as:  MEVACOR Take 1 tablet (40 mg total) by mouth daily.   metoprolol tartrate 50 MG tablet Commonly known as:  LOPRESSOR TAKE 1 TABLET(50 MG) BY MOUTH TWICE DAILY   Polyethylene Glycol 1450 Liqd POLYETHYLENE GLYCOL (Powder) - Historical Medication  17 grams daily Active   PRESERVISION/LUTEIN Caps Take 1 each by mouth 2 (two) times daily.   traMADol 50 MG tablet Commonly known as:  ULTRAM Take 1 tablet (50 mg total) by mouth 2 (two) times daily.       Allergies:  Allergies  Allergen Reactions  . 2,4-D Dimethylamine (Amisol) Other (See Comments)    Other Reaction: OTHER REACTION-IRRITATION TO Esophagous  . Celebrex [Celecoxib] Other (See Comments)     esophagitis Esophageal spasms    . Ciprofloxacin Other (See Comments)    Esophageal irritation  . Ibuprofen     Other reaction(s): Other (See Comments) reflux  . Metronidazole Other (See Comments)    neuropathy  . Naproxen Other (See Comments)    GI UPSET  . Other Other (See Comments)    Other Reaction: esophagitis    Family History: Family History  Problem Relation Age of Onset  . Stroke Mother   . Diabetes Father   . Heart disease Father   . Kidney disease Neg Hx   . Bladder Cancer Neg Hx   . Kidney cancer Neg Hx     Social History:  reports that she has quit smoking. She has never used smokeless tobacco. She reports that she does not drink alcohol or use drugs.  ROS: UROLOGY Frequent Urination?: Yes Hard to postpone urination?: Yes Burning/pain with urination?: No Get up at night to urinate?: Yes Leakage of urine?: Yes Urine stream starts and stops?:  No Trouble starting stream?: No Do you have to strain to urinate?: No Blood in urine?: No Urinary tract infection?: No Sexually transmitted disease?: No Injury to kidneys or bladder?: No Painful intercourse?: No Weak stream?: No Currently pregnant?: No Vaginal bleeding?: No Last menstrual period?: n  Gastrointestinal Nausea?: No Vomiting?: No Indigestion/heartburn?: No Diarrhea?: No Constipation?: Yes  Constitutional Fever: No Night sweats?: No Weight loss?: No Fatigue?: No  Skin Skin rash/lesions?: No Itching?: No  Eyes Blurred vision?: No Double vision?: No  Ears/Nose/Throat Sore throat?: No Sinus problems?: Yes  Hematologic/Lymphatic Swollen glands?: No Easy bruising?: No  Cardiovascular Leg swelling?: No Chest pain?: No  Respiratory Cough?: Yes Shortness of breath?: No  Endocrine Excessive thirst?: No  Musculoskeletal Back pain?: Yes Joint pain?: Yes  Neurological Headaches?: No Dizziness?: No  Psychologic Depression?: No Anxiety?: Yes  Physical Exam: BP 118/61   Pulse 76   Ht 5\' 6"  (1.676 m)   Wt 163 lb 14.4 oz (74.3 kg)   BMI 26.45 kg/m   Constitutional: Well nourished. Alert and oriented, No acute distress. HEENT: [place] AT, moist mucus membranes. Trachea midline, no masses. Cardiovascular: No clubbing, cyanosis, or edema. Respiratory: Normal respiratory effort, no increased work of breathing. GI: Abdomen is soft, non tender, non distended, no abdominal masses. Liver and spleen not palpable.  No hernias appreciated.  Stool sample for occult testing is not indicated.   GU: No CVA tenderness.  No bladder fullness or masses.  Atrophic external genitalia, normal pubic hair distribution, no lesions.  Normal urethral meatus, no lesions, no prolapse, no discharge.   No urethral masses, tenderness and/or tenderness. No bladder fullness, tenderness or masses. Pale vagina mucosa, poor estrogen effect, no discharge, no lesions, good pelvic  support, Grade I cystocele.  No rectocele noted.  Cervix and uterus are surgically absent.  No adnexal/parametria masses or tenderness noted.  Anus and perineum are without rashes or lesions.    Skin: No rashes, bruises or suspicious lesions. Lymph: No cervical or inguinal adenopathy. Neurologic: Grossly intact, no focal deficits, moving all 4 extremities. Psychiatric: Normal mood and affect.  Laboratory Data: Lab Results  Component Value Date   WBC 7.4 01/23/2016   HGB 14.0 01/23/2016   HCT 40.1 01/23/2016   MCV 89.7 01/23/2016   PLT 236 01/23/2016    Lab Results  Component Value Date   CREATININE 0.76 05/17/2016    Lab Results  Component Value Date   HGBA1C 5.4 02/14/2016    Lab Results  Component Value Date   TSH 1.050 08/31/2015       Component Value Date/Time   CHOL 147 10/11/2016 0804   CHOL 131 06/04/2015 0810   HDL 48 (L) 10/11/2016 0804   HDL 54 06/04/2015 0810   CHOLHDL 3.1 10/11/2016 0804   VLDL 29 10/11/2016 0804   LDLCALC 70 10/11/2016 0804   LDLCALC 53 06/04/2015 0810    Lab Results  Component Value Date   AST 15 05/17/2016   Lab Results  Component Value Date   ALT 10 05/17/2016     Assessment & Plan:   1. Urge Incontinence  - discussed behavioral therapies, bladder training, bladder control strategies  - failed medical therapy with anticholinergic therapy and beta-3 adrenergic receptor agonist   - would like to retry PTNS as she found it effective in the past  2. Vaginal atrophy  - Patient has history of breast cancer and reluctant to start any estrogen therapy at this time   Return for schedule PTNS.  These notes generated with voice recognition software. I apologize for typographical errors.  Zara Council, [facility] [address], [place],  [address] [phone]

## 2016-11-01 ENCOUNTER — Encounter: Payer: Self-pay | Admitting: Urology

## 2016-11-01 ENCOUNTER — Ambulatory Visit (INDEPENDENT_AMBULATORY_CARE_PROVIDER_SITE_OTHER): Payer: Medicare Other | Admitting: Urology

## 2016-11-01 ENCOUNTER — Other Ambulatory Visit: Payer: Self-pay | Admitting: *Deleted

## 2016-11-01 VITALS — BP 118/61 | HR 76 | Ht 66.0 in | Wt 163.9 lb

## 2016-11-01 DIAGNOSIS — N952 Postmenopausal atrophic vaginitis: Secondary | ICD-10-CM | POA: Diagnosis not present

## 2016-11-01 DIAGNOSIS — N3941 Urge incontinence: Secondary | ICD-10-CM | POA: Diagnosis not present

## 2016-11-01 NOTE — Patient Outreach (Signed)
[place] [facility]) Care Management  11/01/2016  Cathleen Yagi 1925/02/25 [id number]  Telephone Screen  Referral Date: 10/25/16 Referral Source: MD office Keith Rake) Referral Reason: To assist with obtaining incontinence supplies Insurance: Medicare, [place]  Outreach attempt #2 to patient.  No answer. RN CM left HIPAA compliant message along with contact info.   Plan: RN CM will contact patient within the next 4 business days.   Lake Bells, RN, BSN, MHA/MSL, [place] Telephonic Care Manager Coordinator [facility] Direct Phone: [phone] Toll Free: [phone] Fax: [phone]

## 2016-11-06 ENCOUNTER — Other Ambulatory Visit: Payer: Self-pay

## 2016-11-06 DIAGNOSIS — F419 Anxiety disorder, unspecified: Secondary | ICD-10-CM

## 2016-11-06 MED ORDER — LORAZEPAM 0.5 MG PO TABS: 0.5000 mg | ORAL_TABLET | Freq: Two times a day (BID) | ORAL | 2 refills | Status: DC | PRN

## 2016-11-06 NOTE — Progress Notes (Signed)
This encounter was created in error - please disregard.

## 2016-11-07 ENCOUNTER — Encounter: Payer: Self-pay | Admitting: *Deleted

## 2016-11-07 ENCOUNTER — Other Ambulatory Visit: Payer: Self-pay | Admitting: *Deleted

## 2016-11-07 ENCOUNTER — Ambulatory Visit: Payer: Self-pay | Admitting: *Deleted

## 2016-11-07 NOTE — Patient Outreach (Signed)
[place] [facility]) Care Management  11/07/2016  Daneya Hartgrove 1924-08-06 [id number]  Telephone Screen  Referral Date: 10/25/16 Referral Source: MD office Keith Rake) Referral Reason: To assist with obtaining incontinence supplies Insurance: Medicare, [employer]  Patient reported, she has a planned procedure (PTNS) scheduled for 11/08/16. She reported, she is "tired of being wet". She has to change her incontinent pads 4 to 5 times per night. Her incontinent pads are very expensive. She is spending too much money on supplies. Patient requested for information about [facility] services to be mailed to her. She wants to delay/ starting services until she determines if the PTNS procedure will be beneficial. She will contact [facility], if she requires services in the future.  Plan: RN CM will send patient information about [facility] services with contact information. RN CM will notify [facility] CM administrative assistant regarding case closure.   Lake Bells, RN, BSN, MHA/MSL, [place] Telephonic Care Manager Coordinator [facility] Direct Phone: [phone] Toll Free: [phone] Fax: [phone]

## 2016-11-08 ENCOUNTER — Encounter: Payer: Medicare Other | Admitting: Urology

## 2016-11-13 NOTE — Progress Notes (Deleted)
Chief Complaint: No chief complaint on file.    HPI: 83 yo WF who presents today to begin PTNS therapy.  This will be # 1/12 weekly treatments.  Background history    Patient states that she has had urinary incontinence for several years.  She has been tried on anticholinergics and Myrbetriq, but she could not tolerate the side effects.  She also completed a 12 weekly series of PTNS and some maintenance PTNS.   She feels that the PTNS may have been helpful.  Patient has incontinence with urgency.   She is experiencing incontinent episodes during the day.  Her incontinence volume is large, soaking through her clothes.   She is wearing 2 to 3 thick pads/depends daily.  She has recently experienced night time incontinence.  The patient has been experiencing urgency x 4-7, frequency x 4-7, not restricting fluids to avoid visits to the restroom, is engaging in toilet mapping, incontinence x 8 or more and nocturia x 0-3.   She does not have a history of urinary tract infections, STI's or injury to the bladder.   She denies dysuria, gross hematuria, suprapubic pain, back pain, abdominal pain or flank pain.  She has not had any recent fevers, chills, nausea or vomiting.  She does not have a history of nephrolithiasis, GU surgery or GU trauma.   She is not sexually active.  She is post menopausal.  She admits to constipation.  She is not having pain with bladder filling.    She has not had any recent imaging studies.   She is drinking only water daily.   Her risk factors for incontinence are age, depression, vaginal atrophy and pelvic surgery.  She is taking antihistamines, decongestants, benzo's, opioids, ACE inhibitors and diuretics.   Serum creatinine 0.76 in 05/2016   Previous Therapy:     She has failed Myrbetriq and anticholinergics   Contraindications present for PTNS      Pacemaker - NO       Implantable defibrillator - NO      History of abnormal bleeding - NO      History of neuropathies or  nerve damage - NO  Discussed with patient possible complications of procedure, such as discomfort, bleeding at insertion/stimulation site, procedure consent signed  Patient goals:     ***  Reemphasized that most patient's will see benefit by the 8th week of treatment, but about 10 to 20% of patient may be late responders.  If they happen to be late responders, it is important to continue with the therapy beyond the 12 weekly treatments as many of those patient find benefit.   PMH: Past Medical History:  Diagnosis Date  . Arthritis   . Bladder incontinence   . Breast cancer ([place]) 1998  . Chronic systolic CHF (congestive heart failure) ([place]) 01/10/2016   a. 09/2015 Echo: EF 40-45%, possible inf HK, mod PAH (PASP 4mmHg).  . Depression   . GERD (gastroesophageal reflux disease)   . History of kidney stones   . HLD (hyperlipidemia)   . Hypertension   . Neuropathy of both feet   . NICM (nonischemic cardiomyopathy) ([place])    a. 08/2015 Lexiscan MV: EF 39%, no ischemia; b. 09/2015 Echo: EF 40-45%.  . Right Humerus head fracture    a. 07/2016 - managed conservatively.  . 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 ABDOMINAL HYSTERECTOMY    . BREAST LUMPECTOMY    .  CATARACT EXTRACTION    . CHOLECYSTECTOMY    . JOINT REPLACEMENT Right    Hip  . KNEE SURGERY Right     Home Medications:  Allergies as of 11/14/2016      Reactions   2,4-d Dimethylamine (amisol) Other (See Comments)   Other Reaction: OTHER REACTION-IRRITATION TO Esophagous   Celebrex [celecoxib] Other (See Comments)    esophagitis Esophageal spasms     Ciprofloxacin Other (See Comments)   Esophageal irritation   Ibuprofen    Other reaction(s): Other (See Comments) reflux   Metronidazole Other (See Comments)   neuropathy   Naproxen Other (See Comments)   GI UPSET   Other Other (See Comments)   Other Reaction: esophagitis      Medication List         Accurate as of 11/13/16  9:17 PM. Always use your most recent med list.          Cholecalciferol 2000 units Tabs Take 1 tablet by mouth daily.   fluticasone 50 MCG/ACT nasal spray Commonly known as:  FLONASE Place 2 sprays into both nostrils at bedtime.   fluticasone furoate-vilanterol 100-25 MCG/INH Aepb Commonly known as:  BREO ELLIPTA Inhale 1 puff into the lungs daily.   LORazepam 0.5 MG tablet Commonly known as:  ATIVAN Take 1 tablet (0.5 mg total) by mouth 2 (two) times daily as needed for anxiety.   losartan 100 MG tablet Commonly known as:  COZAAR TAKE 1 TABLET(100 MG) BY MOUTH DAILY   lovastatin 40 MG tablet Commonly known as:  MEVACOR Take 1 tablet (40 mg total) by mouth daily.   metoprolol tartrate 50 MG tablet Commonly known as:  LOPRESSOR TAKE 1 TABLET(50 MG) BY MOUTH TWICE DAILY   Polyethylene Glycol 1450 Liqd POLYETHYLENE GLYCOL (Powder) - Historical Medication  17 grams daily Active   PRESERVISION/LUTEIN Caps Take 1 each by mouth 2 (two) times daily.   traMADol 50 MG tablet Commonly known as:  ULTRAM Take 1 tablet (50 mg total) by mouth 2 (two) times daily.       Allergies:  Allergies  Allergen Reactions  . 2,4-D Dimethylamine (Amisol) Other (See Comments)    Other Reaction: OTHER REACTION-IRRITATION TO Esophagous  . Celebrex [Celecoxib] Other (See Comments)     esophagitis Esophageal spasms    . Ciprofloxacin Other (See Comments)    Esophageal irritation  . Ibuprofen     Other reaction(s): Other (See Comments) reflux  . Metronidazole Other (See Comments)    neuropathy  . Naproxen Other (See Comments)    GI UPSET  . Other Other (See Comments)    Other Reaction: esophagitis    Family History: Family History  Problem Relation Age of Onset  . Stroke Mother   . Diabetes Father   . Heart disease Father   . Kidney disease Neg Hx   . Bladder Cancer Neg Hx   . Kidney cancer Neg Hx     Social History:  reports that she has quit  smoking. She has never used smokeless tobacco. She reports that she does not drink alcohol or use drugs.  ROS:                                         Physical Exam: There were no vitals taken for this visit.  Constitutional: Well nourished. Alert and oriented, No acute distress. HEENT: [place] AT, moist mucus membranes. Trachea  midline, no masses. Cardiovascular: No clubbing, cyanosis, or edema. Respiratory: Normal respiratory effort, no increased work of breathing. Skin: No rashes, bruises or suspicious lesions. Lymph: No cervical or inguinal adenopathy. Neurologic: Grossly intact, no focal deficits, moving all 4 extremities. Psychiatric: Normal mood and affect.  Laboratory Data: Lab Results  Component Value Date   WBC 7.4 01/23/2016   HGB 14.0 01/23/2016   HCT 40.1 01/23/2016   MCV 89.7 01/23/2016   PLT 236 01/23/2016    Lab Results  Component Value Date   CREATININE 0.76 05/17/2016    Lab Results  Component Value Date   HGBA1C 5.4 02/14/2016    Lab Results  Component Value Date   TSH 1.050 08/31/2015       Component Value Date/Time   CHOL 147 10/11/2016 0804   CHOL 131 06/04/2015 0810   HDL 48 (L) 10/11/2016 0804   HDL 54 06/04/2015 0810   CHOLHDL 3.1 10/11/2016 0804   VLDL 29 10/11/2016 0804   LDLCALC 70 10/11/2016 0804   LDLCALC 53 06/04/2015 0810    Lab Results  Component Value Date   AST 15 05/17/2016   Lab Results  Component Value Date   ALT 10 05/17/2016   I have reviewed the labs.    PTNS treatment: The needle electrode was inserted into the lower, inner aspect of the patient's *** leg. The surface electrode was placed on the inside arch of the foot on the treatment leg. The lead set was connected to the stimulator and the needle electrode clip was connected to the needle electrode. The stimulator that produces an adjustable electrical pulse that travels to the sacral nerve plexus via the tibial nerve was increased to  *** and tell the patient received a ***.     Assessment & Plan:  ***    Treatment Plan:  The needle electrode was removed without difficulty to the patient.  Patient tolerated the procedure for 30 minutes.  She will return next week for # *** out of 12 of their weekly PTNS treatment's    No Follow-up on file.  These notes generated with voice recognition software. I apologize for typographical errors.  Zara Council, [facility] [address] [place], [place] [address] [phone]

## 2016-11-14 ENCOUNTER — Ambulatory Visit: Payer: Medicare Other | Admitting: Urology

## 2016-11-21 ENCOUNTER — Telehealth: Payer: Self-pay | Admitting: Family Medicine

## 2016-11-21 DIAGNOSIS — E785 Hyperlipidemia, unspecified: Secondary | ICD-10-CM

## 2016-11-21 NOTE — Telephone Encounter (Signed)
Pt requesting refill on lovastatin 40 mg. Please send to [facility] [address]

## 2016-11-22 ENCOUNTER — Encounter: Payer: Self-pay | Admitting: Urology

## 2016-11-22 ENCOUNTER — Ambulatory Visit (INDEPENDENT_AMBULATORY_CARE_PROVIDER_SITE_OTHER): Payer: Medicare Other | Admitting: Urology

## 2016-11-22 VITALS — BP 148/69 | HR 66 | Ht 66.0 in | Wt 165.5 lb

## 2016-11-22 DIAGNOSIS — N3941 Urge incontinence: Secondary | ICD-10-CM

## 2016-11-22 MED ORDER — LOVASTATIN 40 MG PO TABS: 40.0000 mg | ORAL_TABLET | Freq: Every day | ORAL | 0 refills | Status: DC

## 2016-11-22 NOTE — Progress Notes (Signed)
11/22/2016 10:44 AM   Jodi Bullock [birth year] [id number]  Referring provider: Roselee Nova, MD [address] [place], [place] [address]  Chief Complaint  Patient presents with  . PTNS    Urge Incontinence    HPI: Patient is a 83 -year-old Caucasian female who is referred to [name] by, Dr. Manuella Bullock for urinary incontinence.  Patient presents for PTNS 1 of 12.    Background History: She was last seen in our office in 08/2015.    Patient states that she has had urinary incontinence for several years.  She has been tried on anticholinergics and Myrbetriq, but she could not tolerate the side effects.  She also completed a 12 weekly series of PTNS and some maintenance PTNS.   She feels that the PTNS may have been helpful.    Patient has incontinence with urgency.   She is experiencing incontinent episodes during the day.  Her incontinence volume is large, soaking through her clothes.   She is wearing 2 to 3 thick pads/depends daily.  She has recently experienced night time incontinence.    The patient has been experiencing urgency x 4-7, frequency x 4-7, not restricting fluids to avoid visits to the restroom, is engaging in toilet mapping, incontinence x 8 or more and nocturia x 0-3.      She does not have a history of urinary tract infections, STI's or injury to the bladder.   She denies dysuria, gross hematuria, suprapubic pain, back pain, abdominal pain or flank pain.  She has not had any recent fevers, chills, nausea or vomiting.   She does not have a history of nephrolithiasis, GU surgery or GU trauma.   She is not sexually active.  She is post menopausal.   She admits to constipation.  She is not having pain with bladder filling.    She has not had any recent imaging studies.    She is drinking only water daily.   Her risk factors for incontinence are age, depression, vaginal atrophy and pelvic surgery.  She is taking antihistamines, decongestants, benzo's,  opioids, ACE inhibitors and diuretics.    Serum creatinine 0.76 in 05/2016     PMH: Past Medical History:  Diagnosis Date  . Arthritis   . Bladder incontinence   . Breast cancer ([place]) 1998  . Chronic systolic CHF (congestive heart failure) ([place]) 01/10/2016   a. 09/2015 Echo: EF 40-45%, possible inf HK, mod PAH (PASP 63mmHg).  . Depression   . GERD (gastroesophageal reflux disease)   . History of kidney stones   . HLD (hyperlipidemia)   . Hypertension   . Neuropathy of both feet   . NICM (nonischemic cardiomyopathy) ([place])    a. 08/2015 Lexiscan MV: EF 39%, no ischemia; b. 09/2015 Echo: EF 40-45%.  . Right Humerus head fracture    a. 07/2016 - managed conservatively.  . 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 ABDOMINAL HYSTERECTOMY    . BREAST LUMPECTOMY    . CATARACT EXTRACTION    . CHOLECYSTECTOMY    . JOINT REPLACEMENT Right    Hip  . KNEE SURGERY Right     Home Medications:  Allergies as of 11/22/2016      Reactions   2,4-d Dimethylamine (amisol) Other (See Comments)   Other Reaction: OTHER REACTION-IRRITATION TO Esophagous   Celebrex [celecoxib] Other (See Comments)    esophagitis Esophageal spasms  Ciprofloxacin Other (See Comments)   Esophageal irritation   Ibuprofen    Other reaction(s): Other (See Comments) reflux   Metronidazole Other (See Comments)   neuropathy   Naproxen Other (See Comments)   GI UPSET   Other Other (See Comments)   Other Reaction: esophagitis      Medication List       Accurate as of 11/22/16 10:44 AM. Always use your most recent med list.          Cholecalciferol 2000 units Tabs Take 1 tablet by mouth daily.   fluticasone 50 MCG/ACT nasal spray Commonly known as:  FLONASE Place 2 sprays into both nostrils at bedtime.   fluticasone furoate-vilanterol 100-25 MCG/INH Aepb Commonly known as:  BREO ELLIPTA Inhale 1 puff into the lungs daily.   LORazepam  0.5 MG tablet Commonly known as:  ATIVAN Take 1 tablet (0.5 mg total) by mouth 2 (two) times daily as needed for anxiety.   losartan 100 MG tablet Commonly known as:  COZAAR TAKE 1 TABLET(100 MG) BY MOUTH DAILY   lovastatin 40 MG tablet Commonly known as:  MEVACOR Take 1 tablet (40 mg total) by mouth daily.   metoprolol tartrate 50 MG tablet Commonly known as:  LOPRESSOR TAKE 1 TABLET(50 MG) BY MOUTH TWICE DAILY   Polyethylene Glycol 1450 Liqd POLYETHYLENE GLYCOL (Powder) - Historical Medication  17 grams daily Active   PRESERVISION/LUTEIN Caps Take 1 each by mouth 2 (two) times daily.   traMADol 50 MG tablet Commonly known as:  ULTRAM Take 1 tablet (50 mg total) by mouth 2 (two) times daily.       Allergies:  Allergies  Allergen Reactions  . 2,4-D Dimethylamine (Amisol) Other (See Comments)    Other Reaction: OTHER REACTION-IRRITATION TO Esophagous  . Celebrex [Celecoxib] Other (See Comments)     esophagitis Esophageal spasms    . Ciprofloxacin Other (See Comments)    Esophageal irritation  . Ibuprofen     Other reaction(s): Other (See Comments) reflux  . Metronidazole Other (See Comments)    neuropathy  . Naproxen Other (See Comments)    GI UPSET  . Other Other (See Comments)    Other Reaction: esophagitis    Family History: Family History  Problem Relation Age of Onset  . Stroke Mother   . Diabetes Father   . Heart disease Father   . Kidney disease Neg Hx   . Bladder Cancer Neg Hx   . Kidney cancer Neg Hx     Social History:  reports that she has quit smoking. She has never used smokeless tobacco. She reports that she does not drink alcohol or use drugs.  ROS: UROLOGY Frequent Urination?: Yes Hard to postpone urination?: Yes Burning/pain with urination?: No Get up at night to urinate?: Yes Leakage of urine?: Yes Urine stream starts and stops?: No Trouble starting stream?: No Do you have to strain to urinate?: No Blood in urine?: No Urinary  tract infection?: No Sexually transmitted disease?: No Injury to kidneys or bladder?: No Painful intercourse?: No Weak stream?: No Currently pregnant?: No Vaginal bleeding?: No Last menstrual period?: n  Gastrointestinal Nausea?: No Vomiting?: No Indigestion/heartburn?: No Diarrhea?: No Constipation?: No  Constitutional Fever: No Night sweats?: No Weight loss?: No Fatigue?: No  Skin Skin rash/lesions?: No Itching?: No  Eyes Blurred vision?: Yes Double vision?: No  Ears/Nose/Throat Sore throat?: No Sinus problems?: Yes  Hematologic/Lymphatic Swollen glands?: No Easy bruising?: No  Cardiovascular Leg swelling?: No Chest pain?: No  Respiratory Cough?:  No Shortness of breath?: No  Endocrine Excessive thirst?: No  Musculoskeletal Back pain?: Yes Joint pain?: Yes  Neurological Headaches?: No Dizziness?: No  Psychologic Depression?: No Anxiety?: No  Physical Exam: BP (!) 148/69   Pulse 66   Ht 5\' 6"  (1.676 m)   Wt 165 lb 8 oz (75.1 kg)   BMI 26.71 kg/m   Constitutional:  Alert and oriented, No acute distress. HEENT: [place] AT, moist mucus membranes.  Trachea midline, no masses. Cardiovascular: No clubbing, cyanosis, or edema. Respiratory: Normal respiratory effort, no increased work of breathing. GI: Abdomen is soft, nontender, nondistended, no abdominal masses GU: No CVA tenderness.  Skin: No rashes, bruises or suspicious lesions. Lymph: No cervical or inguinal adenopathy. Neurologic: Grossly intact, no focal deficits, moving all 4 extremities. Psychiatric: Normal mood and affect.  Laboratory Data: Lab Results  Component Value Date   WBC 7.4 01/23/2016   HGB 14.0 01/23/2016   HCT 40.1 01/23/2016   MCV 89.7 01/23/2016   PLT 236 01/23/2016    Lab Results  Component Value Date   CREATININE 0.76 05/17/2016    No results found for: PSA  No results found for: TESTOSTERONE  Lab Results  Component Value Date   HGBA1C 5.4 02/14/2016     Urinalysis    Component Value Date/Time   COLORURINE STRAW (A) 01/24/2016 0005   APPEARANCEUR HAZY (A) 01/24/2016 0005   APPEARANCEUR Clear 01/25/2015 0854   LABSPEC 1.006 01/24/2016 0005   PHURINE 8.0 01/24/2016 0005   GLUCOSEU NEGATIVE 01/24/2016 0005   HGBUR NEGATIVE 01/24/2016 0005   BILIRUBINUR NEGATIVE 01/24/2016 0005   BILIRUBINUR Negative 01/25/2015 0854   KETONESUR NEGATIVE 01/24/2016 0005   PROTEINUR NEGATIVE 01/24/2016 0005   NITRITE NEGATIVE 01/24/2016 0005   LEUKOCYTESUR NEGATIVE 01/24/2016 0005   LEUKOCYTESUR Trace (A) 01/25/2015 0854    PTNS treatment: The needle electrode was inserted to the lower, and aspect of the patient's left leg. The surface electrode was placed on the inside arch of the foot on the treatment leg. The lead set was connected to the stimulator, and the needle electrode clip was connected to the needle electrode. The stimulator that produces an adjustable electrical pulsatile and sacral nerve plexus via the tibial nerve was increased to a 14 until the patient received both a toe flex response.  Assessment & Plan:    1. Urge incontinence -follow up as scheduled for next [place], [place] [address] [place], [place] [address] [phone]

## 2016-11-22 NOTE — Telephone Encounter (Signed)
Medication has been refilled and sent to [facility] [address] 

## 2016-11-22 NOTE — Progress Notes (Signed)
PTNS  Session # 1  Second Round  Health & Social Factors: First Procedure Caffeine: 0 Alcohol: 0 Daytime voids #per day: Patient states don't  know and did not attempt to write anything on bladder diary or bring it with her for the last week Night-time voids #per night: 3-4 Urgency: Mild Incontinence Episodes #per day: most all are accidents leak all the time Ankle used: Left Treatment Setting: 14 Feeling/ Response: Sensory  Preformed By: Jodene Nam MD  Assistant: Lyndee Hensen CMA  Follow Up: One week

## 2016-11-28 NOTE — Progress Notes (Signed)
Chief Complaint:  Chief Complaint  Patient presents with  . PTNS    Urge Incontinence     HPI: 83 yo WF who presents today to begin PTNS therapy.  This will be # 2/12 weekly treatments.  Background history    Patient states that she has had urinary incontinence for several years.  She has been tried on anticholinergics and Myrbetriq, but she could not tolerate the side effects.  She also completed a 12 weekly series of PTNS and some maintenance PTNS.   She feels that the PTNS may have been helpful.  Patient has incontinence with urgency.   She is experiencing incontinent episodes during the day.  Her incontinence volume is large, soaking through her clothes.   She is wearing 2 to 3 thick pads/depends daily.  She has recently experienced night time incontinence.  The patient has been experiencing urgency x 4-7, frequency x 4-7, not restricting fluids to avoid visits to the restroom, is engaging in toilet mapping, incontinence x 8 or more and nocturia x 0-3.   She does not have a history of urinary tract infections, STI's or injury to the bladder.   She denies dysuria, gross hematuria, suprapubic pain, back pain, abdominal pain or flank pain.  She has not had any recent fevers, chills, nausea or vomiting.  She does not have a history of nephrolithiasis, GU surgery or GU trauma.   She is not sexually active.  She is post menopausal.  She admits to constipation.  She is not having pain with bladder filling.    She has not had any recent imaging studies.   She is drinking only water daily.   Her risk factors for incontinence are age, depression, vaginal atrophy and pelvic surgery.  She is taking antihistamines, decongestants, benzo's, opioids, ACE inhibitors and diuretics.   Serum creatinine 0.76 in 05/2016   Previous Therapy:     She has failed Myrbetriq and anticholinergics   Contraindications present for PTNS      Pacemaker - NO       Implantable defibrillator - NO      History of abnormal  bleeding - NO      History of neuropathies or nerve damage - NO  Discussed with patient possible complications of procedure, such as discomfort, bleeding at insertion/stimulation site, procedure consent signed  Patient goals:     To decrease trips to the bathroom.    Reemphasized that most patient's will see benefit by the 8th week of treatment, but about 10 to 20% of patient may be late responders.  If they happen to be late responders, it is important to continue with the therapy beyond the 12 weekly treatments as many of those patient find benefit.  After her first treatment, she is experiencing 8 day time voids, 4 night time voids, mild urgency and no incontinence.  She denies fevers, chills, nausea and vomiting.  She denies dysuria, gross hematuria and suprapubic pain.    PMH: Past Medical History:  Diagnosis Date  . Arthritis   . Bladder incontinence   . Breast cancer ([place]) 1998  . Chronic systolic CHF (congestive heart failure) ([place]) 01/10/2016   a. 09/2015 Echo: EF 40-45%, possible inf HK, mod PAH (PASP 8mmHg).  . Depression   . GERD (gastroesophageal reflux disease)   . History of kidney stones   . HLD (hyperlipidemia)   . Hypertension   . Neuropathy of both feet   . NICM (nonischemic cardiomyopathy) ([place])    a.  08/2015 Lexiscan MV: EF 39%, no ischemia; b. 09/2015 Echo: EF 40-45%.  . Right Humerus head fracture    a. 07/2016 - managed conservatively.  . 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 ABDOMINAL HYSTERECTOMY    . BREAST LUMPECTOMY    . CATARACT EXTRACTION    . CHOLECYSTECTOMY    . JOINT REPLACEMENT Right    Hip  . KNEE SURGERY Right     Home Medications:  Allergies as of 11/29/2016      Reactions   2,4-d Dimethylamine (amisol) Other (See Comments)   Other Reaction: OTHER REACTION-IRRITATION TO Esophagous   Celebrex [celecoxib] Other (See Comments)    esophagitis Esophageal spasms      Ciprofloxacin Other (See Comments)   Esophageal irritation   Ibuprofen    Other reaction(s): Other (See Comments) reflux   Metronidazole Other (See Comments)   neuropathy   Naproxen Other (See Comments)   GI UPSET   Other Other (See Comments)   Other Reaction: esophagitis      Medication List       Accurate as of 11/29/16  1:24 PM. Always use your most recent med list.          Cholecalciferol 2000 units Tabs Take 1 tablet by mouth daily.   fluticasone 50 MCG/ACT nasal spray Commonly known as:  FLONASE Place 2 sprays into both nostrils at bedtime.   fluticasone furoate-vilanterol 100-25 MCG/INH Aepb Commonly known as:  BREO ELLIPTA Inhale 1 puff into the lungs daily.   LORazepam 0.5 MG tablet Commonly known as:  ATIVAN Take 1 tablet (0.5 mg total) by mouth 2 (two) times daily as needed for anxiety.   losartan 100 MG tablet Commonly known as:  COZAAR TAKE 1 TABLET(100 MG) BY MOUTH DAILY   lovastatin 40 MG tablet Commonly known as:  MEVACOR Take 1 tablet (40 mg total) by mouth daily.   metoprolol tartrate 50 MG tablet Commonly known as:  LOPRESSOR TAKE 1 TABLET(50 MG) BY MOUTH TWICE DAILY   Polyethylene Glycol 1450 Liqd POLYETHYLENE GLYCOL (Powder) - Historical Medication  17 grams daily Active   PRESERVISION/LUTEIN Caps Take 1 each by mouth 2 (two) times daily.   traMADol 50 MG tablet Commonly known as:  ULTRAM Take 1 tablet (50 mg total) by mouth 2 (two) times daily.       Allergies:  Allergies  Allergen Reactions  . 2,4-D Dimethylamine (Amisol) Other (See Comments)    Other Reaction: OTHER REACTION-IRRITATION TO Esophagous  . Celebrex [Celecoxib] Other (See Comments)     esophagitis Esophageal spasms    . Ciprofloxacin Other (See Comments)    Esophageal irritation  . Ibuprofen     Other reaction(s): Other (See Comments) reflux  . Metronidazole Other (See Comments)    neuropathy  . Naproxen Other (See Comments)    GI UPSET  . Other Other  (See Comments)    Other Reaction: esophagitis    Family History: Family History  Problem Relation Age of Onset  . Stroke Mother   . Diabetes Father   . Heart disease Father   . Kidney disease Neg Hx   . Bladder Cancer Neg Hx   . Kidney cancer Neg Hx     Social History:  reports that she has quit smoking. She has never used smokeless tobacco. She reports that she does not drink alcohol or use drugs.  ROS: UROLOGY Frequent Urination?: No Hard to postpone  urination?: No Burning/pain with urination?: No Get up at night to urinate?: Yes Leakage of urine?: Yes Urine stream starts and stops?: No Trouble starting stream?: No Do you have to strain to urinate?: No Blood in urine?: No Urinary tract infection?: No Sexually transmitted disease?: No Injury to kidneys or bladder?: No Painful intercourse?: No Weak stream?: No Currently pregnant?: No Vaginal bleeding?: No Last menstrual period?: n  Gastrointestinal Nausea?: No Vomiting?: No Indigestion/heartburn?: No Diarrhea?: No Constipation?: No  Constitutional Fever: No Night sweats?: No Weight loss?: No Fatigue?: No  Skin Skin rash/lesions?: No Itching?: No  Eyes Blurred vision?: Yes Double vision?: No  Ears/Nose/Throat Sore throat?: No Sinus problems?: Yes  Hematologic/Lymphatic Swollen glands?: No Easy bruising?: Yes  Cardiovascular Leg swelling?: No Chest pain?: No  Respiratory Cough?: Yes Shortness of breath?: No  Endocrine Excessive thirst?: No  Musculoskeletal Back pain?: Yes Joint pain?: Yes  Neurological Headaches?: No Dizziness?: No  Psychologic Depression?: No Anxiety?: No   Physical Exam: BP 132/60   Pulse 66   Ht 5\' 6"  (1.676 m)   Wt 165 lb 14.4 oz (75.3 kg)   BMI 26.78 kg/m   Constitutional: Well nourished. Alert and oriented, No acute distress. HEENT: [place] AT, moist mucus membranes. Trachea midline, no masses. Cardiovascular: No clubbing, cyanosis, or  edema. Respiratory: Normal respiratory effort, no increased work of breathing. Skin: No rashes, bruises or suspicious lesions. Lymph: No cervical or inguinal adenopathy. Neurologic: Grossly intact, no focal deficits, moving all 4 extremities. Psychiatric: Normal mood and affect.  Laboratory Data: Lab Results  Component Value Date   WBC 7.4 01/23/2016   HGB 14.0 01/23/2016   HCT 40.1 01/23/2016   MCV 89.7 01/23/2016   PLT 236 01/23/2016    Lab Results  Component Value Date   CREATININE 0.76 05/17/2016    Lab Results  Component Value Date   HGBA1C 5.4 02/14/2016       Component Value Date/Time   CHOL 147 10/11/2016 0804   CHOL 131 06/04/2015 0810   HDL 48 (L) 10/11/2016 0804   HDL 54 06/04/2015 0810   CHOLHDL 3.1 10/11/2016 0804   VLDL 29 10/11/2016 0804   LDLCALC 70 10/11/2016 0804   LDLCALC 53 06/04/2015 0810    Lab Results  Component Value Date   AST 15 05/17/2016   Lab Results  Component Value Date   ALT 10 05/17/2016   I have reviewed the labs.    PTNS treatment: The needle electrode was inserted into the lower, inner aspect of the patient's left leg. The surface electrode was placed on the inside arch of the foot on the treatment leg. The lead set was connected to the stimulator and the needle electrode clip was connected to the needle electrode. The stimulator that produces an adjustable electrical pulse that travels to the sacral nerve plexus via the tibial nerve was increased to 8 until the patient received a sensory response.     Assessment & Plan:   1. Urge incontinence  Treatment Plan:  The needle electrode was removed without difficulty to the patient.  Patient tolerated the procedure for 30 minutes.  She will return next week for # 3 out of 12 of their weekly PTNS treatment's  Return in about 1 week (around 12/06/2016) for # 3/PTNS.  These notes generated with voice recognition software. I apologize for typographical errors.  Zara Council, [facility] [address], [place], [place] [address] [phone]

## 2016-11-29 ENCOUNTER — Encounter: Payer: Self-pay | Admitting: Urology

## 2016-11-29 ENCOUNTER — Ambulatory Visit (INDEPENDENT_AMBULATORY_CARE_PROVIDER_SITE_OTHER): Payer: Medicare Other | Admitting: Urology

## 2016-11-29 VITALS — BP 132/60 | HR 66 | Ht 66.0 in | Wt 165.9 lb

## 2016-11-29 DIAGNOSIS — N3941 Urge incontinence: Secondary | ICD-10-CM

## 2016-11-29 NOTE — Progress Notes (Signed)
PTNS  Session # 2  Health & Social Factors: No Change Caffeine: 0 Alcohol: 0 Daytime voids #per day: 8 Night-time voids #per night: 4 Urgency: Mild Incontinence Episodes #per day: 0 Ankle used: Left Treatment Setting: 8 Feeling/ Response: Sensory  Preformed By: Zara Council PA-C and PA Student   Assistant: Lyndee Hensen CMA  Follow Up: One week

## 2016-12-04 ENCOUNTER — Encounter: Payer: Self-pay | Admitting: Family Medicine

## 2016-12-04 ENCOUNTER — Ambulatory Visit (INDEPENDENT_AMBULATORY_CARE_PROVIDER_SITE_OTHER): Payer: Medicare Other | Admitting: Family Medicine

## 2016-12-04 VITALS — BP 138/76 | HR 78 | Temp 98.3°F | Resp 16 | Ht 66.0 in | Wt 165.7 lb

## 2016-12-04 DIAGNOSIS — I10 Essential (primary) hypertension: Secondary | ICD-10-CM | POA: Diagnosis not present

## 2016-12-04 DIAGNOSIS — M25561 Pain in right knee: Secondary | ICD-10-CM

## 2016-12-04 DIAGNOSIS — C37 Malignant neoplasm of thymus: Secondary | ICD-10-CM | POA: Diagnosis not present

## 2016-12-04 DIAGNOSIS — G8929 Other chronic pain: Secondary | ICD-10-CM

## 2016-12-04 DIAGNOSIS — M25562 Pain in left knee: Secondary | ICD-10-CM | POA: Diagnosis not present

## 2016-12-04 MED ORDER — TRAMADOL HCL 50 MG PO TABS: 50.0000 mg | ORAL_TABLET | Freq: Two times a day (BID) | ORAL | 0 refills | Status: DC

## 2016-12-04 NOTE — Progress Notes (Signed)
Name: Jodi Bullock   MRN: [id number]    DOB: [birth year]   Date:12/04/2016       Progress Note  Subjective  Chief Complaint  Chief Complaint  Patient presents with  . Follow-up    Hypertension  This is a chronic problem. The problem is unchanged. The problem is controlled. Pertinent negatives include no blurred vision, chest pain, headaches, palpitations or shortness of breath. Past treatments include beta blockers and angiotensin blockers. There is no history of kidney disease, CAD/MI or CVA.  Knee Pain   The pain is present in the left knee and right knee. The quality of the pain is described as aching. The pain is at a severity of 7/10. The pain is moderate. The pain has been fluctuating since onset. The symptoms are aggravated by movement (sitting makes it worse).     Past Medical History:  Diagnosis Date  . Arthritis   . Bladder incontinence   . Breast cancer ([place]) 1998  . Chronic systolic CHF (congestive heart failure) ([place]) 01/10/2016   a. 09/2015 Echo: EF 40-45%, possible inf HK, mod PAH (PASP 80mmHg).  . Depression   . GERD (gastroesophageal reflux disease)   . History of kidney stones   . HLD (hyperlipidemia)   . Hypertension   . Neuropathy of both feet   . NICM (nonischemic cardiomyopathy) ([place])    a. 08/2015 Lexiscan MV: EF 39%, no ischemia; b. 09/2015 Echo: EF 40-45%.  . Right Humerus head fracture    a. 07/2016 - managed conservatively.  . 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 ABDOMINAL HYSTERECTOMY    . BREAST LUMPECTOMY    . CATARACT EXTRACTION    . CHOLECYSTECTOMY    . JOINT REPLACEMENT Right    Hip  . KNEE SURGERY Right     Family History  Problem Relation Age of Onset  . Stroke Mother   . Diabetes Father   . Heart disease Father   . Kidney disease Neg Hx   . Bladder Cancer Neg Hx   . Kidney cancer Neg Hx     Social History   Social History  . Marital status: Married    Spouse name: N/A  .  Number of children: N/A  . Years of education: N/A   Occupational History  . Not on file.   Social History Main Topics  . Smoking status: Former Research scientist (life sciences)  . Smokeless tobacco: Never Used     Comment: smoked in high school for a few years only  . Alcohol use No  . Drug use: No  . Sexual activity: Not Currently   Other Topics Concern  . Not on file   Social History Narrative  . No narrative on file     Current Outpatient Prescriptions:  .  Cholecalciferol 2000 UNITS TABS, Take 1 tablet by mouth daily., Disp: , Rfl:  .  fluticasone (FLONASE) 50 MCG/ACT nasal spray, Place 2 sprays into both nostrils at bedtime., Disp: 16 g, Rfl: 5 .  fluticasone furoate-vilanterol (BREO ELLIPTA) 100-25 MCG/INH AEPB, Inhale 1 puff into the lungs daily., Disp: 60 each, Rfl: 5 .  LORazepam (ATIVAN) 0.5 MG tablet, Take 1 tablet (0.5 mg total) by mouth 2 (two) times daily as needed for anxiety., Disp: 60 tablet, Rfl: 2 .  losartan (COZAAR) 100 MG tablet, TAKE 1 TABLET(100 MG) BY MOUTH DAILY, Disp: 90 tablet, Rfl: 0 .  lovastatin (MEVACOR) 40 MG tablet, Take 1 tablet (  40 mg total) by mouth daily., Disp: 90 tablet, Rfl: 0 .  metoprolol tartrate (LOPRESSOR) 50 MG tablet, TAKE 1 TABLET(50 MG) BY MOUTH TWICE DAILY, Disp: 180 tablet, Rfl: 1 .  Multiple Vitamins-Minerals (PRESERVISION/LUTEIN) CAPS, Take 1 each by mouth 2 (two) times daily., Disp: , Rfl:  .  Polyethylene Glycol 1450 LIQD, POLYETHYLENE GLYCOL (Powder) - Historical Medication  17 grams daily Active, Disp: , Rfl:  .  traMADol (ULTRAM) 50 MG tablet, Take 1 tablet (50 mg total) by mouth 2 (two) times daily., Disp: 60 tablet, Rfl: 0  Allergies  Allergen Reactions  . 2,4-D Dimethylamine (Amisol) Other (See Comments)    Other Reaction: OTHER REACTION-IRRITATION TO Esophagous  . Celebrex [Celecoxib] Other (See Comments)     esophagitis Esophageal spasms    . Ciprofloxacin Other (See Comments)    Esophageal irritation  . Ibuprofen     Other  reaction(s): Other (See Comments) reflux  . Metronidazole Other (See Comments)    neuropathy  . Naproxen Other (See Comments)    GI UPSET  . Other Other (See Comments)    Other Reaction: esophagitis     Review of Systems  Eyes: Negative for blurred vision.  Respiratory: Negative for shortness of breath.   Cardiovascular: Negative for chest pain and palpitations.  Neurological: Negative for headaches.     Objective  Vitals:   12/04/16 1357  BP: 138/76  Pulse: 78  Resp: 16  Temp: 98.3 F (36.8 C)  TempSrc: Oral  SpO2: 96%  Weight: 165 lb 11.2 oz (75.2 kg)  Height: 5\' 6"  (1.676 m)    Physical Exam  Constitutional: She is oriented to person, place, and time and well-developed, well-nourished, and in no distress.  Cardiovascular: Normal rate, regular rhythm, S1 normal, S2 normal and normal heart sounds.   Pulmonary/Chest: Effort normal and breath sounds normal. She has no wheezes.  Neurological: She is alert and oriented to person, place, and time.  Psychiatric: Memory, affect and judgment normal. Her mood appears anxious.  Nursing note and vitals reviewed.        Assessment & Plan 1. Thymic carcinoma [facility]) Surgery notes from [facility], patient is being followed by cardiothoracic surgery and will obtain CT in September 2018  2. Essential hypertension Stable on present anti- hypertensive treatment  3. Bilateral chronic knee pain Stable and responsive to tramadol taken as prescribed, refills provided - traMADol (ULTRAM) 50 MG tablet; Take 1 tablet (50 mg total) by mouth 2 (two) times daily.  Dispense: 60 tablet; Refill: 0   Jodi Bullock [facility] 12/04/2016 2:23 PM

## 2016-12-05 NOTE — Progress Notes (Signed)
Chief Complaint:  Chief Complaint  Patient presents with  . PTNS    Urge Incontinence     HPI: 83 yo WF who presents today to begin PTNS therapy.  This will be # 3/12 weekly treatments.  Background history    Patient states that she has had urinary incontinence for several years.  She has been tried on anticholinergics and Myrbetriq, but she could not tolerate the side effects.  She also completed a 12 weekly series of PTNS and some maintenance PTNS.   She feels that the PTNS may have been helpful.  Patient has incontinence with urgency.   She is experiencing incontinent episodes during the day.  Her incontinence volume is large, soaking through her clothes.   She is wearing 2 to 3 thick pads/depends daily.  She has recently experienced night time incontinence.  The patient has been experiencing urgency x 4-7, frequency x 4-7, not restricting fluids to avoid visits to the restroom, is engaging in toilet mapping, incontinence x 8 or more and nocturia x 0-3.   She does not have a history of urinary tract infections, STI's or injury to the bladder.   She denies dysuria, gross hematuria, suprapubic pain, back pain, abdominal pain or flank pain.  She has not had any recent fevers, chills, nausea or vomiting.  She does not have a history of nephrolithiasis, GU surgery or GU trauma.   She is not sexually active.  She is post menopausal.  She admits to constipation.  She is not having pain with bladder filling.    She has not had any recent imaging studies.   She is drinking only water daily.   Her risk factors for incontinence are age, depression, vaginal atrophy and pelvic surgery.  She is taking antihistamines, decongestants, benzo's, opioids, ACE inhibitors and diuretics.   Serum creatinine 0.76 in 05/2016   Previous Therapy:     She has failed Myrbetriq and anticholinergics   Contraindications present for PTNS      Pacemaker - NO       Implantable defibrillator - NO      History of abnormal  bleeding - NO      History of neuropathies or nerve damage - NO  Discussed with patient possible complications of procedure, such as discomfort, bleeding at insertion/stimulation site, procedure consent signed  Patient goals:     To decrease trips to the bathroom.    Reemphasized that most patient's will see benefit by the 8th week of treatment, but about 10 to 20% of patient may be late responders.  If they happen to be late responders, it is important to continue with the therapy beyond the 12 weekly treatments as many of those patient find benefit.  After her second treatment, she is experiencing 8 day time voids, 4 night time voids, mild urgency and no incontinence.  She denies fevers, chills, nausea and vomiting.  She denies dysuria, gross hematuria and suprapubic pain.    PMH: Past Medical History:  Diagnosis Date  . Arthritis   . Bladder incontinence   . Breast cancer ([place]) 1998  . Chronic systolic CHF (congestive heart failure) ([place]) 01/10/2016   a. 09/2015 Echo: EF 40-45%, possible inf HK, mod PAH (PASP 74mmHg).  . Depression   . GERD (gastroesophageal reflux disease)   . History of kidney stones   . HLD (hyperlipidemia)   . Hypertension   . Neuropathy of both feet   . NICM (nonischemic cardiomyopathy) ([place])    a.  08/2015 Lexiscan MV: EF 39%, no ischemia; b. 09/2015 Echo: EF 40-45%.  . Right Humerus head fracture    a. 07/2016 - managed conservatively.  . 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 ABDOMINAL HYSTERECTOMY    . BREAST LUMPECTOMY    . CATARACT EXTRACTION    . CHOLECYSTECTOMY    . JOINT REPLACEMENT Right    Hip  . KNEE SURGERY Right     Home Medications:  Allergies as of 12/06/2016      Reactions   2,4-d Dimethylamine (amisol) Other (See Comments)   Other Reaction: OTHER REACTION-IRRITATION TO Esophagous   Celebrex [celecoxib] Other (See Comments)    esophagitis Esophageal spasms      Ciprofloxacin Other (See Comments)   Esophageal irritation   Ibuprofen    Other reaction(s): Other (See Comments) reflux   Metronidazole Other (See Comments)   neuropathy   Naproxen Other (See Comments)   GI UPSET   Other Other (See Comments)   Other Reaction: esophagitis      Medication List       Accurate as of 12/06/16 11:51 AM. Always use your most recent med list.          Cholecalciferol 2000 units Tabs Take 1 tablet by mouth daily.   fluticasone 50 MCG/ACT nasal spray Commonly known as:  FLONASE Place 2 sprays into both nostrils at bedtime.   fluticasone furoate-vilanterol 100-25 MCG/INH Aepb Commonly known as:  BREO ELLIPTA Inhale 1 puff into the lungs daily.   LORazepam 0.5 MG tablet Commonly known as:  ATIVAN Take 1 tablet (0.5 mg total) by mouth 2 (two) times daily as needed for anxiety.   losartan 100 MG tablet Commonly known as:  COZAAR TAKE 1 TABLET(100 MG) BY MOUTH DAILY   lovastatin 40 MG tablet Commonly known as:  MEVACOR Take 1 tablet (40 mg total) by mouth daily.   metoprolol tartrate 50 MG tablet Commonly known as:  LOPRESSOR TAKE 1 TABLET(50 MG) BY MOUTH TWICE DAILY   Polyethylene Glycol 1450 Liqd POLYETHYLENE GLYCOL (Powder) - Historical Medication  17 grams daily Active   PRESERVISION/LUTEIN Caps Take 1 each by mouth 2 (two) times daily.   traMADol 50 MG tablet Commonly known as:  ULTRAM Take 1 tablet (50 mg total) by mouth 2 (two) times daily.       Allergies:  Allergies  Allergen Reactions  . 2,4-D Dimethylamine (Amisol) Other (See Comments)    Other Reaction: OTHER REACTION-IRRITATION TO Esophagous  . Celebrex [Celecoxib] Other (See Comments)     esophagitis Esophageal spasms    . Ciprofloxacin Other (See Comments)    Esophageal irritation  . Ibuprofen     Other reaction(s): Other (See Comments) reflux  . Metronidazole Other (See Comments)    neuropathy  . Naproxen Other (See Comments)    GI UPSET  . Other Other  (See Comments)    Other Reaction: esophagitis    Family History: Family History  Problem Relation Age of Onset  . Stroke Mother   . Diabetes Father   . Heart disease Father   . Kidney disease Neg Hx   . Bladder Cancer Neg Hx   . Kidney cancer Neg Hx     Social History:  reports that she has quit smoking. She has never used smokeless tobacco. She reports that she does not drink alcohol or use drugs.  ROS: UROLOGY Frequent Urination?: No Hard to postpone urination?:  No Burning/pain with urination?: No Get up at night to urinate?: No Leakage of urine?: Yes Urine stream starts and stops?: No Trouble starting stream?: No Do you have to strain to urinate?: No Blood in urine?: No Urinary tract infection?: No Sexually transmitted disease?: No Injury to kidneys or bladder?: No Painful intercourse?: No Weak stream?: Yes Currently pregnant?: No Vaginal bleeding?: No Last menstrual period?: n  Gastrointestinal Nausea?: No Vomiting?: No Indigestion/heartburn?: No Diarrhea?: No Constipation?: No  Constitutional Fever: No Night sweats?: No Weight loss?: No Fatigue?: No  Skin Skin rash/lesions?: No Itching?: No  Eyes Blurred vision?: Yes Double vision?: No  Ears/Nose/Throat Sore throat?: No Sinus problems?: Yes  Hematologic/Lymphatic Swollen glands?: No Easy bruising?: Yes  Cardiovascular Leg swelling?: No Chest pain?: No  Respiratory Cough?: No Shortness of breath?: No  Endocrine Excessive thirst?: No  Musculoskeletal Back pain?: Yes Joint pain?: Yes  Neurological Headaches?: No Dizziness?: No  Psychologic Depression?: No Anxiety?: No   Physical Exam: BP (!) 163/67   Pulse 71   Ht 5\' 5"  (1.651 m)   Wt 167 lb 4.8 oz (75.9 kg)   BMI 27.84 kg/m   Constitutional: Well nourished. Alert and oriented, No acute distress. HEENT: [place] AT, moist mucus membranes. Trachea midline, no masses. Cardiovascular: No clubbing, cyanosis, or  edema. Respiratory: Normal respiratory effort, no increased work of breathing. Skin: No rashes, bruises or suspicious lesions. Lymph: No cervical or inguinal adenopathy. Neurologic: Grossly intact, no focal deficits, moving all 4 extremities. Psychiatric: Normal mood and affect.  Laboratory Data: Lab Results  Component Value Date   WBC 7.4 01/23/2016   HGB 14.0 01/23/2016   HCT 40.1 01/23/2016   MCV 89.7 01/23/2016   PLT 236 01/23/2016    Lab Results  Component Value Date   CREATININE 0.76 05/17/2016    Lab Results  Component Value Date   HGBA1C 5.4 02/14/2016       Component Value Date/Time   CHOL 147 10/11/2016 0804   CHOL 131 06/04/2015 0810   HDL 48 (L) 10/11/2016 0804   HDL 54 06/04/2015 0810   CHOLHDL 3.1 10/11/2016 0804   VLDL 29 10/11/2016 0804   LDLCALC 70 10/11/2016 0804   LDLCALC 53 06/04/2015 0810    Lab Results  Component Value Date   AST 15 05/17/2016   Lab Results  Component Value Date   ALT 10 05/17/2016   I have reviewed the labs.    PTNS treatment: The needle electrode was inserted into the lower, inner aspect of the patient's left leg. The surface electrode was placed on the inside arch of the foot on the treatment leg. The lead set was connected to the stimulator and the needle electrode clip was connected to the needle electrode. The stimulator that produces an adjustable electrical pulse that travels to the sacral nerve plexus via the tibial nerve was increased to 2 until the patient received a sensory response.     Assessment & Plan:   1. Urge incontinence  Treatment Plan:  The needle electrode was removed without difficulty to the patient.  Patient tolerated the procedure for 30 minutes.  She will return next week for # 4 out of 12 of their weekly PTNS treatment's  Return in about 1 week (around 12/13/2016) for # 4/12 PTNS.  These notes generated with voice recognition software. I apologize for typographical errors.  Zara Council, [facility] [address], [place], [place] [address] [phone]

## 2016-12-06 ENCOUNTER — Encounter: Payer: Self-pay | Admitting: Urology

## 2016-12-06 ENCOUNTER — Ambulatory Visit (INDEPENDENT_AMBULATORY_CARE_PROVIDER_SITE_OTHER): Payer: Medicare Other | Admitting: Urology

## 2016-12-06 VITALS — BP 163/67 | HR 71 | Ht 65.0 in | Wt 167.3 lb

## 2016-12-06 DIAGNOSIS — N3941 Urge incontinence: Secondary | ICD-10-CM | POA: Diagnosis not present

## 2016-12-06 NOTE — Progress Notes (Signed)
PTNS  Session # 3  Health & Social Factors: No Change Caffeine: 0 Alcohol: 0 Daytime voids #per day: 6 Night-time voids #per night: 3 Urgency: Mild Incontinence Episodes #per day: 0 Ankle used: Left Treatment Setting: 2 Feeling/ Response: Sensory  Preformed [id number] McGowan PA-C  Assistant: PA Student Lucy  Follow Up: One week

## 2016-12-07 ENCOUNTER — Ambulatory Visit: Payer: Medicare Other | Admitting: Family Medicine

## 2016-12-12 NOTE — Progress Notes (Signed)
Patient left without being seen.

## 2016-12-13 ENCOUNTER — Ambulatory Visit (INDEPENDENT_AMBULATORY_CARE_PROVIDER_SITE_OTHER): Payer: Medicare Other | Admitting: Urology

## 2016-12-13 ENCOUNTER — Encounter: Payer: Self-pay | Admitting: Urology

## 2016-12-13 VITALS — BP 138/67 | HR 75 | Ht 65.0 in | Wt 166.6 lb

## 2016-12-13 DIAGNOSIS — N3941 Urge incontinence: Secondary | ICD-10-CM

## 2016-12-19 ENCOUNTER — Ambulatory Visit (INDEPENDENT_AMBULATORY_CARE_PROVIDER_SITE_OTHER): Payer: Medicare Other | Admitting: Pulmonary Disease

## 2016-12-19 ENCOUNTER — Encounter: Payer: Self-pay | Admitting: Pulmonary Disease

## 2016-12-19 VITALS — BP 140/68 | HR 75 | Ht 65.0 in | Wt 166.0 lb

## 2016-12-19 DIAGNOSIS — R058 Other specified cough: Secondary | ICD-10-CM

## 2016-12-19 DIAGNOSIS — M24059 Loose body in unspecified hip: Secondary | ICD-10-CM | POA: Insufficient documentation

## 2016-12-19 DIAGNOSIS — S335XXA Sprain of ligaments of lumbar spine, initial encounter: Secondary | ICD-10-CM | POA: Insufficient documentation

## 2016-12-19 DIAGNOSIS — R053 Chronic cough: Secondary | ICD-10-CM

## 2016-12-19 DIAGNOSIS — S72043A Displaced fracture of base of neck of unspecified femur, initial encounter for closed fracture: Secondary | ICD-10-CM | POA: Insufficient documentation

## 2016-12-19 DIAGNOSIS — M2550 Pain in unspecified joint: Secondary | ICD-10-CM | POA: Insufficient documentation

## 2016-12-19 DIAGNOSIS — M25539 Pain in unspecified wrist: Secondary | ICD-10-CM | POA: Insufficient documentation

## 2016-12-19 DIAGNOSIS — R0982 Postnasal drip: Secondary | ICD-10-CM

## 2016-12-19 DIAGNOSIS — S7000XA Contusion of unspecified hip, initial encounter: Secondary | ICD-10-CM | POA: Insufficient documentation

## 2016-12-19 DIAGNOSIS — S60229A Contusion of unspecified hand, initial encounter: Secondary | ICD-10-CM | POA: Insufficient documentation

## 2016-12-19 DIAGNOSIS — R269 Unspecified abnormalities of gait and mobility: Secondary | ICD-10-CM | POA: Insufficient documentation

## 2016-12-19 DIAGNOSIS — S52609A Unspecified fracture of lower end of unspecified ulna, initial encounter for closed fracture: Secondary | ICD-10-CM

## 2016-12-19 DIAGNOSIS — H02889 Meibomian gland dysfunction of unspecified eye, unspecified eyelid: Secondary | ICD-10-CM | POA: Insufficient documentation

## 2016-12-19 DIAGNOSIS — M25519 Pain in unspecified shoulder: Secondary | ICD-10-CM | POA: Insufficient documentation

## 2016-12-19 DIAGNOSIS — M25569 Pain in unspecified knee: Secondary | ICD-10-CM | POA: Insufficient documentation

## 2016-12-19 DIAGNOSIS — M24549 Contracture, unspecified hand: Secondary | ICD-10-CM | POA: Insufficient documentation

## 2016-12-19 DIAGNOSIS — M50321 Other cervical disc degeneration at C4-C5 level: Secondary | ICD-10-CM | POA: Insufficient documentation

## 2016-12-19 DIAGNOSIS — H04123 Dry eye syndrome of bilateral lacrimal glands: Secondary | ICD-10-CM | POA: Insufficient documentation

## 2016-12-19 DIAGNOSIS — H52229 Regular astigmatism, unspecified eye: Secondary | ICD-10-CM | POA: Insufficient documentation

## 2016-12-19 DIAGNOSIS — M76899 Other specified enthesopathies of unspecified lower limb, excluding foot: Secondary | ICD-10-CM | POA: Insufficient documentation

## 2016-12-19 DIAGNOSIS — M1991 Primary osteoarthritis, unspecified site: Secondary | ICD-10-CM | POA: Insufficient documentation

## 2016-12-19 DIAGNOSIS — M25639 Stiffness of unspecified wrist, not elsewhere classified: Secondary | ICD-10-CM | POA: Insufficient documentation

## 2016-12-19 DIAGNOSIS — Q762 Congenital spondylolisthesis: Secondary | ICD-10-CM | POA: Insufficient documentation

## 2016-12-19 DIAGNOSIS — S52539A Colles' fracture of unspecified radius, initial encounter for closed fracture: Secondary | ICD-10-CM | POA: Insufficient documentation

## 2016-12-19 DIAGNOSIS — S93409A Sprain of unspecified ligament of unspecified ankle, initial encounter: Secondary | ICD-10-CM | POA: Insufficient documentation

## 2016-12-19 DIAGNOSIS — Z8709 Personal history of other diseases of the respiratory system: Secondary | ICD-10-CM | POA: Diagnosis not present

## 2016-12-19 DIAGNOSIS — M25549 Pain in joints of unspecified hand: Secondary | ICD-10-CM | POA: Insufficient documentation

## 2016-12-19 DIAGNOSIS — R209 Unspecified disturbances of skin sensation: Secondary | ICD-10-CM | POA: Insufficient documentation

## 2016-12-19 DIAGNOSIS — M6281 Muscle weakness (generalized): Secondary | ICD-10-CM | POA: Insufficient documentation

## 2016-12-19 DIAGNOSIS — S8000XA Contusion of unspecified knee, initial encounter: Secondary | ICD-10-CM | POA: Insufficient documentation

## 2016-12-19 DIAGNOSIS — M5136 Other intervertebral disc degeneration, lumbar region: Secondary | ICD-10-CM | POA: Insufficient documentation

## 2016-12-19 DIAGNOSIS — M533 Sacrococcygeal disorders, not elsewhere classified: Secondary | ICD-10-CM | POA: Insufficient documentation

## 2016-12-19 DIAGNOSIS — M25559 Pain in unspecified hip: Secondary | ICD-10-CM | POA: Insufficient documentation

## 2016-12-19 DIAGNOSIS — S72009A Fracture of unspecified part of neck of unspecified femur, initial encounter for closed fracture: Secondary | ICD-10-CM | POA: Insufficient documentation

## 2016-12-19 DIAGNOSIS — R05 Cough: Secondary | ICD-10-CM | POA: Diagnosis not present

## 2016-12-19 DIAGNOSIS — S5290XB Unspecified fracture of unspecified forearm, initial encounter for open fracture type I or II: Secondary | ICD-10-CM | POA: Insufficient documentation

## 2016-12-19 DIAGNOSIS — M25649 Stiffness of unspecified hand, not elsewhere classified: Secondary | ICD-10-CM | POA: Insufficient documentation

## 2016-12-19 DIAGNOSIS — S52509A Unspecified fracture of the lower end of unspecified radius, initial encounter for closed fracture: Secondary | ICD-10-CM | POA: Insufficient documentation

## 2016-12-19 DIAGNOSIS — M779 Enthesopathy, unspecified: Secondary | ICD-10-CM | POA: Insufficient documentation

## 2016-12-19 DIAGNOSIS — M24539 Contracture, unspecified wrist: Secondary | ICD-10-CM | POA: Insufficient documentation

## 2016-12-19 DIAGNOSIS — M47812 Spondylosis without myelopathy or radiculopathy, cervical region: Secondary | ICD-10-CM | POA: Insufficient documentation

## 2016-12-19 DIAGNOSIS — M4310 Spondylolisthesis, site unspecified: Secondary | ICD-10-CM | POA: Insufficient documentation

## 2016-12-19 DIAGNOSIS — R928 Other abnormal and inconclusive findings on diagnostic imaging of breast: Secondary | ICD-10-CM | POA: Insufficient documentation

## 2016-12-19 DIAGNOSIS — M7060 Trochanteric bursitis, unspecified hip: Secondary | ICD-10-CM | POA: Insufficient documentation

## 2016-12-19 DIAGNOSIS — H524 Presbyopia: Secondary | ICD-10-CM | POA: Insufficient documentation

## 2016-12-19 DIAGNOSIS — N63 Unspecified lump in unspecified breast: Secondary | ICD-10-CM | POA: Insufficient documentation

## 2016-12-19 MED ORDER — LORATADINE 10 MG PO TABS: 10.0000 mg | ORAL_TABLET | Freq: Every day | ORAL | 5 refills | Status: DC

## 2016-12-19 NOTE — Patient Instructions (Addendum)
Stop Breo inhaler Continue Flonase inhaler Claritin - 10 mg. One pill every day at bedtime Follow-up in 2-3 months

## 2016-12-19 NOTE — Progress Notes (Signed)
PULMONARY OFFICE FOLLOW UP NOTE  Requesting MD/Service: Manuella Ghazi, MD Date of initial consultation: 09/25/16 Reason for consultation: Cough  PT PROFILE: 83 y.o. female never smoker referred for evaluation and mgmt of chronic cough. Wheezing noted on exam. Initial plan: Continue PPI and nasal steroid. Add Breo inhaler  SUBJ:  Routine reevaluation for chronic cough. She insists that she is using the Breo inhaler and Flonase as prescribed. She continues to have cough and sneezing. She also describes copious nasal drainage which is clear. Denies CP, fever, purulent sputum, hemoptysis, LE edema and calf tenderness.   Vitals:   12/19/16 0955 12/19/16 1005  BP:  140/68  Pulse:  75  SpO2:  96%  Weight: 166 lb (75.3 kg)   Height: 5\' 5"  (1.651 m)      EXAM:  Gen: NAD HEENT: NCAT, mild to moderate rhinitis Neck: No LAN or JVD Lungs: No wheezes or other adventitious sounds Cardiovascular: RRR, no murmurs noted Abdomen: Soft, nontender, normal BS Ext: without clubbing, cyanosis, trace ankle edema.  Neuro: grossly intact  DATA:   BMP Latest Ref Rng & Units 05/17/2016 01/23/2016 09/22/2015  Glucose 65 - 99 mg/dL 104(H) 147(H) 104(H)  BUN 7 - 25 mg/dL 15 21(H) 14  Creatinine 0.60 - 0.88 mg/dL 0.76 0.82 0.76  BUN/Creat Ratio 12 - 28 - - 18  Sodium 135 - 146 mmol/L 142 141 143  Potassium 3.5 - 5.3 mmol/L 5.3 4.2 4.3  Chloride 98 - 110 mmol/L 105 108 103  CO2 20 - 31 mmol/L 30 24 25   Calcium 8.6 - 10.4 mg/dL 9.2 8.9 9.1    CBC Latest Ref Rng & Units 01/23/2016 08/31/2015 06/21/2015  WBC 3.6 - 11.0 K/uL 7.4 7.4 7.0  Hemoglobin 12.0 - 16.0 g/dL 14.0 13.9 13.5  Hematocrit 35.0 - 47.0 % 40.1 41.9 41.4  Platelets 150 - 440 K/uL 236 179 157    CXR (07/20/16):  No acute findings  IMPRESSION:   Chronic cough - suspect mostly due to chronic rhinosinusitis with posterior nasal drainage and upper airway cough syndrome. Doubt significant chronic asthmatic bronchitis although at one point she did feel  that she was benefited by [facility] inhaler  PLAN:  Trial off Breo inhaler - may remain off if her cough does not worsen Continue Flonase inhaler as previously prescribed New Rx: Claritin - 10 mg. One pill every day at bedtime Follow-up in 2-3 months   Merton Border, MD PCCM service Mobile [phone] Pager [phone] 12/19/2016 10:40 AM

## 2016-12-20 ENCOUNTER — Ambulatory Visit: Payer: Medicare Other

## 2016-12-21 ENCOUNTER — Telehealth: Payer: Self-pay | Admitting: Family Medicine

## 2016-12-21 ENCOUNTER — Ambulatory Visit: Payer: Medicare Other

## 2016-12-21 MED ORDER — LOSARTAN POTASSIUM 100 MG PO TABS: ORAL_TABLET | ORAL | 0 refills | Status: DC

## 2016-12-21 NOTE — Telephone Encounter (Signed)
Medication has been refilled and sent to [facility] [address] 

## 2016-12-21 NOTE — Telephone Encounter (Signed)
Pt requesting refill on losartin. Please send to [facility] [address]

## 2016-12-26 NOTE — Progress Notes (Signed)
Chief Complaint:  Chief Complaint  Patient presents with  . PTNS    Urge Incontinence     HPI: 83 yo WF who presents today to begin PTNS therapy.  This will be # 4/12 weekly treatments.  Background history    Patient states that she has had urinary incontinence for several years.  She has been tried on anticholinergics and Myrbetriq, but she could not tolerate the side effects.  She also completed a 12 weekly series of PTNS and some maintenance PTNS.   She feels that the PTNS may have been helpful.  Patient has incontinence with urgency.   She is experiencing incontinent episodes during the day.  Her incontinence volume is large, soaking through her clothes.   She is wearing 2 to 3 thick pads/depends daily.  She has recently experienced night time incontinence.  The patient has been experiencing urgency x 4-7, frequency x 4-7, not restricting fluids to avoid visits to the restroom, is engaging in toilet mapping, incontinence x 8 or more and nocturia x 0-3.   She does not have a history of urinary tract infections, STI's or injury to the bladder.   She denies dysuria, gross hematuria, suprapubic pain, back pain, abdominal pain or flank pain.  She has not had any recent fevers, chills, nausea or vomiting.  She does not have a history of nephrolithiasis, GU surgery or GU trauma.   She is not sexually active.  She is post menopausal.  She admits to constipation.  She is not having pain with bladder filling.    She has not had any recent imaging studies.   She is drinking only water daily.   Her risk factors for incontinence are age, depression, vaginal atrophy and pelvic surgery.  She is taking antihistamines, decongestants, benzo's, opioids, ACE inhibitors and diuretics.   Serum creatinine 0.76 in 05/2016   Previous Therapy:     She has failed Myrbetriq and anticholinergics   Contraindications present for PTNS      Pacemaker - NO       Implantable defibrillator - NO      History of abnormal  bleeding - NO      History of neuropathies or nerve damage - NO  Discussed with patient possible complications of procedure, such as discomfort, bleeding at insertion/stimulation site, procedure consent signed  Patient goals:     To decrease trips to the bathroom.    Reemphasized that most patient's will see benefit by the 8th week of treatment, but about 10 to 20% of patient may be late responders.  If they happen to be late responders, it is important to continue with the therapy beyond the 12 weekly treatments as many of those patient find benefit.  Today, she is complaining of nocturia and incontinence.   After her third treatment, she is experiencing 6 day time voids (improved), 3 night time voids (improved), mild urgency and no incontinence (stable).  She denies fevers, chills, nausea and vomiting.  She denies dysuria, gross hematuria and suprapubic pain.    PMH: Past Medical History:  Diagnosis Date  . Arthritis   . Bladder incontinence   . Breast cancer ([place]) 1998  . Chronic systolic CHF (congestive heart failure) ([place]) 01/10/2016   a. 09/2015 Echo: EF 40-45%, possible inf HK, mod PAH (PASP 30mmHg).  . Depression   . GERD (gastroesophageal reflux disease)   . History of kidney stones   . HLD (hyperlipidemia)   . Hypertension   . Neuropathy of  both feet   . NICM (nonischemic cardiomyopathy) ([place])    a. 08/2015 Lexiscan MV: EF 39%, no ischemia; b. 09/2015 Echo: EF 40-45%.  . Right Humerus head fracture    a. 07/2016 - managed conservatively.  . 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 ABDOMINAL HYSTERECTOMY    . BREAST LUMPECTOMY    . CATARACT EXTRACTION    . CHOLECYSTECTOMY    . JOINT REPLACEMENT Right    Hip  . KNEE SURGERY Right     Home Medications:  Allergies as of 12/27/2016      Reactions   2,4-d Dimethylamine (amisol) Other (See Comments)   Other Reaction: OTHER REACTION-IRRITATION TO Esophagous     Celebrex [celecoxib] Other (See Comments)    esophagitis Esophageal spasms     Ciprofloxacin Other (See Comments)   Esophageal irritation   Ibuprofen    Other reaction(s): Other (See Comments) reflux   Metronidazole Other (See Comments)   neuropathy   Naproxen Other (See Comments)   GI UPSET   Other Other (See Comments)   Other Reaction: esophagitis      Medication List       Accurate as of 12/27/16  1:58 PM. Always use your most recent med list.          Cholecalciferol 2000 units Tabs Take 1 tablet by mouth daily.   fluticasone 50 MCG/ACT nasal spray Commonly known as:  FLONASE Place 2 sprays into both nostrils at bedtime.   fluticasone furoate-vilanterol 100-25 MCG/INH Aepb Commonly known as:  BREO ELLIPTA Inhale 1 puff into the lungs daily.   loratadine 10 MG tablet Commonly known as:  CLARITIN Take 1 tablet (10 mg total) by mouth at bedtime.   LORazepam 0.5 MG tablet Commonly known as:  ATIVAN Take 1 tablet (0.5 mg total) by mouth 2 (two) times daily as needed for anxiety.   losartan 100 MG tablet Commonly known as:  COZAAR TAKE 1 TABLET(100 MG) BY MOUTH DAILY   lovastatin 40 MG tablet Commonly known as:  MEVACOR Take 1 tablet (40 mg total) by mouth daily.   metoprolol tartrate 50 MG tablet Commonly known as:  LOPRESSOR TAKE 1 TABLET(50 MG) BY MOUTH TWICE DAILY   Polyethylene Glycol 1450 Liqd POLYETHYLENE GLYCOL (Powder) - Historical Medication  17 grams daily Active   PRESERVISION/LUTEIN Caps Take 1 each by mouth 2 (two) times daily.   traMADol 50 MG tablet Commonly known as:  ULTRAM Take 1 tablet (50 mg total) by mouth 2 (two) times daily.       Allergies:  Allergies  Allergen Reactions  . 2,4-D Dimethylamine (Amisol) Other (See Comments)    Other Reaction: OTHER REACTION-IRRITATION TO Esophagous  . Celebrex [Celecoxib] Other (See Comments)     esophagitis Esophageal spasms    . Ciprofloxacin Other (See Comments)    Esophageal  irritation  . Ibuprofen     Other reaction(s): Other (See Comments) reflux  . Metronidazole Other (See Comments)    neuropathy  . Naproxen Other (See Comments)    GI UPSET  . Other Other (See Comments)    Other Reaction: esophagitis    Family History: Family History  Problem Relation Age of Onset  . Stroke Mother   . Diabetes Father   . Heart disease Father   . Kidney disease Neg Hx   . Bladder Cancer Neg Hx   . Kidney cancer Neg Hx  Social History:  reports that she has quit smoking. She has never used smokeless tobacco. She reports that she does not drink alcohol or use drugs.  ROS: UROLOGY Frequent Urination?: No Hard to postpone urination?: No Burning/pain with urination?: No Get up at night to urinate?: Yes Leakage of urine?: Yes Urine stream starts and stops?: No Trouble starting stream?: No Do you have to strain to urinate?: No Blood in urine?: No Urinary tract infection?: No Sexually transmitted disease?: No Injury to kidneys or bladder?: No Painful intercourse?: No Weak stream?: No Currently pregnant?: No Vaginal bleeding?: No Last menstrual period?: n  Gastrointestinal Nausea?: No Vomiting?: No Indigestion/heartburn?: No Diarrhea?: No Constipation?: No  Constitutional Fever: No Night sweats?: No Weight loss?: No Fatigue?: No  Skin Skin rash/lesions?: No Itching?: No  Eyes Blurred vision?: No Double vision?: No  Ears/Nose/Throat Sore throat?: No Sinus problems?: Yes  Hematologic/Lymphatic Swollen glands?: No Easy bruising?: Yes  Cardiovascular Leg swelling?: No Chest pain?: No  Respiratory Cough?: No Shortness of breath?: No  Endocrine Excessive thirst?: No  Musculoskeletal Back pain?: No Joint pain?: No  Neurological Headaches?: No Dizziness?: No  Psychologic Depression?: No Anxiety?: No   Physical Exam: BP (!) 185/71   Pulse 62   Ht 5\' 4"  (1.626 m)   Wt 165 lb 4.8 oz (75 kg)   BMI 28.37 kg/m     Constitutional: Well nourished. Alert and oriented, No acute distress. HEENT: [place] AT, moist mucus membranes. Trachea midline, no masses. Cardiovascular: No clubbing, cyanosis, or edema. Respiratory: Normal respiratory effort, no increased work of breathing. Skin: No rashes, bruises or suspicious lesions. Lymph: No cervical or inguinal adenopathy. Neurologic: Grossly intact, no focal deficits, moving all 4 extremities. Psychiatric: Normal mood and affect.  Laboratory Data: Lab Results  Component Value Date   WBC 7.4 01/23/2016   HGB 14.0 01/23/2016   HCT 40.1 01/23/2016   MCV 89.7 01/23/2016   PLT 236 01/23/2016    Lab Results  Component Value Date   CREATININE 0.76 05/17/2016    Lab Results  Component Value Date   HGBA1C 5.4 02/14/2016       Component Value Date/Time   CHOL 147 10/11/2016 0804   CHOL 131 06/04/2015 0810   HDL 48 (L) 10/11/2016 0804   HDL 54 06/04/2015 0810   CHOLHDL 3.1 10/11/2016 0804   VLDL 29 10/11/2016 0804   LDLCALC 70 10/11/2016 0804   LDLCALC 53 06/04/2015 0810    Lab Results  Component Value Date   AST 15 05/17/2016   Lab Results  Component Value Date   ALT 10 05/17/2016   I have reviewed the labs.    PTNS treatment: The needle electrode was inserted into the lower, inner aspect of the patient's left leg. The surface electrode was placed on the inside arch of the foot on the treatment leg. The lead set was connected to the stimulator and the needle electrode clip was connected to the needle electrode. The stimulator that produces an adjustable electrical pulse that travels to the sacral nerve plexus via the tibial nerve was increased to 12 until the patient received a toe flex.    Assessment & Plan:   1. Urge incontinence  Treatment Plan:  The needle electrode was removed without difficulty to the patient.  Patient tolerated the procedure for 30 minutes.  She will return next week for # 5 out of 12 of their weekly PTNS  treatment's  Return for # 5 PTNS.  These notes generated with voice  recognition software. I apologize for typographical errors.  Zara Council, [facility] [address] [place], [place] [address] [phone]

## 2016-12-27 ENCOUNTER — Encounter: Payer: Self-pay | Admitting: Urology

## 2016-12-27 ENCOUNTER — Ambulatory Visit (INDEPENDENT_AMBULATORY_CARE_PROVIDER_SITE_OTHER): Payer: Medicare Other | Admitting: Urology

## 2016-12-27 VITALS — BP 185/71 | HR 62 | Ht 64.0 in | Wt 165.3 lb

## 2016-12-27 DIAGNOSIS — N3941 Urge incontinence: Secondary | ICD-10-CM | POA: Diagnosis not present

## 2016-12-27 NOTE — Progress Notes (Signed)
PTNS  Session # 4  Health & Social Factors: No Change Caffeine: 0 Alcohol: 0 Daytime voids #per day: 6 Night-time voids #per night: 3 Urgency: Mild Incontinence Episodes #per day: 0 Ankle used: Left Treatment Setting: 12 Feeling/ Response: Toe Flex  Preformed By: Zara Council PA-C   Assistant: Lyndee Hensen CMA  Follow Up: One week

## 2016-12-29 ENCOUNTER — Ambulatory Visit (INDEPENDENT_AMBULATORY_CARE_PROVIDER_SITE_OTHER): Payer: Medicare Other | Admitting: Family Medicine

## 2016-12-29 ENCOUNTER — Encounter: Payer: Self-pay | Admitting: Family Medicine

## 2016-12-29 DIAGNOSIS — M25561 Pain in right knee: Secondary | ICD-10-CM | POA: Diagnosis not present

## 2016-12-29 DIAGNOSIS — G8929 Other chronic pain: Secondary | ICD-10-CM

## 2016-12-29 DIAGNOSIS — M25562 Pain in left knee: Secondary | ICD-10-CM | POA: Diagnosis not present

## 2016-12-29 MED ORDER — TRAMADOL HCL 50 MG PO TABS: 50.0000 mg | ORAL_TABLET | Freq: Two times a day (BID) | ORAL | 0 refills | Status: DC

## 2016-12-29 NOTE — Progress Notes (Signed)
Name: Jodi Bullock   MRN: [id number]    DOB: December 06, 1924   Date:12/29/2016       Progress Note  Subjective  Chief Complaint  Chief Complaint  Patient presents with  . Follow-up    3 mo  . Medication Refill    HPI  Patient is here for follow up and obtain refills on Tramadol. She has chronic back and bilateral knee pain, has disc narrowing and anterolisthesis of L4 upon L5, bilateral degenrative arthritis in knees. She takes Tramadol 50 mg BID for pain, this helps relieve her pain, no adverse effects reported.    Past Medical History:  Diagnosis Date  . Arthritis   . Bladder incontinence   . Breast cancer ([place]) 1998  . Chronic systolic CHF (congestive heart failure) ([place]) 01/10/2016   a. 09/2015 Echo: EF 40-45%, possible inf HK, mod PAH (PASP 51mmHg).  . Depression   . GERD (gastroesophageal reflux disease)   . History of kidney stones   . HLD (hyperlipidemia)   . Hypertension   . Neuropathy of both feet   . NICM (nonischemic cardiomyopathy) ([place])    a. 08/2015 Lexiscan MV: EF 39%, no ischemia; b. 09/2015 Echo: EF 40-45%.  . Right Humerus head fracture    a. 07/2016 - managed conservatively.  . 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 ABDOMINAL HYSTERECTOMY    . BREAST LUMPECTOMY    . CATARACT EXTRACTION    . CHOLECYSTECTOMY    . JOINT REPLACEMENT Right    Hip  . KNEE SURGERY Right     Family History  Problem Relation Age of Onset  . Stroke Mother   . Diabetes Father   . Heart disease Father   . Kidney disease Neg Hx   . Bladder Cancer Neg Hx   . Kidney cancer Neg Hx     Social History   Social History  . Marital status: Married    Spouse name: N/A  . Number of children: N/A  . Years of education: N/A   Occupational History  . Not on file.   Social History Main Topics  . Smoking status: Former Research scientist (life sciences)  . Smokeless tobacco: Never Used     Comment: smoked in high school for a few years only  . Alcohol use  No  . Drug use: No  . Sexual activity: Not Currently   Other Topics Concern  . Not on file   Social History Narrative  . No narrative on file     Current Outpatient Prescriptions:  .  Cholecalciferol 2000 UNITS TABS, Take 1 tablet by mouth daily., Disp: , Rfl:  .  fluticasone (FLONASE) 50 MCG/ACT nasal spray, Place 2 sprays into both nostrils at bedtime., Disp: 16 g, Rfl: 5 .  loratadine (CLARITIN) 10 MG tablet, Take 1 tablet (10 mg total) by mouth at bedtime., Disp: 30 tablet, Rfl: 5 .  LORazepam (ATIVAN) 0.5 MG tablet, Take 1 tablet (0.5 mg total) by mouth 2 (two) times daily as needed for anxiety., Disp: 60 tablet, Rfl: 2 .  losartan (COZAAR) 100 MG tablet, TAKE 1 TABLET(100 MG) BY MOUTH DAILY, Disp: 90 tablet, Rfl: 0 .  lovastatin (MEVACOR) 40 MG tablet, Take 1 tablet (40 mg total) by mouth daily., Disp: 90 tablet, Rfl: 0 .  metoprolol tartrate (LOPRESSOR) 50 MG tablet, TAKE 1 TABLET(50 MG) BY MOUTH TWICE DAILY, Disp: 180 tablet, Rfl: 1 .  Multiple Vitamins-Minerals (PRESERVISION/LUTEIN) CAPS, Take 1  each by mouth 2 (two) times daily., Disp: , Rfl:  .  Polyethylene Glycol 1450 LIQD, POLYETHYLENE GLYCOL (Powder) - Historical Medication  17 grams daily Active, Disp: , Rfl:  .  traMADol (ULTRAM) 50 MG tablet, Take 1 tablet (50 mg total) by mouth 2 (two) times daily., Disp: 60 tablet, Rfl: 0 .  fluticasone furoate-vilanterol (BREO ELLIPTA) 100-25 MCG/INH AEPB, Inhale 1 puff into the lungs daily. (Patient not taking: Reported on 12/29/2016), Disp: 60 each, Rfl: 5  Allergies  Allergen Reactions  . 2,4-D Dimethylamine (Amisol) Other (See Comments)    Other Reaction: OTHER REACTION-IRRITATION TO Esophagous  . Celebrex [Celecoxib] Other (See Comments)     esophagitis Esophageal spasms    . Ciprofloxacin Other (See Comments)    Esophageal irritation  . Ibuprofen     Other reaction(s): Other (See Comments) reflux  . Metronidazole Other (See Comments)    neuropathy  . Naproxen Other  (See Comments)    GI UPSET  . Other Other (See Comments)    Other Reaction: esophagitis     ROS  Please see history of present illness for complete discussion of ROS  Objective  Vitals:   12/29/16 1111  BP: 133/75  Pulse: 74  Resp: 16  Temp: (!) 97.4 F (36.3 C)  TempSrc: Oral  SpO2: 97%  Weight: 167 lb (75.8 kg)  Height: 5\' 5"  (1.651 m)    Physical Exam  Constitutional: She is oriented to person, place, and time and well-developed, well-nourished, and in no distress.  HENT:  Head: Normocephalic and atraumatic.  Cardiovascular: Normal rate, regular rhythm and normal heart sounds.   No murmur heard. Pulmonary/Chest: Effort normal and breath sounds normal. She has no wheezes.  Musculoskeletal:       Right knee: She exhibits no swelling and no erythema. Tenderness found. Medial joint line tenderness noted.  Mild warmth throughout the right knee, tenderness to palpation over the superior and medial aspect, slightly restricted ROM 2/2 pain.  Neurological: She is alert and oriented to person, place, and time.  Nursing note and vitals reviewed.    Recent Results (from the past 2160 hour(s))  Lipid panel     Status: Abnormal   Collection Time: 10/11/16  8:04 AM  Result Value Ref Range   Cholesterol 147 <200 mg/dL   Triglycerides 147 <150 mg/dL   HDL 48 (L) >50 mg/dL   Total CHOL/HDL Ratio 3.1 <5.0 Ratio   VLDL 29 <30 mg/dL   LDL Cholesterol 70 <100 mg/dL     Assessment & Plan  1. Bilateral chronic knee pain Stable and responsive to tramadol taken up to twice daily as needed, encouraged activity as tolerated, refills provided - traMADol (ULTRAM) 50 MG tablet; Take 1 tablet (50 mg total) by mouth 2 (two) times daily.  Dispense: 60 tablet; Refill: 0   Rylynn Kobs [facility] 12/29/2016 11:33 AM

## 2017-01-02 NOTE — Progress Notes (Signed)
Chief Complaint:  Chief Complaint  Patient presents with  . PTNS    Urge incontinence     HPI: 83 yo WF who presents today to begin PTNS therapy.  This will be # 5/12 weekly treatments.  Background history    Patient stated that she has had urinary incontinence for several years.  She had been tried on anticholinergics and Myrbetriq, but she could not tolerate the side effects.  She also completed a 12 weekly series of PTNS and some maintenance PTNS.   She feels that the PTNS may have been helpful.  Patient has incontinence with urgency.   She is experiencing incontinent episodes during the day.  Her incontinence volume is large, soaking through her clothes.   She is wearing 2 to 3 thick pads/depends daily.  She has recently experienced night time incontinence.  The patient has been experiencing urgency x 4-7, frequency x 4-7, not restricting fluids to avoid visits to the restroom, is engaging in toilet mapping, incontinence x 8 or more and nocturia x 0-3.   She does not have a history of urinary tract infections, STI's or injury to the bladder.   She denies dysuria, gross hematuria, suprapubic pain, back pain, abdominal pain or flank pain.  She has not had any recent fevers, chills, nausea or vomiting.  She does not have a history of nephrolithiasis, GU surgery or GU trauma.   She is not sexually active.  She is post menopausal.  She admits to constipation.  She is not having pain with bladder filling.    She has not had any recent imaging studies.   She is drinking only water daily.   Her risk factors for incontinence are age, depression, vaginal atrophy and pelvic surgery.  She is taking antihistamines, decongestants, benzo's, opioids, ACE inhibitors and diuretics.   Serum creatinine 0.76 in 05/2016   Previous Therapy:     She has failed Myrbetriq and anticholinergics   Contraindications present for PTNS      Pacemaker - NO       Implantable defibrillator - NO      History of abnormal  bleeding - NO      History of neuropathies or nerve damage - NO  Discussed with patient possible complications of procedure, such as discomfort, bleeding at insertion/stimulation site, procedure consent signed  Patient goals:     To decrease trips to the bathroom.    Reemphasized that most patient's will see benefit by the 8th week of treatment, but about 10 to 20% of patient may be late responders.  If they happen to be late responders, it is important to continue with the therapy beyond the 12 weekly treatments as many of those patient find benefit.  Symptoms prior to starting PTNS therapy - The patient has been experiencing urgency x 4-7, frequency x 4-7, not restricting fluids to avoid visits to the restroom, is engaging in toilet mapping, incontinence x 8 or more and nocturia x 0-3.    Today, she is complaining of incontinence.   After her fourth treatment, she is experiencing 6 day time voids (stable), 4 night time voids (worse), mild urgency and no incontinence (stable).  She denies fevers, chills, nausea and vomiting.  She denies dysuria, gross hematuria and suprapubic pain.    PMH: Past Medical History:  Diagnosis Date  . Arthritis   . Bladder incontinence   . Breast cancer ([place]) 1998  . Chronic systolic CHF (congestive heart failure) ([place]) 01/10/2016   a.  09/2015 Echo: EF 40-45%, possible inf HK, mod PAH (PASP 54mmHg).  . Depression   . GERD (gastroesophageal reflux disease)   . History of kidney stones   . HLD (hyperlipidemia)   . Hypertension   . Neuropathy of both feet   . NICM (nonischemic cardiomyopathy) ([place])    a. 08/2015 Lexiscan MV: EF 39%, no ischemia; b. 09/2015 Echo: EF 40-45%.  . Right Humerus head fracture    a. 07/2016 - managed conservatively.  . 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 ABDOMINAL HYSTERECTOMY    . BREAST LUMPECTOMY    . CATARACT EXTRACTION    . CHOLECYSTECTOMY    . JOINT  REPLACEMENT Right    Hip  . KNEE SURGERY Right     Home Medications:  Allergies as of 01/03/2017      Reactions   2,4-d Dimethylamine (amisol) Other (See Comments)   Other Reaction: OTHER REACTION-IRRITATION TO Esophagous   Celebrex [celecoxib] Other (See Comments)    esophagitis Esophageal spasms     Ciprofloxacin Other (See Comments)   Esophageal irritation   Ibuprofen    Other reaction(s): Other (See Comments) reflux   Metronidazole Other (See Comments)   neuropathy   Naproxen Other (See Comments)   GI UPSET   Other Other (See Comments)   Other Reaction: esophagitis      Medication List       Accurate as of 01/03/17 11:01 AM. Always use your most recent med list.          Cholecalciferol 2000 units Tabs Take 1 tablet by mouth daily.   fluticasone 50 MCG/ACT nasal spray Commonly known as:  FLONASE Place 2 sprays into both nostrils at bedtime.   fluticasone furoate-vilanterol 100-25 MCG/INH Aepb Commonly known as:  BREO ELLIPTA Inhale 1 puff into the lungs daily.   loratadine 10 MG tablet Commonly known as:  CLARITIN Take 1 tablet (10 mg total) by mouth at bedtime.   LORazepam 0.5 MG tablet Commonly known as:  ATIVAN Take 1 tablet (0.5 mg total) by mouth 2 (two) times daily as needed for anxiety.   losartan 100 MG tablet Commonly known as:  COZAAR TAKE 1 TABLET(100 MG) BY MOUTH DAILY   lovastatin 40 MG tablet Commonly known as:  MEVACOR Take 1 tablet (40 mg total) by mouth daily.   metoprolol tartrate 50 MG tablet Commonly known as:  LOPRESSOR TAKE 1 TABLET(50 MG) BY MOUTH TWICE DAILY   Polyethylene Glycol 1450 Liqd POLYETHYLENE GLYCOL (Powder) - Historical Medication  17 grams daily Active   PRESERVISION/LUTEIN Caps Take 1 each by mouth 2 (two) times daily.   traMADol 50 MG tablet Commonly known as:  ULTRAM Take 1 tablet (50 mg total) by mouth 2 (two) times daily.            Discharge Care Instructions        Start     Ordered    01/03/17 0000  PTNS-Percutaneous Tibial Nerve Stimulati    Question:  Porcedure Location  Answer:  [facility]   01/03/17 [id number]      Allergies:  Allergies  Allergen Reactions  . 2,4-D Dimethylamine (Amisol) Other (See Comments)    Other Reaction: OTHER REACTION-IRRITATION TO Esophagous  . Celebrex [Celecoxib] Other (See Comments)     esophagitis Esophageal spasms    . Ciprofloxacin Other (See Comments)    Esophageal irritation  . Ibuprofen  Other reaction(s): Other (See Comments) reflux  . Metronidazole Other (See Comments)    neuropathy  . Naproxen Other (See Comments)    GI UPSET  . Other Other (See Comments)    Other Reaction: esophagitis    Family History: Family History  Problem Relation Age of Onset  . Stroke Mother   . Diabetes Father   . Heart disease Father   . Kidney disease Neg Hx   . Bladder Cancer Neg Hx   . Kidney cancer Neg Hx     Social History:  reports that she has quit smoking. She has never used smokeless tobacco. She reports that she does not drink alcohol or use drugs.  ROS: UROLOGY Frequent Urination?: Yes Hard to postpone urination?: No Burning/pain with urination?: No Get up at night to urinate?: Yes Leakage of urine?: Yes Urine stream starts and stops?: No Trouble starting stream?: No Do you have to strain to urinate?: No Blood in urine?: No Urinary tract infection?: No Sexually transmitted disease?: No Injury to kidneys or bladder?: No Painful intercourse?: No Weak stream?: No Currently pregnant?: No Vaginal bleeding?: No Last menstrual period?: n  Gastrointestinal Nausea?: No Vomiting?: No Indigestion/heartburn?: No Diarrhea?: No Constipation?: No  Constitutional Fever: No Night sweats?: No Weight loss?: No Fatigue?: No  Skin Skin rash/lesions?: No Itching?: No  Eyes Blurred vision?: No Double vision?: No  Ears/Nose/Throat Sore throat?: No Sinus problems?:  Yes  Hematologic/Lymphatic Swollen glands?: No Easy bruising?: Yes  Cardiovascular Leg swelling?: No Chest pain?: No  Respiratory Cough?: No Shortness of breath?: No  Endocrine Excessive thirst?: No  Musculoskeletal Back pain?: No Joint pain?: No  Neurological Headaches?: No Dizziness?: No  Psychologic Depression?: No Anxiety?: No   Physical Exam: BP (!) 192/65   Pulse 64   Ht 5\' 6"  (1.676 m)   Wt 166 lb 6.4 oz (75.5 kg)   BMI 26.86 kg/m   Constitutional: Well nourished. Alert and oriented, No acute distress. HEENT: [place] AT, moist mucus membranes. Trachea midline, no masses. Cardiovascular: No clubbing, cyanosis, or edema. Respiratory: Normal respiratory effort, no increased work of breathing. Skin: No rashes, bruises or suspicious lesions. Lymph: No cervical or inguinal adenopathy. Neurologic: Grossly intact, no focal deficits, moving all 4 extremities. Psychiatric: Normal mood and affect.  Laboratory Data: Lab Results  Component Value Date   WBC 7.4 01/23/2016   HGB 14.0 01/23/2016   HCT 40.1 01/23/2016   MCV 89.7 01/23/2016   PLT 236 01/23/2016    Lab Results  Component Value Date   CREATININE 0.76 05/17/2016    Lab Results  Component Value Date   HGBA1C 5.4 02/14/2016       Component Value Date/Time   CHOL 147 10/11/2016 0804   CHOL 131 06/04/2015 0810   HDL 48 (L) 10/11/2016 0804   HDL 54 06/04/2015 0810   CHOLHDL 3.1 10/11/2016 0804   VLDL 29 10/11/2016 0804   LDLCALC 70 10/11/2016 0804   LDLCALC 53 06/04/2015 0810    Lab Results  Component Value Date   AST 15 05/17/2016   Lab Results  Component Value Date   ALT 10 05/17/2016   I have reviewed the labs.    PTNS treatment: The needle electrode was inserted into the lower, inner aspect of the patient's left leg. The surface electrode was placed on the inside arch of the foot on the treatment leg. The lead set was connected to the stimulator and the needle electrode clip was  connected to the needle electrode. The  stimulator that produces an adjustable electrical pulse that travels to the sacral nerve plexus via the tibial nerve was increased to 8 until the patient received a toe flex and sensory response.     Assessment & Plan:   1. Urge incontinence  Treatment Plan:  The needle electrode was removed without difficulty to the patient.  Patient tolerated the procedure for 30 minutes.  She will return next week for # 6 out of 12 of their weekly PTNS treatment's  Return in about 1 week (around 01/10/2017) for # 6 PTNS.  These notes generated with voice recognition software. I apologize for typographical errors.  Zara Council, [facility] [address] [place], [place] [address] [phone]

## 2017-01-03 ENCOUNTER — Ambulatory Visit (INDEPENDENT_AMBULATORY_CARE_PROVIDER_SITE_OTHER): Payer: Medicare Other | Admitting: Urology

## 2017-01-03 ENCOUNTER — Encounter: Payer: Self-pay | Admitting: Urology

## 2017-01-03 VITALS — BP 192/65 | HR 64 | Ht 66.0 in | Wt 166.4 lb

## 2017-01-03 DIAGNOSIS — N3941 Urge incontinence: Secondary | ICD-10-CM | POA: Diagnosis not present

## 2017-01-03 NOTE — Progress Notes (Signed)
PTNS  Session # 5  Health & Social Factors: No Change Caffeine: 1 Alcohol: 0 Daytime voids #per day: 6 Night-time voids #per night: 4 Urgency: Mild Incontinence Episodes #per day: 0 Ankle used: Left Treatment Setting: 8 Feeling/ Response: Both  Preformed By: Zara Council PA-C  Assistant: Lyndee Hensen CMA  Follow Up: One week

## 2017-01-09 NOTE — Progress Notes (Signed)
Chief Complaint:  Chief Complaint  Patient presents with  . PTNS    Urge Incontinence     HPI: 83 yo WF who presents today to begin PTNS therapy.  This will be # 6/12 weekly treatments.  Background history    Patient stated that she has had urinary incontinence for several years.  She had been tried on anticholinergics and Myrbetriq, but she could not tolerate the side effects.  She also completed a 12 weekly series of PTNS and some maintenance PTNS.   She feels that the PTNS may have been helpful.  Patient has incontinence with urgency.   She is experiencing incontinent episodes during the day.  Her incontinence volume is large, soaking through her clothes.   She is wearing 2 to 3 thick pads/depends daily.  She has recently experienced night time incontinence.  The patient has been experiencing urgency x 4-7, frequency x 4-7, not restricting fluids to avoid visits to the restroom, is engaging in toilet mapping, incontinence x 8 or more and nocturia x 0-3.   She does not have a history of urinary tract infections, STI's or injury to the bladder.   She denies dysuria, gross hematuria, suprapubic pain, back pain, abdominal pain or flank pain.  She has not had any recent fevers, chills, nausea or vomiting.  She does not have a history of nephrolithiasis, GU surgery or GU trauma.   She is not sexually active.  She is post menopausal.  She admits to constipation.  She is not having pain with bladder filling.    She has not had any recent imaging studies.   She is drinking only water daily.   Her risk factors for incontinence are age, depression, vaginal atrophy and pelvic surgery.  She is taking antihistamines, decongestants, benzo's, opioids, ACE inhibitors and diuretics.   Serum creatinine 0.76 in 05/2016   Previous Therapy:     She has failed Myrbetriq and anticholinergics   Contraindications present for PTNS      Pacemaker - NO       Implantable defibrillator - NO      History of abnormal  bleeding - NO      History of neuropathies or nerve damage - NO  Discussed with patient possible complications of procedure, such as discomfort, bleeding at insertion/stimulation site, procedure consent signed  Patient goals:     To decrease trips to the bathroom.    Reemphasized that most patient's will see benefit by the 8th week of treatment, but about 10 to 20% of patient may be late responders.  If they happen to be late responders, it is important to continue with the therapy beyond the 12 weekly treatments as many of those patient find benefit.  Symptoms prior to starting PTNS therapy - The patient has been experiencing urgency x 4-7, frequency x 4-7, not restricting fluids to avoid visits to the restroom, is engaging in toilet mapping, incontinence x 8 or more and nocturia x 0-3.    Today, she is complaining of incontinence.   After her ffith treatment, she is experiencing 5 day time voids (stable), 3 night time voids (stable), mild urgency and no incontinence (stable).  She denies fevers, chills, nausea and vomiting.  She denies dysuria, gross hematuria and suprapubic pain.    PMH: Past Medical History:  Diagnosis Date  . Arthritis   . Bladder incontinence   . Breast cancer ([place]) 1998  . Chronic systolic CHF (congestive heart failure) ([place]) 01/10/2016   a.  09/2015 Echo: EF 40-45%, possible inf HK, mod PAH (PASP 28mmHg).  . Depression   . GERD (gastroesophageal reflux disease)   . History of kidney stones   . HLD (hyperlipidemia)   . Hypertension   . Neuropathy of both feet   . NICM (nonischemic cardiomyopathy) ([place])    a. 08/2015 Lexiscan MV: EF 39%, no ischemia; b. 09/2015 Echo: EF 40-45%.  . Right Humerus head fracture    a. 07/2016 - managed conservatively.  . 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 ABDOMINAL HYSTERECTOMY    . BREAST LUMPECTOMY    . CATARACT EXTRACTION    . CHOLECYSTECTOMY    . JOINT  REPLACEMENT Right    Hip  . KNEE SURGERY Right     Home Medications:  Allergies as of 01/10/2017      Reactions   2,4-d Dimethylamine (amisol) Other (See Comments)   Other Reaction: OTHER REACTION-IRRITATION TO Esophagous   Celebrex [celecoxib] Other (See Comments)    esophagitis Esophageal spasms     Ciprofloxacin Other (See Comments)   Esophageal irritation   Ibuprofen    Other reaction(s): Other (See Comments) reflux   Metronidazole Other (See Comments)   neuropathy   Naproxen Other (See Comments)   GI UPSET   Other Other (See Comments)   Other Reaction: esophagitis      Medication List       Accurate as of 01/10/17  8:45 AM. Always use your most recent med list.          Cholecalciferol 2000 units Tabs Take 1 tablet by mouth daily.   fluticasone 50 MCG/ACT nasal spray Commonly known as:  FLONASE Place 2 sprays into both nostrils at bedtime.   loratadine 10 MG tablet Commonly known as:  CLARITIN Take 1 tablet (10 mg total) by mouth at bedtime.   LORazepam 0.5 MG tablet Commonly known as:  ATIVAN Take 1 tablet (0.5 mg total) by mouth 2 (two) times daily as needed for anxiety.   losartan 100 MG tablet Commonly known as:  COZAAR TAKE 1 TABLET(100 MG) BY MOUTH DAILY   lovastatin 40 MG tablet Commonly known as:  MEVACOR Take 1 tablet (40 mg total) by mouth daily.   metoprolol tartrate 50 MG tablet Commonly known as:  LOPRESSOR TAKE 1 TABLET(50 MG) BY MOUTH TWICE DAILY   Polyethylene Glycol 1450 Liqd POLYETHYLENE GLYCOL (Powder) - Historical Medication  17 grams daily Active   PRESERVISION/LUTEIN Caps Take 1 each by mouth 2 (two) times daily.   traMADol 50 MG tablet Commonly known as:  ULTRAM Take 1 tablet (50 mg total) by mouth 2 (two) times daily.            Discharge Care Instructions        Start     Ordered   01/10/17 0000  PTNS-Percutaneous Tibial Nerve Stimulati    Question:  Porcedure Location  Answer:  [facility]    01/10/17 0815      Allergies:  Allergies  Allergen Reactions  . 2,4-D Dimethylamine (Amisol) Other (See Comments)    Other Reaction: OTHER REACTION-IRRITATION TO Esophagous  . Celebrex [Celecoxib] Other (See Comments)     esophagitis Esophageal spasms    . Ciprofloxacin Other (See Comments)    Esophageal irritation  . Ibuprofen     Other reaction(s): Other (See Comments) reflux  . Metronidazole Other (See Comments)    neuropathy  . Naproxen  Other (See Comments)    GI UPSET  . Other Other (See Comments)    Other Reaction: esophagitis    Family History: Family History  Problem Relation Age of Onset  . Stroke Mother   . Diabetes Father   . Heart disease Father   . Kidney disease Neg Hx   . Bladder Cancer Neg Hx   . Kidney cancer Neg Hx     Social History:  reports that she has quit smoking. She has never used smokeless tobacco. She reports that she does not drink alcohol or use drugs.  ROS: UROLOGY Frequent Urination?: No Hard to postpone urination?: No Burning/pain with urination?: No Get up at night to urinate?: Yes Leakage of urine?: Yes Urine stream starts and stops?: No Trouble starting stream?: No Do you have to strain to urinate?: No Blood in urine?: No Urinary tract infection?: No Sexually transmitted disease?: No Injury to kidneys or bladder?: No Painful intercourse?: No Weak stream?: No Currently pregnant?: No Vaginal bleeding?: No Last menstrual period?: n  Gastrointestinal Nausea?: No Vomiting?: No Indigestion/heartburn?: No Diarrhea?: No Constipation?: No  Constitutional Fever: No Night sweats?: No Weight loss?: No Fatigue?: No  Skin Skin rash/lesions?: No Itching?: No  Eyes Blurred vision?: No Double vision?: No  Ears/Nose/Throat Sore throat?: No Sinus problems?: No  Hematologic/Lymphatic Swollen glands?: No Easy bruising?: No  Cardiovascular Leg swelling?: No Chest pain?: No  Respiratory Cough?: Yes Shortness  of breath?: No  Endocrine Excessive thirst?: No  Musculoskeletal Back pain?: Yes Joint pain?: No  Neurological Headaches?: No Dizziness?: No  Psychologic Depression?: No Anxiety?: No   Physical Exam: BP (!) 149/68   Pulse 78   Ht 5\' 6"  (1.676 m)   Wt 166 lb 11.2 oz (75.6 kg)   BMI 26.91 kg/m   Constitutional: Well nourished. Alert and oriented, No acute distress. HEENT: [place] AT, moist mucus membranes. Trachea midline, no masses. Cardiovascular: No clubbing, cyanosis, or edema. Respiratory: Normal respiratory effort, no increased work of breathing. Skin: No rashes, bruises or suspicious lesions. Lymph: No cervical or inguinal adenopathy. Neurologic: Grossly intact, no focal deficits, moving all 4 extremities. Psychiatric: Normal mood and affect.  Laboratory Data: Lab Results  Component Value Date   WBC 7.4 01/23/2016   HGB 14.0 01/23/2016   HCT 40.1 01/23/2016   MCV 89.7 01/23/2016   PLT 236 01/23/2016    Lab Results  Component Value Date   CREATININE 0.76 05/17/2016    Lab Results  Component Value Date   HGBA1C 5.4 02/14/2016       Component Value Date/Time   CHOL 147 10/11/2016 0804   CHOL 131 06/04/2015 0810   HDL 48 (L) 10/11/2016 0804   HDL 54 06/04/2015 0810   CHOLHDL 3.1 10/11/2016 0804   VLDL 29 10/11/2016 0804   LDLCALC 70 10/11/2016 0804   LDLCALC 53 06/04/2015 0810    Lab Results  Component Value Date   AST 15 05/17/2016   Lab Results  Component Value Date   ALT 10 05/17/2016   I have reviewed the labs.    PTNS treatment: The needle electrode was inserted into the lower, inner aspect of the patient's right leg. The surface electrode was placed on the inside arch of the foot on the treatment leg. The lead set was connected to the stimulator and the needle electrode clip was connected to the needle electrode. The stimulator that produces an adjustable electrical pulse that travels to the sacral nerve plexus via the tibial nerve was  increased to 2 until the patient received a toe flex and sensory response.     Assessment & Plan:   1. Urge incontinence  Treatment Plan:  The needle electrode was removed without difficulty to the patient.  Patient tolerated the procedure for 30 minutes.  She will return next week for # 7 out of 12 of their weekly PTNS treatment's  Return in about 1 week (around 01/17/2017) for # 7 PTNS.  These notes generated with voice recognition software. I apologize for typographical errors.  Zara Council, [facility] [address] [place], [place] [address] [phone]

## 2017-01-10 ENCOUNTER — Ambulatory Visit (INDEPENDENT_AMBULATORY_CARE_PROVIDER_SITE_OTHER): Payer: Medicare Other | Admitting: Urology

## 2017-01-10 ENCOUNTER — Encounter: Payer: Self-pay | Admitting: Urology

## 2017-01-10 VITALS — BP 149/68 | HR 78 | Ht 66.0 in | Wt 166.7 lb

## 2017-01-10 DIAGNOSIS — N3941 Urge incontinence: Secondary | ICD-10-CM

## 2017-01-10 NOTE — Progress Notes (Signed)
PTNS  Session # 6  Health & Social Factors: No Change Caffeine: 1 Alcohol: 0 Daytime voids #per day: 5 Night-time voids #per night: 3 Urgency: Mild Incontinence Episodes #per day: 0 Ankle used: Right Treatment Setting: 2 Feeling/ Response: Both Comments: Improvement  Preformed By: Zara Council PA-C  Assistant: Lyndee Hensen CMA  Follow Up: One week

## 2017-01-16 NOTE — Progress Notes (Signed)
Chief Complaint:  Chief Complaint  Patient presents with  . PTNS    Urge Incontinence     HPI: 83 yo WF who presents today to begin PTNS therapy.  This will be # 7/12 weekly treatments.  Background history    Patient stated that she has had urinary incontinence for several years.  She had been tried on anticholinergics and Myrbetriq, but she could not tolerate the side effects.  She also completed a 12 weekly series of PTNS and some maintenance PTNS.   She feels that the PTNS may have been helpful.  Patient has incontinence with urgency.   She is experiencing incontinent episodes during the day.  Her incontinence volume is large, soaking through her clothes.   She is wearing 2 to 3 thick pads/depends daily.  She has recently experienced night time incontinence.  The patient has been experiencing urgency x 4-7, frequency x 4-7, not restricting fluids to avoid visits to the restroom, is engaging in toilet mapping, incontinence x 8 or more and nocturia x 0-3.   She does not have a history of urinary tract infections, STI's or injury to the bladder.   She denies dysuria, gross hematuria, suprapubic pain, back pain, abdominal pain or flank pain.  She has not had any recent fevers, chills, nausea or vomiting.  She does not have a history of nephrolithiasis, GU surgery or GU trauma.   She is not sexually active.  She is post menopausal.  She admits to constipation.  She is not having pain with bladder filling.    She has not had any recent imaging studies.   She is drinking only water daily.   Her risk factors for incontinence are age, depression, vaginal atrophy and pelvic surgery.  She is taking antihistamines, decongestants, benzo's, opioids, ACE inhibitors and diuretics.   Serum creatinine 0.76 in 05/2016   Previous Therapy:     She has failed Myrbetriq and anticholinergics   Contraindications present for PTNS      Pacemaker - NO       Implantable defibrillator - NO      History of abnormal  bleeding - NO      History of neuropathies or nerve damage - NO  Discussed with patient possible complications of procedure, such as discomfort, bleeding at insertion/stimulation site, procedure consent signed  Patient goals:     To decrease trips to the bathroom.    Reemphasized that most patient's will see benefit by the 8th week of treatment, but about 10 to 20% of patient may be late responders.  If they happen to be late responders, it is important to continue with the therapy beyond the 12 weekly treatments as many of those patient find benefit.  Symptoms prior to starting PTNS therapy - The patient has been experiencing urgency x 4-7, frequency x 4-7, not restricting fluids to avoid visits to the restroom, is engaging in toilet mapping, incontinence x 8 or more and nocturia x 0-3.    Today, she is complaining of incontinence.   After her sixth treatment, she is experiencing 5 day time voids (stable), 3 night time voids (stable), mild (stable) urgency and no incontinence (stable).  She denies fevers, chills, nausea and vomiting.  She denies dysuria, gross hematuria and suprapubic pain.    PMH: Past Medical History:  Diagnosis Date  . Arthritis   . Bladder incontinence   . Breast cancer ([place]) 1998  . Chronic systolic CHF (congestive heart failure) ([place]) 01/10/2016  a. 09/2015 Echo: EF 40-45%, possible inf HK, mod PAH (PASP 32mmHg).  . Depression   . GERD (gastroesophageal reflux disease)   . History of kidney stones   . HLD (hyperlipidemia)   . Hypertension   . Neuropathy of both feet   . NICM (nonischemic cardiomyopathy) ([place])    a. 08/2015 Lexiscan MV: EF 39%, no ischemia; b. 09/2015 Echo: EF 40-45%.  . Right Humerus head fracture    a. 07/2016 - managed conservatively.  . 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 ABDOMINAL HYSTERECTOMY    . BREAST LUMPECTOMY    . CATARACT EXTRACTION    . CHOLECYSTECTOMY    .  JOINT REPLACEMENT Right    Hip  . KNEE SURGERY Right     Home Medications:  Allergies as of 01/17/2017      Reactions   2,4-d Dimethylamine (amisol) Other (See Comments)   Other Reaction: OTHER REACTION-IRRITATION TO Esophagous   Celebrex [celecoxib] Other (See Comments)    esophagitis Esophageal spasms     Ciprofloxacin Other (See Comments)   Esophageal irritation   Ibuprofen    Other reaction(s): Other (See Comments) reflux   Metronidazole Other (See Comments)   neuropathy   Naproxen Other (See Comments)   GI UPSET   Other Other (See Comments)   Other Reaction: esophagitis      Medication List       Accurate as of 01/17/17 11:04 AM. Always use your most recent med list.          Cholecalciferol 2000 units Tabs Take 1 tablet by mouth daily.   fluticasone 50 MCG/ACT nasal spray Commonly known as:  FLONASE Place 2 sprays into both nostrils at bedtime.   loratadine 10 MG tablet Commonly known as:  CLARITIN Take 1 tablet (10 mg total) by mouth at bedtime.   LORazepam 0.5 MG tablet Commonly known as:  ATIVAN Take 1 tablet (0.5 mg total) by mouth 2 (two) times daily as needed for anxiety.   losartan 100 MG tablet Commonly known as:  COZAAR TAKE 1 TABLET(100 MG) BY MOUTH DAILY   lovastatin 40 MG tablet Commonly known as:  MEVACOR Take 1 tablet (40 mg total) by mouth daily.   metoprolol tartrate 50 MG tablet Commonly known as:  LOPRESSOR TAKE 1 TABLET(50 MG) BY MOUTH TWICE DAILY   Polyethylene Glycol 1450 Liqd POLYETHYLENE GLYCOL (Powder) - Historical Medication  17 grams daily Active   PRESERVISION/LUTEIN Caps Take 1 each by mouth 2 (two) times daily.   traMADol 50 MG tablet Commonly known as:  ULTRAM Take 1 tablet (50 mg total) by mouth 2 (two) times daily.            Discharge Care Instructions        Start     Ordered   01/17/17 0000  PTNS-Percutaneous Tibial Nerve Stimulati    Question:  Porcedure Location  Answer:  [facility]   01/17/17 [phone]      Allergies:  Allergies  Allergen Reactions  . 2,4-D Dimethylamine (Amisol) Other (See Comments)    Other Reaction: OTHER REACTION-IRRITATION TO Esophagous  . Celebrex [Celecoxib] Other (See Comments)     esophagitis Esophageal spasms    . Ciprofloxacin Other (See Comments)    Esophageal irritation  . Ibuprofen     Other reaction(s): Other (See Comments) reflux  . Metronidazole Other (See Comments)    neuropathy  . Naproxen  Other (See Comments)    GI UPSET  . Other Other (See Comments)    Other Reaction: esophagitis    Family History: Family History  Problem Relation Age of Onset  . Stroke Mother   . Diabetes Father   . Heart disease Father   . Kidney disease Neg Hx   . Bladder Cancer Neg Hx   . Kidney cancer Neg Hx     Social History:  reports that she has quit smoking. She has never used smokeless tobacco. She reports that she does not drink alcohol or use drugs.  ROS: UROLOGY Frequent Urination?: No Hard to postpone urination?: No Burning/pain with urination?: No Get up at night to urinate?: Yes Leakage of urine?: Yes Urine stream starts and stops?: No Trouble starting stream?: No Do you have to strain to urinate?: No Blood in urine?: No Urinary tract infection?: No Sexually transmitted disease?: No Injury to kidneys or bladder?: No Painful intercourse?: No Weak stream?: No Currently pregnant?: No Vaginal bleeding?: No Last menstrual period?: n  Gastrointestinal Nausea?: No Vomiting?: No Indigestion/heartburn?: No Diarrhea?: No Constipation?: No  Constitutional Fever: No Night sweats?: No Weight loss?: No Fatigue?: No  Skin Skin rash/lesions?: No Itching?: No  Eyes Blurred vision?: No Double vision?: No  Ears/Nose/Throat Sore throat?: No Sinus problems?: No  Hematologic/Lymphatic Swollen glands?: No Easy bruising?: Yes  Cardiovascular Leg swelling?: No Chest pain?: No  Respiratory Cough?:  No Shortness of breath?: No  Endocrine Excessive thirst?: No  Musculoskeletal Back pain?: No Joint pain?: No  Neurological Headaches?: No Dizziness?: No  Psychologic Depression?: No Anxiety?: No   Physical Exam: BP (!) 166/66   Pulse 71   Ht 5\' 5"  (1.651 m)   Wt 167 lb 1.6 oz (75.8 kg)   BMI 27.81 kg/m   Constitutional: Well nourished. Alert and oriented, No acute distress. HEENT: [place] AT, moist mucus membranes. Trachea midline, no masses. Cardiovascular: No clubbing, cyanosis, or edema. Respiratory: Normal respiratory effort, no increased work of breathing. Skin: No rashes, bruises or suspicious lesions. Lymph: No cervical or inguinal adenopathy. Neurologic: Grossly intact, no focal deficits, moving all 4 extremities. Psychiatric: Normal mood and affect.  Laboratory Data: Lab Results  Component Value Date   WBC 7.4 01/23/2016   HGB 14.0 01/23/2016   HCT 40.1 01/23/2016   MCV 89.7 01/23/2016   PLT 236 01/23/2016    Lab Results  Component Value Date   CREATININE 0.76 05/17/2016    Lab Results  Component Value Date   HGBA1C 5.4 02/14/2016       Component Value Date/Time   CHOL 147 10/11/2016 0804   CHOL 131 06/04/2015 0810   HDL 48 (L) 10/11/2016 0804   HDL 54 06/04/2015 0810   CHOLHDL 3.1 10/11/2016 0804   VLDL 29 10/11/2016 0804   LDLCALC 70 10/11/2016 0804   LDLCALC 53 06/04/2015 0810    Lab Results  Component Value Date   AST 15 05/17/2016   Lab Results  Component Value Date   ALT 10 05/17/2016   I have reviewed the labs.    PTNS treatment: The needle electrode was inserted into the lower, inner aspect of the patient's right leg. The surface electrode was placed on the inside arch of the foot on the treatment leg. The lead set was connected to the stimulator and the needle electrode clip was connected to the needle electrode. The stimulator that produces an adjustable electrical pulse that travels to the sacral nerve plexus via the  tibial nerve was  increased to 2 until the patient received a toe flex and sensory response.     Assessment & Plan:   1. Urge incontinence  Treatment Plan:  The needle electrode was removed without difficulty to the patient.  Patient tolerated the procedure for 30 minutes.  She will return next week for # 7 out of 12 of their weekly PTNS treatment's  Return in about 1 week (around 01/24/2017) for # 7 PTNS.  These notes generated with voice recognition software. I apologize for typographical errors.  Zara Council, [facility] [address], [place], [place] [address] [phone]

## 2017-01-17 ENCOUNTER — Ambulatory Visit (INDEPENDENT_AMBULATORY_CARE_PROVIDER_SITE_OTHER): Payer: Medicare Other | Admitting: Urology

## 2017-01-17 ENCOUNTER — Encounter: Payer: Self-pay | Admitting: Urology

## 2017-01-17 VITALS — BP 166/66 | HR 71 | Ht 65.0 in | Wt 167.1 lb

## 2017-01-17 DIAGNOSIS — N3941 Urge incontinence: Secondary | ICD-10-CM | POA: Diagnosis not present

## 2017-01-17 NOTE — Progress Notes (Signed)
PTNS  Session # 7  Health & Social Factors: No vChange Caffeine: 1 Alcohol: 0 Daytime voids #per day: 5 Night-time voids #per night: 3 Urgency: Mild Incontinence Episodes #per day: 0 Ankle used: Right Treatment Setting: 2 Feeling/ Response: Both Comments: States getting better  Preformed By: Zara Council PA-C  Assistant: Lyndee Hensen CMA  Follow Up: One week

## 2017-01-23 NOTE — Progress Notes (Signed)
Chief Complaint:  Chief Complaint  Patient presents with  . PTNS    urge incontinence     HPI: 83 yo WF who presents today to begin PTNS therapy.  This will be # 8/12 weekly treatments.  Background history    Patient stated that she has had urinary incontinence for several years.  She had been tried on anticholinergics and Myrbetriq, but she could not tolerate the side effects.  She also completed a 12 weekly series of PTNS and some maintenance PTNS.   She feels that the PTNS may have been helpful.  Patient has incontinence with urgency.   She is experiencing incontinent episodes during the day.  Her incontinence volume is large, soaking through her clothes.   She is wearing 2 to 3 thick pads/depends daily.  She has recently experienced night time incontinence.  The patient has been experiencing urgency x 4-7, frequency x 4-7, not restricting fluids to avoid visits to the restroom, is engaging in toilet mapping, incontinence x 8 or more and nocturia x 0-3.   She does not have a history of urinary tract infections, STI's or injury to the bladder.   She denies dysuria, gross hematuria, suprapubic pain, back pain, abdominal pain or flank pain.  She has not had any recent fevers, chills, nausea or vomiting.  She does not have a history of nephrolithiasis, GU surgery or GU trauma.   She is not sexually active.  She is post menopausal.  She admits to constipation.  She is not having pain with bladder filling.    She has not had any recent imaging studies.   She is drinking only water daily.   Her risk factors for incontinence are age, depression, vaginal atrophy and pelvic surgery.  She is taking antihistamines, decongestants, benzo's, opioids, ACE inhibitors and diuretics.   Serum creatinine 0.76 in 05/2016   Previous Therapy:     She has failed Myrbetriq and anticholinergics   Contraindications present for PTNS      Pacemaker - NO       Implantable defibrillator - NO      History of abnormal  bleeding - NO      History of neuropathies or nerve damage - NO  Discussed with patient possible complications of procedure, such as discomfort, bleeding at insertion/stimulation site, procedure consent signed  Patient goals:     To decrease trips to the bathroom.    Reemphasized that most patient's will see benefit by the 8th week of treatment, but about 10 to 20% of patient may be late responders.  If they happen to be late responders, it is important to continue with the therapy beyond the 12 weekly treatments as many of those patient find benefit.  Symptoms prior to starting PTNS therapy - The patient has been experiencing urgency x 4-7, frequency x 4-7, not restricting fluids to avoid visits to the restroom, is engaging in toilet mapping, incontinence x 8 or more and nocturia x 0-3.    Today, she is complaining of incontinence.   After her seventh treatment, she is experiencing 5 day time voids (stable), 4 night time voids (worse), mild (stable) urgency and no incontinence (stable).  She denies fevers, chills, nausea and vomiting.  She denies dysuria, gross hematuria and suprapubic pain.    She expresses frustration with her nocturia. She states some night she is not getting up at all and other night she is getting up 4 times.  PMH: Past Medical History:  Diagnosis Date  .  Arthritis   . Bladder incontinence   . Breast cancer ([place]) 1998  . Chronic systolic CHF (congestive heart failure) ([place]) 01/10/2016   a. 09/2015 Echo: EF 40-45%, possible inf HK, mod PAH (PASP 45mmHg).  . Depression   . GERD (gastroesophageal reflux disease)   . History of kidney stones   . HLD (hyperlipidemia)   . Hypertension   . Neuropathy of both feet   . NICM (nonischemic cardiomyopathy) ([place])    a. 08/2015 Lexiscan MV: EF 39%, no ischemia; b. 09/2015 Echo: EF 40-45%.  . Right Humerus head fracture    a. 07/2016 - managed conservatively.  . 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 ABDOMINAL HYSTERECTOMY    . BREAST LUMPECTOMY    . CATARACT EXTRACTION    . CHOLECYSTECTOMY    . JOINT REPLACEMENT Right    Hip  . KNEE SURGERY Right     Home Medications:  Allergies as of 01/24/2017      Reactions   2,4-d Dimethylamine (amisol) Other (See Comments)   Other Reaction: OTHER REACTION-IRRITATION TO Esophagous   Celebrex [celecoxib] Other (See Comments)    esophagitis Esophageal spasms     Ciprofloxacin Other (See Comments)   Esophageal irritation   Ibuprofen    Other reaction(s): Other (See Comments) reflux   Metronidazole Other (See Comments)   neuropathy   Naproxen Other (See Comments)   GI UPSET   Other Other (See Comments)   Other Reaction: esophagitis      Medication List       Accurate as of 01/24/17 10:01 AM. Always use your most recent med list.          amLODipine 5 MG tablet Commonly known as:  NORVASC 5 mg.   Cholecalciferol 2000 units Tabs Take 1 tablet by mouth daily.   fluticasone 50 MCG/ACT nasal spray Commonly known as:  FLONASE Place 2 sprays into both nostrils at bedtime.   loratadine 10 MG tablet Commonly known as:  CLARITIN Take 1 tablet (10 mg total) by mouth at bedtime.   LORazepam 0.5 MG tablet Commonly known as:  ATIVAN Take 1 tablet (0.5 mg total) by mouth 2 (two) times daily as needed for anxiety.   losartan 100 MG tablet Commonly known as:  COZAAR TAKE 1 TABLET(100 MG) BY MOUTH DAILY   lovastatin 40 MG tablet Commonly known as:  MEVACOR Take 1 tablet (40 mg total) by mouth daily.   metoprolol tartrate 50 MG tablet Commonly known as:  LOPRESSOR TAKE 1 TABLET(50 MG) BY MOUTH TWICE DAILY   Polyethylene Glycol 1450 Liqd POLYETHYLENE GLYCOL (Powder) - Historical Medication  17 grams daily Active   PRESERVISION/LUTEIN Caps Take 1 each by mouth 2 (two) times daily.   traMADol 50 MG tablet Commonly known as:  ULTRAM Take 1 tablet (50 mg total) by mouth 2 (two)  times daily.            Discharge Care Instructions        Start     Ordered   01/24/17 0000  PTNS-Percutaneous Tibial Nerve Stimulati    Question:  Porcedure Location  Answer:  [facility]   01/24/17 0825      Allergies:  Allergies  Allergen Reactions  . 2,4-D Dimethylamine (Amisol) Other (See Comments)    Other Reaction: OTHER REACTION-IRRITATION TO Esophagous  . Celebrex [Celecoxib] Other (See Comments)     esophagitis Esophageal spasms    .  Ciprofloxacin Other (See Comments)    Esophageal irritation  . Ibuprofen     Other reaction(s): Other (See Comments) reflux  . Metronidazole Other (See Comments)    neuropathy  . Naproxen Other (See Comments)    GI UPSET  . Other Other (See Comments)    Other Reaction: esophagitis    Family History: Family History  Problem Relation Age of Onset  . Stroke Mother   . Diabetes Father   . Heart disease Father   . Kidney disease Neg Hx   . Bladder Cancer Neg Hx   . Kidney cancer Neg Hx     Social History:  reports that she has quit smoking. She has never used smokeless tobacco. She reports that she does not drink alcohol or use drugs.  ROS: UROLOGY Frequent Urination?: No Hard to postpone urination?: No Burning/pain with urination?: No Get up at night to urinate?: Yes Leakage of urine?: Yes Urine stream starts and stops?: No Trouble starting stream?: No Do you have to strain to urinate?: No Blood in urine?: No Urinary tract infection?: No Sexually transmitted disease?: No Injury to kidneys or bladder?: No Painful intercourse?: No Weak stream?: No Currently pregnant?: No Vaginal bleeding?: No Last menstrual period?: n  Gastrointestinal Nausea?: No Vomiting?: No Indigestion/heartburn?: No Diarrhea?: No Constipation?: No  Constitutional Fever: No Night sweats?: No Weight loss?: No Fatigue?: No  Skin Skin rash/lesions?: No Itching?: No  Eyes Blurred vision?: No Double vision?:  No  Ears/Nose/Throat Sore throat?: No Sinus problems?: No  Hematologic/Lymphatic Swollen glands?: No Easy bruising?: No  Cardiovascular Leg swelling?: No Chest pain?: No  Respiratory Cough?: No Shortness of breath?: No  Endocrine Excessive thirst?: No  Musculoskeletal Back pain?: No Joint pain?: No  Neurological Headaches?: No Dizziness?: No  Psychologic Depression?: No Anxiety?: No   Physical Exam: BP (!) 168/83   Pulse 62   Ht 5\' 2"  (1.575 m)   Wt 168 lb 4.8 oz (76.3 kg)   BMI 30.78 kg/m   Constitutional: Well nourished. Alert and oriented, No acute distress. HEENT: [place] AT, moist mucus membranes. Trachea midline, no masses. Cardiovascular: No clubbing, cyanosis, or edema. Respiratory: Normal respiratory effort, no increased work of breathing. Skin: No rashes, bruises or suspicious lesions. Lymph: No cervical or inguinal adenopathy. Neurologic: Grossly intact, no focal deficits, moving all 4 extremities. Psychiatric: Normal mood and affect.  Laboratory Data: Lab Results  Component Value Date   WBC 7.4 01/23/2016   HGB 14.0 01/23/2016   HCT 40.1 01/23/2016   MCV 89.7 01/23/2016   PLT 236 01/23/2016    Lab Results  Component Value Date   CREATININE 0.76 05/17/2016    Lab Results  Component Value Date   HGBA1C 5.4 02/14/2016       Component Value Date/Time   CHOL 147 10/11/2016 0804   CHOL 131 06/04/2015 0810   HDL 48 (L) 10/11/2016 0804   HDL 54 06/04/2015 0810   CHOLHDL 3.1 10/11/2016 0804   VLDL 29 10/11/2016 0804   LDLCALC 70 10/11/2016 0804   LDLCALC 53 06/04/2015 0810    Lab Results  Component Value Date   AST 15 05/17/2016   Lab Results  Component Value Date   ALT 10 05/17/2016   I have reviewed the labs.    PTNS treatment: The needle electrode was inserted into the lower, inner aspect of the patient's right leg. The surface electrode was placed on the inside arch of the foot on the treatment leg. The lead set was  connected to the stimulator and the needle electrode clip was connected to the needle electrode. The stimulator that produces an adjustable electrical pulse that travels to the sacral nerve plexus via the tibial nerve was increased to 9 until the patient received a toe flex and sensory response.     Assessment & Plan:   1. Urge incontinence  Treatment Plan:  The needle electrode was removed without difficulty to the patient.  Patient tolerated the procedure for 30 minutes.  She will return next week for # 9 out of 12 of their weekly PTNS treatment's  2. Nocturia  - I suggested that the patient keep a bladder diary writing down what she eats and drinks an times and her voiding pattern  - She will bring her diet with her next week for our review  Return in about 1 week (around 01/31/2017) for # 9/12 PTNS.  These notes generated with voice recognition software. I apologize for typographical errors.  Zara Council, [facility] [address] [place], [place] [address] [phone]

## 2017-01-24 ENCOUNTER — Encounter: Payer: Self-pay | Admitting: Urology

## 2017-01-24 ENCOUNTER — Ambulatory Visit (INDEPENDENT_AMBULATORY_CARE_PROVIDER_SITE_OTHER): Payer: Medicare Other | Admitting: Urology

## 2017-01-24 VITALS — BP 168/83 | HR 62 | Ht 62.0 in | Wt 168.3 lb

## 2017-01-24 DIAGNOSIS — N3941 Urge incontinence: Secondary | ICD-10-CM

## 2017-01-24 DIAGNOSIS — R351 Nocturia: Secondary | ICD-10-CM

## 2017-01-24 NOTE — Progress Notes (Signed)
PTNS  Session # 8  Health & Social Factors: No Change Caffeine: 1 Alcohol: 0 Daytime voids #per day: 5 Night-time voids #per night: 4 Urgency: Mild Incontinence Episodes #per day: 0 Ankle used: Right Treatment Setting: 9 Feeling/ Response: Both  Preformed By: Zara Council PA-C  Follow Up: One week

## 2017-01-30 NOTE — Progress Notes (Signed)
Chief Complaint:  Chief Complaint  Patient presents with  . PTNS    mixed incontinence     HPI: 83 yo WF who presents today to begin PTNS therapy.  This will be # 9/12 weekly treatments.  Background history    Patient stated that she has had urinary incontinence for several years.  She had been tried on anticholinergics and Myrbetriq, but she could not tolerate the side effects.  She also completed a 12 weekly series of PTNS and some maintenance PTNS.   She feels that the PTNS may have been helpful.  Patient has incontinence with urgency.   She is experiencing incontinent episodes during the day.  Her incontinence volume is large, soaking through her clothes.   She is wearing 2 to 3 thick pads/depends daily.  She has recently experienced night time incontinence.  The patient has been experiencing urgency x 4-7, frequency x 4-7, not restricting fluids to avoid visits to the restroom, is engaging in toilet mapping, incontinence x 8 or more and nocturia x 0-3.   She does not have a history of urinary tract infections, STI's or injury to the bladder.   She denies dysuria, gross hematuria, suprapubic pain, back pain, abdominal pain or flank pain.  She has not had any recent fevers, chills, nausea or vomiting.  She does not have a history of nephrolithiasis, GU surgery or GU trauma.   She is not sexually active.  She is post menopausal.  She admits to constipation.  She is not having pain with bladder filling.    She has not had any recent imaging studies.   She is drinking only water daily.   Her risk factors for incontinence are age, depression, vaginal atrophy and pelvic surgery.  She is taking antihistamines, decongestants, benzo's, opioids, ACE inhibitors and diuretics.   Serum creatinine 0.76 in 05/2016   Previous Therapy:     She has failed Myrbetriq and anticholinergics   Contraindications present for PTNS      Pacemaker - NO       Implantable defibrillator - NO      History of abnormal  bleeding - NO      History of neuropathies or nerve damage - NO  Discussed with patient possible complications of procedure, such as discomfort, bleeding at insertion/stimulation site, procedure consent signed  Patient goals:     To decrease trips to the bathroom.    Reemphasized that most patient's will see benefit by the 8th week of treatment, but about 10 to 20% of patient may be late responders.  If they happen to be late responders, it is important to continue with the therapy beyond the 12 weekly treatments as many of those patient find benefit.  Symptoms prior to starting PTNS therapy - The patient has been experiencing urgency x 4-7, frequency x 4-7, not restricting fluids to avoid visits to the restroom, is engaging in toilet mapping, incontinence x 8 or more and nocturia x 0-3.    Today, she is complaining of early morning urgency.   After her eight treatment, she is experiencing 5 day time voids (stable), 4 night time voids (stable), mild (stable) urgency and no incontinence (stable).  She denies fevers, chills, nausea and vomiting.  She denies dysuria, gross hematuria and suprapubic pain.     PMH: Past Medical History:  Diagnosis Date  . Arthritis   . Bladder incontinence   . Breast cancer ([place]) 1998  . Chronic systolic CHF (congestive heart failure) ([place])  01/10/2016   a. 09/2015 Echo: EF 40-45%, possible inf HK, mod PAH (PASP 56mmHg).  . Depression   . GERD (gastroesophageal reflux disease)   . History of kidney stones   . HLD (hyperlipidemia)   . Hypertension   . Neuropathy of both feet   . NICM (nonischemic cardiomyopathy) ([place])    a. 08/2015 Lexiscan MV: EF 39%, no ischemia; b. 09/2015 Echo: EF 40-45%.  . Right Humerus head fracture    a. 07/2016 - managed conservatively.  . 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 ABDOMINAL HYSTERECTOMY    . BREAST LUMPECTOMY    . CATARACT EXTRACTION    .  CHOLECYSTECTOMY    . JOINT REPLACEMENT Right    Hip  . KNEE SURGERY Right     Home Medications:  Allergies as of 01/31/2017      Reactions   2,4-d Dimethylamine (amisol) Other (See Comments)   Other Reaction: OTHER REACTION-IRRITATION TO Esophagous   Celebrex [celecoxib] Other (See Comments)    esophagitis Esophageal spasms     Ciprofloxacin Other (See Comments)   Esophageal irritation   Ibuprofen    Other reaction(s): Other (See Comments) reflux   Metronidazole Other (See Comments)   neuropathy   Naproxen Other (See Comments)   GI UPSET   Other Other (See Comments)   Other Reaction: esophagitis      Medication List       Accurate as of 01/31/17 11:09 AM. Always use your most recent med list.          amLODipine 5 MG tablet Commonly known as:  NORVASC 5 mg.   Cholecalciferol 2000 units Tabs Take 1 tablet by mouth daily.   fluticasone 50 MCG/ACT nasal spray Commonly known as:  FLONASE Place 2 sprays into both nostrils at bedtime.   loratadine 10 MG tablet Commonly known as:  CLARITIN Take 1 tablet (10 mg total) by mouth at bedtime.   LORazepam 0.5 MG tablet Commonly known as:  ATIVAN Take 1 tablet (0.5 mg total) by mouth 2 (two) times daily as needed for anxiety.   losartan 100 MG tablet Commonly known as:  COZAAR TAKE 1 TABLET(100 MG) BY MOUTH DAILY   lovastatin 40 MG tablet Commonly known as:  MEVACOR Take 1 tablet (40 mg total) by mouth daily.   metoprolol tartrate 50 MG tablet Commonly known as:  LOPRESSOR TAKE 1 TABLET(50 MG) BY MOUTH TWICE DAILY   Polyethylene Glycol 1450 Liqd POLYETHYLENE GLYCOL (Powder) - Historical Medication  17 grams daily Active   PRESERVISION/LUTEIN Caps Take 1 each by mouth 2 (two) times daily.   traMADol 50 MG tablet Commonly known as:  ULTRAM Take 1 tablet (50 mg total) by mouth 2 (two) times daily.            Discharge Care Instructions        Start     Ordered   01/31/17 0000  PTNS-Percutaneous  Tibial Nerve Stimulati    Question:  Porcedure Location  Answer:  [facility]   01/31/17 [phone]      Allergies:  Allergies  Allergen Reactions  . 2,4-D Dimethylamine (Amisol) Other (See Comments)    Other Reaction: OTHER REACTION-IRRITATION TO Esophagous  . Celebrex [Celecoxib] Other (See Comments)     esophagitis Esophageal spasms    . Ciprofloxacin Other (See Comments)    Esophageal irritation  . Ibuprofen     Other reaction(s): Other (  See Comments) reflux  . Metronidazole Other (See Comments)    neuropathy  . Naproxen Other (See Comments)    GI UPSET  . Other Other (See Comments)    Other Reaction: esophagitis    Family History: Family History  Problem Relation Age of Onset  . Stroke Mother   . Diabetes Father   . Heart disease Father   . Kidney disease Neg Hx   . Bladder Cancer Neg Hx   . Kidney cancer Neg Hx     Social History:  reports that she has quit smoking. She has never used smokeless tobacco. She reports that she does not drink alcohol or use drugs.  ROS: UROLOGY Frequent Urination?: No Hard to postpone urination?: No Burning/pain with urination?: No Get up at night to urinate?: Yes Leakage of urine?: Yes Urine stream starts and stops?: No Trouble starting stream?: No Do you have to strain to urinate?: No Blood in urine?: No Urinary tract infection?: No Sexually transmitted disease?: No Injury to kidneys or bladder?: No Painful intercourse?: No Weak stream?: No Currently pregnant?: No Vaginal bleeding?: No Last menstrual period?: n  Gastrointestinal Nausea?: No Vomiting?: No Indigestion/heartburn?: No Diarrhea?: No Constipation?: No  Constitutional Fever: No Night sweats?: No Weight loss?: No Fatigue?: No  Skin Skin rash/lesions?: No Itching?: No  Eyes Blurred vision?: No Double vision?: No  Ears/Nose/Throat Sore throat?: No Sinus problems?: Yes  Hematologic/Lymphatic Swollen glands?: No Easy  bruising?: No  Cardiovascular Leg swelling?: No Chest pain?: No  Respiratory Cough?: No Shortness of breath?: No  Endocrine Excessive thirst?: No  Musculoskeletal Back pain?: Yes Joint pain?: Yes  Neurological Headaches?: No Dizziness?: No  Psychologic Depression?: No Anxiety?: No   Physical Exam: BP (!) 172/64   Pulse 67   Ht 5\' 2"  (1.575 m)   Wt 167 lb 12.8 oz (76.1 kg)   BMI 30.69 kg/m   Constitutional: Well nourished. Alert and oriented, No acute distress. HEENT: [place] AT, moist mucus membranes. Trachea midline, no masses. Cardiovascular: No clubbing, cyanosis, or edema. Respiratory: Normal respiratory effort, no increased work of breathing. Skin: No rashes, bruises or suspicious lesions. Lymph: No cervical or inguinal adenopathy. Neurologic: Grossly intact, no focal deficits, moving all 4 extremities. Psychiatric: Normal mood and affect.  Laboratory Data: Lab Results  Component Value Date   WBC 7.4 01/23/2016   HGB 14.0 01/23/2016   HCT 40.1 01/23/2016   MCV 89.7 01/23/2016   PLT 236 01/23/2016    Lab Results  Component Value Date   CREATININE 0.76 05/17/2016    Lab Results  Component Value Date   HGBA1C 5.4 02/14/2016       Component Value Date/Time   CHOL 147 10/11/2016 0804   CHOL 131 06/04/2015 0810   HDL 48 (L) 10/11/2016 0804   HDL 54 06/04/2015 0810   CHOLHDL 3.1 10/11/2016 0804   VLDL 29 10/11/2016 0804   LDLCALC 70 10/11/2016 0804   LDLCALC 53 06/04/2015 0810    Lab Results  Component Value Date   AST 15 05/17/2016   Lab Results  Component Value Date   ALT 10 05/17/2016   I have reviewed the labs.    PTNS treatment: The needle electrode was inserted into the lower, inner aspect of the patient's right leg. The surface electrode was placed on the inside arch of the foot on the treatment leg. The lead set was connected to the stimulator and the needle electrode clip was connected to the needle electrode. The stimulator  that produces  an adjustable electrical pulse that travels to the sacral nerve plexus via the tibial nerve was increased to 9 until the patient received a toe flex and sensory response.     Assessment & Plan:   1. Urge incontinence  Treatment Plan:  The needle electrode was removed without difficulty to the patient.  Patient tolerated the procedure for 30 minutes.  She will return next week for # 10 out of 12 of their weekly PTNS treatment's  2. Nocturia  - I suggested that the patient keep a bladder diary writing down what she eats and drinks an times and her voiding pattern - she did not keep a diary  - discussed avoiding fluids after 6, avoiding caffeine, a low-salt diet and the possibility of sleep apnea  Return in about 1 week (around 02/07/2017).  These notes generated with voice recognition software. I apologize for typographical errors.  Zara Council, [facility] [address] [place], [place] [address] [phone]

## 2017-01-31 ENCOUNTER — Ambulatory Visit (INDEPENDENT_AMBULATORY_CARE_PROVIDER_SITE_OTHER): Payer: Medicare Other | Admitting: Urology

## 2017-01-31 ENCOUNTER — Encounter: Payer: Self-pay | Admitting: Urology

## 2017-01-31 VITALS — BP 172/64 | HR 67 | Ht 62.0 in | Wt 167.8 lb

## 2017-01-31 DIAGNOSIS — N3941 Urge incontinence: Secondary | ICD-10-CM | POA: Diagnosis not present

## 2017-01-31 DIAGNOSIS — R351 Nocturia: Secondary | ICD-10-CM

## 2017-01-31 NOTE — Progress Notes (Signed)
PTNS  Session # 9  Health & Social Factors: No Change Caffeine: 1 Alcohol: 0 Daytime voids #per day: 5 Night-time voids #per night: 4 Urgency: Mild Incontinence Episodes #per day: 0 Ankle used: Right Treatment Setting: 19 Feeling/ Response: Toe Flex  Preformed By: Zara Council PA-C  Follow Up: One week

## 2017-02-06 NOTE — Progress Notes (Signed)
Chief Complaint:  Chief Complaint  Patient presents with  . PTNS    #10     HPI: 83 yo WF who presents today to begin PTNS therapy.  This will be # 10/12 weekly treatments.  Background history    Patient stated that she has had urinary incontinence for several years.  She had been tried on anticholinergics and Myrbetriq, but she could not tolerate the side effects.  She also completed a 12 weekly series of PTNS and some maintenance PTNS.   She feels that the PTNS may have been helpful.  Patient has incontinence with urgency.   She is experiencing incontinent episodes during the day.  Her incontinence volume is large, soaking through her clothes.   She is wearing 2 to 3 thick pads/depends daily.  She has recently experienced night time incontinence.  The patient has been experiencing urgency x 4-7, frequency x 4-7, not restricting fluids to avoid visits to the restroom, is engaging in toilet mapping, incontinence x 8 or more and nocturia x 0-3.   She does not have a history of urinary tract infections, STI's or injury to the bladder.   She denies dysuria, gross hematuria, suprapubic pain, back pain, abdominal pain or flank pain.  She has not had any recent fevers, chills, nausea or vomiting.  She does not have a history of nephrolithiasis, GU surgery or GU trauma.   She is not sexually active.  She is post menopausal.  She admits to constipation.  She is not having pain with bladder filling.    She has not had any recent imaging studies.   She is drinking only water daily.   Her risk factors for incontinence are age, depression, vaginal atrophy and pelvic surgery.  She is taking antihistamines, decongestants, benzo's, opioids, ACE inhibitors and diuretics.   Serum creatinine 0.76 in 05/2016   Previous Therapy:     She has failed Myrbetriq and anticholinergics   Contraindications present for PTNS      Pacemaker - NO       Implantable defibrillator - NO      History of abnormal bleeding -  NO      History of neuropathies or nerve damage - NO  Discussed with patient possible complications of procedure, such as discomfort, bleeding at insertion/stimulation site, procedure consent signed  Patient goals:     To decrease trips to the bathroom.    Reemphasized that most patient's will see benefit by the 8th week of treatment, but about 10 to 20% of patient may be late responders.  If they happen to be late responders, it is important to continue with the therapy beyond the 12 weekly treatments as many of those patient find benefit.  Symptoms prior to starting PTNS therapy - The patient has been experiencing urgency x 4-7, frequency x 4-7, not restricting fluids to avoid visits to the restroom, is engaging in toilet mapping, incontinence x 8 or more and nocturia x 0-3.    Today, she is complaining of early morning urgency.   After her ninth treatment, she is experiencing 5 day time voids (stable), 2-3 night time voids (improved), no (improved) urgency and no incontinence (stable).  She denies fevers, chills, nausea and vomiting.  She denies dysuria, gross hematuria and suprapubic pain.     PMH: Past Medical History:  Diagnosis Date  . Arthritis   . Bladder incontinence   . Breast cancer ([place]) 1998  . Chronic systolic CHF (congestive heart failure) ([place]) 01/10/2016  a. 09/2015 Echo: EF 40-45%, possible inf HK, mod PAH (PASP 21mmHg).  . Depression   . GERD (gastroesophageal reflux disease)   . History of kidney stones   . HLD (hyperlipidemia)   . Hypertension   . Neuropathy of both feet   . NICM (nonischemic cardiomyopathy) ([facility])    a. 08/2015 Lexiscan MV: EF 39%, no ischemia; b. 09/2015 Echo: EF 40-45%.  . Right Humerus head fracture    a. 07/2016 - managed conservatively.  . 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 ABDOMINAL HYSTERECTOMY    . BREAST LUMPECTOMY    . CATARACT EXTRACTION    . CHOLECYSTECTOMY     . JOINT REPLACEMENT Right    Hip  . KNEE SURGERY Right     Home Medications:  Allergies as of 02/07/2017      Reactions   2,4-d Dimethylamine (amisol) Other (See Comments)   Other Reaction: OTHER REACTION-IRRITATION TO Esophagous   Celebrex [celecoxib] Other (See Comments)    esophagitis Esophageal spasms     Ciprofloxacin Other (See Comments)   Esophageal irritation   Ibuprofen    Other reaction(s): Other (See Comments) reflux   Metronidazole Other (See Comments)   neuropathy   Naproxen Other (See Comments)   GI UPSET   Other Other (See Comments)   Other Reaction: esophagitis      Medication List       Accurate as of 02/07/17 12:05 PM. Always use your most recent med list.          amLODipine 5 MG tablet Commonly known as:  NORVASC 5 mg.   Cholecalciferol 2000 units Tabs Take 1 tablet by mouth daily.   fluticasone 50 MCG/ACT nasal spray Commonly known as:  FLONASE Place 2 sprays into both nostrils at bedtime.   loratadine 10 MG tablet Commonly known as:  CLARITIN Take 1 tablet (10 mg total) by mouth at bedtime.   LORazepam 0.5 MG tablet Commonly known as:  ATIVAN Take 1 tablet (0.5 mg total) by mouth 2 (two) times daily as needed for anxiety.   losartan 100 MG tablet Commonly known as:  COZAAR TAKE 1 TABLET(100 MG) BY MOUTH DAILY   lovastatin 40 MG tablet Commonly known as:  MEVACOR Take 1 tablet (40 mg total) by mouth daily.   metoprolol tartrate 50 MG tablet Commonly known as:  LOPRESSOR TAKE 1 TABLET(50 MG) BY MOUTH TWICE DAILY   Polyethylene Glycol 1450 Liqd POLYETHYLENE GLYCOL (Powder) - Historical Medication  17 grams daily Active   PRESERVISION/LUTEIN Caps Take 1 each by mouth 2 (two) times daily.   traMADol 50 MG tablet Commonly known as:  ULTRAM Take 1 tablet (50 mg total) by mouth 2 (two) times daily.            Discharge Care Instructions        Start     Ordered   02/07/17 0000  PTNS-Percutaneous Tibial Nerve Stimulati     Question:  Porcedure Location  Answer:  [facility]   02/07/17 9371   02/07/17 0000  PTNS-Percutaneous Tibial Nerve Stimulati    Question:  Porcedure Location  Answer:  [facility]   02/07/17 1103      Allergies:  Allergies  Allergen Reactions  . 2,4-D Dimethylamine (Amisol) Other (See Comments)    Other Reaction: OTHER REACTION-IRRITATION TO Esophagous  . Celebrex [Celecoxib] Other (See Comments)     esophagitis Esophageal spasms    .  Ciprofloxacin Other (See Comments)    Esophageal irritation  . Ibuprofen     Other reaction(s): Other (See Comments) reflux  . Metronidazole Other (See Comments)    neuropathy  . Naproxen Other (See Comments)    GI UPSET  . Other Other (See Comments)    Other Reaction: esophagitis    Family History: Family History  Problem Relation Age of Onset  . Stroke Mother   . Diabetes Father   . Heart disease Father   . Kidney disease Neg Hx   . Bladder Cancer Neg Hx   . Kidney cancer Neg Hx     Social History:  reports that she has quit smoking. She has never used smokeless tobacco. She reports that she does not drink alcohol or use drugs.  ROS: UROLOGY Frequent Urination?: No Hard to postpone urination?: No Burning/pain with urination?: No Get up at night to urinate?: Yes Leakage of urine?: Yes Urine stream starts and stops?: No Trouble starting stream?: No Do you have to strain to urinate?: No Blood in urine?: No Urinary tract infection?: No Sexually transmitted disease?: No Injury to kidneys or bladder?: No Painful intercourse?: No Weak stream?: No Currently pregnant?: No Vaginal bleeding?: No Last menstrual period?: n  Gastrointestinal Nausea?: No Vomiting?: No Indigestion/heartburn?: No Diarrhea?: No Constipation?: No  Constitutional Fever: No Night sweats?: No Weight loss?: No Fatigue?: No  Skin Skin rash/lesions?: No Itching?: No  Eyes Blurred vision?: No Double  vision?: No  Ears/Nose/Throat Sore throat?: No Sinus problems?: Yes  Hematologic/Lymphatic Swollen glands?: No Easy bruising?: No  Cardiovascular Leg swelling?: No Chest pain?: No  Respiratory Cough?: No Shortness of breath?: No  Endocrine Excessive thirst?: No  Musculoskeletal Back pain?: No Joint pain?: No  Neurological Headaches?: No Dizziness?: No  Psychologic Depression?: No Anxiety?: No   Physical Exam: BP (!) 167/77   Pulse 72   Ht 5\' 5"  (1.651 m)   Wt 166 lb (75.3 kg)   BMI 27.62 kg/m   Constitutional: Well nourished. Alert and oriented, No acute distress. HEENT: [place] AT, moist mucus membranes. Trachea midline, no masses. Cardiovascular: No clubbing, cyanosis, or edema. Respiratory: Normal respiratory effort, no increased work of breathing. Skin: No rashes, bruises or suspicious lesions. Lymph: No cervical or inguinal adenopathy. Neurologic: Grossly intact, no focal deficits, moving all 4 extremities. Psychiatric: Normal mood and affect.  Laboratory Data: Lab Results  Component Value Date   CREATININE 0.76 05/17/2016    Lab Results  Component Value Date   HGBA1C 5.4 02/14/2016       Component Value Date/Time   CHOL 147 10/11/2016 0804   CHOL 131 06/04/2015 0810   HDL 48 (L) 10/11/2016 0804   HDL 54 06/04/2015 0810   CHOLHDL 3.1 10/11/2016 0804   VLDL 29 10/11/2016 0804   LDLCALC 70 10/11/2016 0804   LDLCALC 53 06/04/2015 0810    Lab Results  Component Value Date   AST 15 05/17/2016   Lab Results  Component Value Date   ALT 10 05/17/2016   I have reviewed the labs.    PTNS treatment: The needle electrode was inserted into the lower, inner aspect of the patient's right leg. The surface electrode was placed on the inside arch of the foot on the treatment leg. The lead set was connected to the stimulator and the needle electrode clip was connected to the needle electrode. The stimulator that produces an adjustable electrical  pulse that travels to the sacral nerve plexus via the tibial nerve was increased  to 8 until the patient received a toe flex.   Assessment & Plan:   1. Urge incontinence  Treatment Plan:  The needle electrode was removed without difficulty to the patient.  Patient tolerated the procedure for 30 minutes.  She will return next week for # 11 out of 12 of their weekly PTNS treatment's  2. Nocturia  - I suggested that the patient keep a bladder diary writing down what she eats and drinks an times and her voiding pattern - she did keep a diary - but she did not record fluid volumes  - discussed avoiding fluids after 6, avoiding caffeine, a low-salt diet and the possibility of sleep apnea  Return for # 11 PTNS.  These notes generated with voice recognition software. I apologize for typographical errors.  Zara Council, [facility] [address] [place],  [address] [phone]

## 2017-02-07 ENCOUNTER — Ambulatory Visit (INDEPENDENT_AMBULATORY_CARE_PROVIDER_SITE_OTHER): Payer: Medicare Other | Admitting: Urology

## 2017-02-07 ENCOUNTER — Encounter: Payer: Self-pay | Admitting: Urology

## 2017-02-07 VITALS — BP 167/77 | HR 72 | Ht 65.0 in | Wt 166.0 lb

## 2017-02-07 DIAGNOSIS — N3941 Urge incontinence: Secondary | ICD-10-CM

## 2017-02-07 DIAGNOSIS — R351 Nocturia: Secondary | ICD-10-CM

## 2017-02-07 NOTE — Progress Notes (Signed)
PTNS  Session # 10  Health & Social Factors: No change Caffeine: 0 Alcohol: 0 Daytime voids #per day: 4-5 Night-time voids #per night: 2-3 Urgency: None Incontinence Episodes #per day: 0 Ankle used: Right Treatment Setting: 8 Feeling/ Response: Toe flex Comments:   Preformed By: Zara Council, PA  Assistant: Toniann Fail, LPN

## 2017-02-12 ENCOUNTER — Telehealth: Payer: Self-pay | Admitting: Family Medicine

## 2017-02-12 DIAGNOSIS — F419 Anxiety disorder, unspecified: Secondary | ICD-10-CM

## 2017-02-12 DIAGNOSIS — M25561 Pain in right knee: Principal | ICD-10-CM

## 2017-02-12 DIAGNOSIS — E785 Hyperlipidemia, unspecified: Secondary | ICD-10-CM

## 2017-02-12 DIAGNOSIS — G8929 Other chronic pain: Secondary | ICD-10-CM

## 2017-02-12 DIAGNOSIS — M25562 Pain in left knee: Principal | ICD-10-CM

## 2017-02-12 NOTE — Progress Notes (Deleted)
Chief Complaint:  No chief complaint on file.    HPI: 83 yo WF who presents today to begin PTNS therapy.  This will be # 11/12 weekly treatments.  Background history    Patient stated that she has had urinary incontinence for several years.  She had been tried on anticholinergics and Myrbetriq, but she could not tolerate the side effects.  She also completed a 12 weekly series of PTNS and some maintenance PTNS.   She feels that the PTNS may have been helpful.  Patient has incontinence with urgency.   She is experiencing incontinent episodes during the day.  Her incontinence volume is large, soaking through her clothes.   She is wearing 2 to 3 thick pads/depends daily.  She has recently experienced night time incontinence.  The patient has been experiencing urgency x 4-7, frequency x 4-7, not restricting fluids to avoid visits to the restroom, is engaging in toilet mapping, incontinence x 8 or more and nocturia x 0-3.   She does not have a history of urinary tract infections, STI's or injury to the bladder.   She denies dysuria, gross hematuria, suprapubic pain, back pain, abdominal pain or flank pain.  She has not had any recent fevers, chills, nausea or vomiting.  She does not have a history of nephrolithiasis, GU surgery or GU trauma.   She is not sexually active.  She is post menopausal.  She admits to constipation.  She is not having pain with bladder filling.    She has not had any recent imaging studies.   She is drinking only water daily.   Her risk factors for incontinence are age, depression, vaginal atrophy and pelvic surgery.  She is taking antihistamines, decongestants, benzo's, opioids, ACE inhibitors and diuretics.   Serum creatinine 0.76 in 05/2016   Previous Therapy:     She has failed Myrbetriq and anticholinergics   Contraindications present for PTNS      Pacemaker - NO       Implantable defibrillator - NO      History of abnormal bleeding - NO      History of neuropathies  or nerve damage - NO  Discussed with patient possible complications of procedure, such as discomfort, bleeding at insertion/stimulation site, procedure consent signed  Patient goals:     To decrease trips to the bathroom.    Reemphasized that most patient's will see benefit by the 8th week of treatment, but about 10 to 20% of patient may be late responders.  If they happen to be late responders, it is important to continue with the therapy beyond the 12 weekly treatments as many of those patient find benefit.  Symptoms prior to starting PTNS therapy - The patient has been experiencing urgency x 4-7, frequency x 4-7, not restricting fluids to avoid visits to the restroom, is engaging in toilet mapping, incontinence x 8 or more and nocturia x 0-3.    Today, she is complaining of early morning urgency.   After her tenth treatment, she is experiencing 5 day time voids (stable), 2-3 night time voids (improved), no (improved) urgency and no incontinence (stable).  She denies fevers, chills, nausea and vomiting.  She denies dysuria, gross hematuria and suprapubic pain.     PMH: Past Medical History:  Diagnosis Date  . Arthritis   . Bladder incontinence   . Breast cancer ([place]) 1998  . Chronic systolic CHF (congestive heart failure) ([place]) 01/10/2016   a. 09/2015 Echo: EF 40-45%, possible inf  HK, mod PAH (PASP 31mmHg).  . Depression   . GERD (gastroesophageal reflux disease)   . History of kidney stones   . HLD (hyperlipidemia)   . Hypertension   . Neuropathy of both feet   . NICM (nonischemic cardiomyopathy) ([place])    a. 08/2015 Lexiscan MV: EF 39%, no ischemia; b. 09/2015 Echo: EF 40-45%.  . Right Humerus head fracture    a. 07/2016 - managed conservatively.  . 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 ABDOMINAL HYSTERECTOMY    . BREAST LUMPECTOMY    . CATARACT EXTRACTION    . CHOLECYSTECTOMY    . JOINT REPLACEMENT Right     Hip  . KNEE SURGERY Right     Home Medications:  Allergies as of 02/13/2017      Reactions   2,4-d Dimethylamine (amisol) Other (See Comments)   Other Reaction: OTHER REACTION-IRRITATION TO Esophagous   Celebrex [celecoxib] Other (See Comments)    esophagitis Esophageal spasms     Ciprofloxacin Other (See Comments)   Esophageal irritation   Ibuprofen    Other reaction(s): Other (See Comments) reflux   Metronidazole Other (See Comments)   neuropathy   Naproxen Other (See Comments)   GI UPSET   Other Other (See Comments)   Other Reaction: esophagitis      Medication List       Accurate as of 02/12/17  2:22 PM. Always use your most recent med list.          amLODipine 5 MG tablet Commonly known as:  NORVASC 5 mg.   Cholecalciferol 2000 units Tabs Take 1 tablet by mouth daily.   fluticasone 50 MCG/ACT nasal spray Commonly known as:  FLONASE Place 2 sprays into both nostrils at bedtime.   loratadine 10 MG tablet Commonly known as:  CLARITIN Take 1 tablet (10 mg total) by mouth at bedtime.   LORazepam 0.5 MG tablet Commonly known as:  ATIVAN Take 1 tablet (0.5 mg total) by mouth 2 (two) times daily as needed for anxiety.   losartan 100 MG tablet Commonly known as:  COZAAR TAKE 1 TABLET(100 MG) BY MOUTH DAILY   lovastatin 40 MG tablet Commonly known as:  MEVACOR Take 1 tablet (40 mg total) by mouth daily.   metoprolol tartrate 50 MG tablet Commonly known as:  LOPRESSOR TAKE 1 TABLET(50 MG) BY MOUTH TWICE DAILY   Polyethylene Glycol 1450 Liqd POLYETHYLENE GLYCOL (Powder) - Historical Medication  17 grams daily Active   PRESERVISION/LUTEIN Caps Take 1 each by mouth 2 (two) times daily.   traMADol 50 MG tablet Commonly known as:  ULTRAM Take 1 tablet (50 mg total) by mouth 2 (two) times daily.       Allergies:  Allergies  Allergen Reactions  . 2,4-D Dimethylamine (Amisol) Other (See Comments)    Other Reaction: OTHER REACTION-IRRITATION TO  Esophagous  . Celebrex [Celecoxib] Other (See Comments)     esophagitis Esophageal spasms    . Ciprofloxacin Other (See Comments)    Esophageal irritation  . Ibuprofen     Other reaction(s): Other (See Comments) reflux  . Metronidazole Other (See Comments)    neuropathy  . Naproxen Other (See Comments)    GI UPSET  . Other Other (See Comments)    Other Reaction: esophagitis    Family History: Family History  Problem Relation Age of Onset  . Stroke Mother   . Diabetes Father   .  Heart disease Father   . Kidney disease Neg Hx   . Bladder Cancer Neg Hx   . Kidney cancer Neg Hx     Social History:  reports that she has quit smoking. She has never used smokeless tobacco. She reports that she does not drink alcohol or use drugs.  ROS:                                         Physical Exam: There were no vitals taken for this visit.  Constitutional: Well nourished. Alert and oriented, No acute distress. HEENT: [place] AT, moist mucus membranes. Trachea midline, no masses. Cardiovascular: No clubbing, cyanosis, or edema. Respiratory: Normal respiratory effort, no increased work of breathing. Skin: No rashes, bruises or suspicious lesions. Lymph: No cervical or inguinal adenopathy. Neurologic: Grossly intact, no focal deficits, moving all 4 extremities. Psychiatric: Normal mood and affect.  Laboratory Data: Lab Results  Component Value Date   CREATININE 0.76 05/17/2016    Lab Results  Component Value Date   HGBA1C 5.4 02/14/2016       Component Value Date/Time   CHOL 147 10/11/2016 0804   CHOL 131 06/04/2015 0810   HDL 48 (L) 10/11/2016 0804   HDL 54 06/04/2015 0810   CHOLHDL 3.1 10/11/2016 0804   VLDL 29 10/11/2016 0804   LDLCALC 70 10/11/2016 0804   LDLCALC 53 06/04/2015 0810    Lab Results  Component Value Date   AST 15 05/17/2016   Lab Results  Component Value Date   ALT 10 05/17/2016   I have reviewed the labs.    PTNS  treatment: The needle electrode was inserted into the lower, inner aspect of the patient's right leg. The surface electrode was placed on the inside arch of the foot on the treatment leg. The lead set was connected to the stimulator and the needle electrode clip was connected to the needle electrode. The stimulator that produces an adjustable electrical pulse that travels to the sacral nerve plexus via the tibial nerve was increased to 8 until the patient received a toe flex.   Assessment & Plan:   1. Urge incontinence  Treatment Plan:  The needle electrode was removed without difficulty to the patient.  Patient tolerated the procedure for 30 minutes.  She will return next week for # 12 out of 12 of their weekly PTNS treatment's  2. Nocturia  - I suggested that the patient keep a bladder diary writing down what she eats and drinks an times and her voiding pattern - she did keep a diary - but she did not record fluid volumes  - discussed avoiding fluids after 6, avoiding caffeine, a low-salt diet and the possibility of sleep apnea  No Follow-up on file.  These notes generated with voice recognition software. I apologize for typographical errors.  Zara Council, [facility] [address], [place], [place] [address] [phone]

## 2017-02-12 NOTE — Telephone Encounter (Signed)
Patient called stating that she is needing a refill on the following medications:  Lorazepam 0.5mg  Tramadol 50 mg  Lovastatin (send this one to [facility] on [address])  Please call patient once prescriptions are ready.  She prefers for a message not to be left because she is unable to check voice messages.  Please call her cell phone [phone]

## 2017-02-13 ENCOUNTER — Ambulatory Visit: Payer: Medicare Other | Admitting: Urology

## 2017-02-13 ENCOUNTER — Encounter: Payer: Self-pay | Admitting: Urology

## 2017-02-13 NOTE — Telephone Encounter (Signed)
Pt last seen 12/04/16, please advise

## 2017-02-14 ENCOUNTER — Ambulatory Visit: Payer: Medicare Other | Admitting: Urology

## 2017-02-14 NOTE — Telephone Encounter (Signed)
After review of patient's chart, it is okay to refill all 3 medications. Please print prescriptions for me to sign

## 2017-02-15 ENCOUNTER — Telehealth: Payer: Self-pay

## 2017-02-15 MED ORDER — LOVASTATIN 40 MG PO TABS: 40.0000 mg | ORAL_TABLET | Freq: Every day | ORAL | 0 refills | Status: DC

## 2017-02-15 MED ORDER — TRAMADOL HCL 50 MG PO TABS: 50.0000 mg | ORAL_TABLET | Freq: Two times a day (BID) | ORAL | 0 refills | Status: DC

## 2017-02-15 MED ORDER — LORAZEPAM 0.5 MG PO TABS: 0.5000 mg | ORAL_TABLET | Freq: Two times a day (BID) | ORAL | 2 refills | Status: DC | PRN

## 2017-02-15 NOTE — Telephone Encounter (Signed)
All prescriptions printed and given to MD for signature.

## 2017-02-15 NOTE — Telephone Encounter (Signed)
Called pt no answer. LM informing pt that rx was ready for pick up.

## 2017-02-20 NOTE — Progress Notes (Signed)
Chief Complaint:  Chief Complaint  Patient presents with  . PTNS    Urge Incontinence     HPI: 83 yo WF who presents today to begin PTNS therapy.  This will be # 11/12 weekly treatments.  Background history    Patient stated that she has had urinary incontinence for several years.  She had been tried on anticholinergics and Myrbetriq, but she could not tolerate the side effects.  She also completed a 12 weekly series of PTNS and some maintenance PTNS.   She feels that the PTNS may have been helpful.  Patient has incontinence with urgency.   She is experiencing incontinent episodes during the day.  Her incontinence volume is large, soaking through her clothes.   She is wearing 2 to 3 thick pads/depends daily.  She has recently experienced night time incontinence.  The patient has been experiencing urgency x 4-7, frequency x 4-7, not restricting fluids to avoid visits to the restroom, is engaging in toilet mapping, incontinence x 8 or more and nocturia x 0-3.   She does not have a history of urinary tract infections, STI's or injury to the bladder.   She denies dysuria, gross hematuria, suprapubic pain, back pain, abdominal pain or flank pain.  She has not had any recent fevers, chills, nausea or vomiting.  She does not have a history of nephrolithiasis, GU surgery or GU trauma.   She is not sexually active.  She is post menopausal.  She admits to constipation.  She is not having pain with bladder filling.    She has not had any recent imaging studies.   She is drinking only water daily.   Her risk factors for incontinence are age, depression, vaginal atrophy and pelvic surgery.  She is taking antihistamines, decongestants, benzo's, opioids, ACE inhibitors and diuretics.   Serum creatinine 0.76 in 05/2016   Previous Therapy:     She has failed Myrbetriq and anticholinergics   Contraindications present for PTNS      Pacemaker - NO       Implantable defibrillator - NO      History of abnormal  bleeding - NO      History of neuropathies or nerve damage - NO  Discussed with patient possible complications of procedure, such as discomfort, bleeding at insertion/stimulation site, procedure consent signed  Patient goals:     To decrease trips to the bathroom.    Reemphasized that most patient's will see benefit by the 8th week of treatment, but about 10 to 20% of patient may be late responders.  If they happen to be late responders, it is important to continue with the therapy beyond the 12 weekly treatments as many of those patient find benefit.  Symptoms prior to starting PTNS therapy - The patient has been experiencing urgency x 4-7, frequency x 4-7, not restricting fluids to avoid visits to the restroom, is engaging in toilet mapping, incontinence x 8 or more and nocturia x 0-3.    Today, she is complaining of early morning urgency.   After her tenth treatment, she is experiencing 4-5 day time voids (stable), 2-3 night time voids (stable), no (stable) urgency and no incontinence (stable).  She denies fevers, chills, nausea and vomiting.  She denies dysuria, gross hematuria and suprapubic pain.     PMH: Past Medical History:  Diagnosis Date  . Arthritis   . Bladder incontinence   . Breast cancer ([facility] [facility]) 1998  . Chronic systolic CHF (congestive heart failure) ([place])  01/10/2016   a. 09/2015 Echo: EF 40-45%, possible inf HK, mod PAH (PASP 52mmHg).  . Depression   . GERD (gastroesophageal reflux disease)   . History of kidney stones   . HLD (hyperlipidemia)   . Hypertension   . Neuropathy of both feet   . NICM (nonischemic cardiomyopathy) ([place])    a. 08/2015 Lexiscan MV: EF 39%, no ischemia; b. 09/2015 Echo: EF 40-45%.  . Right Humerus head fracture    a. 07/2016 - managed conservatively.  . 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 ABDOMINAL HYSTERECTOMY    . BREAST LUMPECTOMY    . CATARACT EXTRACTION    .  CHOLECYSTECTOMY    . JOINT REPLACEMENT Right    Hip  . KNEE SURGERY Right     Home Medications:  Allergies as of 02/21/2017      Reactions   2,4-d Dimethylamine (amisol) Other (See Comments)   Other Reaction: OTHER REACTION-IRRITATION TO Esophagous   Celebrex [celecoxib] Other (See Comments)    esophagitis Esophageal spasms     Ciprofloxacin Other (See Comments)   Esophageal irritation   Ibuprofen    Other reaction(s): Other (See Comments) reflux   Metronidazole Other (See Comments)   neuropathy   Naproxen Other (See Comments)   GI UPSET   Other Other (See Comments)   Other Reaction: esophagitis      Medication List       Accurate as of 02/21/17 11:59 PM. Always use your most recent med list.          amLODipine 5 MG tablet Commonly known as:  NORVASC 5 mg.   Cholecalciferol 2000 units Tabs Take 1 tablet by mouth daily.   fluticasone 50 MCG/ACT nasal spray Commonly known as:  FLONASE Place 2 sprays into both nostrils at bedtime.   loratadine 10 MG tablet Commonly known as:  CLARITIN Take 1 tablet (10 mg total) by mouth at bedtime.   LORazepam 0.5 MG tablet Commonly known as:  ATIVAN Take 1 tablet (0.5 mg total) by mouth 2 (two) times daily as needed for anxiety.   losartan 100 MG tablet Commonly known as:  COZAAR TAKE 1 TABLET(100 MG) BY MOUTH DAILY   lovastatin 40 MG tablet Commonly known as:  MEVACOR Take 1 tablet (40 mg total) by mouth daily.   metoprolol tartrate 50 MG tablet Commonly known as:  LOPRESSOR TAKE 1 TABLET(50 MG) BY MOUTH TWICE DAILY   Polyethylene Glycol 1450 Liqd POLYETHYLENE GLYCOL (Powder) - Historical Medication  17 grams daily Active   PRESERVISION/LUTEIN Caps Take 1 each by mouth 2 (two) times daily.   traMADol 50 MG tablet Commonly known as:  ULTRAM Take 1 tablet (50 mg total) by mouth 2 (two) times daily.       Allergies:  Allergies  Allergen Reactions  . 2,4-D Dimethylamine (Amisol) Other (See Comments)     Other Reaction: OTHER REACTION-IRRITATION TO Esophagous  . Celebrex [Celecoxib] Other (See Comments)     esophagitis Esophageal spasms    . Ciprofloxacin Other (See Comments)    Esophageal irritation  . Ibuprofen     Other reaction(s): Other (See Comments) reflux  . Metronidazole Other (See Comments)    neuropathy  . Naproxen Other (See Comments)    GI UPSET  . Other Other (See Comments)    Other Reaction: esophagitis    Family History: Family History  Problem Relation Age of Onset  . Stroke  Mother   . Diabetes Father   . Heart disease Father   . Kidney disease Neg Hx   . Bladder Cancer Neg Hx   . Kidney cancer Neg Hx     Social History:  reports that she has quit smoking. She has never used smokeless tobacco. She reports that she does not drink alcohol or use drugs.  ROS: UROLOGY Frequent Urination?: No Hard to postpone urination?: No Burning/pain with urination?: No Get up at night to urinate?: Yes Leakage of urine?: Yes Urine stream starts and stops?: No Trouble starting stream?: No Do you have to strain to urinate?: No Blood in urine?: No Urinary tract infection?: No Sexually transmitted disease?: No Injury to kidneys or bladder?: No Painful intercourse?: No Weak stream?: No Currently pregnant?: No Vaginal bleeding?: No Last menstrual period?: n  Gastrointestinal Nausea?: No Vomiting?: No Indigestion/heartburn?: No Diarrhea?: No Constipation?: No  Constitutional Fever: No Night sweats?: No Weight loss?: No Fatigue?: No  Skin Skin rash/lesions?: No Itching?: No  Eyes Blurred vision?: No Double vision?: No  Ears/Nose/Throat Sore throat?: No Sinus problems?: Yes  Hematologic/Lymphatic Swollen glands?: No Easy bruising?: Yes  Cardiovascular Leg swelling?: No Chest pain?: No  Respiratory Cough?: No Shortness of breath?: No  Endocrine Excessive thirst?: No  Musculoskeletal Back pain?: Yes Joint pain?:  No  Neurological Headaches?: No Dizziness?: No  Psychologic Depression?: No Anxiety?: No   Physical Exam: BP (!) 166/68   Pulse 72   Ht 5\' 6"  (1.676 m)   Wt 169 lb 14.4 oz (77.1 kg)   BMI 27.42 kg/m   Constitutional: Well nourished. Alert and oriented, No acute distress. HEENT: [place] AT, moist mucus membranes. Trachea midline, no masses. Cardiovascular: No clubbing, cyanosis, or edema. Respiratory: Normal respiratory effort, no increased work of breathing. Skin: No rashes, bruises or suspicious lesions. Lymph: No cervical or inguinal adenopathy. Neurologic: Grossly intact, no focal deficits, moving all 4 extremities. Psychiatric: Normal mood and affect.  Laboratory Data: Lab Results  Component Value Date   CREATININE 0.76 05/17/2016       Component Value Date/Time   CHOL 147 10/11/2016 0804   CHOL 131 06/04/2015 0810   HDL 48 (L) 10/11/2016 0804   HDL 54 06/04/2015 0810   CHOLHDL 3.1 10/11/2016 0804   VLDL 29 10/11/2016 0804   LDLCALC 70 10/11/2016 0804   LDLCALC 53 06/04/2015 0810    Lab Results  Component Value Date   AST 15 05/17/2016   Lab Results  Component Value Date   ALT 10 05/17/2016   I have reviewed the labs.    PTNS treatment: The needle electrode was inserted into the lower, inner aspect of the patient's left leg. The surface electrode was placed on the inside arch of the foot on the treatment leg. The lead set was connected to the stimulator and the needle electrode clip was connected to the needle electrode. The stimulator that produces an adjustable electrical pulse that travels to the sacral nerve plexus via the tibial nerve was increased to 1 until the patient received a toe flex and sensory response.     Assessment & Plan:   1. Urge incontinence  Treatment Plan:  The needle electrode was removed without difficulty to the patient.  Patient tolerated the procedure for 30 minutes.  She will return next week for her # 12 PTNS  treatment's  2. Nocturia  - I suggested that the patient keep a bladder diary writing down what she eats and drinks an times and her  voiding pattern - she did keep a diary - but she did not record fluid volumes  - discussed avoiding fluids after 6, avoiding caffeine, a low-salt diet and the possibility of sleep apnea  Return in about 1 week (around 02/28/2017) for # 12 PTNS.  These notes generated with voice recognition software. I apologize for typographical errors.  Zara Council, [facility] [address] [place], [place] [address] [phone]

## 2017-02-21 ENCOUNTER — Ambulatory Visit (INDEPENDENT_AMBULATORY_CARE_PROVIDER_SITE_OTHER): Payer: Medicare Other | Admitting: Urology

## 2017-02-21 ENCOUNTER — Encounter: Payer: Self-pay | Admitting: Urology

## 2017-02-21 VITALS — BP 166/68 | HR 72 | Ht 66.0 in | Wt 169.9 lb

## 2017-02-21 DIAGNOSIS — N3941 Urge incontinence: Secondary | ICD-10-CM

## 2017-02-21 DIAGNOSIS — R351 Nocturia: Secondary | ICD-10-CM | POA: Diagnosis not present

## 2017-02-21 NOTE — Progress Notes (Signed)
PTNS  Session # 11  Health & Social Factors: No Change Caffeine: 0 Alcohol: 0 Daytime voids #per day: 4-5 Night-time voids #per night: 2 Urgency: None Incontinence Episodes #per day: 0 Ankle used: Left Treatment Setting: 1 Feeling/ Response: Both  Preformed By: Zara Council PA-C  Assistant: Lyndee Hensen CMA  Follow Up: One week

## 2017-02-26 NOTE — Progress Notes (Signed)
Chief Complaint:  Chief Complaint  Patient presents with  . PTNS    Urge Incontinence     HPI: 83 yo WF who presents today to begin PTNS therapy.  This will be # 12/12 weekly treatments.  Background history    Patient stated that she has had urinary incontinence for several years.  She had been tried on anticholinergics and Myrbetriq, but she could not tolerate the side effects.  She also completed a 12 weekly series of PTNS and some maintenance PTNS.   She feels that the PTNS may have been helpful.  Patient has incontinence with urgency.   She is experiencing incontinent episodes during the day.  Her incontinence volume is large, soaking through her clothes.   She is wearing 2 to 3 thick pads/depends daily.  She has recently experienced night time incontinence.  The patient has been experiencing urgency x 4-7, frequency x 4-7, not restricting fluids to avoid visits to the restroom, is engaging in toilet mapping, incontinence x 8 or more and nocturia x 0-3.   She does not have a history of urinary tract infections, STI's or injury to the bladder.   She denies dysuria, gross hematuria, suprapubic pain, back pain, abdominal pain or flank pain.  She has not had any recent fevers, chills, nausea or vomiting.  She does not have a history of nephrolithiasis, GU surgery or GU trauma.   She is not sexually active.  She is post menopausal.  She admits to constipation.  She is not having pain with bladder filling.    She has not had any recent imaging studies.   She is drinking only water daily.   Her risk factors for incontinence are age, depression, vaginal atrophy and pelvic surgery.  She is taking antihistamines, decongestants, benzo's, opioids, ACE inhibitors and diuretics.   Serum creatinine 0.76 in 05/2016   Previous Therapy:     She has failed Myrbetriq and anticholinergics   Contraindications present for PTNS      Pacemaker - NO       Implantable defibrillator - NO      History of abnormal  bleeding - NO      History of neuropathies or nerve damage - NO  Discussed with patient possible complications of procedure, such as discomfort, bleeding at insertion/stimulation site, procedure consent signed  Patient goals:     To decrease trips to the bathroom.    Reemphasized that most patient's will see benefit by the 8th week of treatment, but about 10 to 20% of patient may be late responders.  If they happen to be late responders, it is important to continue with the therapy beyond the 12 weekly treatments as many of those patient find benefit.  Symptoms prior to starting PTNS therapy - The patient has been experiencing urgency x 4-7, frequency x 4-7, not restricting fluids to avoid visits to the restroom, is engaging in toilet mapping, incontinence x 8 or more and nocturia x 0-3.    Today, she is complaining of early morning urgency.   After her eleventh treatment, she is experiencing 4-5 day time voids (stable), 2 night time voids (stable), no (stable) urgency and no incontinence (stable).  She denies fevers, chills, nausea and vomiting.  She denies dysuria, gross hematuria and suprapubic pain.    She voiced her opinion today that she is very frustrated that she hasn't found the PTNS therapy helpful.  She states that her symptoms occur mostly during the night.  She has  not been diagnosed with sleep apnea.  She has not undergone studies for sleep apnea.  She has significant life stressors at this time. She is taking care of her husband and he wakes her during the night for different reasons.     PMH: Past Medical History:  Diagnosis Date  . Arthritis   . Bladder incontinence   . Breast cancer ([place]) 1998  . Chronic systolic CHF (congestive heart failure) ([place]) 01/10/2016   a. 09/2015 Echo: EF 40-45%, possible inf HK, mod PAH (PASP 44mmHg).  . Depression   . GERD (gastroesophageal reflux disease)   . History of kidney stones   . HLD (hyperlipidemia)   . Hypertension   . Neuropathy  of both feet   . NICM (nonischemic cardiomyopathy) ([place])    a. 08/2015 Lexiscan MV: EF 39%, no ischemia; b. 09/2015 Echo: EF 40-45%.  . Right Humerus head fracture    a. 07/2016 - managed conservatively.  . 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 ABDOMINAL HYSTERECTOMY    . BREAST LUMPECTOMY    . CATARACT EXTRACTION    . CHOLECYSTECTOMY    . JOINT REPLACEMENT Right    Hip  . KNEE SURGERY Right     Home Medications:  Allergies as of 02/28/2017      Reactions   2,4-d Dimethylamine (amisol) Other (See Comments)   Other Reaction: OTHER REACTION-IRRITATION TO Esophagous   Celebrex [celecoxib] Other (See Comments)    esophagitis Esophageal spasms     Ciprofloxacin Other (See Comments)   Esophageal irritation   Ibuprofen    Other reaction(s): Other (See Comments) reflux   Metronidazole Other (See Comments)   neuropathy   Naproxen Other (See Comments)   GI UPSET   Other Other (See Comments)   Other Reaction: esophagitis      Medication List       Accurate as of 02/28/17  9:47 AM. Always use your most recent med list.          ACIDOPHILUS PROBIOTIC COMPLEX PO Take by mouth.   amLODipine 5 MG tablet Commonly known as:  NORVASC 5 mg.   Cholecalciferol 2000 units Tabs Take 1 tablet by mouth daily.   fluticasone 50 MCG/ACT nasal spray Commonly known as:  FLONASE Place 2 sprays into both nostrils at bedtime.   furosemide 20 MG tablet Commonly known as:  LASIX Take by mouth.   lansoprazole 30 MG capsule Commonly known as:  PREVACID Take by mouth.   loratadine 10 MG tablet Commonly known as:  CLARITIN Take 1 tablet (10 mg total) by mouth at bedtime.   LORazepam 0.5 MG tablet Commonly known as:  ATIVAN Take 1 tablet (0.5 mg total) by mouth 2 (two) times daily as needed for anxiety.   losartan 100 MG tablet Commonly known as:  COZAAR TAKE 1 TABLET(100 MG) BY MOUTH DAILY   lovastatin 40 MG  tablet Commonly known as:  MEVACOR Take 1 tablet (40 mg total) by mouth daily.   metoprolol tartrate 50 MG tablet Commonly known as:  LOPRESSOR TAKE 1 TABLET(50 MG) BY MOUTH TWICE DAILY   polyethylene glycol powder powder Commonly known as:  GLYCOLAX/MIRALAX 17 grams a day   predniSONE 10 MG tablet Commonly known as:  DELTASONE Take by mouth.   PRESERVISION/LUTEIN Caps Take 1 each by mouth 2 (two) times daily.   PRESERVISION/LUTEIN PO Take by mouth.   traMADol 50 MG tablet Commonly known  as:  ULTRAM Take 1 tablet (50 mg total) by mouth 2 (two) times daily.   vancomycin 1000 MG injection Commonly known as:  VANCOCIN vancomycin 1,000 mg intravenous injection       Allergies:  Allergies  Allergen Reactions  . 2,4-D Dimethylamine (Amisol) Other (See Comments)    Other Reaction: OTHER REACTION-IRRITATION TO Esophagous  . Celebrex [Celecoxib] Other (See Comments)     esophagitis Esophageal spasms    . Ciprofloxacin Other (See Comments)    Esophageal irritation  . Ibuprofen     Other reaction(s): Other (See Comments) reflux  . Metronidazole Other (See Comments)    neuropathy  . Naproxen Other (See Comments)    GI UPSET  . Other Other (See Comments)    Other Reaction: esophagitis    Family History: Family History  Problem Relation Age of Onset  . Stroke Mother   . Diabetes Father   . Heart disease Father   . Kidney disease Neg Hx   . Bladder Cancer Neg Hx   . Kidney cancer Neg Hx     Social History:  reports that she has quit smoking. She has never used smokeless tobacco. She reports that she does not drink alcohol or use drugs.  ROS: UROLOGY Frequent Urination?: No Hard to postpone urination?: No Burning/pain with urination?: No Get up at night to urinate?: Yes Leakage of urine?: Yes Urine stream starts and stops?: No Trouble starting stream?: No Do you have to strain to urinate?: No Blood in urine?: No Urinary tract infection?: No Sexually  transmitted disease?: No Injury to kidneys or bladder?: No Painful intercourse?: No Weak stream?: No Currently pregnant?: No Vaginal bleeding?: No Last menstrual period?: n  Gastrointestinal Nausea?: No Vomiting?: No Indigestion/heartburn?: No Diarrhea?: No Constipation?: No  Constitutional Fever: No Night sweats?: No Weight loss?: No Fatigue?: No  Skin Skin rash/lesions?: No Itching?: No  Eyes Blurred vision?: No Double vision?: No  Ears/Nose/Throat Sore throat?: No Sinus problems?: Yes  Hematologic/Lymphatic Swollen glands?: No Easy bruising?: No  Cardiovascular Leg swelling?: No Chest pain?: No  Respiratory Cough?: No Shortness of breath?: No  Endocrine Excessive thirst?: No  Musculoskeletal Back pain?: No Joint pain?: No  Neurological Headaches?: No Dizziness?: No  Psychologic Depression?: No Anxiety?: No   Physical Exam: BP (!) 149/73   Pulse 67   Ht 5\' 6"  (1.676 m)   Wt 168 lb (76.2 kg)   BMI 27.12 kg/m   Constitutional: Well nourished. Alert and oriented, No acute distress. HEENT: [place] AT, moist mucus membranes. Trachea midline, no masses. Cardiovascular: No clubbing, cyanosis, or edema. Respiratory: Normal respiratory effort, no increased work of breathing. Skin: No rashes, bruises or suspicious lesions. Lymph: No cervical or inguinal adenopathy. Neurologic: Grossly intact, no focal deficits, moving all 4 extremities. Psychiatric: Normal mood and affect.  Laboratory Data: Lab Results  Component Value Date   CREATININE 0.76 05/17/2016       Component Value Date/Time   CHOL 147 10/11/2016 0804   CHOL 131 06/04/2015 0810   HDL 48 (L) 10/11/2016 0804   HDL 54 06/04/2015 0810   CHOLHDL 3.1 10/11/2016 0804   VLDL 29 10/11/2016 0804   LDLCALC 70 10/11/2016 0804   LDLCALC 53 06/04/2015 0810    Lab Results  Component Value Date   AST 15 05/17/2016   Lab Results  Component Value Date   ALT 10 05/17/2016   I have  reviewed the labs.    PTNS treatment: The needle electrode was inserted into the  lower, inner aspect of the patient's left leg. The surface electrode was placed on the inside arch of the foot on the treatment leg. The lead set was connected to the stimulator and the needle electrode clip was connected to the needle electrode. The stimulator that produces an adjustable electrical pulse that travels to the sacral nerve plexus via the tibial nerve was increased to 3 until the patient received a toe flex and sensory response.     Assessment & Plan:   1. Urge incontinence  - Patient has failed anticholinergics and Myrbetriq  - She has not found the PTNS effective   - she has significant life stressors revolving around her husband but she is unwilling to take steps to improve her situation, such as getting more at home care so she may have relief and/or moving to a facility that offers more one-to-one care for her husband - she states that other doctors have also approached the subject with her and she is not willing to make changes at this time  Treatment Plan:  The needle electrode was removed without difficulty to the patient.  Patient tolerated the procedure for 30 minutes.  She will return next month for her maintenance PTNS treatment's  2. Nocturia  - I suggested that the patient keep a bladder diary writing down what she eats and drinks an times and her voiding pattern - she did keep a diary - but she did not record fluid volumes  - discussed avoiding fluids after 6, avoiding caffeine, a low-salt diet and the possibility of sleep apnea - she is very resistant to the idea of a sleep study at this time  Return in about 1 month (around 03/31/2017) for Maintenance PTNS.  These notes generated with voice recognition software. I apologize for typographical errors.  Zara Council, [facility] [address], [place], [place] [address] [phone]

## 2017-02-28 ENCOUNTER — Ambulatory Visit (INDEPENDENT_AMBULATORY_CARE_PROVIDER_SITE_OTHER): Payer: Medicare Other | Admitting: Urology

## 2017-02-28 ENCOUNTER — Encounter: Payer: Self-pay | Admitting: Urology

## 2017-02-28 VITALS — BP 149/73 | HR 67 | Ht 66.0 in | Wt 168.0 lb

## 2017-02-28 DIAGNOSIS — R351 Nocturia: Secondary | ICD-10-CM

## 2017-02-28 DIAGNOSIS — N3941 Urge incontinence: Secondary | ICD-10-CM | POA: Diagnosis not present

## 2017-02-28 NOTE — Progress Notes (Signed)
PTNS  Session # 12  Health & Social Factors: No Change Caffeine: 0 Alcohol: 0 Daytime voids #per day: 4-5 Night-time voids #per night: 2 more or less Urgency: None Incontinence Episodes #per day: 0 Ankle used: Left Treatment Setting: 3 Feeling/ Response: Both Comments: Patient given options for care and denied options  Preformed By: Zara Council PA-C   Follow Up: Patient to call back when she decides what she wants to do next

## 2017-03-05 ENCOUNTER — Ambulatory Visit: Payer: Medicare Other | Admitting: Family Medicine

## 2017-03-21 ENCOUNTER — Other Ambulatory Visit: Payer: Self-pay | Admitting: Family Medicine

## 2017-03-21 DIAGNOSIS — I10 Essential (primary) hypertension: Secondary | ICD-10-CM

## 2017-03-21 NOTE — Telephone Encounter (Signed)
Please call patient to schedule her 3 month follow up with Dr. Manuella Ghazi. I have provided a 30 day supply to allow her to schedule. Thanks!

## 2017-03-21 NOTE — Telephone Encounter (Signed)
lvm to inform that prescription has been sent to pharmacy and to schedule appt with dr Manuella Ghazi

## 2017-03-28 ENCOUNTER — Other Ambulatory Visit: Payer: Self-pay | Admitting: Family Medicine

## 2017-03-28 DIAGNOSIS — I10 Essential (primary) hypertension: Secondary | ICD-10-CM

## 2017-03-28 MED ORDER — LOSARTAN POTASSIUM 100 MG PO TABS: 100.0000 mg | ORAL_TABLET | Freq: Every day | ORAL | 2 refills | Status: DC

## 2017-03-28 NOTE — Telephone Encounter (Signed)
Copied from [place] [id number]. Topic: Inquiry >> Mar 28, 2017  8:37 AM Malena Catholic I, NT wrote: Reason for CRM: pt call for Rx refill for Cozaar 100 Mg  She use [facility] in [place]

## 2017-03-28 NOTE — Addendum Note (Signed)
Addended by: Bud Face N on: 03/28/2017 09:14 AM   Modules accepted: Orders

## 2017-04-09 ENCOUNTER — Other Ambulatory Visit: Payer: Self-pay | Admitting: Family Medicine

## 2017-04-09 DIAGNOSIS — I10 Essential (primary) hypertension: Secondary | ICD-10-CM

## 2017-04-09 MED ORDER — METOPROLOL TARTRATE 50 MG PO TABS: ORAL_TABLET | ORAL | 1 refills | Status: DC

## 2017-04-09 NOTE — Telephone Encounter (Signed)
Copied from [place]. Topic: General - Other >> Apr 09, 2017  3:15 PM Neva Seat wrote: Has about a week left of medication.   Needs a refill of Metoprolol  [facility] on [address] in [place]

## 2017-04-11 ENCOUNTER — Telehealth: Payer: Self-pay

## 2017-04-12 NOTE — Telephone Encounter (Signed)
erroneous

## 2017-04-16 ENCOUNTER — Other Ambulatory Visit: Payer: Self-pay | Admitting: Orthopedic Surgery

## 2017-04-16 DIAGNOSIS — M5441 Lumbago with sciatica, right side: Principal | ICD-10-CM

## 2017-04-16 DIAGNOSIS — M5442 Lumbago with sciatica, left side: Principal | ICD-10-CM

## 2017-04-16 DIAGNOSIS — M4316 Spondylolisthesis, lumbar region: Secondary | ICD-10-CM

## 2017-04-16 DIAGNOSIS — M5136 Other intervertebral disc degeneration, lumbar region: Secondary | ICD-10-CM

## 2017-04-16 DIAGNOSIS — G8929 Other chronic pain: Secondary | ICD-10-CM

## 2017-04-24 ENCOUNTER — Ambulatory Visit: Payer: Medicare Other

## 2017-04-30 ENCOUNTER — Ambulatory Visit
Admission: RE | Admit: 2017-04-30 | Discharge: 2017-04-30 | Disposition: A | Discharge status: Home / Self Care | Payer: Medicare Other | Source: Ambulatory Visit | Attending: Orthopedic Surgery | Admitting: Orthopedic Surgery

## 2017-04-30 DIAGNOSIS — M1288 Other specific arthropathies, not elsewhere classified, other specified site: Secondary | ICD-10-CM | POA: Diagnosis not present

## 2017-04-30 DIAGNOSIS — M5136 Other intervertebral disc degeneration, lumbar region: Secondary | ICD-10-CM | POA: Insufficient documentation

## 2017-04-30 DIAGNOSIS — M48061 Spinal stenosis, lumbar region without neurogenic claudication: Secondary | ICD-10-CM | POA: Diagnosis not present

## 2017-04-30 DIAGNOSIS — G8929 Other chronic pain: Secondary | ICD-10-CM | POA: Diagnosis not present

## 2017-04-30 DIAGNOSIS — M5441 Lumbago with sciatica, right side: Secondary | ICD-10-CM | POA: Diagnosis not present

## 2017-04-30 DIAGNOSIS — M4856XA Collapsed vertebra, not elsewhere classified, lumbar region, initial encounter for fracture: Secondary | ICD-10-CM | POA: Diagnosis not present

## 2017-04-30 DIAGNOSIS — M4316 Spondylolisthesis, lumbar region: Secondary | ICD-10-CM | POA: Diagnosis not present

## 2017-04-30 DIAGNOSIS — M5442 Lumbago with sciatica, left side: Secondary | ICD-10-CM | POA: Diagnosis not present

## 2017-04-30 DIAGNOSIS — M7138 Other bursal cyst, other site: Secondary | ICD-10-CM | POA: Insufficient documentation

## 2017-05-16 ENCOUNTER — Ambulatory Visit: Payer: Medicare Other | Admitting: Pulmonary Disease

## 2017-05-21 ENCOUNTER — Encounter: Payer: Self-pay | Admitting: Pulmonary Disease

## 2017-05-21 ENCOUNTER — Ambulatory Visit (INDEPENDENT_AMBULATORY_CARE_PROVIDER_SITE_OTHER): Payer: Medicare Other | Admitting: Pulmonary Disease

## 2017-05-21 VITALS — BP 130/70 | HR 69 | Ht 66.0 in | Wt 168.0 lb

## 2017-05-21 DIAGNOSIS — R05 Cough: Secondary | ICD-10-CM | POA: Diagnosis not present

## 2017-05-21 DIAGNOSIS — J209 Acute bronchitis, unspecified: Secondary | ICD-10-CM | POA: Diagnosis not present

## 2017-05-21 DIAGNOSIS — R058 Other specified cough: Secondary | ICD-10-CM

## 2017-05-21 DIAGNOSIS — R053 Chronic cough: Secondary | ICD-10-CM

## 2017-05-21 MED ORDER — AZITHROMYCIN 250 MG PO TABS: ORAL_TABLET | ORAL | 0 refills | Status: AC

## 2017-05-21 NOTE — Progress Notes (Signed)
PULMONARY OFFICE FOLLOW UP NOTE  Requesting MD/Service: Manuella Ghazi, MD Date of initial consultation: 09/25/16 Reason for consultation: Cough  PT PROFILE: 84 y.o. female never smoker referred for evaluation and mgmt of chronic cough. Wheezing noted on exam. Initial plan: Continue PPI and nasal steroid. Add Breo inhaler  INTERVAL: No major events  SUBJ:  Routine reevaluation for chronic cough.  Last visit, we stopped the Breo inhaler, continued Flonase, added Claritin.  With these changes, her cough resolved completely for a while.  However, her cough has relapsed in the past 6 weeks or so.  It is now productive of pale to dark or yellow sputum.  She denies hemoptysis.  She complains of posterior nasal drainage which is sometimes bloody. Denies CP, fever, purulent sputum, hemoptysis, LE edema and calf tenderness.   Vitals:   05/21/17 1131 05/21/17 1132  BP:  130/70  Pulse:  69  SpO2:  97%  Weight: 76.2 kg (168 lb)   Height: 5\' 6"  (1.676 m)      EXAM:  Gen: NAD HEENT: NCAT, mild to moderate bilateral rhinitis Neck: No LAN or JVD Lungs: No wheezes or other adventitious sounds Cardiovascular: RRR, no murmurs noted Abdomen: Soft, nontender, normal BS Ext:  no C/C/E Neuro: grossly intact  DATA:   BMP Latest Ref Rng & Units 05/17/2016 01/23/2016 09/22/2015  Glucose 65 - 99 mg/dL 104(H) 147(H) 104(H)  BUN 7 - 25 mg/dL 15 21(H) 14  Creatinine 0.60 - 0.88 mg/dL 0.76 0.82 0.76  BUN/Creat Ratio 12 - 28 - - 18  Sodium 135 - 146 mmol/L 142 141 143  Potassium 3.5 - 5.3 mmol/L 5.3 4.2 4.3  Chloride 98 - 110 mmol/L 105 108 103  CO2 20 - 31 mmol/L 30 24 25   Calcium 8.6 - 10.4 mg/dL 9.2 8.9 9.1    CBC Latest Ref Rng & Units 01/23/2016 08/31/2015 06/21/2015  WBC 3.6 - 11.0 K/uL 7.4 7.4 7.0  Hemoglobin 12.0 - 16.0 g/dL 14.0 13.9 13.5  Hematocrit 35.0 - 47.0 % 40.1 41.9 41.4  Platelets 150 - 440 K/uL 236 179 157    CXR: No new film  IMPRESSION:   Chronic cough Upper airway cough  syndrome Acute purulent bronchitis  PLAN:  Continue Flonase inhaler and Claritin as previously prescribed Z-Pak prescribed Follow-up in 3 months   Merton Border, MD PCCM service Mobile [phone] Pager [phone] 05/21/2017 11:51 AM

## 2017-05-21 NOTE — Patient Instructions (Addendum)
Continue Claritin and Flonase Z pak (azithromycin) ordered Follow up in 3 months

## 2017-05-23 ENCOUNTER — Encounter: Payer: Self-pay | Admitting: Family Medicine

## 2017-05-23 ENCOUNTER — Ambulatory Visit (INDEPENDENT_AMBULATORY_CARE_PROVIDER_SITE_OTHER): Payer: Medicare Other | Admitting: Family Medicine

## 2017-05-23 DIAGNOSIS — E785 Hyperlipidemia, unspecified: Secondary | ICD-10-CM | POA: Diagnosis not present

## 2017-05-23 DIAGNOSIS — F419 Anxiety disorder, unspecified: Secondary | ICD-10-CM

## 2017-05-23 DIAGNOSIS — I10 Essential (primary) hypertension: Secondary | ICD-10-CM | POA: Diagnosis not present

## 2017-05-23 MED ORDER — LORAZEPAM 0.5 MG PO TABS: 0.5000 mg | ORAL_TABLET | Freq: Two times a day (BID) | ORAL | 2 refills | Status: DC | PRN

## 2017-05-23 MED ORDER — LOSARTAN POTASSIUM 100 MG PO TABS: 100.0000 mg | ORAL_TABLET | Freq: Every day | ORAL | 2 refills | Status: DC

## 2017-05-23 MED ORDER — LOVASTATIN 40 MG PO TABS: 40.0000 mg | ORAL_TABLET | Freq: Every day | ORAL | 0 refills | Status: DC

## 2017-05-23 NOTE — Progress Notes (Signed)
Name: Jodi Bullock   MRN: [id number]    DOB: 1924-10-10   Date:05/23/2017       Progress Note  Subjective  Chief Complaint  Chief Complaint  Patient presents with  . Hypertension    Pt denies any issues  . Hyperlipidemia  . Medication Refill    Hypertension  This is a chronic problem. The problem is unchanged. The problem is controlled. Associated symptoms include anxiety. Pertinent negatives include no blurred vision, chest pain, headaches, palpitations or shortness of breath. Past treatments include angiotensin blockers and beta blockers. There is no history of kidney disease, CAD/MI or CVA. There is no history of chronic renal disease.  Hyperlipidemia  This is a chronic problem. The problem is controlled. Recent lipid tests were reviewed and are normal. She has no history of chronic renal disease. Pertinent negatives include no chest pain, leg pain, myalgias or shortness of breath. Current antihyperlipidemic treatment includes statins.  Anxiety  Presents for follow-up visit. Symptoms include excessive worry and nervous/anxious behavior. Patient reports no chest pain, depressed mood, insomnia, palpitations, panic, restlessness or shortness of breath. The severity of symptoms is moderate and causing significant distress. The quality of sleep is fair.       Past Medical History:  Diagnosis Date  . Arthritis   . Bladder incontinence   . Breast cancer ([place]) 1998  . Chronic systolic CHF (congestive heart failure) ([place]) 01/10/2016   a. 09/2015 Echo: EF 40-45%, possible inf HK, mod PAH (PASP 59mmHg).  . Depression   . GERD (gastroesophageal reflux disease)   . History of kidney stones   . HLD (hyperlipidemia)   . Hypertension   . Neuropathy of both feet   . NICM (nonischemic cardiomyopathy) ([place])    a. 08/2015 Lexiscan MV: EF 39%, no ischemia; b. 09/2015 Echo: EF 40-45%.  . Right Humerus head fracture    a. 07/2016 - managed conservatively.  . 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 ABDOMINAL HYSTERECTOMY    . BREAST LUMPECTOMY    . CATARACT EXTRACTION    . CHOLECYSTECTOMY    . JOINT REPLACEMENT Right    Hip  . KNEE SURGERY Right     Family History  Problem Relation Age of Onset  . Stroke Mother   . Diabetes Father   . Heart disease Father   . Kidney disease Neg Hx   . Bladder Cancer Neg Hx   . Kidney cancer Neg Hx     Social History   Socioeconomic History  . Marital status: Married    Spouse name: Not on file  . Number of children: Not on file  . Years of education: Not on file  . Highest education level: Not on file  Social Needs  . Financial resource strain: Not on file  . Food insecurity - worry: Not on file  . Food insecurity - inability: Not on file  . Transportation needs - medical: Not on file  . Transportation needs - non-medical: Not on file  Occupational History  . Not on file  Tobacco Use  . Smoking status: Former Research scientist (life sciences)  . Smokeless tobacco: Never Used  . Tobacco comment: smoked in high school for a few years only  Substance and Sexual Activity  . Alcohol use: No    Alcohol/week: 0.0 oz  . Drug use: No  . Sexual activity: Not Currently  Other Topics Concern  . Not on file  Social History  Narrative  . Not on file     Current Outpatient Medications:  .  Cholecalciferol 2000 UNITS TABS, Take 1 tablet by mouth daily., Disp: , Rfl:  .  fluticasone (FLONASE) 50 MCG/ACT nasal spray, Place 2 sprays into both nostrils at bedtime., Disp: 16 g, Rfl: 5 .  lansoprazole (PREVACID) 30 MG capsule, Take by mouth., Disp: , Rfl:  .  loratadine (CLARITIN) 10 MG tablet, Take 1 tablet (10 mg total) by mouth at bedtime., Disp: 30 tablet, Rfl: 5 .  LORazepam (ATIVAN) 0.5 MG tablet, Take 1 tablet (0.5 mg total) by mouth 2 (two) times daily as needed for anxiety., Disp: 60 tablet, Rfl: 2 .  losartan (COZAAR) 100 MG tablet, Take 1 tablet (100 mg total) daily by mouth., Disp: 30 tablet, Rfl:  2 .  lovastatin (MEVACOR) 40 MG tablet, Take 1 tablet (40 mg total) by mouth daily., Disp: 90 tablet, Rfl: 0 .  metoprolol tartrate (LOPRESSOR) 50 MG tablet, TAKE 1 TABLET(50 MG) BY MOUTH TWICE DAILY, Disp: 180 tablet, Rfl: 1 .  Multiple Vitamins-Minerals (PRESERVISION/LUTEIN PO), Take by mouth., Disp: , Rfl:  .  Multiple Vitamins-Minerals (PRESERVISION/LUTEIN) CAPS, Take 1 each by mouth 2 (two) times daily., Disp: , Rfl:  .  polyethylene glycol powder (GLYCOLAX/MIRALAX) powder, 17 grams a day, Disp: , Rfl:  .  predniSONE (DELTASONE) 10 MG tablet, Take by mouth., Disp: , Rfl:  .  Probiotic Product (ACIDOPHILUS PROBIOTIC COMPLEX PO), Take by mouth., Disp: , Rfl:  .  traMADol (ULTRAM) 50 MG tablet, Take 1 tablet (50 mg total) by mouth 2 (two) times daily., Disp: 60 tablet, Rfl: 0 .  vancomycin (VANCOCIN) 1000 MG injection, vancomycin 1,000 mg intravenous injection, Disp: , Rfl:  .  azithromycin (ZITHROMAX Z-PAK) 250 MG tablet, Take 2 tablets (500 mg) on  Day 1,  followed by 1 tablet (250 mg) once daily on Days 2 through 5. (Patient not taking: Reported on 05/23/2017), Disp: 6 each, Rfl: 0  Allergies  Allergen Reactions  . 2,4-D Dimethylamine (Amisol) Other (See Comments)    Other Reaction: OTHER REACTION-IRRITATION TO Esophagous  . Celebrex [Celecoxib] Other (See Comments)     esophagitis Esophageal spasms    . Ciprofloxacin Other (See Comments)    Esophageal irritation  . Ibuprofen     Other reaction(s): Other (See Comments) reflux  . Metronidazole Other (See Comments)    neuropathy  . Naproxen Other (See Comments)    GI UPSET  . Other Other (See Comments)    Other Reaction: esophagitis     Review of Systems  Eyes: Negative for blurred vision.  Respiratory: Negative for shortness of breath.   Cardiovascular: Negative for chest pain and palpitations.  Musculoskeletal: Negative for myalgias.  Neurological: Negative for headaches.  Psychiatric/Behavioral: The patient is  nervous/anxious. The patient does not have insomnia.      Objective  Vitals:   05/23/17 1027  BP: 136/60  Pulse: 70  Resp: 16  Temp: 98.6 F (37 C)  TempSrc: Oral  SpO2: 98%  Weight: 169 lb 3.2 oz (76.7 kg)    Physical Exam  Constitutional: She is oriented to person, place, and time and well-developed, well-nourished, and in no distress.  HENT:  Head: Normocephalic and atraumatic.  Cardiovascular: Normal rate, regular rhythm and normal heart sounds.  No murmur heard. Pulmonary/Chest: Effort normal and breath sounds normal.  Abdominal: Soft. Bowel sounds are normal. There is no tenderness.  Musculoskeletal: She exhibits edema.  Neurological: She is alert and oriented to person,  place, and time.  Psychiatric: Mood, memory, affect and judgment normal.  Nursing note and vitals reviewed.       Assessment & Plan  1. Anxiety Symptoms of anxiety are stable and controlled on lorazepam taken up to twice daily as needed, refills provided - LORazepam (ATIVAN) 0.5 MG tablet; Take 1 tablet (0.5 mg total) by mouth 2 (two) times daily as needed for anxiety.  Dispense: 60 tablet; Refill: 2  2. Dyslipidemia Obtain FLP, continue on statin - Lipid panel - lovastatin (MEVACOR) 40 MG tablet; Take 1 tablet (40 mg total) by mouth daily.  Dispense: 90 tablet; Refill: 0  3. Essential hypertension BP stable on present antihypertensive treatment - losartan (COZAAR) 100 MG tablet; Take 1 tablet (100 mg total) by mouth daily.  Dispense: 30 tablet; Refill: 2   Elianie Hubers [facility] 05/23/2017 11:07 AM

## 2017-05-24 ENCOUNTER — Encounter: Payer: Self-pay | Admitting: Pulmonary Disease

## 2017-05-24 LAB — LIPID PANEL
CHOLESTEROL: 139 mg/dL (ref <200)
HDL: 48 mg/dL — AB (ref >50)
LDL CHOLESTEROL (CALC): 67 mg/dL
Non-HDL Cholesterol (Calc): 91 mg/dL (calc) (ref <130)
TRIGLYCERIDES: 163 mg/dL — AB (ref <150)
Total CHOL/HDL Ratio: 2.9 (calc) (ref <5.0)

## 2017-06-12 ENCOUNTER — Other Ambulatory Visit: Payer: Self-pay | Admitting: Family Medicine

## 2017-06-12 MED ORDER — LORATADINE 10 MG PO TABS: 10.0000 mg | ORAL_TABLET | Freq: Every day | ORAL | 5 refills | Status: DC

## 2017-06-12 NOTE — Telephone Encounter (Signed)
Copied from [place] [id number]. Topic: Quick Communication - Rx Refill/Question >> Jun 12, 2017 10:58 AM Synthia Innocent wrote: Medication:  loratadine (CLARITIN) 10 MG tablet    Has the patient contacted their pharmacy? Yes.     (Agent: If no, request that the patient contact the pharmacy for the refill.) No Refills available   Preferred Pharmacy (with phone number or street name): [facility] on [address]: Please be advised that RX refills may take up to 3 business days. We ask that you follow-up with your pharmacy.

## 2017-06-20 ENCOUNTER — Encounter: Payer: Self-pay | Admitting: Family Medicine

## 2017-06-20 ENCOUNTER — Ambulatory Visit (INDEPENDENT_AMBULATORY_CARE_PROVIDER_SITE_OTHER): Payer: Medicare Other | Admitting: Family Medicine

## 2017-06-20 VITALS — BP 136/68 | HR 67 | Temp 97.5°F | Resp 14 | Wt 170.1 lb

## 2017-06-20 DIAGNOSIS — J0101 Acute recurrent maxillary sinusitis: Secondary | ICD-10-CM

## 2017-06-20 MED ORDER — DOXYCYCLINE HYCLATE 100 MG PO TABS: 100.0000 mg | ORAL_TABLET | Freq: Two times a day (BID) | ORAL | 0 refills | Status: AC

## 2017-06-20 MED ORDER — MOMETASONE FUROATE 50 MCG/ACT NA SUSP: 2.0000 | Freq: Every day | NASAL | 2 refills | Status: DC

## 2017-06-20 NOTE — Progress Notes (Signed)
Name: Jodi Bullock   MRN: [id number]    DOB: 1924-09-17   Date:06/20/2017       Progress Note  Subjective  Chief Complaint  Chief Complaint  Patient presents with  . Cough    Dry cough; yellow to clear phlegm. Happen before. Onset little over a week    Cough  This is a new problem. The current episode started in the past 7 days (1+ week). The problem has been unchanged. The cough is productive of sputum (sometimes yelloish mucus.). Associated symptoms include rhinorrhea. Pertinent negatives include no chills, ear congestion, ear pain, fever, headaches, nasal congestion, sore throat or shortness of breath. Treatments tried: has finished a course of Z-pak in the last month takes Flonase every day.     Past Medical History:  Diagnosis Date  . Arthritis   . Bladder incontinence   . Breast cancer ([place]) 1998  . Chronic systolic CHF (congestive heart failure) ([place]) 01/10/2016   a. 09/2015 Echo: EF 40-45%, possible inf HK, mod PAH (PASP 19mmHg).  . Depression   . GERD (gastroesophageal reflux disease)   . History of kidney stones   . HLD (hyperlipidemia)   . Hypertension   . Neuropathy of both feet   . NICM (nonischemic cardiomyopathy) ([place])    a. 08/2015 Lexiscan MV: EF 39%, no ischemia; b. 09/2015 Echo: EF 40-45%.  . Right Humerus head fracture    a. 07/2016 - managed conservatively.  . 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 ABDOMINAL HYSTERECTOMY    . BREAST LUMPECTOMY    . CATARACT EXTRACTION    . CHOLECYSTECTOMY    . JOINT REPLACEMENT Right    Hip  . KNEE SURGERY Right     Family History  Problem Relation Age of Onset  . Stroke Mother   . Diabetes Father   . Heart disease Father   . Kidney disease Neg Hx   . Bladder Cancer Neg Hx   . Kidney cancer Neg Hx     Social History   Socioeconomic History  . Marital status: Married    Spouse name: Not on file  . Number of children: Not on file  . Years of education: Not  on file  . Highest education level: Not on file  Social Needs  . Financial resource strain: Not on file  . Food insecurity - worry: Not on file  . Food insecurity - inability: Not on file  . Transportation needs - medical: Not on file  . Transportation needs - non-medical: Not on file  Occupational History  . Not on file  Tobacco Use  . Smoking status: Former Research scientist (life sciences)  . Smokeless tobacco: Never Used  . Tobacco comment: smoked in high school for a few years only  Substance and Sexual Activity  . Alcohol use: No    Alcohol/week: 0.0 oz  . Drug use: No  . Sexual activity: Not Currently  Other Topics Concern  . Not on file  Social History Narrative  . Not on file     Current Outpatient Medications:  .  Cholecalciferol 2000 UNITS TABS, Take 1 tablet by mouth daily., Disp: , Rfl:  .  fluticasone (FLONASE) 50 MCG/ACT nasal spray, Place 2 sprays into both nostrils at bedtime., Disp: 16 g, Rfl: 5 .  lansoprazole (PREVACID) 30 MG capsule, Take by mouth., Disp: , Rfl:  .  loratadine (CLARITIN) 10 MG tablet, Take 1 tablet (10 mg total) by  mouth at bedtime., Disp: 30 tablet, Rfl: 5 .  LORazepam (ATIVAN) 0.5 MG tablet, Take 1 tablet (0.5 mg total) by mouth 2 (two) times daily as needed for anxiety., Disp: 60 tablet, Rfl: 2 .  losartan (COZAAR) 100 MG tablet, Take 1 tablet (100 mg total) by mouth daily., Disp: 30 tablet, Rfl: 2 .  lovastatin (MEVACOR) 40 MG tablet, Take 1 tablet (40 mg total) by mouth daily., Disp: 90 tablet, Rfl: 0 .  metoprolol tartrate (LOPRESSOR) 50 MG tablet, TAKE 1 TABLET(50 MG) BY MOUTH TWICE DAILY, Disp: 180 tablet, Rfl: 1 .  Multiple Vitamins-Minerals (PRESERVISION/LUTEIN PO), Take by mouth., Disp: , Rfl:  .  Multiple Vitamins-Minerals (PRESERVISION/LUTEIN) CAPS, Take 1 each by mouth 2 (two) times daily., Disp: , Rfl:  .  polyethylene glycol powder (GLYCOLAX/MIRALAX) powder, 17 grams a day, Disp: , Rfl:  .  predniSONE (DELTASONE) 10 MG tablet, Take by mouth., Disp: ,  Rfl:  .  Probiotic Product (ACIDOPHILUS PROBIOTIC COMPLEX PO), Take by mouth., Disp: , Rfl:  .  traMADol (ULTRAM) 50 MG tablet, Take 1 tablet (50 mg total) by mouth 2 (two) times daily., Disp: 60 tablet, Rfl: 0 .  vancomycin (VANCOCIN) 1000 MG injection, vancomycin 1,000 mg intravenous injection, Disp: , Rfl:   Allergies  Allergen Reactions  . 2,4-D Dimethylamine (Amisol) Other (See Comments)    Other Reaction: OTHER REACTION-IRRITATION TO Esophagous  . Celebrex [Celecoxib] Other (See Comments)     esophagitis Esophageal spasms    . Ciprofloxacin Other (See Comments)    Esophageal irritation  . Ibuprofen     Other reaction(s): Other (See Comments) reflux  . Metronidazole Other (See Comments)    neuropathy  . Naproxen Other (See Comments)    GI UPSET  . Other Other (See Comments)    Other Reaction: esophagitis     Review of Systems  Constitutional: Negative for chills and fever.  HENT: Positive for rhinorrhea. Negative for ear pain and sore throat.   Respiratory: Positive for cough. Negative for shortness of breath.   Neurological: Negative for headaches.      Objective  Vitals:   06/20/17 1014  BP: 136/68  Pulse: 67  Resp: 14  Temp: (!) 97.5 F (36.4 C)  TempSrc: Oral  SpO2: 97%  Weight: 170 lb 1.6 oz (77.2 kg)    Physical Exam  Constitutional: She is oriented to person, place, and time and well-developed, well-nourished, and in no distress.  HENT:  Head: Normocephalic and atraumatic.  Nose: Mucosal edema present. Right sinus exhibits maxillary sinus tenderness and frontal sinus tenderness. Left sinus exhibits maxillary sinus tenderness and frontal sinus tenderness.  Mouth/Throat: Posterior oropharyngeal erythema present.  Nasal mucosal inflammation, turbinate hypertrophy.  Neck: Neck supple.  Cardiovascular: Normal rate, regular rhythm, S1 normal, S2 normal and normal heart sounds.  No murmur heard. Pulmonary/Chest: Effort normal and breath sounds normal.  She has no wheezes.  Neurological: She is alert and oriented to person, place, and time.  Psychiatric: Mood, memory, affect and judgment normal.        Assessment & Plan  1. Acute recurrent maxillary sinusitis By history and exam, she has completed azithromycin in the last month, started on doxycycline and Nasonex - doxycycline (VIBRA-TABS) 100 MG tablet; Take 1 tablet (100 mg total) by mouth 2 (two) times daily for 7 days.  Dispense: 14 tablet; Refill: 0 - mometasone (NASONEX) 50 MCG/ACT nasal spray; Place 2 sprays into the nose daily.  Dispense: 17 g; Refill: 2   Okey Dupre  [facility] 06/20/2017 10:36 AM

## 2017-06-22 ENCOUNTER — Telehealth: Payer: Self-pay

## 2017-06-22 NOTE — Telephone Encounter (Signed)
Copied from [place]. Topic: General - Other >> Jun 22, 2017 11:39 AM Corie Chiquito, NT wrote: Reason for GAY:GEFUWTK calling because she would like to have Dr.Shah nurse to give her a call. Stated she has a few questions about her medication. She can be reached at [phone] or  [phone]. If no answer please leave a msg

## 2017-06-22 NOTE — Telephone Encounter (Signed)
Left a detail message asking her call back.

## 2017-06-22 NOTE — Telephone Encounter (Signed)
Was unable to take doxycycline, it was to strong, would like something else called in

## 2017-06-26 NOTE — Telephone Encounter (Signed)
The most appropriate and safest medicine for cough will be benzonatate 100 mg taken up to 3 times a day for 5 days. Please confirm and send a prescription to her pharmacy

## 2017-06-26 NOTE — Telephone Encounter (Addendum)
Left a message asking the patient to see if that is something she would like to do and if so what pharmacy she would like it sent to.

## 2017-06-26 NOTE — Telephone Encounter (Signed)
Pt states it was to strong pt took back to pharmacy. Pt states she still have a cough and she states she would like to have something for the cough. Pt   was going to say something to you yesterday but she was so tired and tied up with other appointments  That she forgot.

## 2017-06-26 NOTE — Telephone Encounter (Signed)
Patient was in our office yesterday with her husband who was being seen for medication refills. She did not mention anything related to doxycycline being ineffective. I would recommend that if her symptoms have resolved, she may not need a second antibiotic at all. Please advise.

## 2017-07-03 ENCOUNTER — Other Ambulatory Visit: Payer: Self-pay | Admitting: Internal Medicine

## 2017-07-03 ENCOUNTER — Telehealth: Payer: Self-pay | Admitting: Pulmonary Disease

## 2017-07-03 MED ORDER — IPRATROPIUM BROMIDE 0.06 % NA SOLN: 2.0000 | Freq: Four times a day (QID) | NASAL | 2 refills | Status: DC

## 2017-07-03 NOTE — Telephone Encounter (Signed)
Patient in office with spouse for cardio appt and is requesting medication rx for sinus issues   Patient c/o cough congestion and yellow drainage   She would like something before she leaves office today if possible

## 2017-07-03 NOTE — Telephone Encounter (Signed)
This is likely a viral infection. Take an over the counter medication for cough such as delsym, and prescribed ipratropium nasal spray.

## 2017-07-03 NOTE — Telephone Encounter (Signed)
Please advise on message below.

## 2017-07-04 ENCOUNTER — Ambulatory Visit (INDEPENDENT_AMBULATORY_CARE_PROVIDER_SITE_OTHER): Payer: Medicare Other | Admitting: Internal Medicine

## 2017-07-04 ENCOUNTER — Encounter: Payer: Self-pay | Admitting: Internal Medicine

## 2017-07-04 VITALS — BP 142/78 | HR 85 | Resp 16 | Ht 62.0 in | Wt 169.0 lb

## 2017-07-04 DIAGNOSIS — R058 Other specified cough: Secondary | ICD-10-CM

## 2017-07-04 DIAGNOSIS — R053 Chronic cough: Secondary | ICD-10-CM

## 2017-07-04 DIAGNOSIS — J329 Chronic sinusitis, unspecified: Secondary | ICD-10-CM | POA: Diagnosis not present

## 2017-07-04 DIAGNOSIS — R05 Cough: Secondary | ICD-10-CM | POA: Diagnosis not present

## 2017-07-04 DIAGNOSIS — R0982 Postnasal drip: Secondary | ICD-10-CM | POA: Diagnosis not present

## 2017-07-04 MED ORDER — IPRATROPIUM BROMIDE 0.06 % NA SOLN: 2.0000 | Freq: Four times a day (QID) | NASAL | 2 refills | Status: DC

## 2017-07-04 NOTE — Telephone Encounter (Signed)
Pt informed to keep her appt. Nothing further needed.

## 2017-07-04 NOTE — Telephone Encounter (Signed)
Looks like we will have to have her come in.

## 2017-07-04 NOTE — Telephone Encounter (Signed)
Informed pt that DR wants to send in Doxcycline 100mg  BID x 5 days. When informed she states that she doesn't want this because every time she takes a strong abx that she gets diarrhea. States this is the same abx Dr. Manuella Ghazi gave her before and she got cdiff and stated that is why she wants a less potent abx but stated that the sinus issues keep returning. Informed pt she needs to take the stronger one with food and/or yogurt since the others don't seem to clear it up. Pt denied Doxy and would like to know if we can give her something different.

## 2017-07-04 NOTE — Telephone Encounter (Signed)
Appt scheduled to come in today b/c patient would like an abx.

## 2017-07-04 NOTE — Telephone Encounter (Signed)
As she is 84 yrs old, I would prefer she stay home and I can prescribe an abx (unless she specifically would like to be seen by physician).

## 2017-07-04 NOTE — Progress Notes (Signed)
PULMONARY OFFICE FOLLOW UP NOTE  Requesting MD/Service: Manuella Ghazi, MD Date of initial consultation: 09/25/16 Reason for consultation: Cough  PT PROFILE: 84 y.o. female never smoker referred for evaluation and mgmt of chronic cough. Wheezing noted on exam. Initial plan: Continue PPI and nasal steroid. Add Breo inhaler  INTERVAL: No major events  SUBJ:  At her last visit here she was seen by Dr. Alva Garnet, at that time she was continued on Flonase, and Claritin, prescribed a Z-Pak.  Today she comes in with worsening cough, which she attributes to sinus drainage.  She notes that she is having excess sinus drainage and she is having a sensation of mucus secretions being stuck in her throat that she is having to cough constantly.  She has been taking the Flonase and Claritin as directed.  Patient is requesting an antibiotic today, but notes that she has been diagnosed with C. difficile, and therefore is worried about taking antibiotics.  She denies chest pain, hemoptysis, PND, the remainder of the review of systems was reviewed with the patient was found to be negative.    Vitals:   07/04/17 1125 07/04/17 1129  BP:  (!) 142/78  Pulse:  85  Resp: 16   SpO2:  92%  Weight: 169 lb (76.7 kg)   Height: 5\' 2"  (1.575 m)      EXAM:  Gen: NAD HEENT: NCAT, mild to moderate bilateral rhinitis Neck: No LAN or JVD Lungs: No wheezes or other adventitious sounds Cardiovascular: RRR, no murmurs noted Abdomen: Soft, nontender, normal BS Ext:  no C/C/E Neuro: grossly intact  DATA:   BMP Latest Ref Rng & Units 05/17/2016 01/23/2016 09/22/2015  Glucose 65 - 99 mg/dL 104(H) 147(H) 104(H)  BUN 7 - 25 mg/dL 15 21(H) 14  Creatinine 0.60 - 0.88 mg/dL 0.76 0.82 0.76  BUN/Creat Ratio 12 - 28 - - 18  Sodium 135 - 146 mmol/L 142 141 143  Potassium 3.5 - 5.3 mmol/L 5.3 4.2 4.3  Chloride 98 - 110 mmol/L 105 108 103  CO2 20 - 31 mmol/L 30 24 25   Calcium 8.6 - 10.4 mg/dL 9.2 8.9 9.1    CBC Latest Ref Rng & Units  01/23/2016 08/31/2015 06/21/2015  WBC 3.6 - 11.0 K/uL 7.4 7.4 7.0  Hemoglobin 12.0 - 16.0 g/dL 14.0 13.9 13.5  Hematocrit 35.0 - 47.0 % 40.1 41.9 41.4  Platelets 150 - 440 K/uL 236 179 157    CXR: No new film  IMPRESSION:   Acute on chronic cough with acute bronchitis, likely secondary to rhinosinusitis. Upper airway cough syndrome Acute purulent bronchitis  PLAN:  Continue Flonase inhaler and Claritin as previously prescribed Patient is prescribed ipratropium nasal spray 2 sprays in each nostril 4 times daily, in addition I have asked her to take Delsym over-the-counter (she has trouble swallowing Mucinex tablets) to help with mucus clearance. I did discuss with her that given her history of C. difficile I would prefer not to prescribe antibiotics at this time. Follow-up in 3 months with Dr. Jamal Collin.  Marda Stalker, MD.   Board Certified in Internal Medicine, Pulmonary Medicine, [place], and Sleep Medicine.  [facility] Office Number: [phone] Pager: [phone]  Patricia Pesa, M.D.  Merton Border, M.D  07/04/2017 11:36 AM

## 2017-07-04 NOTE — Telephone Encounter (Signed)
LMOVM for pt to return call 

## 2017-07-04 NOTE — Patient Instructions (Addendum)
Will prescribe ipratropium nasal spray, 2 sprays in each nostril 4 times per day.  Take delsym cough syrup as needed.

## 2017-08-06 ENCOUNTER — Encounter: Payer: Self-pay | Admitting: Urology

## 2017-08-06 ENCOUNTER — Ambulatory Visit (INDEPENDENT_AMBULATORY_CARE_PROVIDER_SITE_OTHER): Payer: Medicare Other | Admitting: Urology

## 2017-08-06 VITALS — BP 130/64 | HR 71 | Ht 62.0 in | Wt 172.5 lb

## 2017-08-06 DIAGNOSIS — N3941 Urge incontinence: Secondary | ICD-10-CM | POA: Diagnosis not present

## 2017-08-06 LAB — MICROSCOPIC EXAMINATION: RBC, UA: NONE SEEN /hpf (ref 0–2)

## 2017-08-06 LAB — URINALYSIS, COMPLETE
Bilirubin, UA: NEGATIVE
GLUCOSE, UA: NEGATIVE
LEUKOCYTES UA: NEGATIVE
Nitrite, UA: NEGATIVE
PH UA: 5.5 (ref 5.0–7.5)
RBC, UA: NEGATIVE
Specific Gravity, UA: >1.03 — ABNORMAL HIGH (ref 1.005–1.030)
Urobilinogen, Ur: 0.2 mg/dL (ref 0.2–1.0)

## 2017-08-06 NOTE — Progress Notes (Signed)
08/06/2017 2:58 PM   Jodi Bullock 10/26/1924 [id number]  Referring provider: Roselee Nova, MD [address] [place], [place] [address]  Chief Complaint  Patient presents with  . Urinary Incontinence    HPI: The patient has been assessed in our clinic multiple times.  She could not tolerate or benefit from all the overactive bladder medications.  She has had percutaneous tibial nerve stimulation.  She did not reach her treatment goal.  Nighttime symptoms were quite bothersome.  Today Some of the details of the history were a bit challenging.  She can void every 1 hour during the day or less so.  She is getting up 1-4 times a night.  She does have incontinence worsening.  She recently has not been getting bladder infections.  She no longer takes Lasix.  She takes prednisone as needed.  She has mild ankle edema  Modifying factors: There are no other modifying factors  Associated signs and symptoms: There are no other associated signs and symptoms Aggravating and relieving factors: There are no other aggravating or relieving factors Severity: Moderate Duration: Persistent     PMH: Past Medical History:  Diagnosis Date  . Arthritis   . Bladder incontinence   . Breast cancer ([place]) 1998  . Chronic systolic CHF (congestive heart failure) ([place]) 01/10/2016   a. 09/2015 Echo: EF 40-45%, possible inf HK, mod PAH (PASP 84mmHg).  . Depression   . GERD (gastroesophageal reflux disease)   . History of kidney stones   . HLD (hyperlipidemia)   . Hypertension   . Neuropathy of both feet   . NICM (nonischemic cardiomyopathy) ([place])    a. 08/2015 Lexiscan MV: EF 39%, no ischemia; b. 09/2015 Echo: EF 40-45%.  . Right Humerus head fracture    a. 07/2016 - managed conservatively.  . 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 ABDOMINAL HYSTERECTOMY    . BREAST LUMPECTOMY    . CATARACT EXTRACTION    .  CHOLECYSTECTOMY    . JOINT REPLACEMENT Right    Hip  . KNEE SURGERY Right     Home Medications:  Allergies as of 08/06/2017      Reactions   2,4-d Dimethylamine (amisol) Other (See Comments)   Other Reaction: OTHER REACTION-IRRITATION TO Esophagous   Celebrex [celecoxib] Other (See Comments)    esophagitis Esophageal spasms     Ciprofloxacin Other (See Comments)   Esophageal irritation   Ibuprofen    Other reaction(s): Other (See Comments) reflux   Metronidazole Other (See Comments)   neuropathy   Naproxen Other (See Comments)   GI UPSET   Other Other (See Comments)   Other Reaction: esophagitis      Medication List        Accurate as of 08/06/17  2:58 PM. Always use your most recent med list.          ACIDOPHILUS PROBIOTIC COMPLEX PO Take by mouth.   Cholecalciferol 2000 units Tabs Take 1 tablet by mouth daily.   ipratropium 0.06 % nasal spray Commonly known as:  ATROVENT Place 2 sprays into the nose 4 (four) times daily.   lansoprazole 30 MG capsule Commonly known as:  PREVACID Take by mouth.   loratadine 10 MG tablet Commonly known as:  CLARITIN Take 1 tablet (10 mg total) by mouth at bedtime.   LORazepam 0.5 MG tablet Commonly known as:  ATIVAN Take 1 tablet (0.5 mg  total) by mouth 2 (two) times daily as needed for anxiety.   losartan 100 MG tablet Commonly known as:  COZAAR Take 1 tablet (100 mg total) by mouth daily.   lovastatin 40 MG tablet Commonly known as:  MEVACOR Take 1 tablet (40 mg total) by mouth daily.   metoprolol tartrate 50 MG tablet Commonly known as:  LOPRESSOR TAKE 1 TABLET(50 MG) BY MOUTH TWICE DAILY   polyethylene glycol powder powder Commonly known as:  GLYCOLAX/MIRALAX 17 grams a day   PRESERVISION/LUTEIN Caps Take 1 each by mouth 2 (two) times daily.   traMADol 50 MG tablet Commonly known as:  ULTRAM Take 1 tablet (50 mg total) by mouth 2 (two) times daily.       Allergies:  Allergies  Allergen Reactions    . 2,4-D Dimethylamine (Amisol) Other (See Comments)    Other Reaction: OTHER REACTION-IRRITATION TO Esophagous  . Celebrex [Celecoxib] Other (See Comments)     esophagitis Esophageal spasms    . Ciprofloxacin Other (See Comments)    Esophageal irritation  . Ibuprofen     Other reaction(s): Other (See Comments) reflux  . Metronidazole Other (See Comments)    neuropathy  . Naproxen Other (See Comments)    GI UPSET  . Other Other (See Comments)    Other Reaction: esophagitis    Family History: Family History  Problem Relation Age of Onset  . Stroke Mother   . Diabetes Father   . Heart disease Father   . Kidney disease Neg Hx   . Bladder Cancer Neg Hx   . Kidney cancer Neg Hx     Social History:  reports that she has quit smoking. She has never used smokeless tobacco. She reports that she does not drink alcohol or use drugs.  ROS: UROLOGY Frequent Urination?: Yes Hard to postpone urination?: Yes Burning/pain with urination?: No Get up at night to urinate?: Yes Leakage of urine?: Yes Urine stream starts and stops?: No Trouble starting stream?: No Do you have to strain to urinate?: No Blood in urine?: No Urinary tract infection?: No Sexually transmitted disease?: No Injury to kidneys or bladder?: No Painful intercourse?: No Weak stream?: No Currently pregnant?: No Vaginal bleeding?: No Last menstrual period?: n  Gastrointestinal Nausea?: No Vomiting?: No Indigestion/heartburn?: No Diarrhea?: No Constipation?: No  Constitutional Fever: No Night sweats?: No Weight loss?: No Fatigue?: No  Skin Skin rash/lesions?: No Itching?: No  Eyes Blurred vision?: No Double vision?: No  Ears/Nose/Throat Sore throat?: No Sinus problems?: No  Hematologic/Lymphatic Swollen glands?: No Easy bruising?: No  Cardiovascular Leg swelling?: No Chest pain?: No  Respiratory Cough?: Yes Shortness of breath?: No  Endocrine Excessive thirst?:  No  Musculoskeletal Back pain?: Yes Joint pain?: Yes  Neurological Headaches?: No Dizziness?: No  Psychologic Depression?: No Anxiety?: Yes  Physical Exam: BP 130/64 (BP Location: Right Arm, Patient Position: Sitting, Cuff Size: Normal)   Pulse 71   Ht 5\' 2"  (1.575 m)   Wt 172 lb 8 oz (78.2 kg)   BMI 31.55 kg/m   Constitutional:  Alert and oriented, No acute distress.   Laboratory Data: Lab Results  Component Value Date   WBC 7.4 01/23/2016   HGB 14.0 01/23/2016   HCT 40.1 01/23/2016   MCV 89.7 01/23/2016   PLT 236 01/23/2016    Lab Results  Component Value Date   CREATININE 0.76 05/17/2016    No results found for: PSA  No results found for: TESTOSTERONE  Lab Results  Component Value Date  HGBA1C 5.4 02/14/2016    Urinalysis    Component Value Date/Time   COLORURINE STRAW (A) 01/24/2016 0005   APPEARANCEUR HAZY (A) 01/24/2016 0005   APPEARANCEUR Clear 01/25/2015 0854   LABSPEC 1.006 01/24/2016 0005   PHURINE 8.0 01/24/2016 0005   GLUCOSEU NEGATIVE 01/24/2016 0005   HGBUR NEGATIVE 01/24/2016 0005   BILIRUBINUR NEGATIVE 01/24/2016 0005   BILIRUBINUR Negative 01/25/2015 0854   KETONESUR NEGATIVE 01/24/2016 0005   PROTEINUR NEGATIVE 01/24/2016 0005   NITRITE NEGATIVE 01/24/2016 0005   LEUKOCYTESUR NEGATIVE 01/24/2016 0005   LEUKOCYTESUR Trace (A) 01/25/2015 0854    Pertinent Imaging: none  Assessment & Plan: Treatment options for nighttime frequency are limited.  We really do not have other treatments for her daytime symptoms.  I will send the urine for culture.  The role of desmopressin with pros cons risks and black box warning described.  5 weeks of low-dose desmopressin melt given.  Basic metabolic panel ordered today and we will call with results.  Do not start medication until called.  Serum sodium in about 1 week and see me in 4 weeks.  I felt that as long as her lab tests were normal that the medication was safe to take recognizing her age  and comorbidity.  The symptoms are significantly affecting her quality of life  1. Urge incontinence  - Urinalysis, Complete   No follow-ups on file.  Reece Packer, MD  [facility] [address] [place], [place] [address] [phone]

## 2017-08-06 NOTE — Patient Instructions (Signed)
You will not start the medication that was given today in office until we call you with the lab results. If lab looks good you will start medication. A lab appointment has been scheduled for a sodium check.  You will have a follow up appointment in 4 weeks.

## 2017-08-07 LAB — BASIC METABOLIC PANEL
BUN/Creatinine Ratio: 26 (ref 12–28)
BUN: 18 mg/dL (ref 10–36)
CHLORIDE: 105 mmol/L (ref 96–106)
CO2: 25 mmol/L (ref 20–29)
Calcium: 9 mg/dL (ref 8.7–10.3)
Creatinine, Ser: 0.69 mg/dL (ref 0.57–1.00)
GFR calc Af Amer: 89 mL/min/{1.73_m2} (ref >59)
GFR calc non Af Amer: 77 mL/min/{1.73_m2} (ref >59)
GLUCOSE: 135 mg/dL — AB (ref 65–99)
Potassium: 4.3 mmol/L (ref 3.5–5.2)
Sodium: 144 mmol/L (ref 134–144)

## 2017-08-14 ENCOUNTER — Other Ambulatory Visit: Payer: Medicare Other

## 2017-08-14 DIAGNOSIS — N3941 Urge incontinence: Secondary | ICD-10-CM

## 2017-08-15 LAB — SODIUM: Sodium: 145 mmol/L — ABNORMAL HIGH (ref 134–144)

## 2017-08-16 ENCOUNTER — Telehealth: Payer: Self-pay

## 2017-08-16 ENCOUNTER — Telehealth: Payer: Self-pay | Admitting: Family Medicine

## 2017-08-16 NOTE — Telephone Encounter (Signed)
-----   Message from Bjorn Loser, MD sent at 08/16/2017 11:21 AM EDT ----- Serum NA normal  ----- Message ----- From: Kyra Manges, CMA Sent: 08/15/2017   8:30 AM To: Bjorn Loser, MD    ----- Message ----- From: Interface, [facility] Lab Results In Sent: 08/15/2017   5:42 AM To: Rowe [facility] Clinical

## 2017-08-16 NOTE — Telephone Encounter (Signed)
Pt called stating she read the booklet that was packaged with Nocdurna and now has concerns. Pt stated that she has heart failure and hypertension. Pt also stated that booklet advises against pt taking nocdurna if has those problems. Please advise.

## 2017-08-16 NOTE — Telephone Encounter (Signed)
Patient notified and will start [place]

## 2017-08-20 NOTE — Telephone Encounter (Signed)
If she has concerns re medicine nocdurna OK not to take It is safe but she is elderly and I respect her concerns

## 2017-08-21 NOTE — Telephone Encounter (Signed)
Spoke with pt in reference to not taking nocdurna. Pt stated she would bring medication back over and cancel all appts.

## 2017-08-22 ENCOUNTER — Telehealth: Payer: Self-pay | Admitting: Urology

## 2017-08-22 NOTE — Telephone Encounter (Signed)
Pt brought back samples of Nocdurna.  She said after reading the literature, she didn't feel comfortable taking this.  Just F.Y.I.

## 2017-08-30 ENCOUNTER — Ambulatory Visit (INDEPENDENT_AMBULATORY_CARE_PROVIDER_SITE_OTHER): Payer: Medicare Other | Admitting: Pulmonary Disease

## 2017-08-30 ENCOUNTER — Encounter: Payer: Self-pay | Admitting: Pulmonary Disease

## 2017-08-30 VITALS — BP 128/76 | HR 50 | Ht 62.0 in | Wt 171.0 lb

## 2017-08-30 DIAGNOSIS — J410 Simple chronic bronchitis: Secondary | ICD-10-CM | POA: Diagnosis not present

## 2017-08-30 DIAGNOSIS — R05 Cough: Secondary | ICD-10-CM | POA: Diagnosis not present

## 2017-08-30 DIAGNOSIS — R053 Chronic cough: Secondary | ICD-10-CM

## 2017-08-30 DIAGNOSIS — R058 Other specified cough: Secondary | ICD-10-CM

## 2017-08-30 MED ORDER — LORATADINE 10 MG PO TABS: 10.0000 mg | ORAL_TABLET | Freq: Every day | ORAL | 5 refills | Status: DC

## 2017-08-30 MED ORDER — FLUTICASONE FUROATE 100 MCG/ACT IN AEPB: 1.0000 | INHALATION_SPRAY | Freq: Every day | RESPIRATORY_TRACT | 10 refills | Status: DC

## 2017-08-30 MED ORDER — AZITHROMYCIN 250 MG PO TABS: ORAL_TABLET | ORAL | 0 refills | Status: AC

## 2017-08-30 MED ORDER — FLUTICASONE PROPIONATE 50 MCG/ACT NA SUSP: 1.0000 | Freq: Every day | NASAL | 2 refills | Status: DC

## 2017-08-30 NOTE — Progress Notes (Signed)
PULMONARY OFFICE FOLLOW UP NOTE  Requesting MD/Service: Manuella Ghazi, MD Date of initial consultation: 09/25/16 Reason for consultation: Cough  PT PROFILE: 84 y.o. female never smoker referred for evaluation and mgmt of chronic cough. Wheezing noted on exam. Initial plan: Continue PPI and nasal steroid. Add Breo inhaler  INTERVAL: Last seen by me 05/21/17 at which time I prescribed a Z-Pak.  Seen in my absence by Dr. Ashby Dawes on 07/04/17.  He added Atrovent nasal spray and recommended over-the-counter cough medications.  No antibiotics were prescribed at that time.    SUBJ:  She is no better at this time.  She reports that her cough is still intermittently "terrible".  She is bringing up a moderate amount of mucus which is lightly discolored (yellow).  She has no major symptoms of shortness of breath or chest pain.  She has had no fevers.  She denies hemoptysis.  She has increased left ankle edema with pain and expresses a concern that she has a stress ulcer in that foot or ankle.  He does have chronic ankle and pedal edema.   Vitals:   08/30/17 1425 08/30/17 1430  BP:  128/76  Pulse:  (!) 50  SpO2:  92%  Weight: 171 lb (77.6 kg)   Height: 5\' 2"  (1.575 m)   RA   EXAM:   Gen: NAD HEENT: NCAT, sclerae white Lungs: full BS, no wheezes or other adventitious sounds Cardiovascular: Regular, no M noted Abdomen: Soft, NT, +BS Ext: Left greater than right ankle edema Neuro: grossly intact   DATA:   BMP Latest Ref Rng & Units 08/14/2017 08/06/2017 05/17/2016  Glucose 65 - 99 mg/dL - 135(H) 104(H)  BUN 10 - 36 mg/dL - 18 15  Creatinine 0.57 - 1.00 mg/dL - 0.69 0.76  BUN/Creat Ratio 12 - 28 - 26 -  Sodium 134 - 144 mmol/L 145(H) 144 142  Potassium 3.5 - 5.2 mmol/L - 4.3 5.3  Chloride 96 - 106 mmol/L - 105 105  CO2 20 - 29 mmol/L - 25 30  Calcium 8.7 - 10.3 mg/dL - 9.0 9.2    CBC Latest Ref Rng & Units 01/23/2016 08/31/2015 06/21/2015  WBC 3.6 - 11.0 K/uL 7.4 7.4 7.0  Hemoglobin 12.0 -  16.0 g/dL 14.0 13.9 13.5  Hematocrit 35.0 - 47.0 % 40.1 41.9 41.4  Platelets 150 - 440 K/uL 236 179 157    CXR: No new film  IMPRESSION:    Chronic cough with acute exacerbation Acute on chronic bronchitis Upper airway cough syndrome  PLAN:  Z-Pak prescribed Begin Arnuity inhaler - one inhalation daily Continue Flonase and Claritin as previously prescribed Follow-up in 3-4 months or sooner as needed   Merton Border, MD PCCM service Mobile [phone] Pager [phone] 08/30/2017 2:32 PM

## 2017-08-30 NOTE — Patient Instructions (Signed)
Azithromycin (Z-Pak) prescribed  Begin Arnuity - one inhalation daily  Continue Flonase and Claritin as previously prescribed  Follow up in 3-4 months

## 2017-09-03 ENCOUNTER — Ambulatory Visit: Payer: Self-pay | Admitting: Urology

## 2017-09-07 ENCOUNTER — Telehealth: Payer: Self-pay | Admitting: Pulmonary Disease

## 2017-09-07 MED ORDER — BENZONATATE 200 MG PO CAPS: 200.0000 mg | ORAL_CAPSULE | Freq: Three times a day (TID) | ORAL | 1 refills | Status: DC | PRN

## 2017-09-07 NOTE — Telephone Encounter (Signed)
Called patient and made aware Tessalon perles sent to her pharmacy.

## 2017-09-07 NOTE — Telephone Encounter (Signed)
Pt states she is no better, even after he antibioic, states she is on her inahler, but is still coughing badly.

## 2017-09-07 NOTE — Telephone Encounter (Signed)
Start tessalon 200 mg three times daily.

## 2017-09-09 ENCOUNTER — Emergency Department: Payer: Medicare Other

## 2017-09-09 ENCOUNTER — Other Ambulatory Visit: Payer: Self-pay

## 2017-09-09 ENCOUNTER — Inpatient Hospital Stay
Admission: EM | Admit: 2017-09-09 | Discharge: 2017-09-14 | DRG: 535 | Disposition: A | Discharge status: Skilled Nursing Facility | Payer: Medicare Other | Attending: Internal Medicine | Admitting: Internal Medicine

## 2017-09-09 ENCOUNTER — Encounter: Payer: Self-pay | Admitting: Emergency Medicine

## 2017-09-09 DIAGNOSIS — Z886 Allergy status to analgesic agent status: Secondary | ICD-10-CM

## 2017-09-09 DIAGNOSIS — S728X1A Other fracture of right femur, initial encounter for closed fracture: Secondary | ICD-10-CM

## 2017-09-09 DIAGNOSIS — Z853 Personal history of malignant neoplasm of breast: Secondary | ICD-10-CM

## 2017-09-09 DIAGNOSIS — Z85238 Personal history of other malignant neoplasm of thymus: Secondary | ICD-10-CM

## 2017-09-09 DIAGNOSIS — W010XXA Fall on same level from slipping, tripping and stumbling without subsequent striking against object, initial encounter: Secondary | ICD-10-CM | POA: Diagnosis present

## 2017-09-09 DIAGNOSIS — G629 Polyneuropathy, unspecified: Secondary | ICD-10-CM | POA: Diagnosis present

## 2017-09-09 DIAGNOSIS — I5022 Chronic systolic (congestive) heart failure: Secondary | ICD-10-CM | POA: Diagnosis present

## 2017-09-09 DIAGNOSIS — M1990 Unspecified osteoarthritis, unspecified site: Secondary | ICD-10-CM | POA: Diagnosis present

## 2017-09-09 DIAGNOSIS — Z96641 Presence of right artificial hip joint: Secondary | ICD-10-CM | POA: Diagnosis present

## 2017-09-09 DIAGNOSIS — Y92098 Other place in other non-institutional residence as the place of occurrence of the external cause: Secondary | ICD-10-CM

## 2017-09-09 DIAGNOSIS — Z7951 Long term (current) use of inhaled steroids: Secondary | ICD-10-CM

## 2017-09-09 DIAGNOSIS — S72114A Nondisplaced fracture of greater trochanter of right femur, initial encounter for closed fracture: Secondary | ICD-10-CM | POA: Diagnosis not present

## 2017-09-09 DIAGNOSIS — T50995A Adverse effect of other drugs, medicaments and biological substances, initial encounter: Secondary | ICD-10-CM | POA: Diagnosis present

## 2017-09-09 DIAGNOSIS — Z87442 Personal history of urinary calculi: Secondary | ICD-10-CM

## 2017-09-09 DIAGNOSIS — Z8249 Family history of ischemic heart disease and other diseases of the circulatory system: Secondary | ICD-10-CM

## 2017-09-09 DIAGNOSIS — Z881 Allergy status to other antibiotic agents status: Secondary | ICD-10-CM

## 2017-09-09 DIAGNOSIS — K219 Gastro-esophageal reflux disease without esophagitis: Secondary | ICD-10-CM | POA: Diagnosis present

## 2017-09-09 DIAGNOSIS — Z79899 Other long term (current) drug therapy: Secondary | ICD-10-CM

## 2017-09-09 DIAGNOSIS — I110 Hypertensive heart disease with heart failure: Secondary | ICD-10-CM | POA: Diagnosis present

## 2017-09-09 DIAGNOSIS — S7290XA Unspecified fracture of unspecified femur, initial encounter for closed fracture: Secondary | ICD-10-CM

## 2017-09-09 DIAGNOSIS — E785 Hyperlipidemia, unspecified: Secondary | ICD-10-CM | POA: Diagnosis present

## 2017-09-09 DIAGNOSIS — Z888 Allergy status to other drugs, medicaments and biological substances status: Secondary | ICD-10-CM

## 2017-09-09 DIAGNOSIS — Z87891 Personal history of nicotine dependence: Secondary | ICD-10-CM

## 2017-09-09 DIAGNOSIS — G92 Toxic encephalopathy: Secondary | ICD-10-CM | POA: Diagnosis present

## 2017-09-09 DIAGNOSIS — Z90710 Acquired absence of both cervix and uterus: Secondary | ICD-10-CM

## 2017-09-09 LAB — COMPREHENSIVE METABOLIC PANEL
ALBUMIN: 3.8 g/dL (ref 3.5–5.0)
ALT: 45 U/L (ref 14–54)
ANION GAP: 9 (ref 5–15)
AST: 36 U/L (ref 15–41)
Alkaline Phosphatase: 72 U/L (ref 38–126)
BUN: 15 mg/dL (ref 6–20)
CHLORIDE: 105 mmol/L (ref 101–111)
CO2: 27 mmol/L (ref 22–32)
Calcium: 9 mg/dL (ref 8.9–10.3)
Creatinine, Ser: 0.69 mg/dL (ref 0.44–1.00)
GFR calc Af Amer: >60 mL/min (ref >60)
GFR calc non Af Amer: >60 mL/min (ref >60)
GLUCOSE: 115 mg/dL — AB (ref 65–99)
Potassium: 4.2 mmol/L (ref 3.5–5.1)
SODIUM: 141 mmol/L (ref 135–145)
Total Bilirubin: 0.7 mg/dL (ref 0.3–1.2)
Total Protein: 7 g/dL (ref 6.5–8.1)

## 2017-09-09 LAB — CBC
HCT: 43.4 % (ref 35.0–47.0)
HEMOGLOBIN: 14.1 g/dL (ref 12.0–16.0)
MCH: 29.4 pg (ref 26.0–34.0)
MCHC: 32.5 g/dL (ref 32.0–36.0)
MCV: 90.7 fL (ref 80.0–100.0)
Platelets: 218 10*3/uL (ref 150–440)
RBC: 4.78 MIL/uL (ref 3.80–5.20)
RDW: 12.4 % (ref 11.5–14.5)
WBC: 13.5 10*3/uL — ABNORMAL HIGH (ref 3.6–11.0)

## 2017-09-09 LAB — MRSA PCR SCREENING: MRSA BY PCR: NEGATIVE

## 2017-09-09 MED ORDER — ACETAMINOPHEN 325 MG PO TABS
650.0000 mg | ORAL_TABLET | Freq: Once | ORAL | Status: AC
  Administered 2017-09-09: 650 mg via ORAL
  Filled 2017-09-09: qty 2

## 2017-09-09 MED ORDER — LOSARTAN POTASSIUM 50 MG PO TABS
100.0000 mg | ORAL_TABLET | Freq: Every day | ORAL | Status: DC
  Administered 2017-09-10 – 2017-09-14 (×5): 100 mg via ORAL
  Filled 2017-09-09 (×5): qty 2

## 2017-09-09 MED ORDER — LORATADINE 10 MG PO TABS
10.0000 mg | ORAL_TABLET | Freq: Every day | ORAL | Status: DC
  Administered 2017-09-09 – 2017-09-13 (×5): 10 mg via ORAL
  Filled 2017-09-09 (×5): qty 1

## 2017-09-09 MED ORDER — BUDESONIDE 0.5 MG/2ML IN SUSP
0.5000 mg | Freq: Two times a day (BID) | RESPIRATORY_TRACT | Status: DC
  Administered 2017-09-10 – 2017-09-14 (×9): 0.5 mg via RESPIRATORY_TRACT
  Filled 2017-09-09 (×10): qty 2

## 2017-09-09 MED ORDER — TRAMADOL HCL 50 MG PO TABS
50.0000 mg | ORAL_TABLET | Freq: Four times a day (QID) | ORAL | Status: DC | PRN
  Administered 2017-09-09 – 2017-09-10 (×4): 50 mg via ORAL
  Filled 2017-09-09 (×4): qty 1

## 2017-09-09 MED ORDER — SENNOSIDES-DOCUSATE SODIUM 8.6-50 MG PO TABS
1.0000 | ORAL_TABLET | Freq: Two times a day (BID) | ORAL | Status: DC
  Administered 2017-09-09 – 2017-09-14 (×7): 1 via ORAL
  Filled 2017-09-09 (×8): qty 1

## 2017-09-09 MED ORDER — METOPROLOL TARTRATE 25 MG PO TABS
25.0000 mg | ORAL_TABLET | Freq: Two times a day (BID) | ORAL | Status: DC
  Administered 2017-09-09 – 2017-09-10 (×2): 25 mg via ORAL
  Filled 2017-09-09 (×2): qty 1

## 2017-09-09 MED ORDER — IPRATROPIUM BROMIDE 0.06 % NA SOLN
2.0000 | Freq: Four times a day (QID) | NASAL | Status: DC
  Administered 2017-09-09 – 2017-09-12 (×4): 2 via NASAL
  Filled 2017-09-09: qty 15

## 2017-09-09 MED ORDER — TRAMADOL HCL 50 MG PO TABS
50.0000 mg | ORAL_TABLET | Freq: Once | ORAL | Status: AC
  Administered 2017-09-09: 50 mg via ORAL
  Filled 2017-09-09: qty 1

## 2017-09-09 MED ORDER — DOCUSATE SODIUM 100 MG PO CAPS
100.0000 mg | ORAL_CAPSULE | Freq: Two times a day (BID) | ORAL | Status: DC | PRN
  Administered 2017-09-10: 100 mg via ORAL
  Filled 2017-09-09: qty 1

## 2017-09-09 MED ORDER — FENTANYL CITRATE (PF) 100 MCG/2ML IJ SOLN: 25.0000 ug | Freq: Once | INTRAMUSCULAR | Status: DC

## 2017-09-09 MED ORDER — FLUTICASONE FUROATE 100 MCG/ACT IN AEPB: 1.0000 | INHALATION_SPRAY | Freq: Every day | RESPIRATORY_TRACT | Status: DC

## 2017-09-09 MED ORDER — LORAZEPAM 0.5 MG PO TABS
0.5000 mg | ORAL_TABLET | Freq: Two times a day (BID) | ORAL | Status: DC | PRN
  Administered 2017-09-10: 0.5 mg via ORAL
  Filled 2017-09-09: qty 1

## 2017-09-09 MED ORDER — HEPARIN SODIUM (PORCINE) 5000 UNIT/ML IJ SOLN
5000.0000 [IU] | Freq: Three times a day (TID) | INTRAMUSCULAR | Status: DC
  Administered 2017-09-09 – 2017-09-14 (×15): 5000 [IU] via SUBCUTANEOUS
  Filled 2017-09-09 (×15): qty 1

## 2017-09-09 NOTE — H&P (Signed)
[facility] NAME: Jodi Bullock    MR#:  [id number]  DATE OF BIRTH:  18-Dec-1924  DATE OF ADMISSION:  09/09/2017  PRIMARY CARE PHYSICIAN: Tracie Harrier, MD   REQUESTING/REFERRING PHYSICIAN: [email or web address]  CHIEF COMPLAINT:  , she fell down and since then was hurting in her right hip. Chief Complaint  Patient presents with  . Fall    HISTORY OF PRESENT ILLNESS: Jodi Bullock  is a 84 y.o. female with a known history of arthritis, bladder incontinence, breast cancer, chronic systolic CHF, depression, gastroesophageal reflux disease, hyperlipidemia, hypertension, thymus cancer- lives in [facility] with her husband. Her husband is having weakness and cough with low-grade fever for last 2 days and had an episode of stumbling while going to the cafeteria. While trying to help him, she lost her balance and fell down and since then having pain in her right hip. EMS brought her to emergency room, and noted to have some fracture around her previous prosthesis on the right femur. ER physician spoke to on call orthopedic doctor, he suggested no need for surgery but admit her for pain control and physical therapy evaluation. ER physician tried to make her walk, she had severe pain on walking a few steps and so he agreed to with observation under medical service.  PAST MEDICAL HISTORY:   Past Medical History:  Diagnosis Date  . Arthritis   . Bladder incontinence   . Breast cancer ([place]) 1998  . Chronic systolic CHF (congestive heart failure) ([place]) 01/10/2016   a. 09/2015 Echo: EF 40-45%, possible inf HK, mod PAH (PASP 21mmHg).  . Depression   . GERD (gastroesophageal reflux disease)   . History of kidney stones   . HLD (hyperlipidemia)   . Hypertension   . Neuropathy of both feet   . NICM (nonischemic cardiomyopathy) ([place])    a. 08/2015 Lexiscan MV: EF 39%, no ischemia; b. 09/2015 Echo: EF 40-45%.  . Right Humerus head  fracture    a. 07/2016 - managed conservatively.  . Thymus cancer [facility])    cancer    PAST SURGICAL HISTORY:  Past Surgical History:  Procedure Laterality Date  . ABDOMINAL HYSTERECTOMY    . BREAST LUMPECTOMY    . CATARACT EXTRACTION    . CHOLECYSTECTOMY    . JOINT REPLACEMENT Right    Hip  . KNEE SURGERY Right     SOCIAL HISTORY:  Social History   Tobacco Use  . Smoking status: Former Research scientist (life sciences)  . Smokeless tobacco: Never Used  . Tobacco comment: smoked in high school for a few years only  Substance Use Topics  . Alcohol use: No    Alcohol/week: 0.0 oz    FAMILY HISTORY:  Family History  Problem Relation Age of Onset  . Stroke Mother   . Diabetes Father   . Heart disease Father   . Kidney disease Neg Hx   . Bladder Cancer Neg Hx   . Kidney cancer Neg Hx     DRUG ALLERGIES:  Allergies  Allergen Reactions  . 2,4-D Dimethylamine (Amisol) Other (See Comments)    Other Reaction: OTHER REACTION-IRRITATION TO Esophagous  . Celebrex [Celecoxib] Other (See Comments)     esophagitis Esophageal spasms    . Ciprofloxacin Other (See Comments)    Esophageal irritation  . Ibuprofen     Other reaction(s): Other (See Comments) reflux  . Metronidazole Other (See Comments)    neuropathy  . Naproxen Other (See  Comments)    GI UPSET  . Other Other (See Comments)    Other Reaction: esophagitis    REVIEW OF SYSTEMS:   CONSTITUTIONAL: No fever, fatigue or weakness.  EYES: No blurred or double vision.  EARS, NOSE, AND THROAT: No tinnitus or ear pain.  RESPIRATORY: No cough, shortness of breath, wheezing or hemoptysis.  CARDIOVASCULAR: No chest pain, orthopnea, edema.  GASTROINTESTINAL: No nausea, vomiting, diarrhea or abdominal pain.  GENITOURINARY: No dysuria, hematuria.  ENDOCRINE: No polyuria, nocturia,  HEMATOLOGY: No anemia, easy bruising or bleeding SKIN: No rash or lesion. MUSCULOSKELETAL: No joint pain or arthritis.   NEUROLOGIC: No tingling, numbness, weakness.   PSYCHIATRY: No anxiety or depression.   MEDICATIONS AT HOME:  Prior to Admission medications   Medication Sig Start Date End Date Taking? Authorizing Provider  benzonatate (TESSALON) 200 MG capsule Take 1 capsule (200 mg total) by mouth 3 (three) times daily as needed for cough. 09/07/17  Yes Laverle Hobby, MD  Cholecalciferol 2000 UNITS TABS Take 1 tablet by mouth daily.   Yes Keith Rake Asad A, MD  fluticasone (FLONASE) 50 MCG/ACT nasal spray Place 1 spray into both nostrils daily. 08/30/17  Yes Wilhelmina Mcardle, MD  Fluticasone Furoate (ARNUITY ELLIPTA) 100 MCG/ACT AEPB Inhale 1 puff into the lungs daily. 08/30/17  Yes Wilhelmina Mcardle, MD  ipratropium (ATROVENT) 0.06 % nasal spray Place 2 sprays into the nose 4 (four) times daily. 07/04/17 09/09/17 Yes Laverle Hobby, MD  loratadine (CLARITIN) 10 MG tablet Take 1 tablet (10 mg total) by mouth at bedtime. 08/30/17 08/30/18 Yes Wilhelmina Mcardle, MD  LORazepam (ATIVAN) 0.5 MG tablet Take 1 tablet (0.5 mg total) by mouth 2 (two) times daily as needed for anxiety. 05/23/17  Yes Roselee Nova, MD  losartan (COZAAR) 100 MG tablet Take 1 tablet (100 mg total) by mouth daily. 05/23/17  Yes Roselee Nova, MD  lovastatin (MEVACOR) 40 MG tablet Take 1 tablet (40 mg total) by mouth daily. Patient taking differently: Take 40 mg by mouth daily. Pt takes 3 times weekly 05/23/17  Yes Keith Rake Asad A, MD  metoprolol tartrate (LOPRESSOR) 50 MG tablet TAKE 1 TABLET(50 MG) BY MOUTH TWICE DAILY 04/09/17  Yes Roselee Nova, MD  Multiple Vitamins-Minerals (PRESERVISION/LUTEIN) CAPS Take 1 each by mouth 2 (two) times daily.   Yes [provider]  polyethylene glycol powder (GLYCOLAX/MIRALAX) powder 17 grams a day 10/22/09   [provider]      PHYSICAL EXAMINATION:   VITAL SIGNS: Blood pressure (!) 167/83, pulse 84, temperature 98.2 F (36.8 C), resp. rate 16, height 5\' 2"  (1.575 m), weight 77.1 kg (170 lb), SpO2 96  %.  GENERAL:  84 y.o.-year-old patient lying in the bed with no acute distress.  EYES: Pupils equal, round, reactive to light and accommodation. No scleral icterus. Extraocular muscles intact.  HEENT: Head atraumatic, normocephalic. Oropharynx and nasopharynx clear.  NECK:  Supple, no jugular venous distention. No thyroid enlargement, no tenderness.  LUNGS: Normal breath sounds bilaterally, no wheezing, rales,rhonchi or crepitation. No use of accessory muscles of respiration.  CARDIOVASCULAR: S1, S2 normal. No murmurs, rubs, or gallops.  ABDOMEN: Soft, nontender, nondistended. Bowel sounds present. No organomegaly or mass.  EXTREMITIES: No pedal edema, cyanosis, or clubbing.  NEUROLOGIC: Cranial nerves II through XII are intact. Muscle strength 5/5 in all extremities, except right lower limb is 4 out of 5 due to pain in hip. Sensation intact. Gait not checked.  PSYCHIATRIC: The  patient is alert and oriented x 3.  SKIN: No obvious rash, lesion, or ulcer.   LABORATORY PANEL:   CBC Recent Labs  Lab 09/09/17 1405  WBC 13.5*  HGB 14.1  HCT 43.4  PLT 218  MCV 90.7  MCH 29.4  MCHC 32.5  RDW 12.4   ------------------------------------------------------------------------------------------------------------------  Chemistries  Recent Labs  Lab 09/09/17 1405  NA 141  K 4.2  CL 105  CO2 27  GLUCOSE 115*  BUN 15  CREATININE 0.69  CALCIUM 9.0  AST 36  ALT 45  ALKPHOS 72  BILITOT 0.7   ------------------------------------------------------------------------------------------------------------------ estimated creatinine clearance is 44.9 mL/min (by C-G formula based on SCr of 0.69 mg/dL). ------------------------------------------------------------------------------------------------------------------ No results for input(s): TSH, T4TOTAL, T3FREE, THYROIDAB in the last 72 hours.  Invalid input(s): FREET3   Coagulation profile No results for input(s): INR, PROTIME in the last  168 hours. ------------------------------------------------------------------------------------------------------------------- No results for input(s): DDIMER in the last 72 hours. -------------------------------------------------------------------------------------------------------------------  Cardiac Enzymes No results for input(s): CKMB, TROPONINI, MYOGLOBIN in the last 168 hours.  Invalid input(s): CK ------------------------------------------------------------------------------------------------------------------ Invalid input(s): POCBNP  ---------------------------------------------------------------------------------------------------------------  Urinalysis    Component Value Date/Time   COLORURINE STRAW (A) 01/24/2016 0005   APPEARANCEUR Clear 08/06/2017 1425   LABSPEC 1.006 01/24/2016 0005   PHURINE 8.0 01/24/2016 0005   GLUCOSEU Negative 08/06/2017 1425   HGBUR NEGATIVE 01/24/2016 0005   BILIRUBINUR Negative 08/06/2017 1425   KETONESUR NEGATIVE 01/24/2016 0005   PROTEINUR Trace (A) 08/06/2017 1425   PROTEINUR NEGATIVE 01/24/2016 0005   NITRITE Negative 08/06/2017 1425   NITRITE NEGATIVE 01/24/2016 0005   LEUKOCYTESUR Negative 08/06/2017 1425     RADIOLOGY: Dg Chest 1 View  Result Date: 09/09/2017 CLINICAL DATA:  Fall today with right hip and knee pain. EXAM: CHEST  1 VIEW COMPARISON:  07/20/2016 FINDINGS: Sternotomy wires unchanged. Lungs are adequately inflated with postsurgical changes over the right base, paramediastinal region and hilum. There is no lobar consolidation or effusion. Mild stable cardiomegaly. Calcified plaque over the aortic arch. No evidence of fracture. IMPRESSION: No acute findings. Mild stable cardiomegaly.  Postsurgical change over the right lung. Electronically Signed   By: Marin Olp M.D.   On: 09/09/2017 11:54   Dg Knee Complete 4 Views Right  Result Date: 09/09/2017 CLINICAL DATA:  Fall today with right hip and knee pain. EXAM: RIGHT  KNEE - COMPLETE 4+ VIEW COMPARISON:  01/02/2016 FINDINGS: Diffuse decreased bone density. Right total knee arthroplasty intact and unchanged. No acute fracture or dislocation. No joint effusion. Remainder of the exam is unchanged. IMPRESSION: No acute findings. Electronically Signed   By: Marin Olp M.D.   On: 09/09/2017 11:50   Dg Hip Unilat With Pelvis 2-3 Views Right  Result Date: 09/09/2017 CLINICAL DATA:  Fall today with right hip pain. EXAM: DG HIP (WITH OR WITHOUT PELVIS) 2-3V RIGHT COMPARISON:  None. FINDINGS: Unipolar right hip arthroplasty intact. Mild degenerate change of the left hip and symphysis pubis joint. Focal cortical step-off along the anterior aspect of the trochanteric region of the right hip seen only on the external rotation view which may be due to acute fracture and less likely postsurgical change. Remainder the exam is unremarkable. IMPRESSION: Unipolar right hip arthroplasty intact. Suggestion of fracture along the anterior cortex of the trochanteric region of the right hip seen only on the external rotation view. Electronically Signed   By: Marin Olp M.D.   On: 09/09/2017 11:48    EKG: Orders placed or performed in visit on 10/31/16  .  EKG 12-Lead    IMPRESSION AND PLAN:  * right femur fracture   Orthopedic consult, as he had told no need for surgery.   Pain management and physical therapy evaluation.  * hypertension   Continue home medications, currently elevated due to pain.  * hyperlipidemia   Continue home medication.  * chronic systolic CHF   Currently no signs of extubation, continue to monitor.  All the records are reviewed and case discussed with ED provider. Management plans discussed with the patient, family and they are in agreement.  CODE STATUS: Partial code.  Advance Directive Documentation     Most Recent Value  Type of Advance Directive  Living will  Pre-existing out of facility DNR order (yellow form or pink MOST form)  -   "MOST" Form in Place?  -      Patient's son was present in the room during my visit.  TOTAL TIME TAKING CARE OF THIS PATIENT: 50 minutes.    Vaughan Basta M.D on 09/09/2017   Between 7am to 6pm - Pager - [phone]  After 6pm go to [email or web address] - password [email or web address] [facility]  Office  [phone]  CC: Primary care physician; Tracie Harrier, MD   Note: This dictation was prepared with Dragon dictation along with smaller phrase technology. Any transcriptional errors that result from this process are unintentional.

## 2017-09-09 NOTE — ED Notes (Signed)
ED Provider at bedside. 

## 2017-09-09 NOTE — ED Notes (Signed)
Admitting MD at bedside.

## 2017-09-09 NOTE — ED Provider Notes (Signed)
[facility] Emergency Department Provider Note   ____________________________________________    I have reviewed the triage vital signs and the nursing notes.   HISTORY  Chief Complaint Fall     HPI Jodi Bullock is a 84 y.o. female who presents after a fall.  Patient reports she attempted to catch her husband who was his balance which caused her to lose her balance and she fell over onto her right side injuring her right hip and right knee.  Both of these have been replaced.  Denies head injury.  No blood thinners.  No neck pain.  No abdominal pain nausea vomiting.  No chest pain or shortness of breath.  No back pain.   Past Medical History:  Diagnosis Date  . Arthritis   . Bladder incontinence   . Breast cancer ([place]) 1998  . Chronic systolic CHF (congestive heart failure) ([place]) 01/10/2016   a. 09/2015 Echo: EF 40-45%, possible inf HK, mod PAH (PASP 38mmHg).  . Depression   . GERD (gastroesophageal reflux disease)   . History of kidney stones   . HLD (hyperlipidemia)   . Hypertension   . Neuropathy of both feet   . NICM (nonischemic cardiomyopathy) ([place])    a. 08/2015 Lexiscan MV: EF 39%, no ischemia; b. 09/2015 Echo: EF 40-45%.  . Right Humerus head fracture    a. 07/2016 - managed conservatively.  . Thymus cancer [facility])    cancer    Patient Active Problem List   Diagnosis Date Noted  . Abnormal gait 12/19/2016  . Acquired spondylolisthesis 12/19/2016  . Breast lump 12/19/2016  . Cervical spondylosis without myelopathy 12/19/2016  . Closed Colles' fracture 12/19/2016  . Closed fracture of base of neck of femur ([place]) 12/19/2016  . Closed fracture of lower end of radius and ulna 12/19/2016  . Closed fracture of neck of femur ([place]) 12/19/2016  . Spondylolisthesis, congenital 12/19/2016  . Contracture of joint of hand 12/19/2016  . Contracture of wrist joint 12/19/2016  . Contusion of hand 12/19/2016  . Contusion of hip 12/19/2016  .  Contusion of knee 12/19/2016  . Degeneration of intervertebral disc at C4-C5 level 12/19/2016  . Degeneration of lumbar intervertebral disc 12/19/2016  . Disorder of coccyx 12/19/2016  . Dry eyes 12/19/2016  . Enthesopathy 12/19/2016  . Enthesopathy of hip region 12/19/2016  . Hand joint pain 12/19/2016  . Hand joint stiff 12/19/2016  . Hip pain 12/19/2016  . Joint pain 12/19/2016  . Knee pain 12/19/2016  . Localized, primary osteoarthritis 12/19/2016  . Loose body in hip joint 12/19/2016  . Lumbar sprain 12/19/2016  . Abnormal mammogram 12/19/2016  . Meibomian gland dysfunction 12/19/2016  . Muscle weakness 12/19/2016  . Open fracture of lower end of forearm 12/19/2016  . Presbyopia 12/19/2016  . Regular astigmatism 12/19/2016  . Shoulder joint pain 12/19/2016  . Skin sensation disturbance 12/19/2016  . Sprain of ankle 12/19/2016  . Trochanteric bursitis 12/19/2016  . Wrist joint pain 12/19/2016  . Stiffness of wrist joint 12/19/2016  . Acute bilateral low back pain without sciatica 09/29/2016  . Acute bronchitis 06/19/2016  . Recurrent productive cough 06/14/2016  . Medicare annual wellness visit, subsequent 05/17/2016  . Fall in elderly patient 01/27/2016  . Sprain of wrist, right 01/26/2016  . Systolic CHF with reduced left ventricular function, NYHA class 3 ([place]) 01/10/2016  . Productive cough 11/19/2015  . Chronic systolic heart failure ([place]) 10/12/2015  . Dyspnea on exertion 08/31/2015  . Fatigue 08/31/2015  .  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[place]) 08/04/2015  . Irregular heart rhythm 07/22/2015  . Left arm pain 06/29/2015  . Dyslipidemia 06/03/2015  . Acute recurrent maxillary sinusitis 06/03/2015  . Oral mucosal lesion 05/26/2015  . Urge incontinence 02/14/2015  . Ankle edema 02/02/2015  . At risk for falling 01/25/2015  . Dizziness 01/25/2015  . Acid reflux 01/25/2015  . Big thyroid 01/25/2015  . Gravida 2 para 2 01/25/2015  . Personal history of malignant neoplasm of  breast 01/25/2015  . Arthritis, degenerative 01/25/2015  . Parity 2 01/25/2015  . Peripheral blood vessel disorder ([place]) 01/25/2015  . Need for vaccination 01/25/2015  . Pain in rectum 01/25/2015  . Reflux 01/25/2015  . Screening for depression 01/25/2015  . Disease of accessory sinus 01/25/2015  . Headache, temporal 01/25/2015  . Primary cancer of thymus ([place]) 01/25/2015  . Disease of thyroid gland 01/25/2015  . Avitaminosis D 01/25/2015  . Family history of diabetes mellitus in father 12/31/2014  . Fasting hyperglycemia 12/31/2014  . Hypertension 12/01/2014  . HLD (hyperlipidemia) 12/01/2014  . Anxiety 12/01/2014  . Myofascial pain 12/01/2014  . Breast CA ([place]) 11/24/2014  . Absence of bladder continence 11/24/2014  . Urgency of micturation 11/24/2014  . Common bile duct dilatation 10/20/2014  . Pancreatic cyst 10/20/2014  . Kidney stones 10/20/2014  . Neuritis or radiculitis due to rupture of lumbar intervertebral disc 06/15/2014  . Lumbar canal stenosis 06/15/2014  . Degenerative arthritis of lumbar spine 06/15/2014  . Lumbar radiculitis 06/15/2014  . Osteoarthritis of spine with radiculopathy, lumbar region 06/15/2014  . 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 Bloodgood disease 11/05/2012  . Arthritis of temporomandibular joint 06/24/2012  . Atypical chest pain 01/05/2012  . Breath shortness 12/04/2011  . Lung mass 11/15/2011  . Thyroid nodule 11/15/2011  . Age-related macular degeneration, dry 10/03/2011  . Disorder of peripheral nervous system 10/03/2011  . Chronic rhinitis 03/03/2011  . Carotid artery narrowing 03/01/2011  . Polypharmacy 12/02/2010    Past Surgical History:  Procedure Laterality Date  . ABDOMINAL HYSTERECTOMY    . BREAST LUMPECTOMY    . CATARACT EXTRACTION    . CHOLECYSTECTOMY    . JOINT REPLACEMENT Right    Hip  . KNEE SURGERY Right     Prior to Admission medications   Medication Sig Start Date End Date Taking? Authorizing Provider   benzonatate (TESSALON) 200 MG capsule Take 1 capsule (200 mg total) by mouth 3 (three) times daily as needed for cough. 09/07/17  Yes Laverle Hobby, MD  Cholecalciferol 2000 UNITS TABS Take 1 tablet by mouth daily.   Yes Keith Rake Asad A, MD  fluticasone (FLONASE) 50 MCG/ACT nasal spray Place 1 spray into both nostrils daily. 08/30/17  Yes Wilhelmina Mcardle, MD  Fluticasone Furoate (ARNUITY ELLIPTA) 100 MCG/ACT AEPB Inhale 1 puff into the lungs daily. 08/30/17  Yes Wilhelmina Mcardle, MD  ipratropium (ATROVENT) 0.06 % nasal spray Place 2 sprays into the nose 4 (four) times daily. 07/04/17 09/09/17 Yes Laverle Hobby, MD  loratadine (CLARITIN) 10 MG tablet Take 1 tablet (10 mg total) by mouth at bedtime. 08/30/17 08/30/18 Yes Wilhelmina Mcardle, MD  LORazepam (ATIVAN) 0.5 MG tablet Take 1 tablet (0.5 mg total) by mouth 2 (two) times daily as needed for anxiety. 05/23/17  Yes Roselee Nova, MD  losartan (COZAAR) 100 MG tablet Take 1 tablet (100 mg total) by mouth daily. 05/23/17  Yes Roselee Nova, MD  lovastatin (MEVACOR) 40 MG tablet Take 1 tablet (  40 mg total) by mouth daily. Patient taking differently: Take 40 mg by mouth daily. Pt takes 3 times weekly 05/23/17  Yes Keith Rake Asad A, MD  metoprolol tartrate (LOPRESSOR) 50 MG tablet TAKE 1 TABLET(50 MG) BY MOUTH TWICE DAILY 04/09/17  Yes Roselee Nova, MD  Multiple Vitamins-Minerals (PRESERVISION/LUTEIN) CAPS Take 1 each by mouth 2 (two) times daily.   Yes [provider]  polyethylene glycol powder (GLYCOLAX/MIRALAX) powder 17 grams a day 10/22/09   [provider]     Allergies 2,4-d dimethylamine (amisol); Celebrex [celecoxib]; Ciprofloxacin; Ibuprofen; Metronidazole; Naproxen; and Other  Family History  Problem Relation Age of Onset  . Stroke Mother   . Diabetes Father   . Heart disease Father   . Kidney disease Neg Hx   . Bladder Cancer Neg Hx   . Kidney cancer Neg Hx     Social History Social History     Tobacco Use  . Smoking status: Former Research scientist (life sciences)  . Smokeless tobacco: Never Used  . Tobacco comment: smoked in high school for a few years only  Substance Use Topics  . Alcohol use: No    Alcohol/week: 0.0 oz  . Drug use: No    Review of Systems  Constitutional: No fever/chills Eyes: No visual changes.  ENT: No sore throat. Cardiovascular: Denies chest pain. Respiratory: Denies shortness of breath. Gastrointestinal: No abdominal pain.  No nausea, no vomiting.   Genitourinary: No groin injury Musculoskeletal: As above Skin: Skin tear right elbow Neurological: Negative for headaches or focal weakness   ____________________________________________   PHYSICAL EXAM:  VITAL SIGNS: ED Triage Vitals  Enc Vitals Group     BP 09/09/17 1021 140/74     Pulse Rate 09/09/17 1021 74     Resp 09/09/17 1021 18     Temp 09/09/17 1021 98.2 F (36.8 C)     Temp src --      SpO2 09/09/17 1021 95 %     Weight 09/09/17 1023 77.1 kg (170 lb)     Height 09/09/17 1023 1.575 m (5\' 2" )     Head Circumference --      Peak Flow --      Pain Score 09/09/17 1023 5     Pain Loc --      Pain Edu? --      Excl. in [place]? --     Constitutional: Alert and oriented. . Pleasant and interactive Eyes: Conjunctivae are normal.  Head: Atraumatic. Nose: No congestion/rhinnorhea. Mouth/Throat: Mucous membranes are moist.   Neck:  Painless ROM, no vertebral tenderness to palpation, no pain with axial load Cardiovascular: Normal rate, regular rhythm.  Good peripheral circulation.  No chest wall tenderness to palpation Respiratory: Normal respiratory effort.  No retractions. Lungs CTAB. Gastrointestinal: Soft and nontender. No distention.  No CVA tenderness. Genitourinary: deferred Musculoskeletal: Mild tenderness palpation over the right knee, minimal swelling superiorly.  Right ankle normal.  Warm and well perfused distally.  Mild tenderness palpation along the right lateral hip, some pain with axial load  although the leg is not shortened or rotated Neurologic:  Normal speech and language. No gross focal neurologic deficits are appreciated.  Skin:  Skin is warm, dry.  3 cm skin tear right elbow Psychiatric: Mood and affect are normal. Speech and behavior are normal.  ____________________________________________   LABS (all labs ordered are listed, but only abnormal results are displayed)  Labs Reviewed  CBC - Abnormal; Notable for the following components:  Result Value   WBC 13.5 (*)    All other components within normal limits  COMPREHENSIVE METABOLIC PANEL - Abnormal; Notable for the following components:   Glucose, Bld 115 (*)    All other components within normal limits   ____________________________________________  EKG  None ____________________________________________  RADIOLOGY  X-ray hip/pelvis, anterior cortex fracture X-ray knee no fracture Chest x-ray no pneumonia ____________________________________________   PROCEDURES  Procedure(s) performed: No  Procedures   Critical Care performed: No ____________________________________________   INITIAL IMPRESSION / ASSESSMENT AND PLAN / ED COURSE  Pertinent labs & imaging results that were available during my care of the patient were reviewed by me and considered in my medical decision making (see chart for details).  Patient with mechanical fall as described above.  X-ray shows anterior cortex fracture, discussed with Dr. Sabra Heck of orthopedics who examined the x-rays and states the patient is able to weight-bear with a walker.  We attempted to have the patient ambulates but she is extremely unsteady and not safe for discharge to independent living  Will check labs, discussed with hospitalist for admission    ____________________________________________   FINAL CLINICAL IMPRESSION(S) / ED DIAGNOSES  Final diagnoses:  Other closed fracture of right femur, unspecified portion of femur, initial  encounter [facility])        Note:  This document was prepared using Dragon voice recognition software and may include unintentional dictation errors.    Lavonia Drafts, MD 09/09/17 1440

## 2017-09-09 NOTE — ED Triage Notes (Signed)
Pt arrives via [facility] from [facility] s/p mechanical fall. Currently c/o right hip pain and right knee pain. Bruising noted to right knee. Hx hip replacement on right side.

## 2017-09-09 NOTE — ED Notes (Signed)
Jinny Blossom, RN to transport pt to 1A-151 with nursing student and slide board to assist.

## 2017-09-09 NOTE — ED Notes (Signed)
Patient transported to XR. 

## 2017-09-09 NOTE — ED Notes (Signed)
This RN assisted pt to get on bedpan.

## 2017-09-09 NOTE — Progress Notes (Signed)
Family Meeting Note  Advance Directive:yes  Today a meeting took place with the Patient, spouse and son and daughter.  The following clinical team members were present during this meeting:MD  The following were discussed:Patient's diagnosis: femur fracture, Patient's progosis: Unable to determine and Goals for treatment: Continue present management   Patient and family had made it clear that in any adverse event we can give a trial of CPR, defibrillator, noninvasive ventilator, use of medications. But they would not like to have intubation or putting him on ventilator machine.   Additional follow-up to be provided: PT, PMD  Time spent during discussion:20 minutes  Vaughan Basta, MD

## 2017-09-10 ENCOUNTER — Encounter: Payer: Self-pay | Admitting: *Deleted

## 2017-09-10 LAB — BASIC METABOLIC PANEL
Anion gap: 7 (ref 5–15)
BUN: 16 mg/dL (ref 6–20)
CO2: 28 mmol/L (ref 22–32)
Calcium: 8.7 mg/dL — ABNORMAL LOW (ref 8.9–10.3)
Chloride: 106 mmol/L (ref 101–111)
Creatinine, Ser: 0.63 mg/dL (ref 0.44–1.00)
GFR calc non Af Amer: >60 mL/min (ref >60)
Glucose, Bld: 114 mg/dL — ABNORMAL HIGH (ref 65–99)
POTASSIUM: 3.7 mmol/L (ref 3.5–5.1)
SODIUM: 141 mmol/L (ref 135–145)

## 2017-09-10 LAB — CBC
HEMATOCRIT: 38.3 % (ref 35.0–47.0)
Hemoglobin: 13 g/dL (ref 12.0–16.0)
MCH: 30.8 pg (ref 26.0–34.0)
MCHC: 33.9 g/dL (ref 32.0–36.0)
MCV: 90.9 fL (ref 80.0–100.0)
Platelets: 190 10*3/uL (ref 150–440)
RBC: 4.22 MIL/uL (ref 3.80–5.20)
RDW: 13 % (ref 11.5–14.5)
WBC: 7.1 10*3/uL (ref 3.6–11.0)

## 2017-09-10 MED ORDER — POLYETHYLENE GLYCOL 3350 17 G PO PACK
17.0000 g | PACK | Freq: Every day | ORAL | Status: DC
  Administered 2017-09-10 – 2017-09-11 (×2): 17 g via ORAL
  Filled 2017-09-10 (×2): qty 1

## 2017-09-10 MED ORDER — METOPROLOL TARTRATE 50 MG PO TABS
50.0000 mg | ORAL_TABLET | Freq: Two times a day (BID) | ORAL | Status: DC
  Administered 2017-09-10 – 2017-09-14 (×8): 50 mg via ORAL
  Filled 2017-09-10 (×8): qty 1

## 2017-09-10 MED ORDER — BENZONATATE 100 MG PO CAPS
200.0000 mg | ORAL_CAPSULE | Freq: Three times a day (TID) | ORAL | Status: DC | PRN
  Administered 2017-09-10 – 2017-09-14 (×3): 200 mg via ORAL
  Filled 2017-09-10 (×3): qty 2

## 2017-09-10 MED ORDER — HYDRALAZINE HCL 20 MG/ML IJ SOLN
10.0000 mg | Freq: Four times a day (QID) | INTRAMUSCULAR | Status: DC | PRN
  Administered 2017-09-10: 10 mg via INTRAVENOUS
  Filled 2017-09-10: qty 1

## 2017-09-10 NOTE — Progress Notes (Signed)
[place] [facility] [place] NAME: Jodi Bullock    MR#:  [id number]  DATE OF BIRTH:  1924/10/28  SUBJECTIVE:  patient came in after mechanical fall. She was trying to help her husband and they both landed up on the floor. Complains of right hip pain. REVIEW OF SYSTEMS:   Review of Systems  Constitutional: Negative for chills, fever and weight loss.  HENT: Negative for ear discharge, ear pain and nosebleeds.   Eyes: Negative for blurred vision, pain and discharge.  Respiratory: Negative for sputum production, shortness of breath, wheezing and stridor.   Cardiovascular: Negative for chest pain, palpitations, orthopnea and PND.  Gastrointestinal: Negative for abdominal pain, diarrhea, nausea and vomiting.  Genitourinary: Negative for frequency and urgency.  Musculoskeletal: Positive for joint pain. Negative for back pain.  Neurological: Negative for sensory change, speech change, focal weakness and weakness.  Psychiatric/Behavioral: Negative for depression and hallucinations. The patient is not nervous/anxious.    Tolerating Diet:yes Tolerating PT: HHPT  DRUG ALLERGIES:   Allergies  Allergen Reactions  . 2,4-D Dimethylamine (Amisol) Other (See Comments)    Other Reaction: OTHER REACTION-IRRITATION TO Esophagous  . Celebrex [Celecoxib] Other (See Comments)     esophagitis Esophageal spasms    . Ciprofloxacin Other (See Comments)    Esophageal irritation  . Ibuprofen     Other reaction(s): Other (See Comments) reflux  . Metronidazole Other (See Comments)    neuropathy  . Naproxen Other (See Comments)    GI UPSET  . Other Other (See Comments)    Other Reaction: esophagitis    VITALS:  Blood pressure (!) 151/64, pulse 73, temperature 97.8 F (36.6 C), temperature source Oral, resp. rate 16, height 5\' 2"  (1.575 m), weight 77.1 kg (170 lb), SpO2 94 %.  PHYSICAL EXAMINATION:   Physical Exam  GENERAL:  84 y.o.-year-old patient  lying in the bed with no acute distress.  EYES: Pupils equal, round, reactive to light and accommodation. No scleral icterus. Extraocular muscles intact.  HEENT: Head atraumatic, normocephalic. Oropharynx and nasopharynx clear.  NECK:  Supple, no jugular venous distention. No thyroid enlargement, no tenderness.  LUNGS: Normal breath sounds bilaterally, no wheezing, rales, rhonchi. No use of accessory muscles of respiration.  CARDIOVASCULAR: S1, S2 normal. No murmurs, rubs, or gallops.  ABDOMEN: Soft, nontender, nondistended. Bowel sounds present. No organomegaly or mass.  EXTREMITIES: No cyanosis, clubbing or edema b/l.    NEUROLOGIC: Cranial nerves II through XII are intact. No focal Motor or sensory deficits b/l.   PSYCHIATRIC:  patient is alert and oriented x 3.  SKIN: No obvious rash, lesion, or ulcer.   LABORATORY PANEL:  CBC Recent Labs  Lab 09/10/17 0319  WBC 7.1  HGB 13.0  HCT 38.3  PLT 190    Chemistries  Recent Labs  Lab 09/09/17 1405 09/10/17 0319  NA 141 141  K 4.2 3.7  CL 105 106  CO2 27 28  GLUCOSE 115* 114*  BUN 15 16  CREATININE 0.69 0.63  CALCIUM 9.0 8.7*  AST 36  --   ALT 45  --   ALKPHOS 72  --   BILITOT 0.7  --    Cardiac Enzymes No results for input(s): TROPONINI in the last 168 hours. RADIOLOGY:  Dg Chest 1 View  Result Date: 09/09/2017 CLINICAL DATA:  Fall today with right hip and knee pain. EXAM: CHEST  1 VIEW COMPARISON:  07/20/2016 FINDINGS: Sternotomy wires unchanged. Lungs are adequately inflated with postsurgical changes  over the right base, paramediastinal region and hilum. There is no lobar consolidation or effusion. Mild stable cardiomegaly. Calcified plaque over the aortic arch. No evidence of fracture. IMPRESSION: No acute findings. Mild stable cardiomegaly.  Postsurgical change over the right lung. Electronically Signed   By: Marin Olp M.D.   On: 09/09/2017 11:54   Dg Knee Complete 4 Views Right  Result Date: 09/09/2017 CLINICAL  DATA:  Fall today with right hip and knee pain. EXAM: RIGHT KNEE - COMPLETE 4+ VIEW COMPARISON:  01/02/2016 FINDINGS: Diffuse decreased bone density. Right total knee arthroplasty intact and unchanged. No acute fracture or dislocation. No joint effusion. Remainder of the exam is unchanged. IMPRESSION: No acute findings. Electronically Signed   By: Marin Olp M.D.   On: 09/09/2017 11:50   Dg Hip Unilat With Pelvis 2-3 Views Right  Result Date: 09/09/2017 CLINICAL DATA:  Fall today with right hip pain. EXAM: DG HIP (WITH OR WITHOUT PELVIS) 2-3V RIGHT COMPARISON:  None. FINDINGS: Unipolar right hip arthroplasty intact. Mild degenerate change of the left hip and symphysis pubis joint. Focal cortical step-off along the anterior aspect of the trochanteric region of the right hip seen only on the external rotation view which may be due to acute fracture and less likely postsurgical change. Remainder the exam is unremarkable. IMPRESSION: Unipolar right hip arthroplasty intact. Suggestion of fracture along the anterior cortex of the trochanteric region of the right hip seen only on the external rotation view. Electronically Signed   By: Marin Olp M.D.   On: 09/09/2017 11:48   ASSESSMENT AND PLAN:  Jodi Bullock  is a 84 y.o. female with a known history of arthritis, bladder incontinence, breast cancer, chronic systolic CHF, depression, gastroesophageal reflux disease, hyperlipidemia, hypertension, thymus cancer- lives in [facility] with her husband. Her husband is having weakness and cough with low-grade fever for last 2 days and had an episode of stumbling while going to the cafeteria. While trying to help him, she lost her balance and fell down and since then having pain in her right hip.  * right femur fracture s/p mechanical fall   Orthopedic consult with Dr Kathryne Hitch operative fracture. OK to start PT   Pain management and physical therapy evaluation--HHPT  *  hypertension   Continue home medications, currently elevated due to pain.  * hyperlipidemia   Continue home medication.  * chronic systolic CHF   Currently no signs of extubation, continue to monitor.  * D/c planning to [facility] with [facility]    Case discussed with Care Management/Social Worker. Management plans discussed with the patient, family and they are in agreement.  CODE STATUS: Partial  DVT Prophylaxis: lovenox  TOTAL TIME TAKING CARE OF THIS PATIENT: *30* minutes.  >50% time spent on counselling and coordination of care  POSSIBLE D/C IN *1* DAYS, DEPENDING ON CLINICAL CONDITION.  Note: This dictation was prepared with Dragon dictation along with smaller phrase technology. Any transcriptional errors that result from this process are unintentional.  Fritzi Mandes M.D on 09/10/2017 at 3:25 PM  Between 7am to 6pm - Pager - [phone]  After 6pm go to [email or web address] - password [facility]  Office  [phone]  CC: Primary care physician; Tracie Harrier, MD Patient ID: Keyairra Kolinski, female   DOB: 10/18/1924, 84 y.o.   MRN: [id number]

## 2017-09-10 NOTE — Progress Notes (Signed)
Chaplain stopped in during rounding. Pt was upset that she didn't get the meal she ordered. Chaplain assisted in helping to get her meal ordered. Pt informed Chaplain her husband was a Company secretary. Chaplain will stop in to visit husband. Pt was agreeable to follow up from [facility].    09/10/17 1300  Clinical Encounter Type  Visited With Patient  Visit Type Initial  Referral From Chaplain  Spiritual Encounters  Spiritual Needs Emotional

## 2017-09-10 NOTE — Evaluation (Signed)
Physical Therapy Evaluation Patient Details Name: Jodi Bullock MRN: [id number] DOB: 1924-07-03 Today's Date: 09/10/2017   History of Present Illness  Pt is a 84 y.o. female s/p mechanical fall (lost balance attempting to catch husband) injuring R hip and R knee.  Conservative management recommended (non-op) for noted fx along anterior cortex of trochanteric region on R hip.  PMH includes bladder incontinence, breast CA, htn, neuropathy B feet, R humerus head fx 08/09/16, chronic L1 and L3 compression fx's, h/o colles fx/femur fx/radius/ulna fx, CHF.  Clinical Impression  Prior to hospital admission, pt was ambulatory (with SPC vs RW depending on situation).  Pt lives at [facility] facility.  Currently pt is min assist supine to sit; min to mod assist x2 to stand with RW; and min to mod assist x2 to ambulate a few feet bed to recliner with RW.  Pt appearing very anxious during activities (pt initially sitting on edge of bed and appearing to be visibly shaking--pt reporting d/t being scared of falling--but improved with gentle encouragement).  Significant posterior lean noted with standing/ambulation activities requiring assist for balance.  Pt encouraged to off-weight R LE as much as possible (via offloading through UE's on RW) with standing/ambulation activities; pt appearing to have more pain attempting to advance R LE than [place] with use of RW.  Pt reporting minimal R hip pain end of session.  Pt would benefit from skilled PT to address noted impairments and functional limitations (see below for any additional details).  Upon hospital discharge, recommend pt discharge to [place].    Follow Up Recommendations SNF    Equipment Recommendations  Rolling walker with 5" wheels;Wheelchair (measurements PT);Wheelchair cushion (measurements PT)    Recommendations for Other Services OT consult     Precautions / Restrictions Precautions Precautions: Fall Restrictions Weight Bearing  Restrictions: Yes RLE Weight Bearing: Weight bearing as tolerated Other Position/Activity Restrictions: WBAT with walker      Mobility  Bed Mobility Overal bed mobility: Needs Assistance Bed Mobility: Supine to Sit     Supine to sit: Min assist;HOB elevated     General bed mobility comments: assist for R LE; vc's for technique; increased time for pt to perform d/t R hip pain  Transfers Overall transfer level: Needs assistance Equipment used: Rolling walker (2 wheeled) Transfers: Sit to/from Stand Sit to Stand: Min assist;Mod assist;+2 physical assistance         General transfer comment: vc's for UE and LE placement; posterior lean noted requiring physical assist and cueing to shift weight forward once standing  Ambulation/Gait Ambulation/Gait assistance: Min assist;Mod assist;+2 physical assistance Ambulation Distance (Feet): 3 Feet(bed to recliner) Assistive device: Rolling walker (2 wheeled) Gait Pattern/deviations: Step-to pattern Gait velocity: decreased   General Gait Details: pt with difficulty advancing R LE d/t R hip pain; vc's to increase UE support through RW to offweight R LE; vc's and assist for gait technique and walker use  Stairs            Wheelchair Mobility    Modified Rankin (Stroke Patients Only)       Balance Overall balance assessment: Needs assistance Sitting-balance support: Bilateral upper extremity supported;Feet supported Sitting balance-Leahy Scale: Poor Sitting balance - Comments: requires UE support for static sitting balance   Standing balance support: Bilateral upper extremity supported Standing balance-Leahy Scale: Poor Standing balance comment: significant posterior lean standing initially requiring assist to shift weight forward (using RW)  Pertinent Vitals/Pain Pain Assessment: 0-10 Pain Score: 2 (2/10 at rest end of session; 8/10 with mobility) Pain Descriptors / Indicators:  Sore Pain Intervention(s): Limited activity within patient's tolerance;Monitored during session;RN gave pain meds during session;Repositioned  Vitals (HR and O2 on room air) stable and WFL throughout treatment session.    Home Living Family/patient expects to be discharged to:: Other (Comment)                 Additional Comments: [place]    Prior Function Level of Independence: Independent with assistive device(s)         Comments: Pt ambulatory with cane within house and RW outside home.  Walks to dining hall for meals about 3x's/day.  Independent with showers and medications.     Hand Dominance        Extremity/Trunk Assessment   Upper Extremity Assessment Upper Extremity Assessment: (R shoulder flexion AROM to grossly 80 degrees (limited d/t R shoulder pain))    Lower Extremity Assessment Lower Extremity Assessment: RLE deficits/detail;LLE deficits/detail RLE Deficits / Details: at least 2/5 hip flexion; at least 3-/5 knee flexion/extension; at least 3/5 DF LLE Deficits / Details: at least 3+/5 hip flexion, knee flexion/extension, and DF    Cervical / Trunk Assessment Cervical / Trunk Assessment: Normal  Communication   Communication: HOH  Cognition Arousal/Alertness: Awake/alert Behavior During Therapy: Anxious Overall Cognitive Status: Within Functional Limits for tasks assessed                                        General Comments General comments (skin integrity, edema, etc.): Purewick removed for sessions activities (nursing tech notified end of session to replace).  Nursing cleared pt for participation in physical therapy  (nursing talked with MD Sabra Heck from orthopedics this morning who gave verbal WBAT; per 4/28 ER Dr. Corky Downs note pt is WB with a walker).  Pt agreeable to PT session.     Exercises     Assessment/Plan    PT Assessment Patient needs continued PT services  PT Problem List Decreased  strength;Decreased activity tolerance;Decreased balance;Decreased mobility;Decreased knowledge of use of DME;Decreased knowledge of precautions;Pain       PT Treatment Interventions DME instruction;Gait training;Functional mobility training;Therapeutic activities;Therapeutic exercise;Balance training;Patient/family education    PT Goals (Current goals can be found in the Care Plan section)  Acute Rehab PT Goals Patient Stated Goal: to be able to walk again PT Goal Formulation: With patient Time For Goal Achievement: 09/24/17 Potential to Achieve Goals: Fair    Frequency 7X/week   Barriers to discharge Decreased caregiver support      Co-evaluation               AM-PAC PT "6 Clicks" Daily Activity  Outcome Measure Difficulty turning over in bed (including adjusting bedclothes, sheets and blankets)?: Unable Difficulty moving from lying on back to sitting on the side of the bed? : Unable Difficulty sitting down on and standing up from a chair with arms (e.g., wheelchair, bedside commode, etc,.)?: Unable Help needed moving to and from a bed to chair (including a wheelchair)?: Total Help needed walking in hospital room?: Total Help needed climbing 3-5 steps with a railing? : Total 6 Click Score: 6    End of Session Equipment Utilized During Treatment: Gait belt Activity Tolerance: Patient limited by pain Patient left: in chair;with call bell/phone within reach;with chair alarm  set;Other (comment)(B heels elevated via pillow) Nurse Communication: Mobility status;Precautions;Weight bearing status PT Visit Diagnosis: Other abnormalities of gait and mobility (R26.89);Muscle weakness (generalized) (M62.81);History of falling (Z91.81);Difficulty in walking, not elsewhere classified (R26.2);Pain Pain - Right/Left: Right Pain - part of body: Hip    Time: 1457-1535 PT Time Calculation (min) (ACUTE ONLY): 38 min   Charges:   PT Evaluation $PT Eval Low Complexity: 1 Low PT  Treatments $Therapeutic Activity: 8-22 mins   PT G CodesLeitha Bleak, PT 09/10/17, 4:28 PM [phone]

## 2017-09-10 NOTE — Care Management Note (Addendum)
Case Management Note  Patient Details  Name: Jodi Bullock MRN: [id number] Date of Birth: August 07, 1924  Subjective/Objective:     Met with patient at bedside to discuss [facility].  She understands she will have to go home at discharge and is interested in home health. Patient would like to use [facility]. Referral to cheryl for HHPT with [facility] . Patient uses a cane and a walker. RNCM will follow for discharge planning.              Action/Plan: [facility] for HHPT. RNCM to follow to discharge planning.    Expected Discharge Date:  09/11/17               Expected Discharge Plan:  [place]  In-House Referral:     Discharge planning Services  CM Consult  Post Acute Care Choice:  Home Health Choice offered to:  Patient  DME Arranged:    DME Agency:     HH Arranged:  PT HH Agency:  [facility]  Status of Service:  In process, will continue to follow  If discussed at [facility] Length of Stay Meetings, dates discussed:    Additional Comments:  Jolly Mango, RN 09/10/2017, 9:18 AM

## 2017-09-10 NOTE — Care Management Obs Status (Signed)
[facility] NOTIFICATION   Patient Details  Name: Jodi Bullock MRN: [id number] Date of Birth: [birth year]   Medicare Observation Status Notification Given:  Yes    Jolly Mango, RN 09/10/2017, 9:17 AM

## 2017-09-10 NOTE — Progress Notes (Signed)
Pt has been coughing frequently this afternoon, sometimes it is productive with clear or tan sputum. Pt states she has "chronic bronchitis" and takes tessalon pearls at home. Her BP is also elevated this afternoon and received losartan this morning. Dr. Posey Pronto notified, received order for tessalon pearls and PRN IV hydralazine.   [place], Jerry Caras

## 2017-09-10 NOTE — Consult Note (Signed)
ORTHOPAEDIC CONSULTATION  REQUESTING PHYSICIAN: Fritzi Mandes, MD  Chief Complaint: Right hip pain  HPI: Jodi Bullock is a 84 y.o. female who complains of right hip pain after a fall yesterday at home.  She was helping her husband who falling and they both fell.  Exam and x-ray in the emergency room revealed a nondisplaced fracture of the greater trochanter of the right femur around a total hip replacement.  There is no displacement.  Patient was unable to ambulate and was admitted to observation for care.  Past Medical History:  Diagnosis Date  . Arthritis   . Bladder incontinence   . Breast cancer ([place]) 1998  . Chronic systolic CHF (congestive heart failure) ([place]) 01/10/2016   a. 09/2015 Echo: EF 40-45%, possible inf HK, mod PAH (PASP 63mmHg).  . Depression   . GERD (gastroesophageal reflux disease)   . History of kidney stones   . HLD (hyperlipidemia)   . Hypertension   . Neuropathy of both feet   . NICM (nonischemic cardiomyopathy) ([place])    a. 08/2015 Lexiscan MV: EF 39%, no ischemia; b. 09/2015 Echo: EF 40-45%.  . Right Humerus head fracture    a. 07/2016 - managed conservatively.  . 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 ABDOMINAL HYSTERECTOMY    . BREAST LUMPECTOMY    . CATARACT EXTRACTION    . CHOLECYSTECTOMY    . JOINT REPLACEMENT Right    Hip  . KNEE SURGERY Right    Social History   Socioeconomic History  . Marital status: Married    Spouse name: Not on file  . Number of children: Not on file  . Years of education: Not on file  . Highest education level: Not on file  Occupational History  . Not on file  Social Needs  . Financial resource strain: Not on file  . Food insecurity:    Worry: Not on file    Inability: Not on file  . Transportation needs:    Medical: Not on file    Non-medical: Not on file  Tobacco Use  . Smoking status: Former Research scientist (life sciences)  . Smokeless tobacco: Never Used  . Tobacco comment: smoked  in high school for a few years only  Substance and Sexual Activity  . Alcohol use: No    Alcohol/week: 0.0 oz  . Drug use: No  . Sexual activity: Not Currently  Lifestyle  . Physical activity:    Days per week: Not on file    Minutes per session: Not on file  . Stress: Not on file  Relationships  . Social connections:    Talks on phone: Not on file    Gets together: Not on file    Attends religious service: Not on file    Active member of club or organization: Not on file    Attends meetings of clubs or organizations: Not on file    Relationship status: Not on file  Other Topics Concern  . Not on file  Social History Narrative  . Not on file   Family History  Problem Relation Age of Onset  . Stroke Mother   . Diabetes Father   . Heart disease Father   . Kidney disease Neg Hx   . Bladder Cancer Neg Hx   . Kidney cancer Neg Hx    Allergies  Allergen Reactions  . 2,4-D Dimethylamine (Amisol) Other (See Comments)    Other Reaction: OTHER REACTION-IRRITATION TO Esophagous  .  Celebrex [Celecoxib] Other (See Comments)     esophagitis Esophageal spasms    . Ciprofloxacin Other (See Comments)    Esophageal irritation  . Ibuprofen     Other reaction(s): Other (See Comments) reflux  . Metronidazole Other (See Comments)    neuropathy  . Naproxen Other (See Comments)    GI UPSET  . Other Other (See Comments)    Other Reaction: esophagitis   Prior to Admission medications   Medication Sig Start Date End Date Taking? Authorizing Provider  benzonatate (TESSALON) 200 MG capsule Take 1 capsule (200 mg total) by mouth 3 (three) times daily as needed for cough. 09/07/17  Yes Laverle Hobby, MD  Cholecalciferol 2000 UNITS TABS Take 1 tablet by mouth daily.   Yes Keith Rake Asad A, MD  fluticasone (FLONASE) 50 MCG/ACT nasal spray Place 1 spray into both nostrils daily. 08/30/17  Yes Wilhelmina Mcardle, MD  Fluticasone Furoate (ARNUITY ELLIPTA) 100 MCG/ACT AEPB Inhale 1 puff into  the lungs daily. 08/30/17  Yes Wilhelmina Mcardle, MD  ipratropium (ATROVENT) 0.06 % nasal spray Place 2 sprays into the nose 4 (four) times daily. 07/04/17 09/09/17 Yes Laverle Hobby, MD  loratadine (CLARITIN) 10 MG tablet Take 1 tablet (10 mg total) by mouth at bedtime. 08/30/17 08/30/18 Yes Wilhelmina Mcardle, MD  LORazepam (ATIVAN) 0.5 MG tablet Take 1 tablet (0.5 mg total) by mouth 2 (two) times daily as needed for anxiety. 05/23/17  Yes Roselee Nova, MD  losartan (COZAAR) 100 MG tablet Take 1 tablet (100 mg total) by mouth daily. 05/23/17  Yes Roselee Nova, MD  lovastatin (MEVACOR) 40 MG tablet Take 1 tablet (40 mg total) by mouth daily. Patient taking differently: Take 40 mg by mouth daily. Pt takes 3 times weekly 05/23/17  Yes Keith Rake Asad A, MD  metoprolol tartrate (LOPRESSOR) 50 MG tablet TAKE 1 TABLET(50 MG) BY MOUTH TWICE DAILY 04/09/17  Yes Roselee Nova, MD  Multiple Vitamins-Minerals (PRESERVISION/LUTEIN) CAPS Take 1 each by mouth 2 (two) times daily.   Yes [provider]  polyethylene glycol powder (GLYCOLAX/MIRALAX) powder 17 grams a day 10/22/09   [provider]   Dg Chest 1 View  Result Date: 09/09/2017 CLINICAL DATA:  Fall today with right hip and knee pain. EXAM: CHEST  1 VIEW COMPARISON:  07/20/2016 FINDINGS: Sternotomy wires unchanged. Lungs are adequately inflated with postsurgical changes over the right base, paramediastinal region and hilum. There is no lobar consolidation or effusion. Mild stable cardiomegaly. Calcified plaque over the aortic arch. No evidence of fracture. IMPRESSION: No acute findings. Mild stable cardiomegaly.  Postsurgical change over the right lung. Electronically Signed   By: Marin Olp M.D.   On: 09/09/2017 11:54   Dg Knee Complete 4 Views Right  Result Date: 09/09/2017 CLINICAL DATA:  Fall today with right hip and knee pain. EXAM: RIGHT KNEE - COMPLETE 4+ VIEW COMPARISON:  01/02/2016 FINDINGS: Diffuse decreased bone  density. Right total knee arthroplasty intact and unchanged. No acute fracture or dislocation. No joint effusion. Remainder of the exam is unchanged. IMPRESSION: No acute findings. Electronically Signed   By: Marin Olp M.D.   On: 09/09/2017 11:50   Dg Hip Unilat With Pelvis 2-3 Views Right  Result Date: 09/09/2017 CLINICAL DATA:  Fall today with right hip pain. EXAM: DG HIP (WITH OR WITHOUT PELVIS) 2-3V RIGHT COMPARISON:  None. FINDINGS: Unipolar right hip arthroplasty intact. Mild degenerate change of the left hip and symphysis pubis joint.  Focal cortical step-off along the anterior aspect of the trochanteric region of the right hip seen only on the external rotation view which may be due to acute fracture and less likely postsurgical change. Remainder the exam is unremarkable. IMPRESSION: Unipolar right hip arthroplasty intact. Suggestion of fracture along the anterior cortex of the trochanteric region of the right hip seen only on the external rotation view. Electronically Signed   By: Marin Olp M.D.   On: 09/09/2017 11:48    Positive ROS: All other systems have been reviewed and were otherwise negative with the exception of those mentioned in the HPI and as above.  Physical Exam: General: Alert, no acute distress Cardiovascular: No pedal edema Respiratory: No cyanosis, no use of accessory musculature GI: No organomegaly, abdomen is soft and non-tender Skin: No lesions in the area of chief complaint Neurologic: Sensation intact distally Psychiatric: Patient is competent for consent with normal mood and affect Lymphatic: No axillary or cervical lymphadenopathy  MUSCULOSKELETAL: Patient alert and cooperative.  There is tenderness over the right hip.  Range of motion is satisfactory.  She has had total hip and knee.  Neurovascular status good distally.  Assessment: Nondisplaced fracture greater trochanter right femur   Plan:  Conservative treatment with PT and walker.  Weightbearing  as tolerated.  She would like to follow-up with Dr. Hessie Knows.  Short-term placement may be necessary as her husband is also hospitalized.    Park Breed, MD [phone]   09/10/2017 4:50 PM

## 2017-09-10 NOTE — Clinical Social Work Note (Signed)
Clinical Social Work Assessment  Patient Details  Name: Jodi Bullock MRN: [id number] Date of Birth: December 23, 1924  Date of referral:  09/10/17               Reason for consult:  Family Concerns, Discharge Planning                Permission sought to share information with:    Permission granted to share information::     Name::        Agency::     Relationship::     Contact Information:     Housing/Transportation Living arrangements for the past 2 months:  [place] of Information:  Patient, Adult Children, Spouse Patient Interpreter Needed:  None Criminal Activity/Legal Involvement Pertinent to Current Situation/Hospitalization:  No - Comment as needed Significant Relationships:  Adult Children, Spouse Lives with:  Spouse Do you feel safe going back to the place where you live?  Yes Need for family participation in patient care:  Yes (Comment)  Care giving concerns:  Patient lives at [facility] independent living in [place] with her husband Jodi Bullock.    Social Worker assessment / plan:  Holiday representative (CSW) received consult from RN that patient's family would like to speak to CSW about D/C plan. Patient's husband Jodi is also in the hospital. CSW met with patient and made her aware that patient's husband is going to SNF for rehab because he is under inpatient status. CSW explained to patient that she is under observation, which means that medicare will not pay for SNF. CSW also met with patient's adult children Jodi Bullock and Jodi Bullock and made them aware of above. Adult children stated that patient can't private pay for SNF and will have to return to [facility] with home health. Patient is aware of above and in agreement with plan. RN case manager aware of above.    Employment status:  Disabled (Comment on whether or not currently receiving Disability) Insurance information:  Medicare PT Recommendations:  [place] / Referral  to community resources:  Other (Comment Required)(Patient is under medicare observation )  Patient/Family's Response to care:  Patient is in agreement to D/C home.   Patient/Family's Understanding of and Emotional Response to Diagnosis, Current Treatment, and Prognosis:  Patient and her family were very pleasant and thanked CSW for assistance.   Emotional Assessment Appearance:  Appears stated age Attitude/Demeanor/Rapport:    Affect (typically observed):  Accepting, Quiet Orientation:  Oriented to Self, Oriented to Place, Oriented to  Time, Oriented to Situation Alcohol / Substance use:  Not Applicable Psych involvement (Current and /or in the community):  No (Comment)  Discharge Needs  Concerns to be addressed:  Discharge Planning Concerns Readmission within the last 30 days:  No Current discharge risk:  Dependent with Mobility Barriers to Discharge:  Continued Medical Work up   [employer], Veronia Beets, LCSW 09/10/2017, 3:41 PM

## 2017-09-10 NOTE — Care Management (Signed)
Attempted to reach son and daughter by phone with out success.

## 2017-09-11 ENCOUNTER — Inpatient Hospital Stay: Payer: Medicare Other

## 2017-09-11 DIAGNOSIS — Z87442 Personal history of urinary calculi: Secondary | ICD-10-CM | POA: Diagnosis not present

## 2017-09-11 DIAGNOSIS — Z853 Personal history of malignant neoplasm of breast: Secondary | ICD-10-CM | POA: Diagnosis not present

## 2017-09-11 DIAGNOSIS — Z7951 Long term (current) use of inhaled steroids: Secondary | ICD-10-CM | POA: Diagnosis not present

## 2017-09-11 DIAGNOSIS — Z888 Allergy status to other drugs, medicaments and biological substances status: Secondary | ICD-10-CM | POA: Diagnosis not present

## 2017-09-11 DIAGNOSIS — Z96641 Presence of right artificial hip joint: Secondary | ICD-10-CM | POA: Diagnosis present

## 2017-09-11 DIAGNOSIS — Z8249 Family history of ischemic heart disease and other diseases of the circulatory system: Secondary | ICD-10-CM | POA: Diagnosis not present

## 2017-09-11 DIAGNOSIS — G92 Toxic encephalopathy: Secondary | ICD-10-CM | POA: Diagnosis present

## 2017-09-11 DIAGNOSIS — E785 Hyperlipidemia, unspecified: Secondary | ICD-10-CM | POA: Diagnosis present

## 2017-09-11 DIAGNOSIS — I110 Hypertensive heart disease with heart failure: Secondary | ICD-10-CM | POA: Diagnosis present

## 2017-09-11 DIAGNOSIS — Z85238 Personal history of other malignant neoplasm of thymus: Secondary | ICD-10-CM | POA: Diagnosis not present

## 2017-09-11 DIAGNOSIS — Z90710 Acquired absence of both cervix and uterus: Secondary | ICD-10-CM | POA: Diagnosis not present

## 2017-09-11 DIAGNOSIS — M1990 Unspecified osteoarthritis, unspecified site: Secondary | ICD-10-CM | POA: Diagnosis present

## 2017-09-11 DIAGNOSIS — I5022 Chronic systolic (congestive) heart failure: Secondary | ICD-10-CM | POA: Diagnosis present

## 2017-09-11 DIAGNOSIS — K219 Gastro-esophageal reflux disease without esophagitis: Secondary | ICD-10-CM | POA: Diagnosis present

## 2017-09-11 DIAGNOSIS — T50995A Adverse effect of other drugs, medicaments and biological substances, initial encounter: Secondary | ICD-10-CM | POA: Diagnosis present

## 2017-09-11 DIAGNOSIS — Z79899 Other long term (current) drug therapy: Secondary | ICD-10-CM | POA: Diagnosis not present

## 2017-09-11 DIAGNOSIS — W010XXA Fall on same level from slipping, tripping and stumbling without subsequent striking against object, initial encounter: Secondary | ICD-10-CM | POA: Diagnosis present

## 2017-09-11 DIAGNOSIS — S72114A Nondisplaced fracture of greater trochanter of right femur, initial encounter for closed fracture: Secondary | ICD-10-CM | POA: Diagnosis present

## 2017-09-11 DIAGNOSIS — S728X1A Other fracture of right femur, initial encounter for closed fracture: Secondary | ICD-10-CM | POA: Diagnosis present

## 2017-09-11 DIAGNOSIS — G629 Polyneuropathy, unspecified: Secondary | ICD-10-CM | POA: Diagnosis present

## 2017-09-11 DIAGNOSIS — Z87891 Personal history of nicotine dependence: Secondary | ICD-10-CM | POA: Diagnosis not present

## 2017-09-11 DIAGNOSIS — Y92098 Other place in other non-institutional residence as the place of occurrence of the external cause: Secondary | ICD-10-CM | POA: Diagnosis not present

## 2017-09-11 DIAGNOSIS — Z886 Allergy status to analgesic agent status: Secondary | ICD-10-CM | POA: Diagnosis not present

## 2017-09-11 DIAGNOSIS — Z881 Allergy status to other antibiotic agents status: Secondary | ICD-10-CM | POA: Diagnosis not present

## 2017-09-11 LAB — URINALYSIS, COMPLETE (UACMP) WITH MICROSCOPIC
Bilirubin Urine: NEGATIVE
Glucose, UA: NEGATIVE mg/dL
Hgb urine dipstick: NEGATIVE
KETONES UR: 5 mg/dL — AB
Leukocytes, UA: NEGATIVE
Nitrite: NEGATIVE
PROTEIN: NEGATIVE mg/dL
Specific Gravity, Urine: 1.013 (ref 1.005–1.030)
WBC UA: NONE SEEN WBC/hpf (ref 0–5)
pH: 5 (ref 5.0–8.0)

## 2017-09-11 MED ORDER — TRAMADOL HCL 50 MG PO TABS: 50.0000 mg | ORAL_TABLET | Freq: Four times a day (QID) | ORAL | 0 refills | Status: DC | PRN

## 2017-09-11 MED ORDER — TRAZODONE HCL 50 MG PO TABS
25.0000 mg | ORAL_TABLET | Freq: Every day | ORAL | Status: DC
  Administered 2017-09-11 – 2017-09-13 (×3): 25 mg via ORAL
  Filled 2017-09-11 (×3): qty 1

## 2017-09-11 MED ORDER — ACETAMINOPHEN 325 MG PO TABS
650.0000 mg | ORAL_TABLET | Freq: Four times a day (QID) | ORAL | Status: DC | PRN
  Administered 2017-09-11 – 2017-09-13 (×6): 650 mg via ORAL
  Filled 2017-09-11 (×6): qty 2

## 2017-09-11 NOTE — Progress Notes (Addendum)
Patient ID: Jodi Bullock, female   DOB: [birth year], 84 y.o.   MRN: [id number]  called by Case manager and RN that pt seen by PT and too weak to go back to her home. She was confused per RN however during my visit she appeared ok , answered most questions appropriately. She was a bit anxious about her husband and him going to rehab.  Dr Rudene Christians stopped by to see pt and feels a change in her. I will d/c tramadol, NO benzodiazepines.  Tylenol prn CT head now And check [facility] CM to look into d/c planning options

## 2017-09-11 NOTE — Progress Notes (Signed)
Patient ID: Jodi Bullock, female   DOB: 07/03/1924, 84 y.o.   MRN: [id number] CT head ordered await results.  Will try and call son. 4 pm vitals looks ok. Neurology consult if needed

## 2017-09-11 NOTE — Discharge Summary (Signed)
[place] [facility] [place] NAME: Jodi Bullock    MR#:  [id number]  DATE OF BIRTH:  April 25, 1925  DATE OF ADMISSION:  09/09/2017 ADMITTING PHYSICIAN: Jodi Basta, MD  DATE OF DISCHARGE: 09/11/2017  PRIMARY CARE PHYSICIAN: Jodi Harrier, MD    ADMISSION DIAGNOSIS:  Other closed fracture of right femur, unspecified portion of femur, initial encounter ([place]) [S72.8X1A]  DISCHARGE DIAGNOSIS:  Closed right femur fracture--NON operative management  SECONDARY DIAGNOSIS:   Past Medical History:  Diagnosis Date  . Arthritis   . Bladder incontinence   . Breast cancer ([place]) 1998  . Chronic systolic CHF (congestive heart failure) ([place]) 01/10/2016   a. 09/2015 Echo: EF 40-45%, possible inf HK, mod PAH (PASP 20mmHg).  . Depression   . GERD (gastroesophageal reflux disease)   . History of kidney stones   . HLD (hyperlipidemia)   . Hypertension   . Neuropathy of both feet   . NICM (nonischemic cardiomyopathy) ([place])    a. 08/2015 Lexiscan MV: EF 39%, no ischemia; b. 09/2015 Echo: EF 40-45%.  . Right Humerus head fracture    a. 07/2016 - managed conservatively.  . 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 Her husband is having weakness and cough with low-grade fever for last 2 days and had an episode of stumbling while going to the cafeteria. While trying to help him, she lost her balance and fell down and since then having pain in her right hip.  * right femur fracture s/p mechanical fall Orthopedic consult with Dr Kathryne Hitch operative fracture. OK to start PT Pain management and physical therapy evaluation--HHPT  * hypertension Continue  home medications, currently elevated due to pain.  * hyperlipidemia Continue home medication.  * chronic systolic CHF Currently no signs of extubation, continue to monitor.  * D/c planning to [facility] with [facility] PT    CONSULTS OBTAINED:  Treatment Team:  Earnestine Leys, MD  DRUG ALLERGIES:   Allergies  Allergen Reactions  . 2,4-D Dimethylamine (Amisol) Other (See Comments)    Other Reaction: OTHER REACTION-IRRITATION TO Esophagous  . Celebrex [Celecoxib] Other (See Comments)     esophagitis Esophageal spasms    . Ciprofloxacin Other (See Comments)    Esophageal irritation  . Ibuprofen     Other reaction(s): Other (See Comments) reflux  . Metronidazole Other (See Comments)    neuropathy  . Naproxen Other (See Comments)    GI UPSET  . Other Other (See Comments)    Other Reaction: esophagitis    DISCHARGE MEDICATIONS:   Allergies as of 09/11/2017      Reactions   2,4-d Dimethylamine (amisol) Other (See Comments)   Other Reaction: OTHER REACTION-IRRITATION TO Esophagous   Celebrex [celecoxib] Other (See Comments)    esophagitis Esophageal spasms     Ciprofloxacin Other (See Comments)   Esophageal irritation   Ibuprofen    Other reaction(s): Other (See Comments) reflux   Metronidazole Other (See Comments)   neuropathy   Naproxen Other (See Comments)   GI UPSET   Other Other (See Comments)   Other Reaction: esophagitis      Medication List    TAKE these medications   benzonatate 200 MG capsule Commonly known as:  TESSALON Take 1 capsule (200 mg  total) by mouth 3 (three) times daily as needed for cough.   Cholecalciferol 2000 units Tabs Take 1 tablet by mouth daily.   fluticasone 50 MCG/ACT nasal spray Commonly known as:  FLONASE Place 1 spray into both nostrils daily.   Fluticasone Furoate 100 MCG/ACT Aepb Commonly known as:  ARNUITY ELLIPTA Inhale 1 puff into the lungs daily.   ipratropium 0.06 % nasal spray Commonly known as:   ATROVENT Place 2 sprays into the nose 4 (four) times daily.   loratadine 10 MG tablet Commonly known as:  CLARITIN Take 1 tablet (10 mg total) by mouth at bedtime.   LORazepam 0.5 MG tablet Commonly known as:  ATIVAN Take 1 tablet (0.5 mg total) by mouth 2 (two) times daily as needed for anxiety.   losartan 100 MG tablet Commonly known as:  COZAAR Take 1 tablet (100 mg total) by mouth daily.   lovastatin 40 MG tablet Commonly known as:  MEVACOR Take 1 tablet (40 mg total) by mouth daily. What changed:  additional instructions   metoprolol tartrate 50 MG tablet Commonly known as:  LOPRESSOR TAKE 1 TABLET(50 MG) BY MOUTH TWICE DAILY   polyethylene glycol powder powder Commonly known as:  GLYCOLAX/MIRALAX 17 grams a day   PRESERVISION/LUTEIN Caps Take 1 each by mouth 2 (two) times daily.   traMADol 50 MG tablet Commonly known as:  ULTRAM Take 1 tablet (50 mg total) by mouth every 6 (six) hours as needed for moderate pain or severe pain.       If you experience worsening of your admission symptoms, develop shortness of breath, life threatening emergency, suicidal or homicidal thoughts you must seek medical attention immediately by calling 911 or calling your MD immediately  if symptoms less severe.  You Must read complete instructions/literature along with all the possible adverse reactions/side effects for all the Medicines you take and that have been prescribed to you. Take any new Medicines after you have completely understood and accept all the possible adverse reactions/side effects.   Please note  You were cared for by a hospitalist during your hospital stay. If you have any questions about your discharge medications or the care you received while you were in the hospital after you are discharged, you can call the unit and asked to speak with the hospitalist on call if the hospitalist that took care of you is not available. Once you are discharged, your primary care  physician will handle any further medical issues. Please note that NO REFILLS for any discharge medications will be authorized once you are discharged, as it is imperative that you return to your primary care physician (or establish a relationship with a primary care physician if you do not have one) for your aftercare needs so that they can reassess your need for medications and monitor your lab values. Today   SUBJECTIVE   Pt a bit upset about going back to her facility  VITAL SIGNS:  Blood pressure (!) 154/60, pulse 81, temperature 99 F (37.2 C), temperature source Oral, resp. rate 18, height 5\' 2"  (1.575 m), weight 71.7 kg (158 lb), SpO2 94 %.  I/O:    Intake/Output Summary (Last 24 hours) at 09/11/2017 1247 Last data filed at 09/10/2017 2127 Gross per 24 hour  Intake 480 ml  Output -  Net 480 ml    PHYSICAL EXAMINATION:  GENERAL:  83 y.o.-year-old patient lying in the bed with no acute distress.  EYES: Pupils equal, round, reactive to light and accommodation. No  scleral icterus. Extraocular muscles intact.  HEENT: Head atraumatic, normocephalic. Oropharynx and nasopharynx clear.  NECK:  Supple, no jugular venous distention. No thyroid enlargement, no tenderness.  LUNGS: Normal breath sounds bilaterally, no wheezing, rales,rhonchi or crepitation. No use of accessory muscles of respiration.  CARDIOVASCULAR: S1, S2 normal. No murmurs, rubs, or gallops.  ABDOMEN: Soft, non-tender, non-distended. Bowel sounds present. No organomegaly or mass.  EXTREMITIES: No pedal edema, cyanosis, or clubbing.  NEUROLOGIC: Cranial nerves II through XII are intact. Muscle strength 5/5 in all extremities. Sensation intact. Gait not checked.  PSYCHIATRIC: The patient is alert and oriented x 3.  SKIN: No obvious rash, lesion, or ulcer.   DATA REVIEW:   CBC  Recent Labs  Lab 09/10/17 0319  WBC 7.1  HGB 13.0  HCT 38.3  PLT 190    Chemistries  Recent Labs  Lab 09/09/17 1405 09/10/17 0319   NA 141 141  K 4.2 3.7  CL 105 106  CO2 27 28  GLUCOSE 115* 114*  BUN 15 16  CREATININE 0.69 0.63  CALCIUM 9.0 8.7*  AST 36  --   ALT 45  --   ALKPHOS 72  --   BILITOT 0.7  --     Microbiology Results   Recent Results (from the past 240 hour(s))  MRSA PCR Screening     Status: None   Collection Time: 09/09/17  5:44 PM  Result Value Ref Range Status   MRSA by PCR NEGATIVE NEGATIVE Final    Comment:        The GeneXpert MRSA Assay (FDA approved for NASAL specimens only), is one component of a comprehensive MRSA colonization surveillance program. It is not intended to diagnose MRSA infection nor to guide or monitor treatment for MRSA infections. Performed at [facility], [address] [place], [place] [address]     RADIOLOGY:  No results found.   Management plans discussed with the patient, family and they are in agreement.  CODE STATUS:     Code Status Orders  (From admission, onward)        Start     Ordered   09/09/17 1700  Limited resuscitation (code)  Continuous    Question Answer Comment  In the event of cardiac or respiratory ARREST: Initiate Code Blue, Call Rapid Response Yes   In the event of cardiac or respiratory ARREST: Perform CPR Yes   In the event of cardiac or respiratory ARREST: Perform Intubation/Mechanical Ventilation No   In the event of cardiac or respiratory ARREST: Use NIPPV/BiPAp only if indicated Yes   In the event of cardiac or respiratory ARREST: Administer ACLS medications if indicated Yes   In the event of cardiac or respiratory ARREST: Perform Defibrillation or Cardioversion if indicated Yes      09/09/17 1659    Code Status History    This patient has a current code status but no historical code status.    Advance Directive Documentation     Most Recent Value  Type of Advance Directive  Living will  Pre-existing out of facility DNR order (yellow form or pink MOST form)  -  "MOST" Form in Place?  -       TOTAL TIME TAKING CARE OF THIS PATIENT: **40* minutes.    Fritzi Mandes M.D on 09/11/2017 at 12:47 PM  Between 7am to 6pm - Pager - [phone] After 6pm go to [email or web address] - password [email or web address] [facility]  Office  [phone]  CC: Primary care  physician; Jodi Harrier, MD

## 2017-09-11 NOTE — Evaluation (Addendum)
Occupational Therapy Evaluation Patient Details Name: Jodi Bullock MRN: [id number] DOB: 1925/02/08 Today's Date: 09/11/2017    History of Present Illness Pt is a 84 y.o. female s/p injured her R hip and R knee sustained during a fall when trying to catch her husband as he fell. Conservative management recommended (non-op) for a fracture of the anterior cortex of trochanteric region onn the right hip.  PMH includes bladder incontinence, breast CA, htn, neuropathy B feet, R humerus head fx 08/09/16, chronic L1 and L3 compression fx's, h/o colles fx/femur fx/radius/ulna fx, CHF.   Clinical Impression   Pt. presents with weakness, limited activity tolerance, and limited functional mobility which limits the ability to complete basic ADL and IADL functioning. Pt. resides at [facility] with her husband. Pt. was independent with ADLs, and IADL functioning including: showering, and medication management. Pt.'s husband was also injured in the fall. Pt. required MaxA for depends management, and care. Pt. Could benefit from OT services for ADL training, A/E training, and pt. education about DME. Pt. Could benefit from SNF level of care, with follow-up OT services upon discharge.    Follow Up Recommendations  SNF    Equipment Recommendations       Recommendations for Other Services       Precautions / Restrictions Precautions Precautions: Fall Restrictions Weight Bearing Restrictions: Yes RLE Weight Bearing: Weight bearing as tolerated      Mobility Bed Mobility      Max A Rolling              Transfers    Deferred                  Balance                                           ADL either performed or assessed with clinical judgement   ADL Overall ADL's : Needs assistance/impaired Eating/Feeding: Set up;Independent   Grooming: Set up;Independent   Upper Body Bathing: Set up;Minimal assistance   Lower Body Bathing:  Set up;Maximal assistance   Upper Body Dressing : Set up;Minimal assistance   Lower Body Dressing: Set up;Maximal assistance       Toileting- Clothing Manipulation and Hygiene: Maximal assistance;Bed level Toileting - Clothing Manipulation Details (indicate cue type and reason): For depends management             Vision Baseline Vision/History: Wears glasses;Macular Degeneration Patient Visual Report: No change from baseline       Perception     Praxis      Pertinent Vitals/Pain Pain Assessment: 0-10 Pain Score: 5  Pain Descriptors / Indicators: Sore     Hand Dominance Right   Extremity/Trunk Assessment Upper Extremity Assessment Upper Extremity Assessment: Generalized weakness(Right shoulder limitations from previous injuries.)           Communication Communication Communication: HOH(Hearing aid to the left ear.)   Cognition Arousal/Alertness: Awake/alert Behavior During Therapy: Anxious Overall Cognitive Status: Within Functional Limits for tasks assessed                                     General Comments       Exercises     Shoulder Instructions      Home Living Family/patient expects to be discharged to::  Other (Comment)                                 Additional Comments: [place]      Prior Functioning/Environment Level of Independence: Independent with assistive device(s)        Comments: Pt. was indepentdent with ADLs, and IADLs at [facility]. Pt. walked to the commonm dining area for meals, and was independent with madication management, and showers.        OT Problem List: Decreased strength;Decreased activity tolerance;Decreased range of motion;Pain      OT Treatment/Interventions: Therapeutic exercise;Self-care/ADL training;DME and/or AE instruction;Patient/family education;Therapeutic activities    OT Goals(Current goals can be found in the care plan section)  Acute Rehab OT Goals Patient Stated Goal: To be independent OT Goal Formulation: With patient Potential to Achieve Goals: Good ADL Goals Pt Will Perform Lower Body Dressing: with modified independence Pt Will Transfer to Toilet: with modified independence  OT Frequency: Min 1X/week   Barriers to D/C:            Co-evaluation              AM-PAC PT "6 Clicks" Daily Activity     Outcome Measure Help from another person eating meals?: None Help from another person taking care of personal grooming?: None Help from another person toileting, which includes using toliet, bedpan, or urinal?: A Lot Help from another person bathing (including washing, rinsing, drying)?: A Lot Help from another person to put on and taking off regular upper body clothing?: A Little Help from another person to put on and taking off regular lower body clothing?: A Lot 6 Click Score: 17   End of Session    Activity Tolerance: Patient limited by pain Patient left: in bed;with bed alarm set  OT Visit Diagnosis: Unsteadiness on feet (R26.81);Muscle weakness (generalized) (M62.81);Pain Pain - Right/Left: Right Pain - part of body: Hip                Time: 9798-9211 OT Time Calculation (min): 23 min Charges:  OT General Charges $OT Visit: 1 Visit OT Evaluation $OT Eval Moderate Complexity: 1 Mod G-Codes:     Harrel Carina, MS, OTR/L   Harrel Carina, MS, OTR/L 09/11/2017, 4:24 PM

## 2017-09-11 NOTE — NC FL2 (Signed)
[place] LEVEL OF CARE SCREENING TOOL     IDENTIFICATION  Patient Name: Jodi Bullock Birthdate: June 01, 1924 Sex: female Admission Date (Current Location): 09/09/2017  [place] and [place] Number:  Engineering geologist and Address:  [facility], [address] [place], [place] [address]      Provider Number: [id number]  Attending Physician Name and Address:  Fritzi Mandes, MD  Relative Name and Phone Number:       Current Level of Care: Hospital Recommended Level of Care: [place] Prior Approval Number:    Date Approved/Denied:   PASRR Number: [id number])  Discharge Plan: SNF    Current Diagnoses: Patient Active Problem List   Diagnosis Date Noted  . Femur fracture ([place]) 09/09/2017  . Abnormal gait 12/19/2016  . Acquired spondylolisthesis 12/19/2016  . Breast lump 12/19/2016  . Cervical spondylosis without myelopathy 12/19/2016  . Closed Colles' fracture 12/19/2016  . Closed fracture of base of neck of femur ([place]) 12/19/2016  . Closed fracture of lower end of radius and ulna 12/19/2016  . Closed fracture of neck of femur ([place]) 12/19/2016  . Spondylolisthesis, congenital 12/19/2016  . Contracture of joint of hand 12/19/2016  . Contracture of wrist joint 12/19/2016  . Contusion of hand 12/19/2016  . Contusion of hip 12/19/2016  . Contusion of knee 12/19/2016  . Degeneration of intervertebral disc at C4-C5 level 12/19/2016  . Degeneration of lumbar intervertebral disc 12/19/2016  . Disorder of coccyx 12/19/2016  . Dry eyes 12/19/2016  . Enthesopathy 12/19/2016  . Enthesopathy of hip region 12/19/2016  . Hand joint pain 12/19/2016  . Hand joint stiff 12/19/2016  . Hip pain 12/19/2016  . Joint pain 12/19/2016  . Knee pain 12/19/2016  . Localized, primary osteoarthritis 12/19/2016  . Loose body in hip joint 12/19/2016  . Lumbar sprain 12/19/2016  . Abnormal mammogram 12/19/2016  . Meibomian gland  dysfunction 12/19/2016  . Muscle weakness 12/19/2016  . Open fracture of lower end of forearm 12/19/2016  . Presbyopia 12/19/2016  . Regular astigmatism 12/19/2016  . Shoulder joint pain 12/19/2016  . Skin sensation disturbance 12/19/2016  . Sprain of ankle 12/19/2016  . Trochanteric bursitis 12/19/2016  . Wrist joint pain 12/19/2016  . Stiffness of wrist joint 12/19/2016  . Acute bilateral low back pain without sciatica 09/29/2016  . Acute bronchitis 06/19/2016  . Recurrent productive cough 06/14/2016  . Medicare annual wellness visit, subsequent 05/17/2016  . Fall in elderly patient 01/27/2016  . Sprain of wrist, right 01/26/2016  . Systolic CHF with reduced left ventricular function, NYHA class 3 ([place]) 01/10/2016  . Productive cough 11/19/2015  . Chronic systolic heart failure ([place]) 10/12/2015  . Dyspnea on exertion 08/31/2015  . Fatigue 08/31/2015  . 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 Irregular heart rhythm 07/22/2015  . Left arm pain 06/29/2015  . Dyslipidemia 06/03/2015  . Acute recurrent maxillary sinusitis 06/03/2015  . Oral mucosal lesion 05/26/2015  . Urge incontinence 02/14/2015  . Ankle edema 02/02/2015  . At risk for falling 01/25/2015  . Dizziness 01/25/2015  . Acid reflux 01/25/2015  . Big thyroid 01/25/2015  . Gravida 2 para 2 01/25/2015  . Personal history of malignant neoplasm of breast 01/25/2015  . Arthritis, degenerative 01/25/2015  . Parity 2 01/25/2015  . Peripheral blood vessel disorder ([place]) 01/25/2015  . Need for vaccination 01/25/2015  . Pain in rectum 01/25/2015  . Reflux 01/25/2015  . Screening for depression 01/25/2015  . Disease of accessory sinus 01/25/2015  .  Headache, temporal 01/25/2015  . Primary cancer of thymus ([place]) 01/25/2015  . Disease of thyroid gland 01/25/2015  . Avitaminosis D 01/25/2015  . Family history of diabetes mellitus in father 12/31/2014  . Fasting hyperglycemia 12/31/2014  . Hypertension 12/01/2014  . HLD  (hyperlipidemia) 12/01/2014  . Anxiety 12/01/2014  . Myofascial pain 12/01/2014  . Breast CA ([place]) 11/24/2014  . Absence of bladder continence 11/24/2014  . Urgency of micturation 11/24/2014  . Common bile duct dilatation 10/20/2014  . Pancreatic cyst 10/20/2014  . Kidney stones 10/20/2014  . Neuritis or radiculitis due to rupture of lumbar intervertebral disc 06/15/2014  . Lumbar canal stenosis 06/15/2014  . Degenerative arthritis of lumbar spine 06/15/2014  . Lumbar radiculitis 06/15/2014  . Osteoarthritis of spine with radiculopathy, lumbar region 06/15/2014  . 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 Bloodgood disease 11/05/2012  . Arthritis of temporomandibular joint 06/24/2012  . Atypical chest pain 01/05/2012  . Breath shortness 12/04/2011  . Lung mass 11/15/2011  . Thyroid nodule 11/15/2011  . Age-related macular degeneration, dry 10/03/2011  . Disorder of peripheral nervous system 10/03/2011  . Chronic rhinitis 03/03/2011  . Carotid artery narrowing 03/01/2011  . Polypharmacy 12/02/2010    Orientation RESPIRATION BLADDER Height & Weight     Self, Time, Situation, Place  Normal Continent Weight: 158 lb (71.7 kg) Height:  5\' 2"  (157.5 cm)  BEHAVIORAL SYMPTOMS/MOOD NEUROLOGICAL BOWEL NUTRITION STATUS      Continent Diet(Heart Healthy)  AMBULATORY STATUS COMMUNICATION OF NEEDS Skin   Extensive Assist Verbally Normal                       Personal Care Assistance Level of Assistance  Bathing, Feeding, Dressing Bathing Assistance: Limited assistance Feeding assistance: Independent Dressing Assistance: Limited assistance     Functional Limitations Info  Sight, Hearing, Speech Sight Info: Adequate Hearing Info: Impaired Speech Info: Adequate    SPECIAL CARE FACTORS FREQUENCY  PT (By licensed PT), OT (By licensed OT)     PT Frequency: (5) OT Frequency: (5)            Contractures      Additional Factors Info  Code Status, Allergies Code  Status Info: (Partial) Allergies Info: (2,4-D DIMETHYLAMINE (AMISOL), CELEBREX CELECOXIB, CIPROFLOXACIN, IBUPROFEN, METRONIDAZOLE, NAPROXEN, OTHER )           Current Medications (09/11/2017):  This is the current hospital active medication list Current Facility-Administered Medications  Medication Dose Route Frequency Provider Last Rate Last Dose  . acetaminophen (TYLENOL) tablet 650 mg  650 mg Oral Q6H PRN Fritzi Mandes, MD      . benzonatate (TESSALON) capsule 200 mg  200 mg Oral TID PRN Fritzi Mandes, MD   200 mg at 09/10/17 1659  . budesonide (PULMICORT) nebulizer solution 0.5 mg  0.5 mg Nebulization BID Vaughan Basta, MD   0.5 mg at 09/11/17 0741  . docusate sodium (COLACE) capsule 100 mg  100 mg Oral BID PRN Vaughan Basta, MD   100 mg at 09/10/17 0827  . heparin injection 5,000 Units  5,000 Units Subcutaneous Q8H Vaughan Basta, MD   5,000 Units at 09/11/17 1629  . hydrALAZINE (APRESOLINE) injection 10 mg  10 mg Intravenous Q6H PRN Fritzi Mandes, MD   10 mg at 09/10/17 1659  . ipratropium (ATROVENT) 0.06 % nasal spray 2 spray  2 spray Nasal QID Vaughan Basta, MD   2 spray at 09/11/17 [phone]  . loratadine (CLARITIN) tablet 10 mg  10 mg Oral  Caryn Bee, MD   10 mg at 09/10/17 2127  . losartan (COZAAR) tablet 100 mg  100 mg Oral Daily Vaughan Basta, MD   100 mg at 09/11/17 0850  . metoprolol tartrate (LOPRESSOR) tablet 50 mg  50 mg Oral BID Fritzi Mandes, MD   50 mg at 09/11/17 0851  . polyethylene glycol (MIRALAX / GLYCOLAX) packet 17 g  17 g Oral Daily Fritzi Mandes, MD   17 g at 09/10/17 2127  . senna-docusate (Senokot-S) tablet 1 tablet  1 tablet Oral BID Vaughan Basta, MD   1 tablet at 09/11/17 5208     Discharge Medications: Please see discharge summary for a list of discharge medications.  Relevant Imaging Results:  Relevant Lab Results:   Additional Information (SSN: [id number])  Smith Mince, Student-Social  Work

## 2017-09-11 NOTE — Progress Notes (Signed)
Chaplain followed up with Pt. Pt is being discharged today but is upset because she wants to be with her husband. Se wanted to see her husband before she left the hospital. Chaplain asked RN if that could be arraigned. RN agreed that it would be done. Chaplain prayed with pt. She asked if chaplain could come by Chaplain said we couldn't but we can pray for them where we are.    09/11/17 1300  Clinical Encounter Type  Visited With Patient  Visit Type Spiritual support  Referral From Nurse  Spiritual Encounters  Spiritual Needs Brochure;Emotional

## 2017-09-11 NOTE — Care Management (Addendum)
Jodi Bullock with [facility] home health has been made aware that patient will transition back to [facility] today. She has no payer for SNF under [facility] Observation status. RNCM received notification that patient ambulated 3 feet and at baseline uses a rollator to ambulate with.  Her husband is transitioning to SNF for rehab. PT is recommending SNF and OT evaluation on 09/11/17.   I spoke with her daughter Jodi Bullock and she states that patient will be alone at home and she is concerned.  She asked that I contact her brother Jodi Bullock [phone].  Per Jodi Bullock he agrees sister Jodi Bullock and he lives in [place]. He states that no one has reached out to him or his sister for assistance. [name] is handicap accessible and independent.  Bedside RN said that she and an assistant attempted to get patient to bedside commode and she couldn't move. I have sent message to MD with this concern. UR team updated. Dr. Posey Pronto agreed to inpatient. RNCM contacted patient's son again. He said he talked with patient- he said patient was very confused and not making sense with her speech. Son said patient did the same thing late yesterday evening.

## 2017-09-11 NOTE — Progress Notes (Signed)
Physical Therapy Treatment Patient Details Name: Jodi Bullock MRN: [id number] DOB: 1924/10/31 Today's Date: 09/11/2017    History of Present Illness Pt is a 84 y.o. female s/p injured her R hip and R knee sustained during a fall when trying to catch her husband as he fell. Conservative management recommended (non-op) for a fracture of the anterior cortex of trochanteric region on the right hip.  PMH includes bladder incontinence, breast CA, htn, neuropathy B feet, R humerus head fx 08/09/16, chronic L1 and L3 compression fx's, h/o colles fx/femur fx/radius/ulna fx, CHF.    PT Comments    Pt sitting in chair upon PT arrival and pt able to state name but only able to state year of birth (pt unable to state month and day even after therapist gave pt information); nursing notified.  Pt requiring 2 assist to stand and transfer recliner to bed using RW; pt able to advance R LE 2x's with increased effort but unable to advance L LE d/t R hip pain and instead pivoted on L LE).  Nursing present end of session and aware of pt's assist levels.  Pt continues to require significant assist levels and therapy continues to recommend STR.   Follow Up Recommendations  SNF     Equipment Recommendations  Rolling walker with 5" wheels;Wheelchair (measurements PT);Wheelchair cushion (measurements PT)    Recommendations for Other Services OT consult     Precautions / Restrictions Precautions Precautions: Fall Restrictions Weight Bearing Restrictions: Yes RLE Weight Bearing: Weight bearing as tolerated Other Position/Activity Restrictions: WBAT with walker    Mobility  Bed Mobility Overal bed mobility: Needs Assistance Bed Mobility: Sit to Supine       Sit to supine: Mod assist;Max assist;+2 for physical assistance   General bed mobility comments: assist for trunk and B LE's sit to supine  Transfers Overall transfer level: Needs assistance Equipment used: Rolling walker (2 wheeled) Transfers:  Sit to/from [facility] Sit to Stand: Mod assist;+2 physical assistance Stand pivot transfers: +2 physical assistance       General transfer comment: assist for UE and LE placement; vc's and assist to initiate and come to full stand; pt requiring max cueing for technique for transfer (pt able to take 2 small steps with R LE but mostly pivoted on L LE)  Ambulation/Gait             General Gait Details: Deferred d/t pt unable to take step with L LE (d/t R hip pain)   Stairs             Wheelchair Mobility    Modified Rankin (Stroke Patients Only)       Balance Overall balance assessment: Needs assistance Sitting-balance support: Bilateral upper extremity supported;Feet supported Sitting balance-Leahy Scale: Poor Sitting balance - Comments: requires UE support for static sitting balance   Standing balance support: Bilateral upper extremity supported Standing balance-Leahy Scale: Poor Standing balance comment: posterior lean standing initially requiring assist to shift weight forward (using RW)                            Cognition Arousal/Alertness: Awake/alert Behavior During Therapy: Anxious Overall Cognitive Status: (Pt oriented to name but not date of birth; intermittent confusion noted)  Exercises      General Comments General comments (skin integrity, edema, etc.): Purewick removed for sessions activities (nursing notified end of session to replace).  Nursing cleared pt for participation in physical therapy.  Pt agreeable to PT session.      Pertinent Vitals/Pain Pain Assessment: Faces Pain Score: 5  Faces Pain Scale: Hurts little more Pain Location: R hip Pain Descriptors / Indicators: Sore;Guarding Pain Intervention(s): Limited activity within patient's tolerance;Monitored during session;Repositioned  Vitals (HR and O2 on room air) stable and WFL throughout treatment  session.    Home Living                  Prior Function          PT Goals (current goals can now be found in the care plan section) Acute Rehab PT Goals Patient Stated Goal: to improve mobility PT Goal Formulation: With patient Time For Goal Achievement: 09/24/17 Potential to Achieve Goals: Fair Progress towards PT goals: Progressing toward goals    Frequency    7X/week      PT Plan Current plan remains appropriate    Co-evaluation              AM-PAC PT "6 Clicks" Daily Activity  Outcome Measure  Difficulty turning over in bed (including adjusting bedclothes, sheets and blankets)?: Unable Difficulty moving from lying on back to sitting on the side of the bed? : Unable Difficulty sitting down on and standing up from a chair with arms (e.g., wheelchair, bedside commode, etc,.)?: Unable Help needed moving to and from a bed to chair (including a wheelchair)?: Total Help needed walking in hospital room?: Total Help needed climbing 3-5 steps with a railing? : Total 6 Click Score: 6    End of Session Equipment Utilized During Treatment: Gait belt Activity Tolerance: Patient limited by pain Patient left: in bed;with call bell/phone within reach;with bed alarm set;with nursing/sitter in room;with SCD's reapplied;Other (comment)(B heels elevated via pillow; bed in lowest position) Nurse Communication: Mobility status;Precautions;Weight bearing status PT Visit Diagnosis: Other abnormalities of gait and mobility (R26.89);Muscle weakness (generalized) (M62.81);History of falling (Z91.81);Difficulty in walking, not elsewhere classified (R26.2);Pain Pain - Right/Left: Right Pain - part of body: Hip     Time: 0034-9179 PT Time Calculation (min) (ACUTE ONLY): 23 min  Charges:  $Therapeutic Activity: 23-37 mins                    G CodesLeitha Bleak, PT 09/11/17, 5:09 PM [phone]

## 2017-09-11 NOTE — Progress Notes (Signed)
Pts son Jodi Bullock called and wanted update. Pts son is concerned about pt discharging home, and states that his "mother isn't making sense". Jodi Bullock notified that nurse will reach out to MD Posey Pronto and have her call him. MD Posey Pronto notified and given pts phone number.

## 2017-09-11 NOTE — Care Management Note (Signed)
Case Management Note  Patient Details  Name: Jodi Bullock MRN: [id number] Date of Birth: [birth year]  Subjective/Objective:  Discharging today                  Action/Plan: [facility] notified of discharge and will make a visit within 24 hours. Patient will discharge by EMS. EMS form completed Primary nurse updated.   Expected Discharge Date:  09/11/17               Expected Discharge Plan:  [place]  In-House Referral:     Discharge planning Services  CM Consult  Post Acute Care Choice:  Home Health Choice offered to:  Patient  DME Arranged:    DME Agency:     HH Arranged:  PT HH Agency:  [facility]  Status of Service:  Completed, signed off  If discussed at [place] of Stay Meetings, dates discussed:    Additional Comments:  Jolly Mango, RN 09/11/2017, 1:23 PM

## 2017-09-11 NOTE — Progress Notes (Signed)
Patient ID: Jodi Bullock, female   DOB: 10-22-1924, 84 y.o.   MRN: [id number] CT head negative for CVA UA still pending

## 2017-09-12 MED ORDER — MUSCLE RUB 10-15 % EX CREA: TOPICAL_CREAM | CUTANEOUS | Status: DC | PRN

## 2017-09-12 MED ORDER — LOPERAMIDE HCL 2 MG PO CAPS
2.0000 mg | ORAL_CAPSULE | Freq: Four times a day (QID) | ORAL | Status: DC | PRN
  Administered 2017-09-12: 2 mg via ORAL
  Filled 2017-09-12: qty 1

## 2017-09-12 MED ORDER — TROLAMINE SALICYLATE 10 % EX CREA
TOPICAL_CREAM | CUTANEOUS | Status: DC | PRN
  Administered 2017-09-12 (×2): via TOPICAL
  Administered 2017-09-13: 1 via TOPICAL
  Administered 2017-09-13: 18:00:00 via TOPICAL
  Filled 2017-09-12: qty 85

## 2017-09-12 NOTE — Care Management (Signed)
[facility] made aware that patient will discharge to [facility] 5/3

## 2017-09-12 NOTE — Clinical Social Work Placement (Signed)
   CLINICAL SOCIAL WORK PLACEMENT  NOTE  Date:  09/12/2017  Patient Details  Name: Jodi Bullock MRN: [id number] Date of Birth: 09/16/1924  Clinical Social Work is seeking post-discharge placement for this patient at the [place] level of care (*CSW will initial, date and re-position this form in  chart as items are completed):  Yes   Patient/family provided with [place] Work Department's list of facilities offering this level of care within the geographic area requested by the patient (or if unable, by the patient's family).  Yes   Patient/family informed of their freedom to choose among providers that offer the needed level of care, that participate in Medicare, Medicaid or managed care program needed by the patient, have an available bed and are willing to accept the patient.  Yes   Patient/family informed of [facility]'s ownership interest in [facility] and [facility], as well as of the fact that they are under no obligation to receive care at these facilities.  PASRR submitted to EDS on 09/11/17     PASRR number received on 09/11/17     Existing PASRR number confirmed on       FL2 transmitted to all facilities in geographic area requested by pt/family on 09/11/17     FL2 transmitted to all facilities within larger geographic area on       Patient informed that his/her managed care company has contracts with or will negotiate with certain facilities, including the following:        Yes   Patient/family informed of bed offers received.  Patient chooses bed at [facility] )     Physician recommends and patient chooses bed at      Patient to be transferred to   on  .  Patient to be transferred to facility by       Patient family notified on   of transfer.  Name of family member notified:        PHYSICIAN       Additional Comment:    _______________________________________________ Mikaelyn Arthurs, Veronia Beets, LCSW 09/12/2017,  11:49 AM

## 2017-09-12 NOTE — Progress Notes (Signed)
Clinical Education officer, museum (CSW) discussed case with physician advisor about inpatient status. Per physician advisor patient will remain inpatient. CSW made attending MD aware of above. FL2 complete and faxed out.   CSW met with patient and made her aware of above. Patient requested to go to [place] to be with her husband. Per [facility] admissions coordinator at [facility] patient can come on Friday 09/14/17. Patient is aware of above and accepted bed offer at [facility].   Plan is for patient to D/C to [place] Friday 09/14/17 pending medical clearance. Patient is aware of above. CSW contacted patient's son Marya Amsler and made him aware of above.   [employer], LCSW [phone]

## 2017-09-12 NOTE — Progress Notes (Signed)
Physical Therapy Treatment Patient Details Name: Jodi Bullock MRN: [id number] DOB: 06/23/1924 Today's Date: 09/12/2017    History of Present Illness Pt is a 84 y.o. female s/p injured her R hip and R knee sustained during a fall when trying to catch her husband as he fell. Conservative management recommended (non-op) for a fracture of the anterior cortex of trochanteric region on the right hip.  PMH includes bladder incontinence, breast CA, htn, neuropathy B feet, R humerus head fx 08/09/16, chronic L1 and L3 compression fx's, h/o colles fx/femur fx/radius/ulna fx, CHF.    PT Comments    Pt able to progress today to min assist supine to sit and max assist x1 to stand with RW; able to walk a few feet from bed to chair with RW with min to mod assist x1.  Pt given max cues for above mobility and extra time spent with visual demo and verbal cues for all activities; pt also educated to increase UE support through RW to minimize [place] through R LE to help with pain control.  Pain R hip 1/10 at rest beginning and end of session but 8/10 with activity.  Will continue to progress strengthening and progressive functional mobility per pt tolerance.    Follow Up Recommendations  SNF     Equipment Recommendations  Rolling walker with 5" wheels;Wheelchair (measurements PT);Wheelchair cushion (measurements PT)    Recommendations for Other Services OT consult     Precautions / Restrictions Precautions Precautions: Fall Restrictions Weight Bearing Restrictions: Yes RLE Weight Bearing: Weight bearing as tolerated Other Position/Activity Restrictions: WBAT with walker    Mobility  Bed Mobility Overal bed mobility: Needs Assistance Bed Mobility: Supine to Sit     Supine to sit: Min assist;HOB elevated     General bed mobility comments: assist for R LE; vc's for technique; increased time and effort to perform; use of bed rail  Transfers Overall transfer level: Needs assistance Equipment used:  Rolling walker (2 wheeled) Transfers: Sit to/from Stand Sit to Stand: Max assist         General transfer comment: assist for UE and LE placement; vc's and assist to initiate and come to full stand; pt requiring max cueing for technique for transfer  Ambulation/Gait Ambulation/Gait assistance: Min assist;Mod assist Ambulation Distance (Feet): 3 Feet(bed to chair) Assistive device: Rolling walker (2 wheeled) Gait Pattern/deviations: Step-to pattern Gait velocity: decreased   General Gait Details: pt requiring max cueing for technique and to increase UE support through RW to minimize weight through R LE (to decrease R hip pain as much as possible).   Stairs             Wheelchair Mobility    Modified Rankin (Stroke Patients Only)       Balance Overall balance assessment: Needs assistance Sitting-balance support: Bilateral upper extremity supported;Feet supported Sitting balance-Leahy Scale: Poor Sitting balance - Comments: requires UE support for static sitting balance   Standing balance support: Bilateral upper extremity supported Standing balance-Leahy Scale: Poor                              Cognition Arousal/Alertness: Awake/alert Behavior During Therapy: Anxious Overall Cognitive Status: Within Functional Limits for tasks assessed                                        Exercises Total  Joint Exercises Ankle Circles/Pumps: AROM;Strengthening;Both;10 reps;Supine Quad Sets: AROM;Strengthening;Both;10 reps;Supine Gluteal Sets: AROM;Strengthening;Both;10 reps;Supine Short Arc Quad: AROM;Strengthening;Right;10 reps;Supine Heel Slides: AAROM;Strengthening;Right;10 reps;Supine    General Comments   Nursing cleared pt for participation in physical therapy.  Pt agreeable to PT session.      Pertinent Vitals/Pain Pain Assessment: 0-10 Pain Score: 1 (1/10 at rest end of session; 8/10 with activity) Pain Location: R hip Pain  Descriptors / Indicators: Sore;Guarding Pain Intervention(s): Limited activity within patient's tolerance;Monitored during session;Premedicated before session;Repositioned  Vitals (HR and O2 on room air) stable and WFL throughout treatment session.    Home Living                      Prior Function            PT Goals (current goals can now be found in the care plan section) Acute Rehab PT Goals Patient Stated Goal: to improve mobility PT Goal Formulation: With patient Time For Goal Achievement: 09/24/17 Potential to Achieve Goals: Fair Progress towards PT goals: Progressing toward goals    Frequency    7X/week      PT Plan Current plan remains appropriate    Co-evaluation              AM-PAC PT "6 Clicks" Daily Activity  Outcome Measure  Difficulty turning over in bed (including adjusting bedclothes, sheets and blankets)?: Unable Difficulty moving from lying on back to sitting on the side of the bed? : Unable Difficulty sitting down on and standing up from a chair with arms (e.g., wheelchair, bedside commode, etc,.)?: Unable Help needed moving to and from a bed to chair (including a wheelchair)?: A Lot Help needed walking in hospital room?: Total Help needed climbing 3-5 steps with a railing? : Total 6 Click Score: 7    End of Session Equipment Utilized During Treatment: Gait belt Activity Tolerance: Patient limited by pain Patient left: in chair;with call bell/phone within reach;with chair alarm set;Other (comment)(B heels elevated via pillow) Nurse Communication: Mobility status;Precautions;Weight bearing status PT Visit Diagnosis: Other abnormalities of gait and mobility (R26.89);Muscle weakness (generalized) (M62.81);History of falling (Z91.81);Difficulty in walking, not elsewhere classified (R26.2);Pain Pain - Right/Left: Right Pain - part of body: Hip     Time: 1001-1039 PT Time Calculation (min) (ACUTE ONLY): 38 min  Charges:  $Therapeutic  Exercise: 8-22 mins $Therapeutic Activity: 23-37 mins                    G CodesLeitha Bleak, PT 09/12/17, 11:26 AM [phone]

## 2017-09-12 NOTE — Progress Notes (Signed)
Pt having several loose stools. Per pt this has been ongoing prior to admission. Notified md. Immodium ordered prn for loose stools. Will admin and monitor pt.

## 2017-09-12 NOTE — Progress Notes (Signed)
[facility] NAME: Jodi Bullock    MR#:  [id number]  DATE OF BIRTH:  02-Nov-1924  SUBJECTIVE:  patient is more awake alert. Denies any complaints.  REVIEW OF SYSTEMS:   Review of Systems  Constitutional: Negative for chills, fever and weight loss.  HENT: Negative for ear discharge, ear pain and nosebleeds.   Eyes: Negative for blurred vision, pain and discharge.  Respiratory: Negative for sputum production, shortness of breath, wheezing and stridor.   Cardiovascular: Negative for chest pain, palpitations, orthopnea and PND.  Gastrointestinal: Negative for abdominal pain, diarrhea, nausea and vomiting.  Genitourinary: Negative for frequency and urgency.  Musculoskeletal: Positive for joint pain. Negative for back pain.  Neurological: Negative for sensory change, speech change, focal weakness and weakness.  Psychiatric/Behavioral: Negative for depression and hallucinations. The patient is not nervous/anxious.    Tolerating Diet: Tolerating PT:   DRUG ALLERGIES:   Allergies  Allergen Reactions  . 2,4-D Dimethylamine (Amisol) Other (See Comments)    Other Reaction: OTHER REACTION-IRRITATION TO Esophagous  . Celebrex [Celecoxib] Other (See Comments)     esophagitis Esophageal spasms    . Ciprofloxacin Other (See Comments)    Esophageal irritation  . Ibuprofen     Other reaction(s): Other (See Comments) reflux  . Metronidazole Other (See Comments)    neuropathy  . Naproxen Other (See Comments)    GI UPSET  . Other Other (See Comments)    Other Reaction: esophagitis    VITALS:  Blood pressure (!) 146/61, pulse 86, temperature 98.1 F (36.7 C), temperature source Oral, resp. rate 16, height 5\' 2"  (1.575 m), weight 72.6 kg (160 lb), SpO2 93 %.  PHYSICAL EXAMINATION:   Physical Exam  GENERAL:  84 y.o.-year-old patient lying in the bed with no acute distress.  EYES: Pupils equal, round, reactive to light and  accommodation. No scleral icterus. Extraocular muscles intact.  HEENT: Head atraumatic, normocephalic. Oropharynx and nasopharynx clear.  NECK:  Supple, no jugular venous distention. No thyroid enlargement, no tenderness.  LUNGS: Normal breath sounds bilaterally, no wheezing, rales, rhonchi. No use of accessory muscles of respiration.  CARDIOVASCULAR: S1, S2 normal. No murmurs, rubs, or gallops.  ABDOMEN: Soft, nontender, nondistended. Bowel sounds present. No organomegaly or mass.  EXTREMITIES: No cyanosis, clubbing or edema b/l.    NEUROLOGIC: Cranial nerves II through XII are intact. No focal Motor or sensory deficits b/l.   PSYCHIATRIC:  patient is alert and oriented x 3.  SKIN: No obvious rash, lesion, or ulcer.   LABORATORY PANEL:  CBC Recent Labs  Lab 09/10/17 0319  WBC 7.1  HGB 13.0  HCT 38.3  PLT 190    Chemistries  Recent Labs  Lab 09/09/17 1405 09/10/17 0319  NA 141 141  K 4.2 3.7  CL 105 106  CO2 27 28  GLUCOSE 115* 114*  BUN 15 16  CREATININE 0.69 0.63  CALCIUM 9.0 8.7*  AST 36  --   ALT 45  --   ALKPHOS 72  --   BILITOT 0.7  --    Cardiac Enzymes No results for input(s): TROPONINI in the last 168 hours. RADIOLOGY:  Ct Head Wo Contrast  Result Date: 09/11/2017 CLINICAL DATA:  Altered mental status. EXAM: CT HEAD WITHOUT CONTRAST TECHNIQUE: Contiguous axial images were obtained from the base of the skull through the vertex without intravenous contrast. COMPARISON:  CT scan of August 13, 2014. FINDINGS: Brain: Mild diffuse cortical atrophy is noted. Mild chronic  ischemic white matter disease is noted. No mass effect or midline shift is noted. Ventricular size is within normal limits. There is no evidence of mass lesion, hemorrhage or acute infarction. Vascular: No hyperdense vessel or unexpected calcification. Skull: Normal. Negative for fracture or focal lesion. Sinuses/Orbits: Bilateral ethmoid and maxillary sinusitis is noted. Other: None. IMPRESSION: Mild  diffuse cortical atrophy. Mild chronic ischemic white matter disease. No acute intracranial abnormality seen. Electronically Signed   By: Marijo Conception, M.D.   On: 09/11/2017 18:43   ASSESSMENT AND PLAN:  Jodi Bullock a84 y.o.femalewith a known history of arthritis, bladder incontinence, breast cancer, chronic systolic CHF, depression, gastroesophageal reflux disease, hyperlipidemia, hypertension, thymus cancer- lives [facility] with her husband. Her husband is having weakness and cough with low-grade fever for last 2 days and had an episode of stumbling while going to the cafeteria. While trying to help him, she lost her balance and fell down and since then having pain in her right hip.  *Acute encephalopathy -multifactorial suspected due to pain meds versus increasing anxiety versus rule out infection  -CT head negative -dis charge held due to confusion -pt much better today -UA negative for UTI  * right femur fractures/p mechanical fall Orthopedic consultwith Dr Kathryne Hitch operative fracture. OK to start PT Pain management and physical therapy evaluation--HHPT  * hypertension Continue home medications, currently elevated due to pain.  * hyperlipidemia Continue home medication.  * chronic systolic CHF Currently no signs of extubation, continue to monitor.  * D/c planning to  rehab  discussed with granddaughter in the room Case discussed with Care Management/Social Worker. Management plans discussed with the patient, family and they are in agreement.  CODE STATUS: partial  DVT Prophylaxis: lovenox  TOTAL TIME TAKING CARE OF THIS PATIENT: *30* minutes.  >50% time spent on counselling and coordination of care  POSSIBLE D/C IN *1-2 DAYS, DEPENDING ON CLINICAL CONDITION.  Note: This dictation was prepared with Dragon dictation along with smaller phrase technology. Any transcriptional errors that result from this process  are unintentional.  Fritzi Mandes M.D on 09/12/2017 at 3:15 PM  Between 7am to 6pm - Pager - [phone]  After 6pm go to [email or web address] - Proofreader  [facility]  Office  [phone]  CC: Primary care physician; Tracie Harrier, MDPatient ID: Jodi Bullock, female   DOB: 03-14-1925, 84 y.o.   MRN: [id number]

## 2017-09-13 LAB — GLUCOSE, CAPILLARY
Glucose-Capillary: 111 mg/dL — ABNORMAL HIGH (ref 65–99)
Glucose-Capillary: 151 mg/dL — ABNORMAL HIGH (ref 65–99)

## 2017-09-13 LAB — CBC
HEMATOCRIT: 36 % (ref 35.0–47.0)
Hemoglobin: 12.2 g/dL (ref 12.0–16.0)
MCH: 30.5 pg (ref 26.0–34.0)
MCHC: 33.9 g/dL (ref 32.0–36.0)
MCV: 89.9 fL (ref 80.0–100.0)
PLATELETS: 187 10*3/uL (ref 150–440)
RBC: 4 MIL/uL (ref 3.80–5.20)
RDW: 12.6 % (ref 11.5–14.5)
WBC: 10.9 10*3/uL (ref 3.6–11.0)

## 2017-09-13 NOTE — Progress Notes (Signed)
Occupational Therapy Treatment Patient Details Name: Jodi Bullock MRN: [id number] DOB: Oct 09, 1924 Today's Date: 09/13/2017    History of present illness Pt is a 84 y.o. female s/p injured her R hip and R knee sustained during a fall when trying to catch her husband as he fell. Conservative management recommended (non-op) for a fracture of the anterior cortex of trochanteric region on the right hip.  PMH includes bladder incontinence, breast CA, htn, neuropathy B feet, R humerus head fx 08/09/16, chronic L1 and L3 compression fx's, h/o colles fx/femur fx/radius/ulna fx, CHF.   OT comments  Pt seen for OT tx this date. Pt extremely anxious, verbalizing worries and concerns about her spouse who she is unable to communicate with because both have hearing aides that require new batteries. OT assisted pt in calling her spouse but pt reported that her spouse was unable to hear her. Pt instructed in relaxation strategies including pleasant imagery, music, and deep breathing exercises to decrease anxiety and decrease HR back down to near resting. Pt HR noted to be 116 but with guided deep breathing, pt able to decrease HR to 78. Pt also noted neuropathy in RLE causing discomfort and requesting cream be applied to her RLE. With RN consent, OT applied. Required occasional verbal cues to continue to utilize as she would becoming increasingly anxious and continue talking about concerns, worries about the future, etc. Offered to contact the chaplain to come visit with the pt but she stated "I don't know, I don't know." Pt stated that she typically plays the piano every morning for her spouse but is unable to do that to help her calm down. Encouraged pt to listen to instrumental music channel on tv. OT turned on and pt noted how beautiful the scenery was and how she enjoyed the music. Encouraged pt to continue with deep breathing and to listen to music to help her during this difficult time. Pt limited this session with  mobility this afternoon secondary to anxiety and worry about her situation and her spouse. Pt continues to benefit from skilled OT services to maximize return to PLOF and minimize risk of functional decline, future falls, and caregiver burden.    Follow Up Recommendations  SNF    Equipment Recommendations       Recommendations for Other Services      Precautions / Restrictions Precautions Precautions: Fall Precaution Comments: monitor heart rate - very anxious/worried but able to bring HR down with deep breathing Restrictions Weight Bearing Restrictions: Yes RLE Weight Bearing: Weight bearing as tolerated Other Position/Activity Restrictions: WBAT with walker       Mobility Bed Mobility     General bed mobility comments: deferred, up in recliner  Transfers    Balance                           ADL either performed or assessed with clinical judgement   ADL Overall ADL's : Needs assistance/impaired                                       General ADL Comments: With RN consent, OT applied cream to RLE to help pt with neuropathy.      Vision Baseline Vision/History: Wears glasses;Macular Degeneration Patient Visual Report: No change from baseline     Perception     Praxis      Cognition Arousal/Alertness: Awake/alert  Behavior During Therapy: Anxious Overall Cognitive Status: Within Functional Limits for tasks assessed                                          Exercises Other Exercises Other Exercises: Pt instructed in relaxation strategies including pleasant imagery, music, and deep breathing exercises to decrease anxiety and decrease HR back down to rest. Pt HR noted to be 116 but with guided deep breathing, pt able to decrease HR to 78. Required occasional verbal cues to continue to utilize as she would becoming increasingly anxious and continue talking about concerns, worries about the future, etc.   Shoulder  Instructions       General Comments     Pertinent Vitals/ Pain       Pain Assessment: Faces Faces Pain Scale: Hurts a little bit Pain Location: R hip Pain Descriptors / Indicators: Sore;Guarding Pain Intervention(s): Limited activity within patient's tolerance;Monitored during session  Home Living                                          Prior Functioning/Environment              Frequency  Min 1X/week        Progress Toward Goals  OT Goals(current goals can now be found in the care plan section)  Progress towards OT goals: OT to reassess next treatment;Progressing toward goals  Acute Rehab OT Goals Patient Stated Goal: to improve mobility OT Goal Formulation: With patient Potential to Achieve Goals: Good  Plan Discharge plan remains appropriate;Frequency remains appropriate    Co-evaluation                 AM-PAC PT "6 Clicks" Daily Activity     Outcome Measure   Help from another person eating meals?: None Help from another person taking care of personal grooming?: None Help from another person toileting, which includes using toliet, bedpan, or urinal?: A Lot Help from another person bathing (including washing, rinsing, drying)?: A Lot Help from another person to put on and taking off regular upper body clothing?: A Little Help from another person to put on and taking off regular lower body clothing?: A Lot 6 Click Score: 17    End of Session    OT Visit Diagnosis: Unsteadiness on feet (R26.81);Muscle weakness (generalized) (M62.81);Pain Pain - Right/Left: Right Pain - part of body: Hip   Activity Tolerance (pt very anxious)   Patient Left in chair;with call bell/phone within reach;with chair alarm set   Nurse Communication Other (comment)(cleared to apply cream to RLE)        Time: 1610-9604 OT Time Calculation (min): 51 min  Charges: OT General Charges $OT Visit: 1 Visit OT Treatments $Self Care/Home Management :  38-52 mins  Jeni Salles, MPH, MS, OTR/L ascom [phone] 09/13/17, 5:30 PM

## 2017-09-13 NOTE — Progress Notes (Signed)
Appears to be sleeping on rounds. Call bell in reach.

## 2017-09-13 NOTE — Progress Notes (Signed)
Plan is for patient to D/C to [facility] tomorrow pending medical clearance. [facility] admissions coordinator at [facility] is aware of above.   [employer], LCSW [phone]

## 2017-09-13 NOTE — Progress Notes (Signed)
Physical Therapy Treatment Patient Details Name: Jodi Bullock MRN: [id number] DOB: 02-Apr-1925 Today's Date: 09/13/2017    History of Present Illness Pt is a 84 y.o. female s/p injured her R hip and R knee sustained during a fall when trying to catch her husband as he fell. Conservative management recommended (non-op) for a fracture of the anterior cortex of trochanteric region on the right hip.  PMH includes bladder incontinence, breast CA, htn, neuropathy B feet, R humerus head fx 08/09/16, chronic L1 and L3 compression fx's, h/o colles fx/femur fx/radius/ulna fx, CHF.    PT Comments    Upon entering pt's room, pt appearing upset and concerned that she wasn't making progress with her functional mobility.  Therapist listed to pt's concerns and educated pt on her progress made yesterday and plan for today; pt appearing anxious in general during session but pt appearing with improved mood by end of session.  Pt able to progress to ambulating 20 feet with RW min assist x1 with chair follow; pt also progressed to stand from bed with mod assist x1 and use of RW.  Pt reporting and appearing in mild pain in R hip during session's activities and pt appeared to be able to tolerate moving R LE better (less painful today).  Will continue to focus on progressing strength, balance, and functional mobility during hospital stay.    Follow Up Recommendations  SNF     Equipment Recommendations  Rolling walker with 5" wheels;Wheelchair (measurements PT);Wheelchair cushion (measurements PT)    Recommendations for Other Services OT consult     Precautions / Restrictions Precautions Precautions: Fall Restrictions Weight Bearing Restrictions: Yes RLE Weight Bearing: Weight bearing as tolerated Other Position/Activity Restrictions: WBAT with walker    Mobility  Bed Mobility Overal bed mobility: Needs Assistance Bed Mobility: Supine to Sit     Supine to sit: Min assist;HOB elevated     General bed  mobility comments: assist for R LE; vc's for technique; mild increased time and effort to perform; vc's for use of bed rail  Transfers Overall transfer level: Needs assistance Equipment used: Rolling walker (2 wheeled) Transfers: Sit to/from Stand Sit to Stand: Mod assist         General transfer comment: assist for UE and LE placement; vc's and assist to initiate and come to full stand; pt requiring cueing for technique for transfer  Ambulation/Gait Ambulation/Gait assistance: Min assist;+2 safety/equipment(chair follow for safety) Ambulation Distance (Feet): 20 Feet Assistive device: Rolling walker (2 wheeled) Gait Pattern/deviations: Step-to pattern Gait velocity: decreased   General Gait Details: pt requiring intermittent cueing for technique and to increase UE support through RW to minimize weight through R LE (to decrease R hip pain as much as possible); R knee flexing during R LE stance phase and decreased stance time noted R LE   Stairs             Wheelchair Mobility    Modified Rankin (Stroke Patients Only)       Balance Overall balance assessment: Needs assistance Sitting-balance support: Bilateral upper extremity supported;Feet supported Sitting balance-Leahy Scale: Poor Sitting balance - Comments: requires UE support for static sitting balance   Standing balance support: Bilateral upper extremity supported Standing balance-Leahy Scale: Poor Standing balance comment: mild posterior lean standing initially requiring assist to shift weight forward (using RW)                            Cognition Arousal/Alertness: Awake/alert  Behavior During Therapy: Anxious Overall Cognitive Status: Within Functional Limits for tasks assessed                                        Exercises      General Comments General comments (skin integrity, edema, etc.): Purewick removed for sessions activities (nursing notified end of session to  replace).  Nursing cleared pt for participation in physical therapy.  Pt agreeable to PT session.      Pertinent Vitals/Pain Pain Assessment: Faces Faces Pain Scale: Hurts little more Pain Location: R hip Pain Descriptors / Indicators: Sore;Guarding Pain Intervention(s): Limited activity within patient's tolerance;Monitored during session;Repositioned;RN gave pain meds during session    Home Living                      Prior Function            PT Goals (current goals can now be found in the care plan section) Acute Rehab PT Goals Patient Stated Goal: to improve mobility PT Goal Formulation: With patient Time For Goal Achievement: 09/24/17 Potential to Achieve Goals: Good Progress towards PT goals: Progressing toward goals    Frequency    7X/week      PT Plan Current plan remains appropriate    Co-evaluation              AM-PAC PT "6 Clicks" Daily Activity  Outcome Measure  Difficulty turning over in bed (including adjusting bedclothes, sheets and blankets)?: Unable Difficulty moving from lying on back to sitting on the side of the bed? : Unable Difficulty sitting down on and standing up from a chair with arms (e.g., wheelchair, bedside commode, etc,.)?: Unable Help needed moving to and from a bed to chair (including a wheelchair)?: A Lot Help needed walking in hospital room?: A Little Help needed climbing 3-5 steps with a railing? : Total 6 Click Score: 9    End of Session Equipment Utilized During Treatment: Gait belt Activity Tolerance: Patient limited by pain Patient left: in chair;with call bell/phone within reach;with chair alarm set;Other (comment)(B heels elevated via pillow) Nurse Communication: Mobility status;Precautions;Weight bearing status PT Visit Diagnosis: Other abnormalities of gait and mobility (R26.89);Muscle weakness (generalized) (M62.81);History of falling (Z91.81);Difficulty in walking, not elsewhere classified  (R26.2);Pain Pain - Right/Left: Right Pain - part of body: Hip     Time: 7353-2992 PT Time Calculation (min) (ACUTE ONLY): 45 min  Charges:  $Gait Training: 8-22 mins $Therapeutic Activity: 23-37 mins                    G CodesLeitha Bleak, PT 09/13/17, 2:06 PM [phone]

## 2017-09-13 NOTE — Progress Notes (Signed)
OT Cancellation Note  Patient Details Name: Shritha Bresee MRN: [id number] DOB: May 04, 1925   Cancelled Treatment:    Reason Eval/Treat Not Completed: Other (comment). Upon attempt, PT starting therapy session. Will re-attempt at later time as pt is available for OT treatment.   Jeni Salles, MPH, MS, OTR/L ascom [phone] 09/13/17, 10:00 AM

## 2017-09-13 NOTE — Progress Notes (Signed)
[place] at [place] NAME: Jodi Bullock    MR#:  [id number]  DATE OF BIRTH:  [birth year]  SUBJECTIVE:  patient is more awake alert. Denies any complaints.  REVIEW OF SYSTEMS:   Review of Systems  Constitutional: Negative for chills, fever and weight loss.  HENT: Negative for ear discharge, ear pain and nosebleeds.   Eyes: Negative for blurred vision, pain and discharge.  Respiratory: Negative for sputum production, shortness of breath, wheezing and stridor.   Cardiovascular: Negative for chest pain, palpitations, orthopnea and PND.  Gastrointestinal: Negative for abdominal pain, diarrhea, nausea and vomiting.  Genitourinary: Negative for frequency and urgency.  Musculoskeletal: Positive for joint pain. Negative for back pain.  Neurological: Negative for sensory change, speech change, focal weakness and weakness.  Psychiatric/Behavioral: Negative for depression and hallucinations. The patient is not nervous/anxious.    Tolerating Diet:yes Tolerating PT: rehab  DRUG ALLERGIES:   Allergies  Allergen Reactions  . 2,4-D Dimethylamine (Amisol) Other (See Comments)    Other Reaction: OTHER REACTION-IRRITATION TO Esophagous  . Celebrex [Celecoxib] Other (See Comments)     esophagitis Esophageal spasms    . Ciprofloxacin Other (See Comments)    Esophageal irritation  . Ibuprofen     Other reaction(s): Other (See Comments) reflux  . Metronidazole Other (See Comments)    neuropathy  . Naproxen Other (See Comments)    GI UPSET  . Other Other (See Comments)    Other Reaction: esophagitis    VITALS:  Blood pressure (!) 164/73, pulse 88, temperature 98.3 F (36.8 C), temperature source Oral, resp. rate 16, height 5\' 2"  (1.575 m), weight 72.7 kg (160 lb 3.2 oz), SpO2 98 %.  PHYSICAL EXAMINATION:   Physical Exam  GENERAL:  84 y.o.-year-old patient lying in the bed with no acute distress.  EYES: Pupils equal, round, reactive to  light and accommodation. No scleral icterus. Extraocular muscles intact.  HEENT: Head atraumatic, normocephalic. Oropharynx and nasopharynx clear.  NECK:  Supple, no jugular venous distention. No thyroid enlargement, no tenderness.  LUNGS: Normal breath sounds bilaterally, no wheezing, rales, rhonchi. No use of accessory muscles of respiration.  CARDIOVASCULAR: S1, S2 normal. No murmurs, rubs, or gallops.  ABDOMEN: Soft, nontender, nondistended. Bowel sounds present. No organomegaly or mass.  EXTREMITIES: No cyanosis, clubbing or edema b/l.    NEUROLOGIC: Cranial nerves II through XII are intact. No focal Motor or sensory deficits b/l.   PSYCHIATRIC:  patient is alert and oriented x 3.  SKIN: No obvious rash, lesion, or ulcer.   LABORATORY PANEL:  CBC Recent Labs  Lab 09/13/17 0432  WBC 10.9  HGB 12.2  HCT 36.0  PLT 187    Chemistries  Recent Labs  Lab 09/09/17 1405 09/10/17 0319  NA 141 141  K 4.2 3.7  CL 105 106  CO2 27 28  GLUCOSE 115* 114*  BUN 15 16  CREATININE 0.69 0.63  CALCIUM 9.0 8.7*  AST 36  --   ALT 45  --   ALKPHOS 72  --   BILITOT 0.7  --    Cardiac Enzymes No results for input(s): TROPONINI in the last 168 hours. RADIOLOGY:  Ct Head Wo Contrast  Result Date: 09/11/2017 CLINICAL DATA:  Altered mental status. EXAM: CT HEAD WITHOUT CONTRAST TECHNIQUE: Contiguous axial images were obtained from the base of the skull through the vertex without intravenous contrast. COMPARISON:  CT scan of August 13, 2014. FINDINGS: Brain: Mild diffuse cortical atrophy is noted.  Mild chronic ischemic white matter disease is noted. No mass effect or midline shift is noted. Ventricular size is within normal limits. There is no evidence of mass lesion, hemorrhage or acute infarction. Vascular: No hyperdense vessel or unexpected calcification. Skull: Normal. Negative for fracture or focal lesion. Sinuses/Orbits: Bilateral ethmoid and maxillary sinusitis is noted. Other: None.  IMPRESSION: Mild diffuse cortical atrophy. Mild chronic ischemic white matter disease. No acute intracranial abnormality seen. Electronically Signed   By: Marijo Conception, M.D.   On: 09/11/2017 18:43   ASSESSMENT AND PLAN:  [name] a84 y.o.femalewith a known history of arthritis, bladder incontinence, breast cancer, chronic systolic CHF, depression, gastroesophageal reflux disease, hyperlipidemia, hypertension, thymus cancer- [facility] with her husband. Her husband is having weakness and cough with low-grade fever for last 2 days and had an episode of stumbling while going to the cafeteria. While trying to help him, she lost her balance and fell down and since then having pain in her right hip.  *Acute encephalopathy--resolved -multifactorial suspected due to pain meds versus increasing anxiety  -CT head negative -discharge held due to confusion -pt much better today -UA negative for UTI  * right femur fractures/p mechanical fall Orthopedic consultwith Dr Kathryne Hitch operative fracture. OK to start PT Pain management and physical therapy evaluation--HHPT  * hypertension Continue home medications, currently elevated due to pain.  * hyperlipidemia Continue home medication.  * chronic systolic CHF Currently no signs of extubation, continue to monitor.  * D/c planning to  rehab   Case discussed with Care Management/Social Worker. Management plans discussed with the patient, family and they are in agreement.  CODE STATUS: partial  DVT Prophylaxis: lovenox  TOTAL TIME TAKING CARE OF THIS PATIENT: *30* minutes.  >50% time spent on counselling and coordination of care  POSSIBLE D/C IN *1-2 DAYS, DEPENDING ON CLINICAL CONDITION.  Note: This dictation was prepared with Dragon dictation along with smaller phrase technology. Any transcriptional errors that result from this process are unintentional.  Jodi Bullock M.D on  09/13/2017 at 2:40 PM  Between 7am to 6pm - Pager - [phone]  After 6pm go to [email or web address] - Proofreader  [facility]  Office  [phone]  CC: Primary care physician; Tracie Harrier, MDPatient ID: Jodi Bullock, female   DOB: 1925-02-11, 84 y.o.   MRN: [id number]

## 2017-09-13 NOTE — Progress Notes (Signed)
Slept in intervals. Repositioned through the shift. External catheter placed for pt comfort. Call bell in reach, and pt able to use approply.

## 2017-09-14 LAB — GLUCOSE, CAPILLARY: Glucose-Capillary: 102 mg/dL — ABNORMAL HIGH (ref 65–99)

## 2017-09-14 NOTE — Progress Notes (Signed)
Awake and used call bell. Pt talking about husband and is aware of the plan for her to go to [facility]. Pt allowed time to talk  And made comfortable in bed. Medicated with tessalon pearls for cough per mar. Call bell in reach.

## 2017-09-14 NOTE — Discharge Summary (Signed)
[place] at [place] NAME: Jodi Bullock    MR#:  [id number]  DATE OF BIRTH:  Jul 02, 1924  DATE OF ADMISSION:  09/09/2017 ADMITTING PHYSICIAN: Vaughan Basta, MD  DATE OF DISCHARGE: 09/14/2017  PRIMARY CARE PHYSICIAN: Tracie Harrier, MD    ADMISSION DIAGNOSIS:  Other closed fracture of right femur, unspecified portion of femur, initial encounter ([place]) [S72.8X1A]  DISCHARGE DIAGNOSIS:  Closed right femur fracture--NON operative management Acute encephalopathy suspected due to pain meds--resolved SECONDARY DIAGNOSIS:   Past Medical History:  Diagnosis Date  . Arthritis   . Bladder incontinence   . Breast cancer ([place]) 1998  . Chronic systolic CHF (congestive heart failure) ([place]) 01/10/2016   a. 09/2015 Echo: EF 40-45%, possible inf HK, mod PAH (PASP 71mmHg).  . Depression   . GERD (gastroesophageal reflux disease)   . History of kidney stones   . HLD (hyperlipidemia)   . Hypertension   . Neuropathy of both feet   . NICM (nonischemic cardiomyopathy) ([place])    a. 08/2015 Lexiscan MV: EF 39%, no ischemia; b. 09/2015 Echo: EF 40-45%.  . Right Humerus head fracture    a. 07/2016 - managed conservatively.  . Thymus cancer [facility])    cancer    HOSPITAL COURSE:  [name] a84 y.o.femalewith a known history of arthritis, bladder incontinence, breast cancer, chronic systolic CHF, depression, gastroesophageal reflux disease, hyperlipidemia, hypertension, thymus cancer- lives [facility] with her husband. Her husband is having weakness and cough with low-grade fever for last 2 days and had an episode of stumbling while going to the cafeteria. While trying to help him, she lost her balance and fell down and since then having pain in her right hip.  *acute encephalopathy -suspected due to pain meds---resolved  * right femur fracture s/p mechanical fall Orthopedic consult with Dr Kathryne Hitch  operative fracture. OK to start PT Pain management and physical therapy evaluation appreciated   * hypertension Continue home medications, currently elevated due to pain.  * hyperlipidemia Continue home medication.  * chronic systolic CHF Currently no signs of extubation, continue to monitor.  * D/[facility] planning to [facility]    CONSULTS OBTAINED:  Treatment Team:  Earnestine Leys, MD  DRUG ALLERGIES:   Allergies  Allergen Reactions  . 2,4-D Dimethylamine (Amisol) Other (See Comments)    Other Reaction: OTHER REACTION-IRRITATION TO Esophagous  . Celebrex [Celecoxib] Other (See Comments)     esophagitis Esophageal spasms    . Ciprofloxacin Other (See Comments)    Esophageal irritation  . Ibuprofen     Other reaction(s): Other (See Comments) reflux  . Metronidazole Other (See Comments)    neuropathy  . Naproxen Other (See Comments)    GI UPSET  . Other Other (See Comments)    Other Reaction: esophagitis    DISCHARGE MEDICATIONS:   Allergies as of 09/14/2017      Reactions   2,4-d Dimethylamine (amisol) Other (See Comments)   Other Reaction: OTHER REACTION-IRRITATION TO Esophagous   Celebrex [celecoxib] Other (See Comments)    esophagitis Esophageal spasms     Ciprofloxacin Other (See Comments)   Esophageal irritation   Ibuprofen    Other reaction(s): Other (See Comments) reflux   Metronidazole Other (See Comments)   neuropathy   Naproxen Other (See Comments)   GI UPSET   Other Other (See Comments)   Other Reaction: esophagitis      Medication List    TAKE these medications  benzonatate 200 MG capsule Commonly known as:  TESSALON Take 1 capsule (200 mg total) by mouth 3 (three) times daily as needed for cough.   Cholecalciferol 2000 units Tabs Take 1 tablet by mouth daily.   fluticasone 50 MCG/ACT nasal spray Commonly known as:  FLONASE Place 1 spray into both nostrils daily.   Fluticasone Furoate 100 MCG/ACT Aepb Commonly  known as:  ARNUITY ELLIPTA Inhale 1 puff into the lungs daily.   ipratropium 0.06 % nasal spray Commonly known as:  ATROVENT Place 2 sprays into the nose 4 (four) times daily.   loratadine 10 MG tablet Commonly known as:  CLARITIN Take 1 tablet (10 mg total) by mouth at bedtime.   LORazepam 0.5 MG tablet Commonly known as:  ATIVAN Take 1 tablet (0.5 mg total) by mouth 2 (two) times daily as needed for anxiety.   losartan 100 MG tablet Commonly known as:  COZAAR Take 1 tablet (100 mg total) by mouth daily.   lovastatin 40 MG tablet Commonly known as:  MEVACOR Take 1 tablet (40 mg total) by mouth daily. What changed:  additional instructions   metoprolol tartrate 50 MG tablet Commonly known as:  LOPRESSOR TAKE 1 TABLET(50 MG) BY MOUTH TWICE DAILY   polyethylene glycol powder powder Commonly known as:  GLYCOLAX/MIRALAX 17 grams a day   PRESERVISION/LUTEIN Caps Take 1 each by mouth 2 (two) times daily.       If you experience worsening of your admission symptoms, develop shortness of breath, life threatening emergency, suicidal or homicidal thoughts you must seek medical attention immediately by calling 911 or calling your MD immediately  if symptoms less severe.  You Must read complete instructions/literature along with all the possible adverse reactions/side effects for all the Medicines you take and that have been prescribed to you. Take any new Medicines after you have completely understood and accept all the possible adverse reactions/side effects.   Please note  You were cared for by a hospitalist during your hospital stay. If you have any questions about your discharge medications or the care you received while you were in the hospital after you are discharged, you can call the unit and asked to speak with the hospitalist on call if the hospitalist that took care of you is not available. Once you are discharged, your primary care physician will handle any further  medical issues. Please note that NO REFILLS for any discharge medications will be authorized once you are discharged, as it is imperative that you return to your primary care physician (or establish a relationship with a primary care physician if you do not have one) for your aftercare needs so that they can reassess your need for medications and monitor your lab values. Today   SUBJECTIVE   Doing ok  VITAL SIGNS:  Blood pressure (!) 150/56, pulse 81, temperature 98.3 F (36.8 C), resp. rate 18, height 5\' 2"  (1.575 m), weight 66.7 kg (147 lb), SpO2 94 %.  I/O:    Intake/Output Summary (Last 24 hours) at 09/14/2017 1235 Last data filed at 09/14/2017 0949 Gross per 24 hour  Intake 870 ml  Output 150 ml  Net 720 ml    PHYSICAL EXAMINATION:  GENERAL:  84 y.o.-year-old patient lying in the bed with no acute distress.  EYES: Pupils equal, round, reactive to light and accommodation. No scleral icterus. Extraocular muscles intact.  HEENT: Head atraumatic, normocephalic. Oropharynx and nasopharynx clear.  NECK:  Supple, no jugular venous distention. No thyroid enlargement, no tenderness.  LUNGS: Normal breath sounds bilaterally, no wheezing, rales,rhonchi or crepitation. No use of accessory muscles of respiration.  CARDIOVASCULAR: S1, S2 normal. No murmurs, rubs, or gallops.  ABDOMEN: Soft, non-tender, non-distended. Bowel sounds present. No organomegaly or mass.  EXTREMITIES: No pedal edema, cyanosis, or clubbing.  NEUROLOGIC: Cranial nerves II through XII are intact. Muscle strength 5/5 in all extremities. Sensation intact. Gait not checked.  PSYCHIATRIC: The patient is alert and oriented x 3.  SKIN: No obvious rash, lesion, or ulcer.   DATA REVIEW:   CBC  Recent Labs  Lab 09/13/17 0432  WBC 10.9  HGB 12.2  HCT 36.0  PLT 187    Chemistries  Recent Labs  Lab 09/09/17 1405 09/10/17 0319  NA 141 141  K 4.2 3.7  CL 105 106  CO2 27 28  GLUCOSE 115* 114*  BUN 15 16   CREATININE 0.69 0.63  CALCIUM 9.0 8.7*  AST 36  --   ALT 45  --   ALKPHOS 72  --   BILITOT 0.7  --     Microbiology Results   Recent Results (from the past 240 hour(s))  MRSA PCR Screening     Status: None   Collection Time: 09/09/17  5:44 PM  Result Value Ref Range Status   MRSA by PCR NEGATIVE NEGATIVE Final    Comment:        The GeneXpert MRSA Assay (FDA approved for NASAL specimens only), is one component of a comprehensive MRSA colonization surveillance program. It is not intended to diagnose MRSA infection nor to guide or monitor treatment for MRSA infections. Performed at [facility], [address], [place], [place] [address]     RADIOLOGY:  No results found.   Management plans discussed with the patient, family and they are in agreement.  CODE STATUS:     Code Status Orders  (From admission, onward)        Start     Ordered   09/09/17 1700  Limited resuscitation (code)  Continuous    Question Answer Comment  In the event of cardiac or respiratory ARREST: Initiate Code Blue, Call Rapid Response Yes   In the event of cardiac or respiratory ARREST: Perform CPR Yes   In the event of cardiac or respiratory ARREST: Perform Intubation/Mechanical Ventilation No   In the event of cardiac or respiratory ARREST: Use NIPPV/BiPAp only if indicated Yes   In the event of cardiac or respiratory ARREST: Administer ACLS medications if indicated Yes   In the event of cardiac or respiratory ARREST: Perform Defibrillation or Cardioversion if indicated Yes      09/09/17 1659    Code Status History    This patient has a current code status but no historical code status.    Advance Directive Documentation     Most Recent Value  Type of Advance Directive  Living will  Pre-existing out of facility DNR order (yellow form or pink MOST form)  -  "MOST" Form in Place?  -      TOTAL TIME TAKING CARE OF THIS PATIENT: **40* minutes.    Fritzi Mandes M.D on  09/14/2017 at 12:35 PM  Between 7am to 6pm - Pager - [phone] After 6pm go to [email or web address] - password [facility]  Office  [phone]  CC: Primary care physician; Tracie Harrier, MD

## 2017-09-14 NOTE — Progress Notes (Signed)
Patient is medically stable for D/C to [facility] today. Per [facility] admissions coordinator at [facility] patient can come today to room 403. RN will call report and arrange EMS for transport. Clinical Education officer, museum (CSW) sent D/C orders to [facility] via [place]. Patient is aware of above. CSW contacted patient's son Marya Amsler and made him aware of above. Please reconsult if future social work needs arise. CSW signing off.   [employer], LCSW [phone]

## 2017-09-14 NOTE — Clinical Social Work Placement (Signed)
   CLINICAL SOCIAL WORK PLACEMENT  NOTE  Date:  09/14/2017  Patient Details  Name: Jodi Bullock MRN: [id number] Date of Birth: 09-12-1924  Clinical Social Work is seeking post-discharge placement for this patient at the [place] level of care (*CSW will initial, date and re-position this form in  chart as items are completed):  Yes   Patient/family provided with [place] Work Department's list of facilities offering this level of care within the geographic area requested by the patient (or if unable, by the patient's family).  Yes   Patient/family informed of their freedom to choose among providers that offer the needed level of care, that participate in Medicare, Medicaid or managed care program needed by the patient, have an available bed and are willing to accept the patient.  Yes   Patient/family informed of [facility]'s ownership interest in [facility] and [facility], as well as of the fact that they are under no obligation to receive care at these facilities.  PASRR submitted to EDS on 09/11/17     PASRR number received on 09/11/17     Existing PASRR number confirmed on       FL2 transmitted to all facilities in geographic area requested by pt/family on 09/11/17     FL2 transmitted to all facilities within larger geographic area on       Patient informed that his/her managed care company has contracts with or will negotiate with certain facilities, including the following:        Yes   Patient/family informed of bed offers received.  Patient chooses bed at [facility] )     Physician recommends and patient chooses bed at      Patient to be transferred to [facility] ) on 09/14/17.  Patient to be transferred to facility by [facility] )     Patient family notified on 09/14/17 of transfer.  Name of family member notified:  (Patient's son Marya Amsler is aware of D/C today. )     PHYSICIAN       Additional  Comment:    _______________________________________________ Kilyn Maragh, Veronia Beets, LCSW 09/14/2017, 1:36 PM

## 2017-09-14 NOTE — Progress Notes (Signed)
Physical Therapy Treatment Patient Details Name: Jodi Bullock MRN: [id number] DOB: 04-09-1925 Today's Date: 09/14/2017    History of Present Illness Pt is a 84 y.o. female s/p injured her R hip and R knee sustained during a fall when trying to catch her husband as he fell. Conservative management recommended (non-op) for a fracture of the anterior cortex of trochanteric region on the right hip.  PMH includes bladder incontinence, breast CA, htn, neuropathy B feet, R humerus head fx 08/09/16, chronic L1 and L3 compression fx's, h/o colles fx/femur fx/radius/ulna fx, CHF.    PT Comments    Pt appearing anxious this morning d/t wanting to discharge to rehab so she could see her husband; therapist listened to pt's concerns, provided encouragement, and pt appeared more relaxed as session continued. Pt ambulated about 10 feet with RW but limited distance d/t fatigue (pt also reporting 8/10 R hip pain with activity but decreased to 0/10 at end of session resting in chair).  Will continue to progress pt with strengthening and progressive functional mobility per pt tolerance.   Follow Up Recommendations  SNF     Equipment Recommendations  Rolling walker with 5" wheels;Wheelchair (measurements PT);Wheelchair cushion (measurements PT)    Recommendations for Other Services OT consult     Precautions / Restrictions Precautions Precautions: Fall Restrictions Weight Bearing Restrictions: Yes RLE Weight Bearing: Weight bearing as tolerated Other Position/Activity Restrictions: WBAT with walker    Mobility  Bed Mobility Overal bed mobility: Needs Assistance Bed Mobility: Supine to Sit     Supine to sit: Min assist;HOB elevated     General bed mobility comments: Assist for R LE; use of bed rail; increased time to perform  Transfers Overall transfer level: Needs assistance Equipment used: Rolling walker (2 wheeled) Transfers: Sit to/from Stand Sit to Stand: Min assist;Mod assist         General transfer comment: assist for UE and LE placement; vc's and assist to initiate and come to full stand; pt requiring cueing for technique for transfer  Ambulation/Gait Ambulation/Gait assistance: Min assist;+2 safety/equipment(chair follow for safety) Ambulation Distance (Feet): 10 Feet Assistive device: Rolling walker (2 wheeled) Gait Pattern/deviations: Step-to pattern Gait velocity: decreased   General Gait Details: pt requiring intermittent cueing for technique and to increase UE support through RW to minimize weight through R LE (to decrease R hip pain as much as possible); R knee flexing during R LE stance phase and decreased stance time noted R LE   Stairs             Wheelchair Mobility    Modified Rankin (Stroke Patients Only)       Balance Overall balance assessment: Needs assistance Sitting-balance support: Bilateral upper extremity supported;Feet supported Sitting balance-Leahy Scale: Poor Sitting balance - Comments: requires UE support for static sitting balance   Standing balance support: Bilateral upper extremity supported Standing balance-Leahy Scale: Poor Standing balance comment: requires B UE support on RW for standing balance                            Cognition Arousal/Alertness: Awake/alert Behavior During Therapy: Anxious Overall Cognitive Status: Within Functional Limits for tasks assessed                                        Exercises      General Comments General comments (skin  integrity, edema, etc.): Purewick removed for sessions activities (nursing tech notified end of session to replace).  Nursing cleared pt for participation in physical therapy.  Pt agreeable to PT session.      Pertinent Vitals/Pain      Home Living                      Prior Function            PT Goals (current goals can now be found in the care plan section) Acute Rehab PT Goals Patient Stated Goal: to  improve mobility PT Goal Formulation: With patient Time For Goal Achievement: 09/24/17 Potential to Achieve Goals: Good Progress towards PT goals: Progressing toward goals    Frequency    7X/week      PT Plan Current plan remains appropriate    Co-evaluation              AM-PAC PT "6 Clicks" Daily Activity  Outcome Measure  Difficulty turning over in bed (including adjusting bedclothes, sheets and blankets)?: Unable Difficulty moving from lying on back to sitting on the side of the bed? : Unable Difficulty sitting down on and standing up from a chair with arms (e.g., wheelchair, bedside commode, etc,.)?: Unable Help needed moving to and from a bed to chair (including a wheelchair)?: A Lot Help needed walking in hospital room?: A Little Help needed climbing 3-5 steps with a railing? : Total 6 Click Score: 9    End of Session Equipment Utilized During Treatment: Gait belt Activity Tolerance: Patient limited by pain Patient left: in chair;with call bell/phone within reach;with chair alarm set;Other (comment)(B heels elevated via pillow) Nurse Communication: Mobility status;Precautions;Weight bearing status PT Visit Diagnosis: Other abnormalities of gait and mobility (R26.89);Muscle weakness (generalized) (M62.81);History of falling (Z91.81);Difficulty in walking, not elsewhere classified (R26.2);Pain Pain - Right/Left: Right Pain - part of body: Hip     Time: 1047-1110 PT Time Calculation (min) (ACUTE ONLY): 23 min  Charges:  $Gait Training: 8-22 mins $Therapeutic Activity: 8-22 mins                    G CodesLeitha Bleak, PT 09/14/17, 4:28 PM [phone]

## 2017-09-14 NOTE — Progress Notes (Signed)
Appears to be sleeping on rounds. resp easy. Call bell in reach. 

## 2017-09-14 NOTE — Progress Notes (Signed)
EMS called for transport. Report called to Claiborne Billings, Therapist, sports.

## 2017-09-14 NOTE — Progress Notes (Signed)
Slept in long intervals after tessalon pearls given. resp easy on room air. No appaarent distress. Call bell in reach.

## 2017-09-14 NOTE — Care Management Important Message (Signed)
Important Message  Patient Details  Name: Jodi Bullock MRN: [id number] Date of Birth: [birth year]   Medicare Important Message Given:  Yes    [name] A Chanti Golubski 09/14/2017, 10:33 AM

## 2017-09-20 ENCOUNTER — Ambulatory Visit: Payer: Medicare Other | Admitting: Nurse Practitioner

## 2017-09-28 ENCOUNTER — Encounter: Payer: Self-pay | Admitting: Nurse Practitioner

## 2017-10-10 ENCOUNTER — Ambulatory Visit: Payer: Medicare Other | Admitting: Nurse Practitioner

## 2017-10-26 ENCOUNTER — Ambulatory Visit
Admission: RE | Admit: 2017-10-26 | Discharge: 2017-10-26 | Disposition: A | Discharge status: Home / Self Care | Payer: Medicare Other | Source: Ambulatory Visit | Attending: Orthopedic Surgery | Admitting: Orthopedic Surgery

## 2017-10-26 ENCOUNTER — Other Ambulatory Visit: Payer: Self-pay | Admitting: Orthopedic Surgery

## 2017-10-26 DIAGNOSIS — M79604 Pain in right leg: Secondary | ICD-10-CM

## 2017-10-26 DIAGNOSIS — M9701XA Periprosthetic fracture around internal prosthetic right hip joint, initial encounter: Secondary | ICD-10-CM | POA: Insufficient documentation

## 2017-10-26 DIAGNOSIS — M7989 Other specified soft tissue disorders: Secondary | ICD-10-CM | POA: Diagnosis not present

## 2017-10-31 ENCOUNTER — Ambulatory Visit: Payer: Medicare Other | Admitting: Nurse Practitioner

## 2017-11-01 ENCOUNTER — Encounter: Payer: Self-pay | Admitting: Nurse Practitioner

## 2017-11-01 ENCOUNTER — Telehealth: Payer: Self-pay | Admitting: Cardiovascular Disease

## 2017-11-01 NOTE — Telephone Encounter (Signed)
Patient came to office today for yesterdays appt at 2 pm with [facility].    Patient asked if she could be seen by someone today .  Unable to see Today per PA in office but can add onto schedule tomorrow.    Patient visibly and audibly distraught stating several times " I just cant do this , this is ya'lls  fault, ya'll cant keep things straight here"   Patient declined to be seen tomorrow due to transportation difficulty .  Patient declined for staff to call transportation for a return pick up stating "I will do it myself"   Patient declined to reschedule at this time and is not sure that she will because " why should I ya'll cant keep ya'lls things straight"   Patient notified to call when ready to reschedule and left office.    Called son Marya Amsler to discuss concerns and he states the office did nothing wrong and the patient is still adjusting to her snf move ( it is a big adjustment) .  Son heard the voicemail reminder from our office for 6/19 2 pm appt and left it on the patients vm for review if wanted.    Rescheduled with Ryan for 6/21 at 130 pm.  Son will call to discuss with patient and call office back to confirm.

## 2017-11-02 ENCOUNTER — Ambulatory Visit: Payer: Medicare Other | Admitting: Physician Assistant

## 2017-11-02 NOTE — Telephone Encounter (Signed)
To Chris/ Ryan  Any work in spots next week (6/24-6/28)?

## 2017-11-02 NOTE — Telephone Encounter (Signed)
Vicki Mallet from [facility] that patient is ready to reschedule appointment  Offered next available but declined due to wanting to be seen sooner Please call Vicki Mallet of [facility] directly at [phone] to discuss

## 2017-11-04 IMAGING — CR DG KNEE COMPLETE 4+V*R*
1 series · 4 of 4 positions shown · non-contrast
Comparison: No recent prior.

CLINICAL DATA: Knee replacement.

EXAM:
RIGHT KNEE - COMPLETE 4+ VIEW

[Series 1: dg knee complete 4 views right · 0.14mm/px · 4 of 4 slices shown]
[im 1/4]
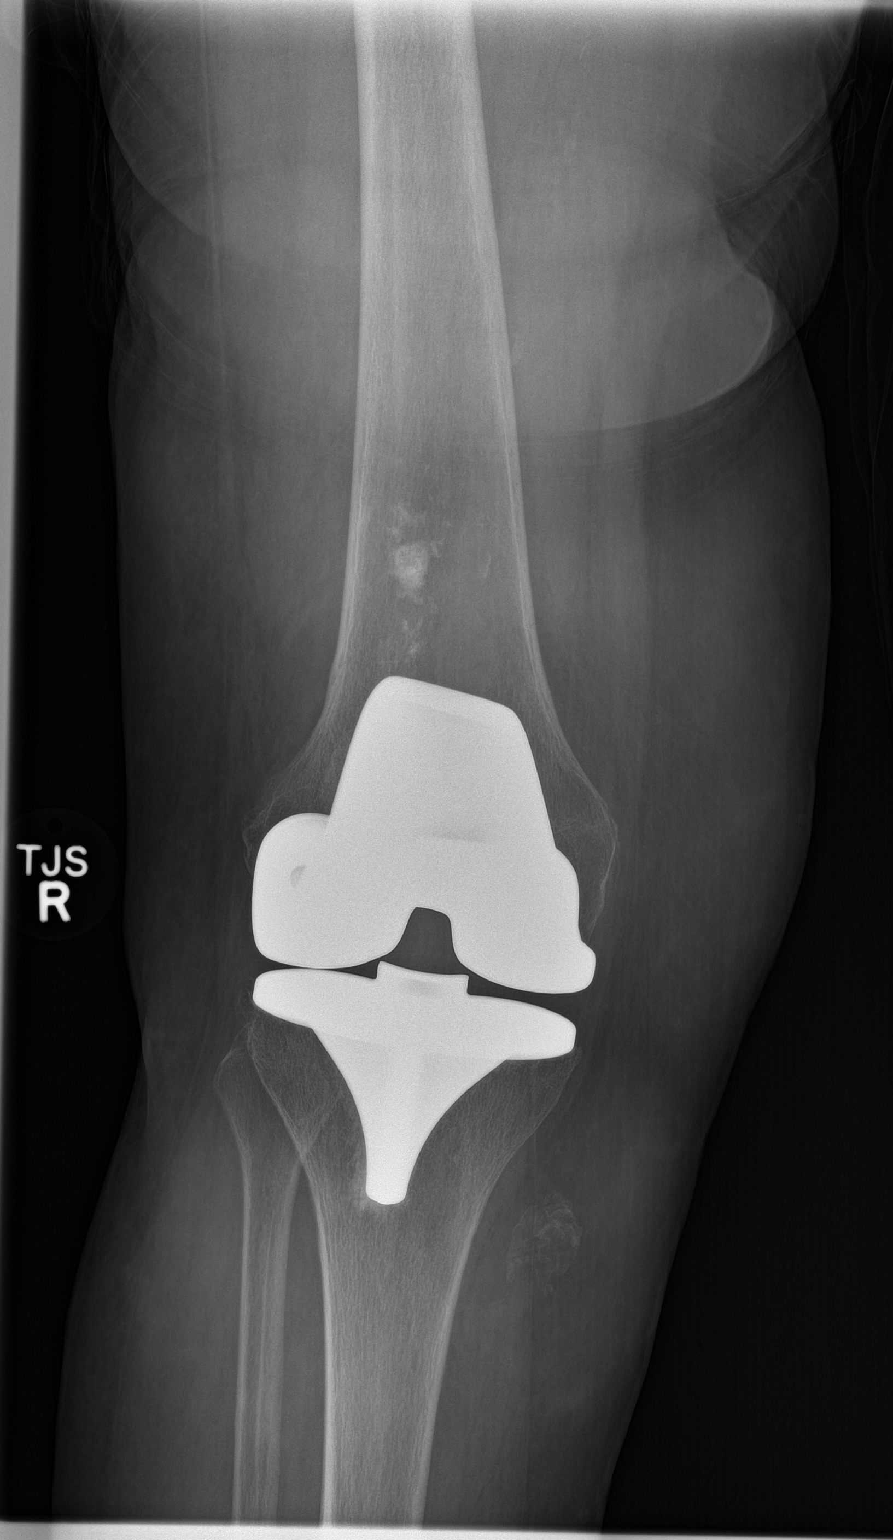
[im 2/4]
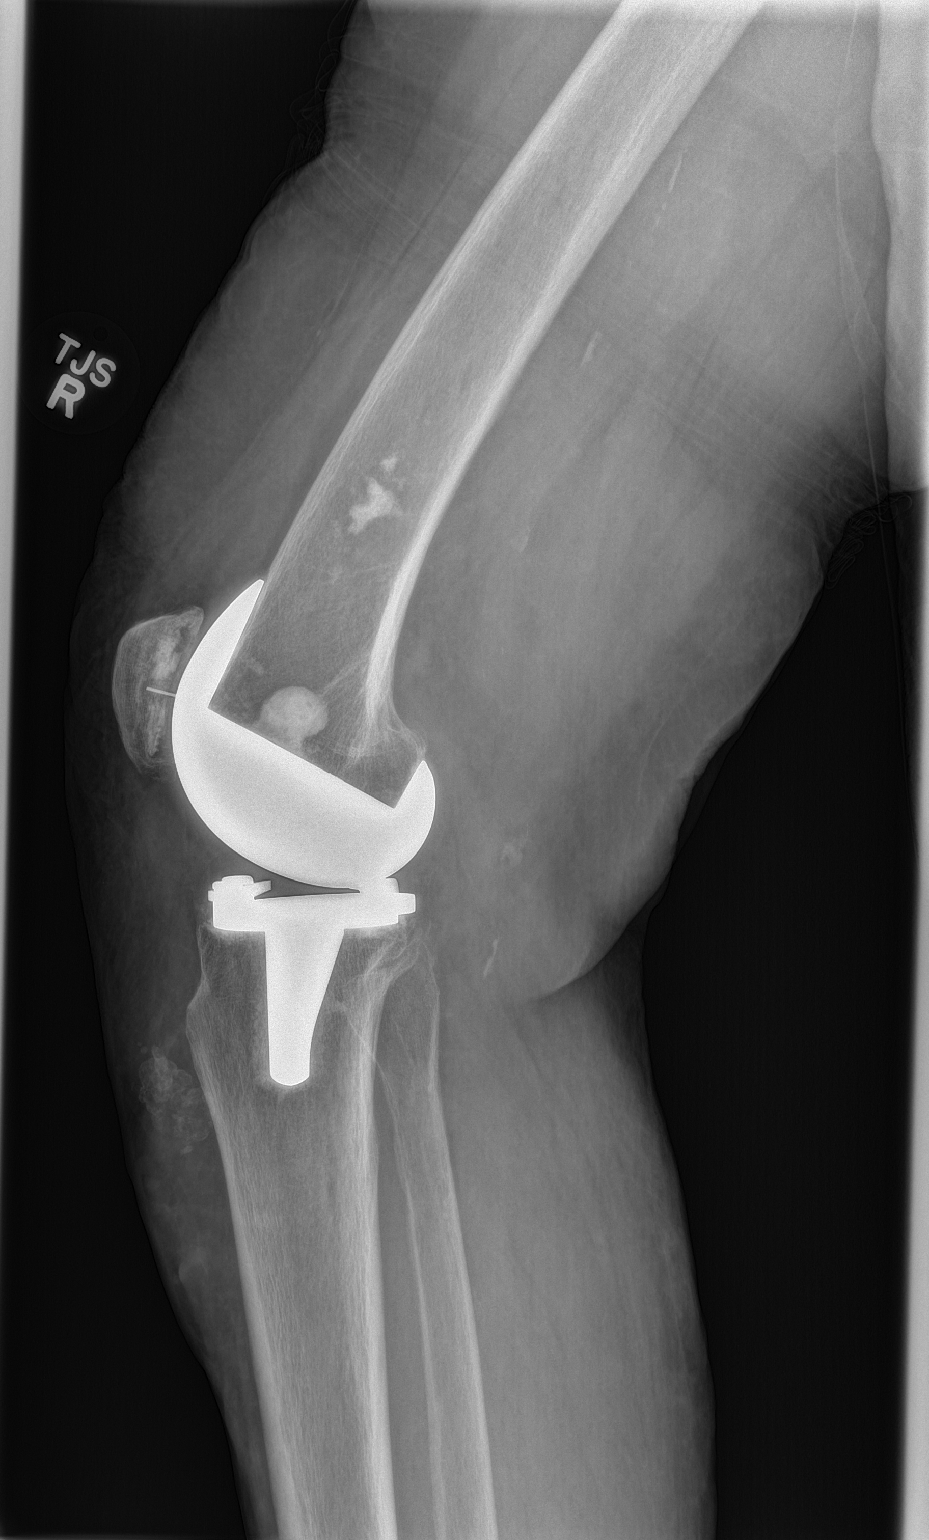
[im 3/4]
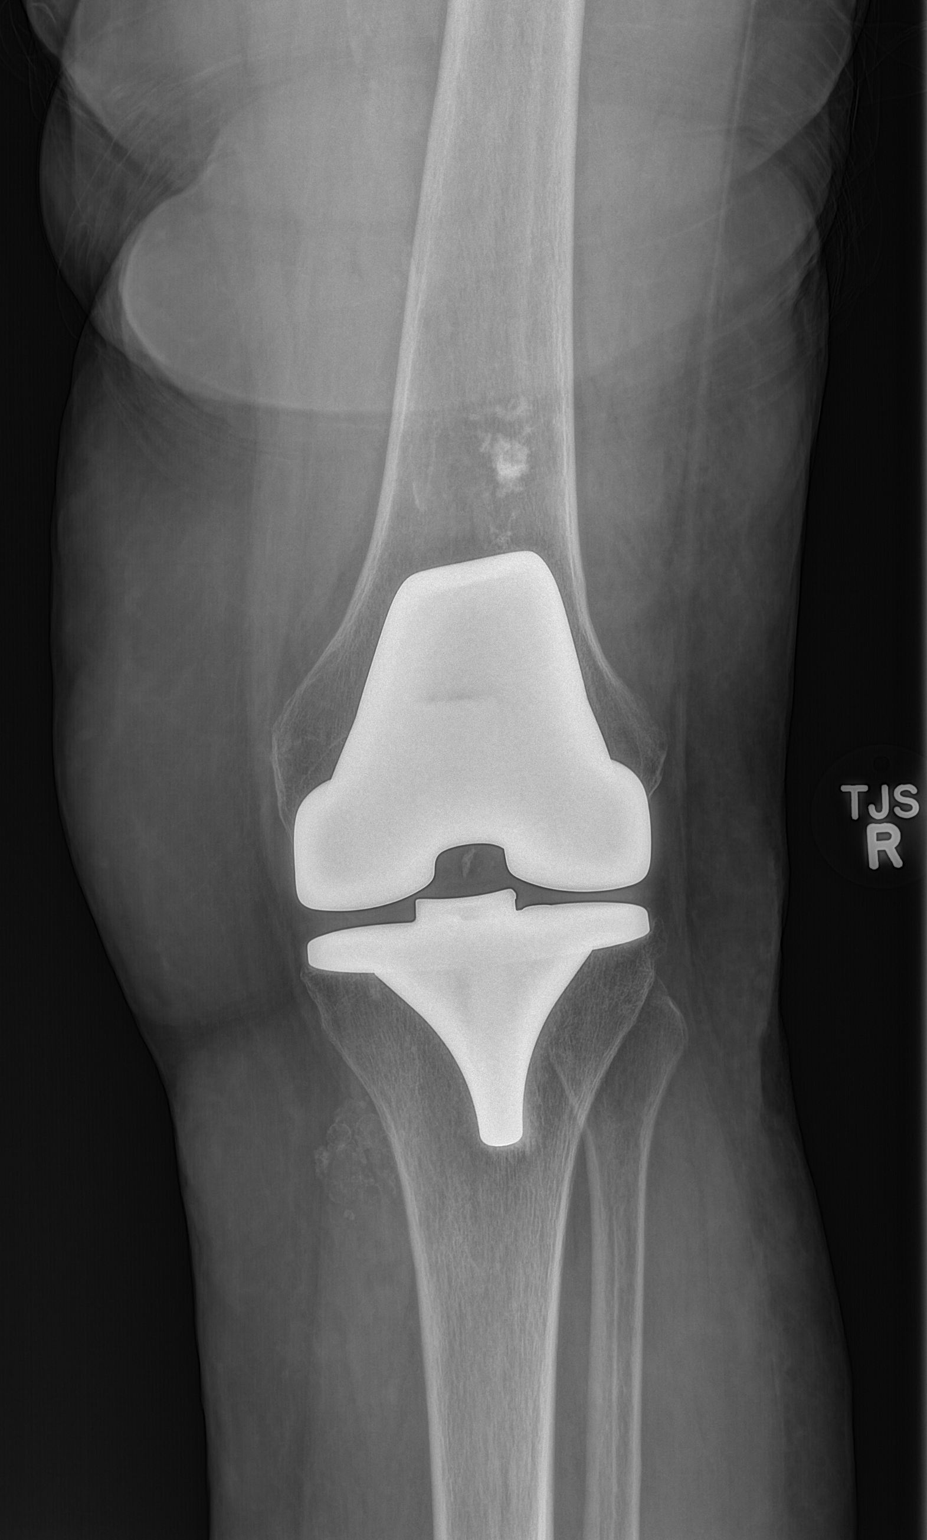
[im 4/4]
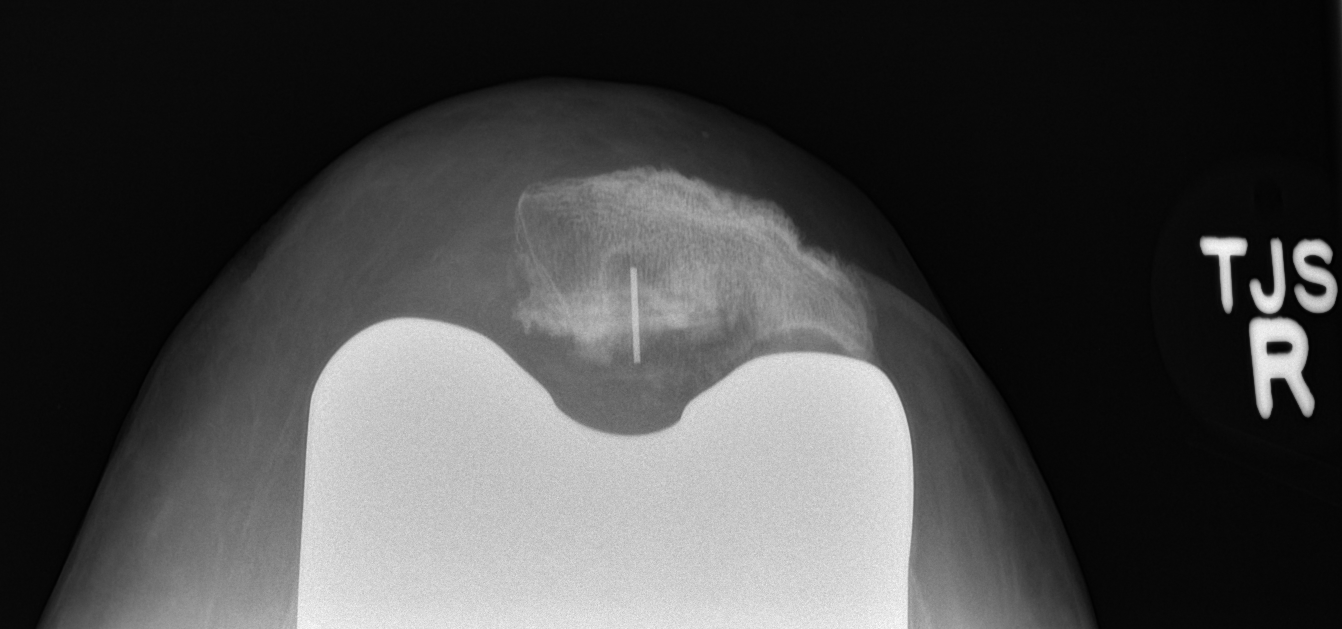

[4 of 4 positions shown; findings below may reference images not displayed]

FINDINGS: Total knee replacement. Hardware intact. Good anatomic alignment.
Diffuse osteopenia. Sclerotic focus in the distal femur most likely
related to old infarct. No acute bony abnormality. Rounded soft
tissue calcific density is noted along the anterior medial aspect of
the tibia. This may represent dystrophic calcification. Peripheral
vascular calcification .
IMPRESSION: Total knee replacement. Hardware intact. Good anatomic alignment. No
acute bony abnormality identified.

2. Prominent soft tissue calcific density noted along the anterior
medial aspect of the tibia, possibly dystrophic calcification.

3.  Peripheral vascular disease .

## 2017-11-06 NOTE — Telephone Encounter (Signed)
It looks like I should have a 10:30 on the 27th.

## 2017-11-07 NOTE — Telephone Encounter (Signed)
Pt called back and confirmed appt for 6/27

## 2017-11-07 NOTE — Telephone Encounter (Signed)
Routing to scheduling to schedule.

## 2017-11-07 NOTE — Telephone Encounter (Signed)
Spoke to patient and she is willing to come see [name] 11/08/17 at 10:30am  lmov for Jodi Bullock to call back and confirm new appointment time for patient

## 2017-11-08 ENCOUNTER — Ambulatory Visit (INDEPENDENT_AMBULATORY_CARE_PROVIDER_SITE_OTHER): Payer: Medicare Other | Admitting: Nurse Practitioner

## 2017-11-08 ENCOUNTER — Encounter: Payer: Self-pay | Admitting: Nurse Practitioner

## 2017-11-08 VITALS — BP 158/68 | HR 64 | Ht 66.0 in | Wt 166.2 lb

## 2017-11-08 DIAGNOSIS — I5022 Chronic systolic (congestive) heart failure: Secondary | ICD-10-CM

## 2017-11-08 DIAGNOSIS — I10 Essential (primary) hypertension: Secondary | ICD-10-CM

## 2017-11-08 DIAGNOSIS — E782 Mixed hyperlipidemia: Secondary | ICD-10-CM | POA: Diagnosis not present

## 2017-11-08 DIAGNOSIS — I428 Other cardiomyopathies: Secondary | ICD-10-CM

## 2017-11-08 MED ORDER — ASPIRIN EC 81 MG PO TBEC: 81.0000 mg | DELAYED_RELEASE_TABLET | Freq: Every day | ORAL | 3 refills | Status: DC

## 2017-11-08 MED ORDER — POTASSIUM CHLORIDE ER 10 MEQ PO TBCR: 10.0000 meq | EXTENDED_RELEASE_TABLET | Freq: Every day | ORAL | 3 refills | Status: DC

## 2017-11-08 MED ORDER — FUROSEMIDE 20 MG PO TABS: 20.0000 mg | ORAL_TABLET | Freq: Every day | ORAL | 3 refills | Status: DC

## 2017-11-08 MED ORDER — CARVEDILOL 12.5 MG PO TABS: 12.5000 mg | ORAL_TABLET | Freq: Two times a day (BID) | ORAL | 3 refills | Status: DC

## 2017-11-08 MED ORDER — CARVEDILOL 6.25 MG PO TABS: 6.2500 mg | ORAL_TABLET | Freq: Two times a day (BID) | ORAL | 3 refills | Status: DC

## 2017-11-08 NOTE — Progress Notes (Signed)
Called patient to review updated medication dosage for carvedilol which should be 12.5 mg twice a day. She states that she is waiting on the person who comes to assist with her medications tomorrow. Instructed her to have them call me with any questions. She verbalized understanding.  Called pharmacy and they state patient has not picked up her medications yet and would make the change. Spoke with Zoe at pharmacy and advised that new prescription was sent in for Carvedilol 12.5 mg twice a day. She verbalized understanding.

## 2017-11-08 NOTE — Addendum Note (Signed)
Addended by: Valora Corporal on: 11/08/2017 02:04 PM   Modules accepted: Orders

## 2017-11-08 NOTE — Patient Instructions (Addendum)
Medication Instructions:  Your physician has recommended you make the following change in your medication:  1. STOP taking Metoprolol 2. STOP taking Hydrochlorothiazide 3. START Furosemide 20 mg once daily 4. START Potassium 10 meq once daily 5. START Carvedilol 12.5 mg twice a day 6. DECREASE Aspirin to 81 mg once daily   Labwork: We would like for you to come back in one week to have BMET at the [place] and check in at the front desk.   Follow-Up: Your physician recommends that you schedule a follow-up appointment in: 1 month with Dr. Fletcher Anon.   It was a pleasure seeing you today here in the office. Please do not hesitate to give [name] a call back if you have any further questions. [place], BSN

## 2017-11-08 NOTE — Progress Notes (Signed)
Office Visit    Patient Name: Jodi Bullock Date of Encounter: 11/08/2017  Primary Care Provider:  Tracie Harrier, MD Primary Cardiologist:  Kathlyn [place], MD  Chief Complaint    84 year old female with a history of nonischemic cardiomyopathy, HFmEF (EF 40-45%), hypertension, hyperlipidemia, thymus cancer, remote breast cancer, and recent fracture of the right femur, who presents for follow-up.  Past Medical History    Past Medical History:  Diagnosis Date  . Arthritis   . Bladder incontinence   . Breast cancer ([place]) 1998  . Chronic systolic CHF (congestive heart failure) ([place]) 01/10/2016   a. 09/2015 Echo: EF 40-45%, possible inf HK, mod PAH (PASP 70mmHg).  . Closed fracture of right femur ([place])    a. 08/2017->Managed conservatively.  . Depression   . GERD (gastroesophageal reflux disease)   . History of kidney stones   . HLD (hyperlipidemia)   . Hypertension   . Neuropathy of both feet   . NICM (nonischemic cardiomyopathy) ([place])    a. 08/2015 Lexiscan MV: EF 39%, no ischemia; b. 09/2015 Echo: EF 40-45%.  . Right Humerus head fracture    a. 07/2016 - managed conservatively.  . Thymus cancer [facility])    cancer   Past Surgical History:  Procedure Laterality Date  . ABDOMINAL HYSTERECTOMY    . BREAST LUMPECTOMY    . CATARACT EXTRACTION    . CHOLECYSTECTOMY    . JOINT REPLACEMENT Right    Hip  . KNEE SURGERY Right     Allergies  Allergies  Allergen Reactions  . 2,4-D Dimethylamine (Amisol) Other (See Comments)    Other Reaction: OTHER REACTION-IRRITATION TO Esophagous  . Celebrex [Celecoxib] Other (See Comments)     esophagitis Esophageal spasms    . Ciprofloxacin Other (See Comments)    Esophageal irritation  . Ibuprofen     Other reaction(s): Other (See Comments) reflux  . Metronidazole Other (See Comments)    neuropathy  . Naproxen Other (See Comments)    GI UPSET  . Other Other (See Comments)    Other Reaction: esophagitis    History of Present  Illness    84 year old female with the above complex past medical history including hypertension, hyperlipidemia, GERD, nonischemic cardiomyopathy, HFmEF ([facility] 40-45%), thymus cancer, and remote breast cancer.  Cardiac history dates back to April 2017, when she was evaluated with exertional dyspnea.  Stress testing showed an EF of 39% without ischemia.  Follow-up echocardiography showed an EF of 40 to 45% with possible inferior hypokinesis.  Medical therapy was initiated including beta-blocker and ARB.  She was on Lasix but has had poor tolerance of this in the setting of bladder incontinence.  She was last seen in clinic in June 2018, at which time she was doing well.  She did well from a cardiac standpoint following that visit but in April, was admitted after losing her balance and falling resulting in a right femur fracture.  She was seen by orthopedics with recommendation for conservative therapy.  She was later discharged to skilled nursing on May 3.  She is now staying at [facility] and has recovered well from her hip fracture.  She has noted that her blood pressures been running higher.  She is also been having an increase in right lower extremity swelling.  She does have chronic swelling in that leg but has been worse recently.  She was placed on hydrochlorothiazide but she does not think it is working very well.  She was previously on Lasix for  this and feels as though that did a better job.  She was previously advised to wear compression hose but does not.  She is careful with salt intake and says the food provided there generally is not seasoned at all.  She has some degree of chronic dyspnea on exertion which is relatively stable.  She has not had any chest pain and denies palpitations, PND, orthopnea, dizziness, syncope, or early satiety.  Home Medications    Prior to Admission medications   Medication Sig Start Date End Date Taking? Authorizing Provider  benzonatate (TESSALON) 200 MG  capsule Take 1 capsule (200 mg total) by mouth 3 (three) times daily as needed for cough. 09/07/17   Laverle Hobby, MD  Cholecalciferol 2000 UNITS TABS Take 1 tablet by mouth daily.    Roselee Nova, MD  fluticasone [facility]) 50 MCG/ACT nasal spray Place 1 spray into both nostrils daily. 08/30/17   Wilhelmina Mcardle, MD  Fluticasone Furoate (ARNUITY ELLIPTA) 100 MCG/ACT AEPB Inhale 1 puff into the lungs daily. 08/30/17   Wilhelmina Mcardle, MD  ipratropium (ATROVENT) 0.06 % nasal spray Place 2 sprays into the nose 4 (four) times daily. 07/04/17 09/09/17  Laverle Hobby, MD  loratadine (CLARITIN) 10 MG tablet Take 1 tablet (10 mg total) by mouth at bedtime. 08/30/17 08/30/18  Wilhelmina Mcardle, MD  LORazepam (ATIVAN) 0.5 MG tablet Take 1 tablet (0.5 mg total) by mouth 2 (two) times daily as needed for anxiety. 05/23/17   Roselee Nova, MD  losartan (COZAAR) 100 MG tablet Take 1 tablet (100 mg total) by mouth daily. 05/23/17   Roselee Nova, MD  lovastatin (MEVACOR) 40 MG tablet Take 1 tablet (40 mg total) by mouth daily. Patient taking differently: Take 40 mg by mouth daily. Pt takes 3 times weekly 05/23/17   Roselee Nova, MD  metoprolol tartrate (LOPRESSOR) 50 MG tablet TAKE 1 TABLET(50 MG) BY MOUTH TWICE DAILY 04/09/17   Roselee Nova, MD  Multiple Vitamins-Minerals (PRESERVISION/LUTEIN) CAPS Take 1 each by mouth 2 (two) times daily.    [provider]  polyethylene glycol powder (GLYCOLAX/MIRALAX) powder 17 grams a day 10/22/09   [provider]    Review of Systems    As above, stable/chronic dyspnea on exertion.  Increase in chronic right lower extremity swelling.  Blood pressures also running higher.  She denies palpitations, PND, orthopnea, dizziness, syncope, chest pain, or early satiety..  All other systems reviewed and are otherwise negative except as noted above.  Physical Exam    VS:  BP (!) 158/68 (BP Location: Left Arm, Patient Position: Sitting,  Cuff Size: Normal)   Pulse 64   Ht 5\' 6"  (1.676 m)   Wt 166 lb 4 oz (75.4 kg)   BMI 26.83 kg/m  , BMI Body mass index is 26.83 kg/m. GEN: Well nourished, well developed, in no acute distress.  HEENT: normal.  Neck: Supple, no JVD, carotid bruits, or masses. Cardiac: RRR, no murmurs, rubs, or gallops. No clubbing, cyanosis, 1-2+ right lower extremity swelling to the knee.   Radials/DP/PT 2+ and equal bilaterally.  Respiratory:  Respirations regular and unlabored, clear to auscultation bilaterally. GI: Soft, nontender, nondistended, BS + x 4. MS: no deformity or atrophy. Skin: warm and dry, no rash. Neuro:  Strength and sensation are intact. Psych: Normal affect.  Accessory Clinical Findings    ECG -regular sinus rhythm, 64, first-degree AV block, no acute changes.  Assessment & Plan  1.  Chronic systolic congestive heart failure/nonischemic cardiomyopathy: Weight has been stable.  She is chronic stable dyspnea on exertion and an increase in chronic right lower extremity swelling.  This is persisted despite the addition of HCTZ.  Blood pressure also running higher.  I am going to discontinue HCTZ and add Lasix 20 mg daily.  Previous labs reviewed.  I will add potassium chloride 10 mill equivalents daily and plan on follow-up basic metabolic panel in 1 week.  To address her blood pressure, I am also going to switch her metoprolol to carvedilol 12.5 mg twice daily.  2.  Essential hypertension: Blood pressure has been elevated since recent hospitalization.  We discussed potentially adding an additional medication however, we mutually agreed that switching metoprolol to carvedilol for better blood pressure impact may be a better first to start.  Also changing HCTZ to Lasix for improved diuresis.  Continue ARB.  3.  Hyperlipidemia: Continue statin therapy.  LDL 70 in May 2018.  4.  Chronic right lower extremity swelling: See #1.  Change HCTZ to Lasix.  Encouraged her to keep her leg elevated  when not walking and also to wear support stockings.  5.  Disposition: Follow-up basic metabolic panel in 1 week.  Follow-up in clinic in 1 month.   Murray Hodgkins, NP 11/08/2017, 12:45 PM

## 2017-11-12 ENCOUNTER — Telehealth: Payer: Self-pay | Admitting: Cardiovascular Disease

## 2017-11-12 NOTE — Telephone Encounter (Signed)
Patient calling Would like to speak with Pam Please call to discuss

## 2017-11-12 NOTE — Telephone Encounter (Signed)
Left voicemail message to call back  

## 2017-11-15 ENCOUNTER — Other Ambulatory Visit
Admission: RE | Admit: 2017-11-15 | Discharge: 2017-11-15 | Disposition: A | Discharge status: Home / Self Care | Payer: Medicare Other | Source: Ambulatory Visit | Attending: Nurse Practitioner | Admitting: Nurse Practitioner

## 2017-11-15 DIAGNOSIS — I5022 Chronic systolic (congestive) heart failure: Secondary | ICD-10-CM | POA: Diagnosis present

## 2017-11-15 LAB — BASIC METABOLIC PANEL
Anion gap: 7 (ref 5–15)
BUN: 18 mg/dL (ref 8–23)
CO2: 31 mmol/L (ref 22–32)
CREATININE: 0.78 mg/dL (ref 0.44–1.00)
Calcium: 9.1 mg/dL (ref 8.9–10.3)
Chloride: 104 mmol/L (ref 98–111)
GFR calc Af Amer: >60 mL/min (ref >60)
Glucose, Bld: 99 mg/dL (ref 70–99)
Potassium: 4.2 mmol/L (ref 3.5–5.1)
SODIUM: 142 mmol/L (ref 135–145)

## 2017-11-20 NOTE — Telephone Encounter (Signed)
Left voicemail message for her to call back.  

## 2017-11-30 ENCOUNTER — Ambulatory Visit: Payer: Medicare Other | Admitting: Cardiovascular Disease

## 2017-11-30 ENCOUNTER — Ambulatory Visit (INDEPENDENT_AMBULATORY_CARE_PROVIDER_SITE_OTHER): Payer: Medicare Other | Admitting: Cardiovascular Disease

## 2017-11-30 ENCOUNTER — Encounter: Payer: Self-pay | Admitting: Cardiovascular Disease

## 2017-11-30 VITALS — BP 158/74 | HR 62 | Ht 66.0 in | Wt 166.5 lb

## 2017-11-30 DIAGNOSIS — I10 Essential (primary) hypertension: Secondary | ICD-10-CM

## 2017-11-30 DIAGNOSIS — I5022 Chronic systolic (congestive) heart failure: Secondary | ICD-10-CM

## 2017-11-30 DIAGNOSIS — E785 Hyperlipidemia, unspecified: Secondary | ICD-10-CM

## 2017-11-30 DIAGNOSIS — I493 Ventricular premature depolarization: Secondary | ICD-10-CM | POA: Diagnosis not present

## 2017-11-30 NOTE — Progress Notes (Signed)
Cardiology Office Note   Date:  11/30/2017   ID:  Jodi Bullock, DOB 1925/02/15, MRN [id number]  PCP:  Tracie Harrier, MD  Cardiologist:   Kathlyn Sacramento, MD   Chief Complaint  Patient presents with  . OTHER    1 month f/u discuss labs pt mentioned never called and irregular BP. Meds reviewed verbally with pt.      History of Present Illness: Jodi Bullock is a 84 y.o. female who is here today for a follow-up visit regarding chronic systolic heart failure thought to be due to nonischemic cardiomyopathy with no previous cardiac catheterization given age and improvement in symptoms with medical therapy. She has multiple chronic medical conditions that include hypertension, hyperlipidemia, premature beats, thymus cancer status post resection and esophageal spasm. She was evaluated in April 2017 for exertional dyspnea. A pharmacologic nuclear stress test showed no evidence of perfusion defects. Ejection fraction was calculated to be 39% . Echocardiogram showed mildly reduced LV systolic function with an ejection fraction of 40-45% with possible inferior wall hypokinesis, elevated filling pressures and mild to moderate pulmonary hypertension with systolic pulmonary pressure 46 mmHg.    She fell in April and fractured her right femur.  She was treated conservatively and did not require surgery.  She is now staying at [facility] with her husband.  She was seen by Ignacia Bayley last month for worsening leg edema and uncontrolled blood pressure.  She was switched from metoprolol to carvedilol and from hydrochlorothiazide to furosemide 20 mg once daily.  I reviewed home blood pressure readings and the majority of them are below 921 systolic.  She reports improvement in edema although she continues to have significant swelling in the right side.  She had ankle problems on that side.   Past Medical History:  Diagnosis Date  . Arthritis   . Bladder incontinence   . Breast cancer ([place]) 1998    . Chronic systolic CHF (congestive heart failure) ([place]) 01/10/2016   a. 09/2015 Echo: EF 40-45%, possible inf HK, mod PAH (PASP 35mmHg).  . Closed fracture of right femur ([place])    a. 08/2017->Managed conservatively.  . Depression   . GERD (gastroesophageal reflux disease)   . History of kidney stones   . HLD (hyperlipidemia)   . Hypertension   . Neuropathy of both feet   . NICM (nonischemic cardiomyopathy) ([place])    a. 08/2015 Lexiscan MV: EF 39%, no ischemia; b. 09/2015 Echo: EF 40-45%.  . Right Humerus head fracture    a. 07/2016 - managed conservatively.  . Thymus cancer [facility])    cancer    Past Surgical History:  Procedure Laterality Date  . ABDOMINAL HYSTERECTOMY    . BREAST LUMPECTOMY    . CATARACT EXTRACTION    . CHOLECYSTECTOMY    . JOINT REPLACEMENT Right    Hip  . KNEE SURGERY Right      Current Outpatient Medications  Medication Sig Dispense Refill  . acetaminophen (TYLENOL) 650 MG CR tablet Take 650 mg by mouth 2 (two) times daily.    Marland Kitchen aspirin EC 81 MG tablet Take 1 tablet (81 mg total) by mouth daily. 90 tablet 3  . benzonatate (TESSALON) 200 MG capsule Take 1 capsule (200 mg total) by mouth 3 (three) times daily as needed for cough. 30 capsule 1  . carvedilol (COREG) 12.5 MG tablet Take 1 tablet (12.5 mg total) by mouth 2 (two) times daily. 180 tablet 3  . Cholecalciferol 2000 UNITS TABS Take 1 tablet  by mouth daily.    . cyanocobalamin 1000 MCG tablet Take 1,000 mcg by mouth daily.    Marland Kitchen docusate sodium (COLACE) 100 MG capsule Take 100 mg by mouth daily.    . fluticasone (FLONASE) 50 MCG/ACT nasal spray Place 1 spray into both nostrils daily. 16 g 2  . Fluticasone Furoate (ARNUITY ELLIPTA) 100 MCG/ACT AEPB Inhale 1 puff into the lungs daily. 30 each 10  . furosemide (LASIX) 20 MG tablet Take 1 tablet (20 mg total) by mouth daily. 90 tablet 3  . loperamide (IMODIUM A-D) 2 MG tablet Take 2 mg by mouth as needed for diarrhea or loose stools.    Marland Kitchen loratadine  (CLARITIN) 10 MG tablet Take 1 tablet (10 mg total) by mouth at bedtime. 30 tablet 5  . LORazepam (ATIVAN) 0.5 MG tablet Take 1 tablet (0.5 mg total) by mouth 2 (two) times daily as needed for anxiety. 60 tablet 2  . losartan (COZAAR) 100 MG tablet Take 1 tablet (100 mg total) by mouth daily. 30 tablet 2  . lovastatin (MEVACOR) 40 MG tablet Take 1 tablet (40 mg total) by mouth daily. (Patient taking differently: Take 40 mg by mouth daily. Pt takes 3 times weekly) 90 tablet 0  . Multiple Vitamins-Minerals (PRESERVISION/LUTEIN) CAPS Take 1 each by mouth 2 (two) times daily.    Marland Kitchen omeprazole (PRILOSEC) 40 MG capsule Take 40 mg by mouth daily.    . polyethylene glycol powder (GLYCOLAX/MIRALAX) powder 17 grams a day    . potassium chloride (K-DUR) 10 MEQ tablet Take 1 tablet (10 mEq total) by mouth daily. 90 tablet 3  . traMADol (ULTRAM) 50 MG tablet Take by mouth.     No current facility-administered medications for this visit.     Allergies:   2,4-d dimethylamine (amisol); Celebrex [celecoxib]; Ciprofloxacin; Ibuprofen; Metronidazole; Naproxen; and Other    Social History:  The patient  reports that she has quit smoking. She has never used smokeless tobacco. She reports that she does not drink alcohol or use drugs.   Family History:  The patient's family history includes Diabetes in her father; Heart disease in her father; Stroke in her mother.    ROS:  Please see the history of present illness.   Otherwise, review of systems are positive for none.   All other systems are reviewed and negative.    PHYSICAL EXAM: VS:  BP (!) 158/74 (BP Location: Left Arm, Patient Position: Sitting, Cuff Size: Normal)   Pulse 62   Ht 5\' 6"  (1.676 m)   Wt 166 lb 8 oz (75.5 kg)   BMI 26.87 kg/m  , BMI Body mass index is 26.87 kg/m. GEN: Well nourished, well developed, in no acute distress  HEENT: normal  Neck: no JVD, carotid bruits, or masses Cardiac: RRR; no murmurs, rubs, or gallops, mild bilateral  edema worse on the right side Respiratory:  clear to auscultation bilaterally, normal work of breathing GI: soft, nontender, nondistended, + BS MS: no deformity or atrophy  Skin: warm and dry, no rash Neuro:  Strength and sensation are intact Psych: euthymic mood, full affect   EKG:  EKG is not ordered today.   Recent Labs: 09/09/2017: ALT 45 09/13/2017: Hemoglobin 12.2; Platelets 187 11/15/2017: BUN 18; Creatinine, Ser 0.78; Potassium 4.2; Sodium 142    Lipid Panel    Component Value Date/Time   CHOL 139 05/24/2017 1049   CHOL 131 06/04/2015 0810   TRIG 163 (H) 05/24/2017 1049   HDL 48 (L) 05/24/2017 1049  HDL 54 06/04/2015 0810   CHOLHDL 2.9 05/24/2017 1049   VLDL 29 10/11/2016 0804   LDLCALC 67 05/24/2017 1049      Wt Readings from Last 3 Encounters:  11/30/17 166 lb 8 oz (75.5 kg)  11/08/17 166 lb 4 oz (75.4 kg)  09/14/17 147 lb (66.7 kg)        ASSESSMENT AND PLAN:  1.  Chronic systolic heart failure: Ejection fraction was 40-45% with mild to moderate pulmonary hypertension.  She is mildly volume overloaded but overall she seems to be better.  Continue same dose furosemide 20 mg once daily.  Continue treatment with carvedilol and losartan.  2. PVCs: Seems to be well controlled with beta-blocker therapy.  3.  Essential hypertension: Blood pressure is elevated today but home readings appear to be mostly within the normal range.  4.  Hyperlipidemia: Currently on lovastatin with most recent LDL of 70.   Disposition:   FU in 3 months  Signed,  Kathlyn Sacramento, MD  11/30/2017 4:05 PM    [place]

## 2017-11-30 NOTE — Patient Instructions (Signed)
Medication Instructions: Your physician recommends that you continue on your current medications as directed. Please refer to the Current Medication list given to you today.  If you need a refill on your cardiac medications before your next appointment, please call your pharmacy.   Follow-Up: Your physician wants you to follow-up in 3 months with Murray Hodgkins, NP.   Thank you for choosing [facility] at [facility]!

## 2018-01-16 ENCOUNTER — Ambulatory Visit (INDEPENDENT_AMBULATORY_CARE_PROVIDER_SITE_OTHER): Payer: Medicare Other | Admitting: Pulmonary Disease

## 2018-01-16 ENCOUNTER — Encounter: Payer: Self-pay | Admitting: Pulmonary Disease

## 2018-01-16 VITALS — BP 148/88 | HR 66 | Ht 66.0 in | Wt 164.0 lb

## 2018-01-16 DIAGNOSIS — R053 Chronic cough: Secondary | ICD-10-CM

## 2018-01-16 DIAGNOSIS — J329 Chronic sinusitis, unspecified: Secondary | ICD-10-CM

## 2018-01-16 DIAGNOSIS — R05 Cough: Secondary | ICD-10-CM

## 2018-01-16 NOTE — Patient Instructions (Signed)
Continue Arnuity inhaler daily.  Rinse mouth after use Over-the-counter nasal saline spray to both nostrils twice a day Change Flonase to 1 spray per nostril twice a day.  Use after saline spray Follow-up in 3 months or sooner as needed

## 2018-01-18 NOTE — Progress Notes (Signed)
PULMONARY OFFICE FOLLOW UP NOTE  Requesting MD/Service: Manuella Ghazi, MD Date of initial consultation: 09/25/16 Reason for consultation: Cough  PT PROFILE: 84 y.o. female never smoker referred for evaluation and mgmt of chronic cough. Wheezing noted on exam. Initial plan: Continue PPI and nasal steroid. Add Breo inhaler  INTERVAL: Last seen 08/30/2017.  No major pulmonary events since that time.  However, in the interim, she and her husband have moved to an assisted living facility.  SUBJ:  This is a scheduled follow-up.  Since last visit, her cough is much improved, essentially resolved.  However, she notes that her "head is all stopped up" and has been so all summer.  She has had a couple episodes of epistaxis, none recently.  She has no new respiratory complaints.  However, she expresses that she is not very happy with her new living situation.  Also, she expresses concern regarding her husband.  She denies CP, fever, purulent sputum, hemoptysis, LE edema and calf tenderness.   Vitals:   01/16/18 0950 01/16/18 0955  BP:  (!) 148/88  Pulse:  66  SpO2:  96%  Weight: 164 lb (74.4 kg)   Height: 5\' 6"  (1.676 m)   RA   EXAM:  Gen: NAD HEENT: NCAT, sclera white Neck: No JVD Lungs: breath sounds full, no wheezes or other adventitious sounds Cardiovascular: RRR, no murmurs Abdomen: Soft, nontender, normal BS Ext: without clubbing, cyanosis, edema Neuro: grossly intact Skin: Limited exam, no lesions noted    DATA:   BMP Latest Ref Rng & Units 11/15/2017 09/10/2017 09/09/2017  Glucose 70 - 99 mg/dL 99 114(H) 115(H)  BUN 8 - 23 mg/dL 18 16 15   Creatinine 0.44 - 1.00 mg/dL 0.78 0.63 0.69  BUN/Creat Ratio 12 - 28 - - -  Sodium 135 - 145 mmol/L 142 141 141  Potassium 3.5 - 5.1 mmol/L 4.2 3.7 4.2  Chloride 98 - 111 mmol/L 104 106 105  CO2 22 - 32 mmol/L 31 28 27   Calcium 8.9 - 10.3 mg/dL 9.1 8.7(L) 9.0    CBC Latest Ref Rng & Units 09/13/2017 09/10/2017 09/09/2017  WBC 3.6 - 11.0 K/uL 10.9 7.1  13.5(H)  Hemoglobin 12.0 - 16.0 g/dL 12.2 13.0 14.1  Hematocrit 35.0 - 47.0 % 36.0 38.3 43.4  Platelets 150 - 440 K/uL 187 190 218    CXR: No new film  IMPRESSION:    Chronic cough, essentially resolved. Chronic rhinosinusitis Chronic bronchitis  PLAN:  Continue Arnuity inhaler daily.  Rinse mouth after use Recommended over-the-counter nasal saline spray to both nostrils twice a day Change Flonase to 1 spray per nostril twice a day.  Use after saline spray Follow-up in 3 months or sooner as needed   Merton Border, MD PCCM service Mobile [phone] Pager [phone] 01/18/2018 9:48 AM   ADD: After this encounter, I contacted the patient's daughter to express my concern that the patient seems very depressed to me.  I suggested to the daughter that she seek counseling services through the assisted living facility.  Merton Border, MD PCCM service Mobile [phone] Pager [phone] 01/18/2018 9:51 AM

## 2018-02-11 ENCOUNTER — Other Ambulatory Visit: Payer: Self-pay

## 2018-02-11 ENCOUNTER — Emergency Department: Payer: Medicare Other

## 2018-02-11 ENCOUNTER — Emergency Department
Admission: EM | Admit: 2018-02-11 | Discharge: 2018-02-11 | Disposition: A | Discharge status: Home / Self Care | Payer: Medicare Other | Attending: Emergency Medicine | Admitting: Emergency Medicine

## 2018-02-11 ENCOUNTER — Encounter: Payer: Self-pay | Admitting: Emergency Medicine

## 2018-02-11 DIAGNOSIS — S0990XA Unspecified injury of head, initial encounter: Secondary | ICD-10-CM | POA: Diagnosis not present

## 2018-02-11 DIAGNOSIS — I5022 Chronic systolic (congestive) heart failure: Secondary | ICD-10-CM | POA: Diagnosis not present

## 2018-02-11 DIAGNOSIS — Y9389 Activity, other specified: Secondary | ICD-10-CM | POA: Insufficient documentation

## 2018-02-11 DIAGNOSIS — E785 Hyperlipidemia, unspecified: Secondary | ICD-10-CM | POA: Insufficient documentation

## 2018-02-11 DIAGNOSIS — Y998 Other external cause status: Secondary | ICD-10-CM | POA: Diagnosis not present

## 2018-02-11 DIAGNOSIS — S01411A Laceration without foreign body of right cheek and temporomandibular area, initial encounter: Secondary | ICD-10-CM | POA: Diagnosis present

## 2018-02-11 DIAGNOSIS — S0081XA Abrasion of other part of head, initial encounter: Secondary | ICD-10-CM

## 2018-02-11 DIAGNOSIS — I110 Hypertensive heart disease with heart failure: Secondary | ICD-10-CM | POA: Insufficient documentation

## 2018-02-11 DIAGNOSIS — W010XXA Fall on same level from slipping, tripping and stumbling without subsequent striking against object, initial encounter: Secondary | ICD-10-CM | POA: Diagnosis not present

## 2018-02-11 DIAGNOSIS — Z87891 Personal history of nicotine dependence: Secondary | ICD-10-CM | POA: Diagnosis not present

## 2018-02-11 DIAGNOSIS — Z79899 Other long term (current) drug therapy: Secondary | ICD-10-CM | POA: Diagnosis not present

## 2018-02-11 DIAGNOSIS — S51012A Laceration without foreign body of left elbow, initial encounter: Secondary | ICD-10-CM | POA: Insufficient documentation

## 2018-02-11 DIAGNOSIS — W19XXXA Unspecified fall, initial encounter: Secondary | ICD-10-CM

## 2018-02-11 DIAGNOSIS — Z853 Personal history of malignant neoplasm of breast: Secondary | ICD-10-CM | POA: Insufficient documentation

## 2018-02-11 DIAGNOSIS — Y92009 Unspecified place in unspecified non-institutional (private) residence as the place of occurrence of the external cause: Secondary | ICD-10-CM | POA: Insufficient documentation

## 2018-02-11 DIAGNOSIS — S51019A Laceration without foreign body of unspecified elbow, initial encounter: Secondary | ICD-10-CM

## 2018-02-11 MED ORDER — ACETAMINOPHEN 325 MG PO TABS
650.0000 mg | ORAL_TABLET | Freq: Once | ORAL | Status: AC
  Administered 2018-02-11: 650 mg via ORAL
  Filled 2018-02-11: qty 2

## 2018-02-11 NOTE — ED Triage Notes (Signed)
States was turning to switch light off and got foot stuck and fell to floor. Glasses abraded below and above L eye. Denies LOC. Denies headache. Denies use of blood thinners.

## 2018-02-11 NOTE — Discharge Instructions (Addendum)
Your exam and CT scan are negative following your fall. Apply ice to reduce swelling and bruising. Avoid lotions, creams, or ointments to the glued wounds. Follow-up with your provider for ongoing symptoms.

## 2018-02-13 NOTE — ED Provider Notes (Signed)
[facility] Emergency Department Provider Note ____________________________________________  Time seen: 1445  I have reviewed the triage vital signs and the nursing notes.  HISTORY  Chief Complaint  Abrasion  HPI Jodi Bullock is a 84 y.o. female presents to the ED accompanied by family, from her assisted living facility, who reports a mechanical fall.  Patient describes she was trying to switch off a light as she was leaving the home with her husband, she describes her foot got caught behind the other foot, and she fell forward to the floor.  She describes hitting her face but denies any loss of consciousness.  She also denies any headache, visual disturbance, nausea, vomiting, or dizziness.  Her primary complaint is a superficial abrasion below the left eye on the cheek as well as just under the brow.  She believes these abrasions were caused by her glasses.  Patient denies any other injury at this time. She has a history of HTN, breast cancer, and bilateral foot neuropathy.   Past Medical History:  Diagnosis Date  . Arthritis   . Bladder incontinence   . Breast cancer ([place]) 1998  . Chronic systolic CHF (congestive heart failure) ([place]) 01/10/2016   a. 09/2015 Echo: EF 40-45%, possible inf HK, mod PAH (PASP 83mmHg).  . Closed fracture of right femur ([place])    a. 08/2017->Managed conservatively.  . Depression   . GERD (gastroesophageal reflux disease)   . History of kidney stones   . HLD (hyperlipidemia)   . Hypertension   . Neuropathy of both feet   . NICM (nonischemic cardiomyopathy) ([place])    a. 08/2015 Lexiscan MV: EF 39%, no ischemia; b. 09/2015 Echo: EF 40-45%.  . Right Humerus head fracture    a. 07/2016 - managed conservatively.  . 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 Femur fracture ([place]) 09/09/2017  . Abnormal gait 12/19/2016  . Acquired spondylolisthesis 12/19/2016  . Breast lump 12/19/2016  .  Cervical spondylosis without myelopathy 12/19/2016  . Closed Colles' fracture 12/19/2016  . Closed fracture of base of neck of femur ([place]) 12/19/2016  . Closed fracture of lower end of radius and ulna 12/19/2016  . Closed fracture of neck of femur ([place]) 12/19/2016  . Spondylolisthesis, congenital 12/19/2016  . Contracture of joint of hand 12/19/2016  . Contracture of wrist joint 12/19/2016  . Contusion of hand 12/19/2016  . Contusion of hip 12/19/2016  . Contusion of knee 12/19/2016  . Degeneration of intervertebral disc at C4-C5 level 12/19/2016  . Degeneration of lumbar intervertebral disc 12/19/2016  . Disorder of coccyx 12/19/2016  . Dry eyes 12/19/2016  . Enthesopathy 12/19/2016  . Enthesopathy of hip region 12/19/2016  . Hand joint pain 12/19/2016  . Hand joint stiff 12/19/2016  . Hip pain 12/19/2016  . Joint pain 12/19/2016  . Knee pain 12/19/2016  . Localized, primary osteoarthritis 12/19/2016  . Loose body in hip joint 12/19/2016  . Lumbar sprain 12/19/2016  . Abnormal mammogram 12/19/2016  . Meibomian gland dysfunction 12/19/2016  . Muscle weakness 12/19/2016  . Open fracture of lower end of forearm 12/19/2016  . Presbyopia 12/19/2016  . Regular astigmatism 12/19/2016  . Shoulder joint pain 12/19/2016  . Skin sensation disturbance 12/19/2016  . Sprain of ankle 12/19/2016  . Trochanteric bursitis 12/19/2016  . Wrist joint pain 12/19/2016  . Stiffness of wrist joint 12/19/2016  . Acute bilateral low back pain without sciatica 09/29/2016  .  Acute bronchitis 06/19/2016  . Recurrent productive cough 06/14/2016  . Medicare annual wellness visit, subsequent 05/17/2016  . Fall in elderly patient 01/27/2016  . Sprain of wrist, right 01/26/2016  . Systolic CHF with reduced left ventricular function, NYHA class 3 ([place]) 01/10/2016  . Productive cough 11/19/2015  . Chronic systolic heart failure ([place]) 10/12/2015  . Dyspnea on exertion 08/31/2015  . Fatigue 08/31/2015  .  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 Irregular heart rhythm 07/22/2015  . Left arm pain 06/29/2015  . Dyslipidemia 06/03/2015  . Acute recurrent maxillary sinusitis 06/03/2015  . Oral mucosal lesion 05/26/2015  . Urge incontinence 02/14/2015  . Ankle edema 02/02/2015  . At risk for falling 01/25/2015  . Dizziness 01/25/2015  . Acid reflux 01/25/2015  . Big thyroid 01/25/2015  . Gravida 2 para 2 01/25/2015  . Personal history of malignant neoplasm of breast 01/25/2015  . Arthritis, degenerative 01/25/2015  . Parity 2 01/25/2015  . Peripheral blood vessel disorder ([place]) 01/25/2015  . Need for vaccination 01/25/2015  . Pain in rectum 01/25/2015  . Reflux 01/25/2015  . Screening for depression 01/25/2015  . Disease of accessory sinus 01/25/2015  . Headache, temporal 01/25/2015  . Primary cancer of thymus ([place]) 01/25/2015  . Disease of thyroid gland 01/25/2015  . Avitaminosis D 01/25/2015  . Family history of diabetes mellitus in father 12/31/2014  . Fasting hyperglycemia 12/31/2014  . Hypertension 12/01/2014  . HLD (hyperlipidemia) 12/01/2014  . Anxiety 12/01/2014  . Myofascial pain 12/01/2014  . Breast CA ([place]) 11/24/2014  . Absence of bladder continence 11/24/2014  . Urgency of micturation 11/24/2014  . Common bile duct dilatation 10/20/2014  . Pancreatic cyst 10/20/2014  . Kidney stones 10/20/2014  . Neuritis or radiculitis due to rupture of lumbar intervertebral disc 06/15/2014  . Lumbar canal stenosis 06/15/2014  . Degenerative arthritis of lumbar spine 06/15/2014  . Lumbar radiculitis 06/15/2014  . Osteoarthritis of spine with radiculopathy, lumbar region 06/15/2014  . 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 Bloodgood disease 11/05/2012  . Arthritis of temporomandibular joint 06/24/2012  . Atypical chest pain 01/05/2012  . Breath shortness 12/04/2011  . Lung mass 11/15/2011  . Thyroid nodule 11/15/2011  . Age-related macular degeneration, dry 10/03/2011  .  Disorder of peripheral nervous system 10/03/2011  . Chronic rhinitis 03/03/2011  . Carotid artery narrowing 03/01/2011  . Polypharmacy 12/02/2010    Past Surgical History:  Procedure Laterality Date  . ABDOMINAL HYSTERECTOMY    . BREAST LUMPECTOMY    . CATARACT EXTRACTION    . CHOLECYSTECTOMY    . JOINT REPLACEMENT Right    Hip  . KNEE SURGERY Right     Prior to Admission medications   Medication Sig Start Date End Date Taking? Authorizing Provider  acetaminophen (TYLENOL) 650 MG CR tablet Take 650 mg by mouth 2 (two) times daily.    [provider]  aspirin EC 81 MG tablet Take 1 tablet (81 mg total) by mouth daily. 11/08/17   Theora Gianotti, NP  benzonatate (TESSALON) 200 MG capsule Take 1 capsule (200 mg total) by mouth 3 (three) times daily as needed for cough. 09/07/17   Laverle Hobby, MD  carvedilol (COREG) 12.5 MG tablet Take 1 tablet (12.5 mg total) by mouth 2 (two) times daily. 11/08/17 02/06/18  Theora Gianotti, NP  Cholecalciferol 2000 UNITS TABS Take 1 tablet by mouth daily.    Roselee Nova, MD  cyanocobalamin 1000 MCG tablet Take 1,000 mcg by mouth daily.  [provider]  docusate sodium (COLACE) 100 MG capsule Take 100 mg by mouth daily.    [provider]  fluticasone (FLONASE) 50 MCG/ACT nasal spray Place 1 spray into both nostrils daily. 08/30/17   Wilhelmina Mcardle, MD  Fluticasone Furoate (ARNUITY ELLIPTA) 100 MCG/ACT AEPB Inhale 1 puff into the lungs daily. 08/30/17   Wilhelmina Mcardle, MD  furosemide (LASIX) 20 MG tablet Take 1 tablet (20 mg total) by mouth daily. 11/08/17 02/06/18  Theora Gianotti, NP  loperamide (IMODIUM A-D) 2 MG tablet Take 2 mg by mouth as needed for diarrhea or loose stools.    [provider]  loratadine (CLARITIN) 10 MG tablet Take 1 tablet (10 mg total) by mouth at bedtime. 08/30/17 08/30/18  Wilhelmina Mcardle, MD  LORazepam (ATIVAN) 0.5 MG tablet Take 1 tablet (0.5 mg  total) by mouth 2 (two) times daily as needed for anxiety. 05/23/17   Roselee Nova, MD  losartan (COZAAR) 100 MG tablet Take 1 tablet (100 mg total) by mouth daily. 05/23/17   Roselee Nova, MD  lovastatin (MEVACOR) 40 MG tablet Take 1 tablet (40 mg total) by mouth daily. Patient taking differently: Take 40 mg by mouth daily. Pt takes 3 times weekly 05/23/17   Roselee Nova, MD  Multiple Vitamins-Minerals (PRESERVISION/LUTEIN) CAPS Take 1 each by mouth 2 (two) times daily.    [provider]  omeprazole (PRILOSEC) 40 MG capsule Take 40 mg by mouth daily.    [provider]  polyethylene glycol powder (GLYCOLAX/MIRALAX) powder 17 grams a day 10/22/09   [provider]  potassium chloride (K-DUR) 10 MEQ tablet Take 1 tablet (10 mEq total) by mouth daily. 11/08/17   Theora Gianotti, NP  traMADol (ULTRAM) 50 MG tablet Take by mouth.    [provider]    Allergies 2,4-d dimethylamine (amisol); Celebrex [celecoxib]; Ciprofloxacin; Ibuprofen; Metronidazole; Naproxen; and Other  Family History  Problem Relation Age of Onset  . Stroke Mother   . Diabetes Father   . Heart disease Father   . Kidney disease Neg Hx   . Bladder Cancer Neg Hx   . Kidney cancer Neg Hx     Social History Social History   Tobacco Use  . Smoking status: Former Research scientist (life sciences)  . Smokeless tobacco: Never Used  . Tobacco comment: smoked in high school for a few years only  Substance Use Topics  . Alcohol use: No    Alcohol/week: 0.0 standard drinks  . Drug use: No    Review of Systems  Constitutional: Negative for fever. Eyes: Negative for visual changes. Facial abrasions as above.  ENT: Negative for nosebleed Cardiovascular: Negative for chest pain. Respiratory: Negative for shortness of breath. Gastrointestinal: Negative for abdominal pain, vomiting and diarrhea. Genitourinary: Negative for dysuria. Musculoskeletal: Negative for back pain. Skin: Negative for  rash. Neurological: Negative for headaches, focal weakness or numbness. ____________________________________________  PHYSICAL EXAM:  VITAL SIGNS: ED Triage Vitals  Enc Vitals Group     BP 02/11/18 1240 (!) 169/70     Pulse Rate 02/11/18 1240 64     Resp 02/11/18 1559 18     Temp 02/11/18 1240 (!) 97.5 F (36.4 C)     Temp Source 02/11/18 1240 Oral     SpO2 02/11/18 1240 98 %     Weight 02/11/18 1245 164 lb (74.4 kg)     Height 02/11/18 1245 5\' 6"  (1.676 m)     Head Circumference --  Peak Flow --      Pain Score 02/11/18 1245 0     Pain Loc --      Pain Edu? --      Excl. in [place]? --     Constitutional: Alert and oriented. Well appearing and in no distress. Head: Normocephalic and atraumatic. Eyes: Conjunctivae are normal. PERRL. Normal extraocular movements and fundi bilaterally. She has a superficial linear abrasion to the right zygomatic cheek and lateral inferior brow.  Ears: Canals clear. TMs intact bilaterally. Nose: No congestion/rhinorrhea/epistaxis. Mouth/Throat: Mucous membranes are moist. Neck: Supple. Normal ROM. No midline tenderness.  Cardiovascular: Normal rate, regular rhythm. Normal distal pulses. Respiratory: Normal respiratory effort. No wheezes/rales/rhonchi. Gastrointestinal: Soft and nontender. No distention. Musculoskeletal: Nontender with normal range of motion in all extremities.  Neurologic: Normal speech and language. No gross focal neurologic deficits are appreciated. Skin:  Skin is warm, dry and intact. No rash noted. ____________________________________________   RADIOLOGY  Head CT w/o CM  IMPRESSION: 1. Left forehead scalp soft tissue injury without underlying fracture. 2. Stable and negative for age noncontrast CT appearance of the brain. ____________________________________________  PROCEDURES Acetaminophen 650 mg PO  .Marland KitchenLaceration Repair Date/Time: 02/13/2018 7:04 PM Performed by: Melvenia Needles, PA-C Authorized by:  Melvenia Needles, PA-C   Consent:    Consent obtained:  Verbal   Consent given by:  Patient   Risks discussed:  Poor wound healing and poor cosmetic result   Alternatives discussed:  No treatment Anesthesia (see MAR for exact dosages):    Anesthesia method:  None Laceration details:    Location:  Face   Face location:  R cheek (& brow)   Length (cm):  1 Repair type:    Repair type:  Simple Pre-procedure details:    Preparation:  Patient was prepped and draped in usual sterile fashion Treatment:    Area cleansed with:  Saline   Amount of cleaning:  Standard   Irrigation solution:  Sterile saline   Irrigation method:  Syringe Skin repair:    Repair method:  Tissue adhesive Approximation:    Approximation:  Close Post-procedure details:    Dressing:  Open (no dressing)   Patient tolerance of procedure:  Tolerated well, no immediate complications  ___________________________________________  INITIAL IMPRESSION / ASSESSMENT AND PLAN / ED COURSE  Geriatric patient with ED evaluation of injury sustained following a mechanical fall.  Patient's exam is overall benign and reassuring at this time.  Her CT scan is also reassuring as it shows no acute intracranial process.  Patient superficial wounds to the right cheek and brow as well as the left elbow are treated with wound adhesive.  Patient is discharged to the care of her family with wound care instructions.  She will follow with primary provider for ongoing symptoms. ____________________________________________  FINAL CLINICAL IMPRESSION(S) / ED DIAGNOSES  Final diagnoses:  Fall at home, initial encounter  Facial abrasion, initial encounter  Skin tear of elbow without complication, initial encounter      Melvenia Needles, PA-C 02/13/18 1905    Nena Polio, MD 02/15/18 [phone]

## 2018-02-22 ENCOUNTER — Ambulatory Visit (INDEPENDENT_AMBULATORY_CARE_PROVIDER_SITE_OTHER): Payer: Medicare Other | Admitting: Nurse Practitioner

## 2018-02-22 ENCOUNTER — Encounter: Payer: Self-pay | Admitting: Nurse Practitioner

## 2018-02-22 VITALS — BP 164/71 | HR 68 | Ht 66.0 in | Wt 166.8 lb

## 2018-02-22 DIAGNOSIS — I428 Other cardiomyopathies: Secondary | ICD-10-CM

## 2018-02-22 DIAGNOSIS — I5022 Chronic systolic (congestive) heart failure: Secondary | ICD-10-CM | POA: Diagnosis not present

## 2018-02-22 DIAGNOSIS — I10 Essential (primary) hypertension: Secondary | ICD-10-CM

## 2018-02-22 MED ORDER — CARVEDILOL 12.5 MG PO TABS: 18.7500 mg | ORAL_TABLET | Freq: Two times a day (BID) | ORAL | Status: DC

## 2018-02-22 NOTE — Patient Instructions (Signed)
Medication Instructions:  - Your physician has recommended you make the following change in your medication:   1) INCREASE coreg (carvedilol) 12.5 mg- take 1.5 tablets (18.75 mg) by mouth TWICE daily  If you need a refill on your cardiac medications before your next appointment, please call your pharmacy.   Lab work: - none ordered  If you have labs (blood work) drawn today and your tests are completely normal, you will receive your results only by: Marland Kitchen MyChart Message (if you have MyChart) OR . A paper copy in the mail If you have any lab test that is abnormal or we need to change your treatment, we will call you to review the results.  Testing/Procedures: - none ordered  Follow-Up: At [facility], you and your health needs are our priority.  As part of our continuing mission to provide you with exceptional heart care, we have created designated Provider Care Teams.  These Care Teams include your primary Cardiologist (physician) and Advanced Practice Providers (APPs -  Physician Assistants and Nurse Practitioners) who all work together to provide you with the care you need, when you need it. You will need a follow up appointment in 2-3 months. You may see Kathlyn Sacramento, MD or one of the following Advanced Practice Providers on your designated Care Team:   Murray Hodgkins, NP Christell Faith, PA-C . Marrianne Mood, PA-C  Any Other Special Instructions Will Be Listed Below (If Applicable). - N/A

## 2018-02-22 NOTE — Progress Notes (Signed)
Office Visit    Patient Name: Jodi Bullock Date of Encounter: 02/22/2018  Primary Care Provider:  Tracie Harrier, MD Primary Cardiologist:  Kathlyn Sacramento, MD  Chief Complaint    84 y/o ? with a h/o NICM, HFmEF (EF 40-45%), HTN, HL, thymus cancer, remote breast cancer, and recent fx of R femur, who presents for f/u related to HTN.  Past Medical History    Past Medical History:  Diagnosis Date  . Arthritis   . Bladder incontinence   . Breast cancer ([place]) 1998  . Chronic systolic CHF (congestive heart failure) ([place]) 01/10/2016   a. 09/2015 Echo: EF 40-45%, possible inf HK, mod PAH (PASP 68mmHg).  . Closed fracture of right femur ([place])    a. 08/2017->Managed conservatively.  . Depression   . GERD (gastroesophageal reflux disease)   . History of kidney stones   . HLD (hyperlipidemia)   . Hypertension   . Neuropathy of both feet   . NICM (nonischemic cardiomyopathy) ([place])    a. 08/2015 Lexiscan MV: EF 39%, no ischemia; b. 09/2015 Echo: EF 40-45%.  . Right Humerus head fracture    a. 07/2016 - managed conservatively.  . 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 ABDOMINAL HYSTERECTOMY    . BREAST LUMPECTOMY    . CATARACT EXTRACTION    . CHOLECYSTECTOMY    . JOINT REPLACEMENT Right    Hip  . KNEE SURGERY Right     Allergies  Allergies  Allergen Reactions  . 2,4-D Dimethylamine (Amisol) Other (See Comments)    Other Reaction: OTHER REACTION-IRRITATION TO Esophagous  . Celebrex [Celecoxib] Other (See Comments)     esophagitis Esophageal spasms    . Ciprofloxacin Other (See Comments)    Esophageal irritation  . Ibuprofen     Other reaction(s): Other (See Comments) reflux  . Metronidazole Other (See Comments)    neuropathy  . Naproxen Other (See Comments)    GI UPSET  . Other Other (See Comments)    Other Reaction: esophagitis    History of Present Illness    84 year old female with the above complex past  medical history including hypertension, hyperlipidemia, GERD, nonischemic cardiomyopathy, HFmEF (EF 40-45%), thymus cancer, and remote breast cancer.  Cardiac history dates back to April 2017 when she was evaluated with exertional dyspnea.  Stress testing showed an EF of 39% without ischemia.  Follow-up echocardiography showed an EF of 40 to 45% with possible inferior hypokinesis.  Medical therapy was initiated including beta-blocker and ARB.  She was on Lasix but has had poor tolerance of this in the setting of bladder incontinence.    I saw her in clinic in June, at which time she noted chronic stable dyspnea on exertion and an increase in chronic right lower extremity swelling.  We switched her from HCTZ to Lasix and she noted improvement in lower extremity swelling when she was seen back in July.  By early September, she was having frequent urination which she attributed to Lasix and she stopped taking it.  She has most recently been staying at an assisted living facility with her husband.  She has not taken Lasix in the past month and her weight has been stable at about 164 pounds on her scales.  Her blood pressure typically runs in the 130s at home though she is higher today at 164/71.  Though she recently had a mechanical fall requiring an ER visit on September 30, she  did not suffer any significant trauma and has otherwise been doing well.  She denies chest pain, dyspnea, palpitations, PND, orthopnea, dizziness, syncope, edema, or early satiety.  Home Medications    Prior to Admission medications   Medication Sig Start Date End Date Taking? Authorizing Provider  acetaminophen (TYLENOL) 650 MG CR tablet Take 650 mg by mouth 2 (two) times daily.   Yes [provider]  aspirin EC 81 MG tablet Take 1 tablet (81 mg total) by mouth daily. 11/08/17  Yes Theora Gianotti, NP  benzonatate (TESSALON) 200 MG capsule Take 1 capsule (200 mg total) by mouth 3 (three) times daily as needed for  cough. 09/07/17  Yes Laverle Hobby, MD  carvedilol (COREG) 12.5 MG tablet Take 1 tablet (12.5 mg total) by mouth 2 (two) times daily. 11/08/17 02/22/18 Yes Theora Gianotti, NP  Cholecalciferol 2000 UNITS TABS Take 1 tablet by mouth daily.   Yes Roselee Nova, MD  Cyanocobalamin (VITAMIN DEFICIENCY SYSTEM-B12 IJ) Inject as directed every 30 (thirty) days.   Yes [provider]  docusate sodium (COLACE) 100 MG capsule Take 100 mg by mouth daily.   Yes [provider]  fluticasone (FLONASE) 50 MCG/ACT nasal spray Place 1 spray into both nostrils daily. 08/30/17  Yes Wilhelmina Mcardle, MD  Fluticasone Furoate (ARNUITY ELLIPTA) 100 MCG/ACT AEPB Inhale 1 puff into the lungs daily. 08/30/17  Yes Wilhelmina Mcardle, MD  loperamide (IMODIUM A-D) 2 MG tablet Take 2 mg by mouth as needed for diarrhea or loose stools.   Yes [provider]  loratadine (CLARITIN) 10 MG tablet Take 1 tablet (10 mg total) by mouth at bedtime. 08/30/17 08/30/18 Yes Wilhelmina Mcardle, MD  LORazepam (ATIVAN) 0.5 MG tablet Take 1 tablet (0.5 mg total) by mouth 2 (two) times daily as needed for anxiety. 05/23/17  Yes Roselee Nova, MD  losartan (COZAAR) 100 MG tablet Take 1 tablet (100 mg total) by mouth daily. 05/23/17  Yes Roselee Nova, MD  lovastatin (MEVACOR) 40 MG tablet Take 1 tablet (40 mg total) by mouth daily. Patient taking differently: Take 40 mg by mouth daily. Pt takes 3 times weekly 05/23/17  Yes Roselee Nova, MD  Multiple Vitamins-Minerals (PRESERVISION/LUTEIN) CAPS Take 1 each by mouth 2 (two) times daily.   Yes [provider]  omeprazole (PRILOSEC) 40 MG capsule Take 40 mg by mouth daily.   Yes [provider]  polyethylene glycol powder (GLYCOLAX/MIRALAX) powder 17 grams a day 10/22/09  Yes [provider]  traMADol (ULTRAM) 50 MG tablet Take by mouth.   Yes [provider]    Review of Systems    Some degree of mild/chronic right  ankle swelling.  She denies chest pain, palpitations, dyspnea, pnd, orthopnea, n, v, dizziness, syncope, weight gain, or early satiety.  All other systems reviewed and are otherwise negative except as noted above.  Physical Exam    VS:  BP (!) 164/71 (BP Location: Left Arm, Patient Position: Sitting, Cuff Size: Normal)   Pulse 68   Ht 5\' 6"  (1.676 m)   Wt 166 lb 12 oz (75.6 kg)   BMI 26.91 kg/m  , BMI Body mass index is 26.91 kg/m. GEN: Well nourished, well developed, in no acute distress. HEENT: normal. Neck: Supple, no JVD, carotid bruits, or masses. Cardiac: RRR, no murmurs, rubs, or gallops. No clubbing, cyanosis, trace right ankle edema-chronic.  Radials/DP/PT 1 + and equal bilaterally.  Respiratory:  Respirations  regular and unlabored, clear to auscultation bilaterally. GI: Soft, nontender, nondistended, BS + x 4. MS: no deformity or atrophy. Skin: warm and dry, no rash. Neuro:  Strength and sensation are intact. Psych: Normal affect.  Accessory Clinical Findings    ECG personally reviewed by me today -regular sinus rhythm, 68, first-degree AV block, LVH, inferior infarct- no acute changes.  Assessment & Plan    1.  Chronic systolic congestive heart failure/nonischemic cardiomyopathy: EF 40 to 45% with previous negative stress testing in 2017.  Though she previously required Lasix, she has been off of this since September 1 in the setting of frequent urination.  She had similar issues with Lasix in the past.  She has been monitoring her weight very closely and it has been stable dating back all the way to the time she came off of Lasix.  She prefers to stay off of it.  In that setting, I have taken it as well as potassium chloride off her list and have written orders for her assisted living to do the same.  Her blood pressure is elevated and I am increasing her carvedilol to 18.75 mg twice daily.  She otherwise remains on losartan therapy.  2.  Essential hypertension: As above,  blood pressure elevated.  Increasing carvedilol to 18.75 mg twice daily.  3.  Hyperlipidemia: Last LDL was 70 May 2018.  Continue statin therapy.  4.  Chronic right lower extremity swelling: This is stable and improved overall.  She has not required diuretics.  5.  Disposition: Overall doing well.  Adjusting blood pressure medication.  She will continue to follow this at home.  Follow-up in clinic in 3 months or sooner if necessary.   Murray Hodgkins, NP 02/22/2018, 5:20 PM

## 2018-03-04 ENCOUNTER — Encounter: Payer: Self-pay | Admitting: Urology

## 2018-03-04 ENCOUNTER — Other Ambulatory Visit: Payer: Self-pay

## 2018-03-04 ENCOUNTER — Ambulatory Visit (INDEPENDENT_AMBULATORY_CARE_PROVIDER_SITE_OTHER): Payer: Medicare Other | Admitting: Urology

## 2018-03-04 VITALS — BP 180/67 | HR 68 | Ht 66.0 in | Wt 167.5 lb

## 2018-03-04 DIAGNOSIS — N3020 Other chronic cystitis without hematuria: Secondary | ICD-10-CM

## 2018-03-04 MED ORDER — TRIMETHOPRIM 100 MG PO TABS: 100.0000 mg | ORAL_TABLET | Freq: Every day | ORAL | 11 refills | Status: DC

## 2018-03-04 NOTE — Progress Notes (Signed)
03/04/2018 8:19 AM   Jodi Bullock 1924-12-08 [id number]  Referring provider: Tracie Harrier, MD [address] [facility] [place], [place] [address]  Chief Complaint  Patient presents with  . Cystitis    HPI: The patient has been assessed in our clinic multiple times.  She could not tolerate or benefit from all the overactive bladder medications.  She has had percutaneous tibial nerve stimulation.  She did not reach her treatment goal.  Nighttime symptoms were quite bothersome.  Today Some of the details of the history were a bit challenging.  She can void every 1 hour during the day or less so.  She is getting up 1-4 times a night.  She does have incontinence worsening.  She recently has not been getting bladder infections.  She no longer takes Lasix.  She takes prednisone as needed.  She has mild ankle edema  Treatment options for nighttime frequency are limited.  We really do not have other treatments for her daytime symptoms.  I will send the urine for culture.  The role of desmopressin with pros cons risks and black box warning described.  5 weeks of low-dose desmopressin melt given.  Basic metabolic panel ordered today and we will call with results.  Do not start medication until called.  Serum sodium in about 1 week and see me in 4 weeks.  I felt that as long as her lab tests were normal that the medication was safe to take recognizing her age and comorbidity.  The symptoms are significantly affecting her quality of life   Today The patient had read the package label and never took the medication.  She was concerned about her comorbidities including a past history of congestive heart failure  The patient thinks she has had 2 bladder infections recently.  She was voiding every 15 minutes at night with increased frequency during the day and nonspecific suprapubic complaints.  She had no burning or pain otherwise.  Frequency has improved apparently since she went  off her diuretic.  She is not certain if she is infected today.  I did not have urine cultures.  She said to antibiotics given to her with questionable response or a initial response  Modifying factors: There are no other modifying factors  Associated signs and symptoms: There are no other associated signs and symptoms Aggravating and relieving factors: There are no other aggravating or relieving factors Severity: Moderate Duration: Persistent     PMH: Past Medical History:  Diagnosis Date  . Arthritis   . Bladder incontinence   . Breast cancer ([place]) 1998  . Chronic systolic CHF (congestive heart failure) ([place]) 01/10/2016   a. 09/2015 Echo: EF 40-45%, possible inf HK, mod PAH (PASP 63mmHg).  . Closed fracture of right femur ([place])    a. 08/2017->Managed conservatively.  . Depression   . GERD (gastroesophageal reflux disease)   . History of kidney stones   . HLD (hyperlipidemia)   . Hypertension   . Neuropathy of both feet   . NICM (nonischemic cardiomyopathy) ([place])    a. 08/2015 Lexiscan MV: EF 39%, no ischemia; b. 09/2015 Echo: EF 40-45%.  . Right Humerus head fracture    a. 07/2016 - managed conservatively.  . 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 ABDOMINAL HYSTERECTOMY    . BREAST LUMPECTOMY    . CATARACT EXTRACTION    . CHOLECYSTECTOMY    . JOINT  REPLACEMENT Right    Hip  . KNEE SURGERY Right     Home Medications:  Allergies as of 03/04/2018      Reactions   2,4-d Dimethylamine (amisol) Other (See Comments)   Other Reaction: OTHER REACTION-IRRITATION TO Esophagous   Celebrex [celecoxib] Other (See Comments)    esophagitis Esophageal spasms     Ciprofloxacin Other (See Comments)   Esophageal irritation   Ibuprofen    Other reaction(s): Other (See Comments) reflux   Metronidazole Other (See Comments)   neuropathy   Naproxen Other (See Comments)   GI UPSET   Other Other (See Comments)   Other  Reaction: esophagitis      Medication List        Accurate as of 03/04/18  8:19 AM. Always use your most recent med list.          acetaminophen 650 MG CR tablet Commonly known as:  TYLENOL Take 650 mg by mouth 2 (two) times daily.   aspirin EC 81 MG tablet Take 1 tablet (81 mg total) by mouth daily.   benzonatate 200 MG capsule Commonly known as:  TESSALON Take 1 capsule (200 mg total) by mouth 3 (three) times daily as needed for cough.   carvedilol 12.5 MG tablet Commonly known as:  COREG Take 1.5 tablets (18.75 mg total) by mouth 2 (two) times daily.   Cholecalciferol 2000 units Tabs Take 1 tablet by mouth daily.   docusate sodium 100 MG capsule Commonly known as:  COLACE Take 100 mg by mouth daily.   fluticasone 50 MCG/ACT nasal spray Commonly known as:  FLONASE Place 1 spray into both nostrils daily.   Fluticasone Furoate 100 MCG/ACT Aepb Inhale 1 puff into the lungs daily.   loperamide 2 MG tablet Commonly known as:  IMODIUM A-D Take 2 mg by mouth as needed for diarrhea or loose stools.   loratadine 10 MG tablet Commonly known as:  CLARITIN Take 1 tablet (10 mg total) by mouth at bedtime.   LORazepam 0.5 MG tablet Commonly known as:  ATIVAN Take 1 tablet (0.5 mg total) by mouth 2 (two) times daily as needed for anxiety.   losartan 100 MG tablet Commonly known as:  COZAAR Take 1 tablet (100 mg total) by mouth daily.   lovastatin 40 MG tablet Commonly known as:  MEVACOR Take 1 tablet (40 mg total) by mouth daily.   omeprazole 40 MG capsule Commonly known as:  PRILOSEC Take 40 mg by mouth daily.   polyethylene glycol powder powder Commonly known as:  GLYCOLAX/MIRALAX 17 grams a day   PRESERVISION/LUTEIN Caps Take 1 each by mouth 2 (two) times daily.   traMADol 50 MG tablet Commonly known as:  ULTRAM Take by mouth.   VITAMIN DEFICIENCY SYSTEM-B12 IJ Inject as directed every 30 (thirty) days.       Allergies:  Allergies  Allergen  Reactions  . 2,4-D Dimethylamine (Amisol) Other (See Comments)    Other Reaction: OTHER REACTION-IRRITATION TO Esophagous  . Celebrex [Celecoxib] Other (See Comments)     esophagitis Esophageal spasms    . Ciprofloxacin Other (See Comments)    Esophageal irritation  . Ibuprofen     Other reaction(s): Other (See Comments) reflux  . Metronidazole Other (See Comments)    neuropathy  . Naproxen Other (See Comments)    GI UPSET  . Other Other (See Comments)    Other Reaction: esophagitis    Family History: Family History  Problem Relation Age of Onset  . Stroke  Mother   . Diabetes Father   . Heart disease Father   . Kidney disease Neg Hx   . Bladder Cancer Neg Hx   . Kidney cancer Neg Hx     Social History:  reports that she has quit smoking. She has never used smokeless tobacco. She reports that she does not drink alcohol or use drugs.  ROS:                                        Physical Exam: BP (!) 180/67   Pulse 68   Ht 5\' 6"  (1.676 m)   Wt 76 kg   BMI 27.04 kg/m   Constitutional:  Alert and oriented, No acute distress.   Laboratory Data: Lab Results  Component Value Date   WBC 10.9 09/13/2017   HGB 12.2 09/13/2017   HCT 36.0 09/13/2017   MCV 89.9 09/13/2017   PLT 187 09/13/2017    Lab Results  Component Value Date   CREATININE 0.78 11/15/2017    No results found for: PSA  No results found for: TESTOSTERONE  Lab Results  Component Value Date   HGBA1C 5.4 02/14/2016    Urinalysis    Component Value Date/Time   COLORURINE YELLOW (A) 09/11/2017 2034   APPEARANCEUR CLEAR (A) 09/11/2017 2034   APPEARANCEUR Clear 08/06/2017 1425   LABSPEC 1.013 09/11/2017 2034   PHURINE 5.0 09/11/2017 2034   GLUCOSEU NEGATIVE 09/11/2017 2034   HGBUR NEGATIVE 09/11/2017 2034   BILIRUBINUR NEGATIVE 09/11/2017 2034   BILIRUBINUR Negative 08/06/2017 1425   KETONESUR 5 (A) 09/11/2017 2034   PROTEINUR NEGATIVE 09/11/2017 2034   NITRITE  NEGATIVE 09/11/2017 2034   LEUKOCYTESUR NEGATIVE 09/11/2017 2034   LEUKOCYTESUR Negative 08/06/2017 1425    Pertinent Imaging:   Assessment & Plan: The patient might be having bladder infections.  I thought it was reasonable to place the patient on daily suppression therapy with trimethoprim 100 mg.  We would send the urine for culture.  I would see her back in about 8 weeks.  I do not have other treatments for her overactive bladder symptoms and nocturia  1. Chronic cystitis  - Urinalysis, Complete   No follow-ups on file.  Reece Packer, MD  [facility] [address] [place], [place] [address] [phone]

## 2018-03-06 LAB — URINE CULTURE: ORGANISM ID, BACTERIA: NO GROWTH

## 2018-03-12 LAB — URINALYSIS, COMPLETE
BILIRUBIN UA: NEGATIVE
Glucose, UA: NEGATIVE
KETONES UA: NEGATIVE
LEUKOCYTES UA: NEGATIVE
Nitrite, UA: NEGATIVE
PH UA: 7 (ref 5.0–7.5)
PROTEIN UA: NEGATIVE
RBC, UA: NEGATIVE
SPEC GRAV UA: 1.015 (ref 1.005–1.030)
UUROB: 0.2 mg/dL (ref 0.2–1.0)

## 2018-03-12 LAB — MICROSCOPIC EXAMINATION
BACTERIA UA: NONE SEEN
RBC, UA: NONE SEEN /hpf (ref 0–2)

## 2018-04-24 ENCOUNTER — Encounter: Payer: Self-pay | Admitting: Pulmonary Disease

## 2018-04-24 ENCOUNTER — Ambulatory Visit (INDEPENDENT_AMBULATORY_CARE_PROVIDER_SITE_OTHER): Payer: Medicare Other | Admitting: Pulmonary Disease

## 2018-04-24 VITALS — BP 170/90 | HR 82

## 2018-04-24 DIAGNOSIS — R05 Cough: Secondary | ICD-10-CM | POA: Diagnosis not present

## 2018-04-24 DIAGNOSIS — J410 Simple chronic bronchitis: Secondary | ICD-10-CM | POA: Diagnosis not present

## 2018-04-24 DIAGNOSIS — R053 Chronic cough: Secondary | ICD-10-CM

## 2018-04-24 DIAGNOSIS — J0191 Acute recurrent sinusitis, unspecified: Secondary | ICD-10-CM | POA: Diagnosis not present

## 2018-04-24 MED ORDER — AMOXICILLIN-POT CLAVULANATE 500-125 MG PO TABS: 1.0000 | ORAL_TABLET | Freq: Two times a day (BID) | ORAL | 0 refills | Status: DC

## 2018-04-24 MED ORDER — AMOXICILLIN-POT CLAVULANATE 500-125 MG PO TABS
1.0000 | ORAL_TABLET | Freq: Two times a day (BID) | ORAL | 0 refills | Status: DC
Start: 2018-04-24 — End: 2018-05-03

## 2018-04-24 NOTE — Patient Instructions (Signed)
Augmentin 500-125, 1 pill twice a day for 5 days You may use Afrin nasal spray or equivalent twice a day for 3 to 5 days.  No longer than 5 days Continue Flonase nasal inhaler Continue nasal washes Continue Arnuity inhaler Follow-up in 6 months.  Call sooner if needed call us back if your symptoms do not improve with the above measures

## 2018-04-24 NOTE — Progress Notes (Signed)
PULMONARY OFFICE FOLLOW UP NOTE  Requesting MD/Service: Manuella Ghazi, MD Date of initial consultation: 09/25/16 Reason for consultation: Cough  PT PROFILE: 84 y.o. female never smoker referred for evaluation and mgmt of chronic cough. Wheezing noted on exam. Initial plan: Continue PPI and nasal steroid. Add Breo inhaler  INTERVAL: Last seen 01/16/2018.  No major pulmonary events since that time.    SUBJ:  This is a scheduled follow-up.  She has no new pulmonary complaints.  However, she has persistent nasal complaints with rhinorrhea, discolored mucus, sinus pressure and paranasal pain.  She was treated with a Z-Pak 1 month ago without improvement in her symptoms.   Vitals:   04/24/18 1033  BP: (!) 170/90  Pulse: 82  SpO2: 94%  RA    EXAM:  Gen: NAD HEENT: NCAT, sclera white Neck: No JVD Lungs: breath sounds full, no wheezes or other adventitious sounds Cardiovascular: RRR, no murmurs Abdomen: Soft, nontender, normal BS Ext: Trace symmetric ankle edema Neuro: grossly intact Skin: Limited exam, no lesions noted    DATA:   BMP Latest Ref Rng & Units 11/15/2017 09/10/2017 09/09/2017  Glucose 70 - 99 mg/dL 99 114(H) 115(H)  BUN 8 - 23 mg/dL 18 16 15   Creatinine 0.44 - 1.00 mg/dL 0.78 0.63 0.69  BUN/Creat Ratio 12 - 28 - - -  Sodium 135 - 145 mmol/L 142 141 141  Potassium 3.5 - 5.1 mmol/L 4.2 3.7 4.2  Chloride 98 - 111 mmol/L 104 106 105  CO2 22 - 32 mmol/L 31 28 27   Calcium 8.9 - 10.3 mg/dL 9.1 8.7(L) 9.0    CBC Latest Ref Rng & Units 09/13/2017 09/10/2017 09/09/2017  WBC 3.6 - 11.0 K/uL 10.9 7.1 13.5(H)  Hemoglobin 12.0 - 16.0 g/dL 12.2 13.0 14.1  Hematocrit 35.0 - 47.0 % 36.0 38.3 43.4  Platelets 150 - 440 K/uL 187 190 218    CXR: No new film  IMPRESSION:      ICD-10-CM   1. Acute recurrent sinusitis, unspecified location J01.91   2. Simple chronic bronchitis, well controlled on ICS J41.0   3. Chronic cough, resolved R05      PLAN:  For symptoms of acute sinusitis:  Augmentin 500-125, 1 pill twice a day for 5 days Instructed that she may use Afrin nasal spray or equivalent twice a day for 3 to 5 days.  No longer than 5 days Continue Flonase nasal inhaler Continue nasal washes Continue Arnuity inhaler  Follow-up in 6 months.  She is to call sooner if needed call us back if her symptoms do not improve with the above measures    Merton Border, MD PCCM service Mobile [phone] Pager [phone] 04/24/2018 2:24 PM

## 2018-04-29 ENCOUNTER — Ambulatory Visit (INDEPENDENT_AMBULATORY_CARE_PROVIDER_SITE_OTHER): Payer: Medicare Other | Admitting: Urology

## 2018-04-29 ENCOUNTER — Encounter: Payer: Self-pay | Admitting: Urology

## 2018-04-29 VITALS — BP 171/66 | HR 71 | Ht 66.0 in | Wt 164.8 lb

## 2018-04-29 DIAGNOSIS — N3941 Urge incontinence: Secondary | ICD-10-CM | POA: Diagnosis not present

## 2018-04-29 DIAGNOSIS — N3020 Other chronic cystitis without hematuria: Secondary | ICD-10-CM | POA: Diagnosis not present

## 2018-04-29 NOTE — Progress Notes (Signed)
04/29/2018 10:00 AM   Jodi Bullock 11/12/1924 [id number]  Referring provider: Tracie Harrier, MD [address] [facility] [place], [place] [address]  Chief Complaint  Patient presents with  . Urinary Incontinence    HPI: The patient has been assessed in our clinic multiple times. She could not tolerate or benefit from all the overactive bladder medications. She has had percutaneous tibial nerve stimulation. She did not reach her treatment goal. Nighttime symptoms were quite bothersome.  Today Some of the details of the history were a bit challenging. She can void every 1 hour during the day or less so. She is getting up 1-4 times a night. She does have incontinence worsening. She recently has not been getting bladder infections. She no longer takes Lasix. She takes prednisone as needed. She has mild ankle edema  Treatment options for nighttime frequency are limited. We really do not have other treatments for her daytime symptoms. I will send the urine for culture. The role of desmopressin with pros cons risks and black box warning described.5 weeks of low-dose desmopressin melt given. Basic metabolic panel ordered today and we will call with results. Do not start medication until called. Serum sodium in about 1 week and see me in 4 weeks.I felt that as long as her lab tests were normal that the medication was safe to take recognizing her age and comorbidity. The symptoms are significantly affecting her quality of life  The patient had read the package label and never took the medication.  She was concerned about her comorbidities including a past history of congestive heart failure  The patient thinks she has had 2 bladder infections recently.  She was voiding every 15 minutes at night with increased frequency during the day and nonspecific suprapubic complaints.  She had no burning or pain otherwise.  Frequency has improved apparently since  she went off her diuretic.  She is not certain if she is infected today.  I did not have urine cultures.  She said to antibiotics given to her with questionable response or a initial response  The patient might be having bladder infections.  I thought it was reasonable to place the patient on daily suppression therapy with trimethoprim 100 mg.  We would send the urine for culture.  I would see her back in about 8 weeks.  I do not have other treatments for her overactive bladder symptoms and nocturia  Today Last urine culture negative.  Frequency stable.  Patient really thinks the once a day antibiotic is helped her frequency and nighttime frequency.  Clinically not infected today   PMH: Past Medical History:  Diagnosis Date  . Arthritis   . Bladder incontinence   . Breast cancer ([place]) 1998  . Chronic systolic CHF (congestive heart failure) ([place]) 01/10/2016   a. 09/2015 Echo: EF 40-45%, possible inf HK, mod PAH (PASP 65mmHg).  . Closed fracture of right femur ([place])    a. 08/2017->Managed conservatively.  . Depression   . GERD (gastroesophageal reflux disease)   . History of kidney stones   . HLD (hyperlipidemia)   . Hypertension   . Neuropathy of both feet   . NICM (nonischemic cardiomyopathy) ([place])    a. 08/2015 Lexiscan MV: EF 39%, no ischemia; b. 09/2015 Echo: EF 40-45%.  . Right Humerus head fracture    a. 07/2016 - managed conservatively.  . 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  ABDOMINAL HYSTERECTOMY    . BREAST LUMPECTOMY    . CATARACT EXTRACTION    . CHOLECYSTECTOMY    . JOINT REPLACEMENT Right    Hip  . KNEE SURGERY Right     Home Medications:  Allergies as of 04/29/2018      Reactions   2,4-d Dimethylamine (amisol) Other (See Comments)   Other Reaction: OTHER REACTION-IRRITATION TO Esophagous   Celebrex [celecoxib] Other (See Comments)    esophagitis Esophageal spasms     Ciprofloxacin Other (See  Comments)   Esophageal irritation   Ibuprofen    Other reaction(s): Other (See Comments) reflux   Metronidazole Other (See Comments)   neuropathy   Naproxen Other (See Comments)   GI UPSET   Other Other (See Comments)   Other Reaction: esophagitis      Medication List       Accurate as of April 29, 2018 10:00 AM. Always use your most recent med list.        acetaminophen 650 MG CR tablet Commonly known as:  TYLENOL Take 650 mg by mouth 2 (two) times daily.   amoxicillin-clavulanate 500-125 MG tablet Commonly known as:  AUGMENTIN Take 1 tablet (500 mg total) by mouth 2 (two) times daily.   aspirin EC 81 MG tablet Take 1 tablet (81 mg total) by mouth daily.   benzonatate 200 MG capsule Commonly known as:  TESSALON Take 1 capsule (200 mg total) by mouth 3 (three) times daily as needed for cough.   carvedilol 12.5 MG tablet Commonly known as:  COREG Take 1.5 tablets (18.75 mg total) by mouth 2 (two) times daily.   Cholecalciferol 50 MCG (2000 UT) Tabs Take 1 tablet by mouth daily.   docusate sodium 100 MG capsule Commonly known as:  COLACE Take 100 mg by mouth daily.   fluticasone 50 MCG/ACT nasal spray Commonly known as:  FLONASE Place 1 spray into both nostrils daily.   Fluticasone Furoate 100 MCG/ACT Aepb Commonly known as:  ARNUITY ELLIPTA Inhale 1 puff into the lungs daily.   loperamide 2 MG tablet Commonly known as:  IMODIUM A-D Take 2 mg by mouth as needed for diarrhea or loose stools.   loratadine 10 MG tablet Commonly known as:  CLARITIN Take 1 tablet (10 mg total) by mouth at bedtime.   LORazepam 0.5 MG tablet Commonly known as:  ATIVAN Take 1 tablet (0.5 mg total) by mouth 2 (two) times daily as needed for anxiety.   losartan 100 MG tablet Commonly known as:  COZAAR Take 1 tablet (100 mg total) by mouth daily.   lovastatin 40 MG tablet Commonly known as:  MEVACOR Take 1 tablet (40 mg total) by mouth daily.   omeprazole 40 MG  capsule Commonly known as:  PRILOSEC Take 40 mg by mouth daily.   polyethylene glycol powder powder Commonly known as:  GLYCOLAX/MIRALAX 17 grams a day   PRESERVISION/LUTEIN Caps Take 1 each by mouth 2 (two) times daily.   traMADol 50 MG tablet Commonly known as:  ULTRAM Take by mouth.   trimethoprim 100 MG tablet Commonly known as:  TRIMPEX Take 1 tablet (100 mg total) by mouth daily.   VITAMIN DEFICIENCY SYSTEM-B12 IJ Inject as directed every 30 (thirty) days.       Allergies:  Allergies  Allergen Reactions  . 2,4-D Dimethylamine (Amisol) Other (See Comments)    Other Reaction: OTHER REACTION-IRRITATION TO Esophagous  . Celebrex [Celecoxib] Other (See Comments)     esophagitis Esophageal spasms    .  Ciprofloxacin Other (See Comments)    Esophageal irritation  . Ibuprofen     Other reaction(s): Other (See Comments) reflux  . Metronidazole Other (See Comments)    neuropathy  . Naproxen Other (See Comments)    GI UPSET  . Other Other (See Comments)    Other Reaction: esophagitis    Family History: Family History  Problem Relation Age of Onset  . Stroke Mother   . Diabetes Father   . Heart disease Father   . Kidney disease Neg Hx   . Bladder Cancer Neg Hx   . Kidney cancer Neg Hx     Social History:  reports that she has quit smoking. She has never used smokeless tobacco. She reports that she does not drink alcohol or use drugs.  ROS: UROLOGY Frequent Urination?: No Hard to postpone urination?: No Burning/pain with urination?: No Get up at night to urinate?: Yes Leakage of urine?: Yes Urine stream starts and stops?: No Trouble starting stream?: No Do you have to strain to urinate?: No Blood in urine?: No Urinary tract infection?: No Sexually transmitted disease?: No Injury to kidneys or bladder?: No Painful intercourse?: No Weak stream?: No Currently pregnant?: No Vaginal bleeding?: No Last menstrual period?: n  Gastrointestinal Nausea?:  No Vomiting?: No Indigestion/heartburn?: No Diarrhea?: No Constipation?: No  Constitutional Fever: No Night sweats?: No Weight loss?: No Fatigue?: No  Skin Skin rash/lesions?: No Itching?: No  Eyes Blurred vision?: No Double vision?: No  Ears/Nose/Throat Sore throat?: No Sinus problems?: No  Hematologic/Lymphatic Swollen glands?: No Easy bruising?: No  Cardiovascular Leg swelling?: No Chest pain?: No  Respiratory Cough?: No Shortness of breath?: No  Endocrine Excessive thirst?: No  Musculoskeletal Back pain?: Yes Joint pain?: Yes  Neurological Headaches?: No Dizziness?: No  Psychologic Depression?: No Anxiety?: Yes  Physical Exam: BP (!) 171/66 (BP Location: Left Arm, Patient Position: Sitting, Cuff Size: Normal)   Pulse 71   Ht 5\' 6"  (1.676 m)   Wt 164 lb 12.8 oz (74.8 kg)   BMI 26.60 kg/m   Constitutional:  Alert and oriented, No acute distress.   Laboratory Data: Lab Results  Component Value Date   WBC 10.9 09/13/2017   HGB 12.2 09/13/2017   HCT 36.0 09/13/2017   MCV 89.9 09/13/2017   PLT 187 09/13/2017    Lab Results  Component Value Date   CREATININE 0.78 11/15/2017    No results found for: PSA  No results found for: TESTOSTERONE  Lab Results  Component Value Date   HGBA1C 5.4 02/14/2016    Urinalysis    Component Value Date/Time   COLORURINE YELLOW (A) 09/11/2017 2034   APPEARANCEUR Clear 03/04/2018 0847   LABSPEC 1.013 09/11/2017 2034   PHURINE 5.0 09/11/2017 2034   GLUCOSEU Negative 03/04/2018 [place] 09/11/2017 2034   BILIRUBINUR Negative 03/04/2018 0847   KETONESUR 5 (A) 09/11/2017 2034   PROTEINUR Negative 03/04/2018 0847   PROTEINUR NEGATIVE 09/11/2017 2034   NITRITE Negative 03/04/2018 0847   NITRITE NEGATIVE 09/11/2017 2034   LEUKOCYTESUR Negative 03/04/2018 0847    Pertinent Imaging:   Assessment & Plan: Reassess 6 months  1. Urge incontinence   2. Chronic cystitis    No  follow-ups on file.  Reece Packer, MD  [facility] [address] [place], [place] [address] [phone]

## 2018-05-03 ENCOUNTER — Encounter: Payer: Self-pay | Admitting: Cardiovascular Disease

## 2018-05-03 ENCOUNTER — Ambulatory Visit (INDEPENDENT_AMBULATORY_CARE_PROVIDER_SITE_OTHER): Payer: Medicare Other | Admitting: Cardiovascular Disease

## 2018-05-03 VITALS — BP 160/72 | HR 65 | Ht 66.0 in | Wt 161.0 lb

## 2018-05-03 DIAGNOSIS — I5022 Chronic systolic (congestive) heart failure: Secondary | ICD-10-CM | POA: Diagnosis not present

## 2018-05-03 DIAGNOSIS — I10 Essential (primary) hypertension: Secondary | ICD-10-CM | POA: Diagnosis not present

## 2018-05-03 DIAGNOSIS — I493 Ventricular premature depolarization: Secondary | ICD-10-CM | POA: Diagnosis not present

## 2018-05-03 DIAGNOSIS — E785 Hyperlipidemia, unspecified: Secondary | ICD-10-CM | POA: Diagnosis not present

## 2018-05-03 NOTE — Progress Notes (Signed)
Cardiology Office Note   Date:  05/03/2018   ID:  Jodi Bullock, DOB Feb 08, 1925, MRN [id number]  PCP:  Patient, No Pcp Per  Cardiologist:   Kathlyn Sacramento, MD   Chief Complaint  Patient presents with  . other    2 mo follow up. Medications reviewed verbally.SOB with exertion. "Doing well"      History of Present Illness: Jodi Bullock is a 84 y.o. female who is here today for a follow-up visit regarding chronic systolic heart failure thought to be due to nonischemic cardiomyopathy with no previous cardiac catheterization given age and improvement in symptoms with medical therapy. She has multiple chronic medical conditions that include hypertension, hyperlipidemia, premature beats, thymus cancer status post resection and esophageal spasm. She was evaluated in April 2017 for exertional dyspnea. A pharmacologic nuclear stress test showed no evidence of perfusion defects. Ejection fraction was calculated to be 39% . Echocardiogram showed mildly reduced LV systolic function with an ejection fraction of 40-45% with possible inferior wall hypokinesis, elevated filling pressures and mild to moderate pulmonary hypertension with systolic pulmonary pressure 46 mmHg.    She fell in April and fractured her right femur.  She was treated conservatively and did not require surgery.   She had worsening volume overload that improved after switching hydrochlorothiazide to furosemide but she stopped the medication due to issues with urine incontinence.  She had another fall in September with no significant injuries. During last visit, the dose of carvedilol was increased to 18.75 mg twice daily due to elevated blood pressure.  She feels better without the diuretic given continued issues with urine incontinence.  She has been able to maintain her weight and if anything her weight actually has been going down.  No recent chest pain or worsening dyspnea.  No significant leg edema.  Past Medical History:    Diagnosis Date  . Arthritis   . Bladder incontinence   . Breast cancer ([place]) 1998  . Chronic systolic CHF (congestive heart failure) ([place]) 01/10/2016   a. 09/2015 Echo: EF 40-45%, possible inf HK, mod PAH (PASP 64mmHg).  . Closed fracture of right femur ([place])    a. 08/2017->Managed conservatively.  . Depression   . GERD (gastroesophageal reflux disease)   . History of kidney stones   . HLD (hyperlipidemia)   . Hypertension   . Neuropathy of both feet   . NICM (nonischemic cardiomyopathy) ([place])    a. 08/2015 Lexiscan MV: EF 39%, no ischemia; b. 09/2015 Echo: EF 40-45%.  . Right Humerus head fracture    a. 07/2016 - managed conservatively.  . Thymus cancer [facility])    cancer    Past Surgical History:  Procedure Laterality Date  . ABDOMINAL HYSTERECTOMY    . BREAST LUMPECTOMY    . CATARACT EXTRACTION    . CHOLECYSTECTOMY    . JOINT REPLACEMENT Right    Hip  . KNEE SURGERY Right      Current Outpatient Medications  Medication Sig Dispense Refill  . acetaminophen (TYLENOL) 650 MG CR tablet Take 650 mg by mouth 2 (two) times daily.    Marland Kitchen aspirin EC 81 MG tablet Take 1 tablet (81 mg total) by mouth daily. 90 tablet 3  . carvedilol (COREG) 12.5 MG tablet Take 1.5 tablets (18.75 mg total) by mouth 2 (two) times daily.    . Cholecalciferol 2000 UNITS TABS Take 1 tablet by mouth daily.    . Cyanocobalamin (VITAMIN DEFICIENCY SYSTEM-B12 IJ) Inject as directed every 30 (thirty) days.    Marland Kitchen  docusate sodium (COLACE) 100 MG capsule Take 100 mg by mouth daily.    . fluticasone (FLONASE) 50 MCG/ACT nasal spray Place 1 spray into both nostrils daily. 16 g 2  . Fluticasone Furoate (ARNUITY ELLIPTA) 100 MCG/ACT AEPB Inhale 1 puff into the lungs daily. 30 each 10  . loperamide (IMODIUM A-D) 2 MG tablet Take 2 mg by mouth as needed for diarrhea or loose stools.    Marland Kitchen loratadine (CLARITIN) 10 MG tablet Take 1 tablet (10 mg total) by mouth at bedtime. 30 tablet 5  . LORazepam (ATIVAN) 0.5 MG tablet Take  1 tablet (0.5 mg total) by mouth 2 (two) times daily as needed for anxiety. 60 tablet 2  . losartan (COZAAR) 100 MG tablet Take 1 tablet (100 mg total) by mouth daily. 30 tablet 2  . lovastatin (MEVACOR) 40 MG tablet Take 1 tablet (40 mg total) by mouth daily. (Patient taking differently: Take 40 mg by mouth daily. Pt takes 3 times weekly) 90 tablet 0  . Multiple Vitamins-Minerals (PRESERVISION/LUTEIN) CAPS Take 1 each by mouth 2 (two) times daily.    Marland Kitchen omeprazole (PRILOSEC) 40 MG capsule Take 40 mg by mouth daily.    . polyethylene glycol powder (GLYCOLAX/MIRALAX) powder 17 grams a day    . trimethoprim (TRIMPEX) 100 MG tablet Take 1 tablet (100 mg total) by mouth daily. 30 tablet 11  . traMADol (ULTRAM) 50 MG tablet Take by mouth.     No current facility-administered medications for this visit.     Allergies:   2,4-d dimethylamine (amisol); Celebrex [celecoxib]; Ciprofloxacin; Ibuprofen; Metronidazole; Naproxen; and Other    Social History:  The patient  reports that she has quit smoking. She has never used smokeless tobacco. She reports that she does not drink alcohol or use drugs.   Family History:  The patient's family history includes Diabetes in her father; Heart disease in her father; Stroke in her mother.    ROS:  Please see the history of present illness.   Otherwise, review of systems are positive for none.   All other systems are reviewed and negative.    PHYSICAL EXAM: VS:  BP (!) 160/72 (BP Location: Left Arm, Patient Position: Sitting, Cuff Size: Normal)   Pulse 65   Ht 5\' 6"  (1.676 m)   Wt 161 lb (73 kg)   BMI 25.99 kg/m  , BMI Body mass index is 25.99 kg/m. GEN: Well nourished, well developed, in no acute distress  HEENT: normal  Neck: no JVD, carotid bruits, or masses Cardiac: RRR; no murmurs, rubs, or gallops, trace bilateral edema worse on the right side Respiratory:  clear to auscultation bilaterally, normal work of breathing GI: soft, nontender, nondistended,  + BS MS: no deformity or atrophy  Skin: warm and dry, no rash Neuro:  Strength and sensation are intact Psych: euthymic mood, full affect   EKG:  EKG is  ordered today. EKG showed sinus rhythm with first-degree AV block 1 PVC and nonspecific changes.  Recent Labs: 09/09/2017: ALT 45 09/13/2017: Hemoglobin 12.2; Platelets 187 11/15/2017: BUN 18; Creatinine, Ser 0.78; Potassium 4.2; Sodium 142    Lipid Panel    Component Value Date/Time   CHOL 139 05/24/2017 1049   CHOL 131 06/04/2015 0810   TRIG 163 (H) 05/24/2017 1049   HDL 48 (L) 05/24/2017 1049   HDL 54 06/04/2015 0810   CHOLHDL 2.9 05/24/2017 1049   VLDL 29 10/11/2016 0804   LDLCALC 67 05/24/2017 1049      Wt Readings  from Last 3 Encounters:  05/03/18 161 lb (73 kg)  04/29/18 164 lb 12.8 oz (74.8 kg)  03/04/18 167 lb 8 oz (76 kg)        ASSESSMENT AND PLAN:  1.  Chronic systolic heart failure: Ejection fraction was 40-45% with mild to moderate pulmonary hypertension.  She appears to be euvolemic without diuretics.  Continue treatment with carvedilol and losartan.  2. PVCs: Seems to be well controlled with beta-blocker therapy.  3.  Essential hypertension: Blood pressure continues to be elevated.  We do have the option of increasing carvedilol to 25 mg twice daily.  However, I am concerned about causing too much bradycardia at her age group.  Continue to monitor for now and we do have the option of adding small dose amlodipine if needed.  4.  Hyperlipidemia: Currently on lovastatin with most recent LDL of 67.   Disposition:   FU in 4 months  Signed,  Kathlyn Sacramento, MD  05/03/2018 10:38 AM    [place]

## 2018-05-03 NOTE — Patient Instructions (Signed)
Medication Instructions:  No changes  If you need a refill on your cardiac medications before your next appointment, please call your pharmacy.   Lab work: None ordered  Testing/Procedures: None ordered  Follow-Up: At [facility], you and your health needs are our priority.  As part of our continuing mission to provide you with exceptional heart care, we have created designated Provider Care Teams.  These Care Teams include your primary Cardiologist (physician) and Advanced Practice Providers (APPs -  Physician Assistants and Nurse Practitioners) who all work together to provide you with the care you need, when you need it. You will need a follow up appointment in 4 months.  Please call our office 2 months in advance to schedule this appointment.  You may see Muhammad Arida, MD or one of the following Advanced Practice Providers on your designated Care Team:   Christopher [name], NP Ryan Dunn, PA-C . Jacquelyn Visser, PA-C   

## 2018-05-15 IMAGING — DX DG CHEST 1V
1 series · 1 of 1 positions shown · non-contrast
Comparison: 06/19/2016

CLINICAL DATA: Opacity in the left upper lobe. History of breast
cancer, lumpectomy, former smoker

EXAM:
CHEST 1 VIEW

[chest ap]
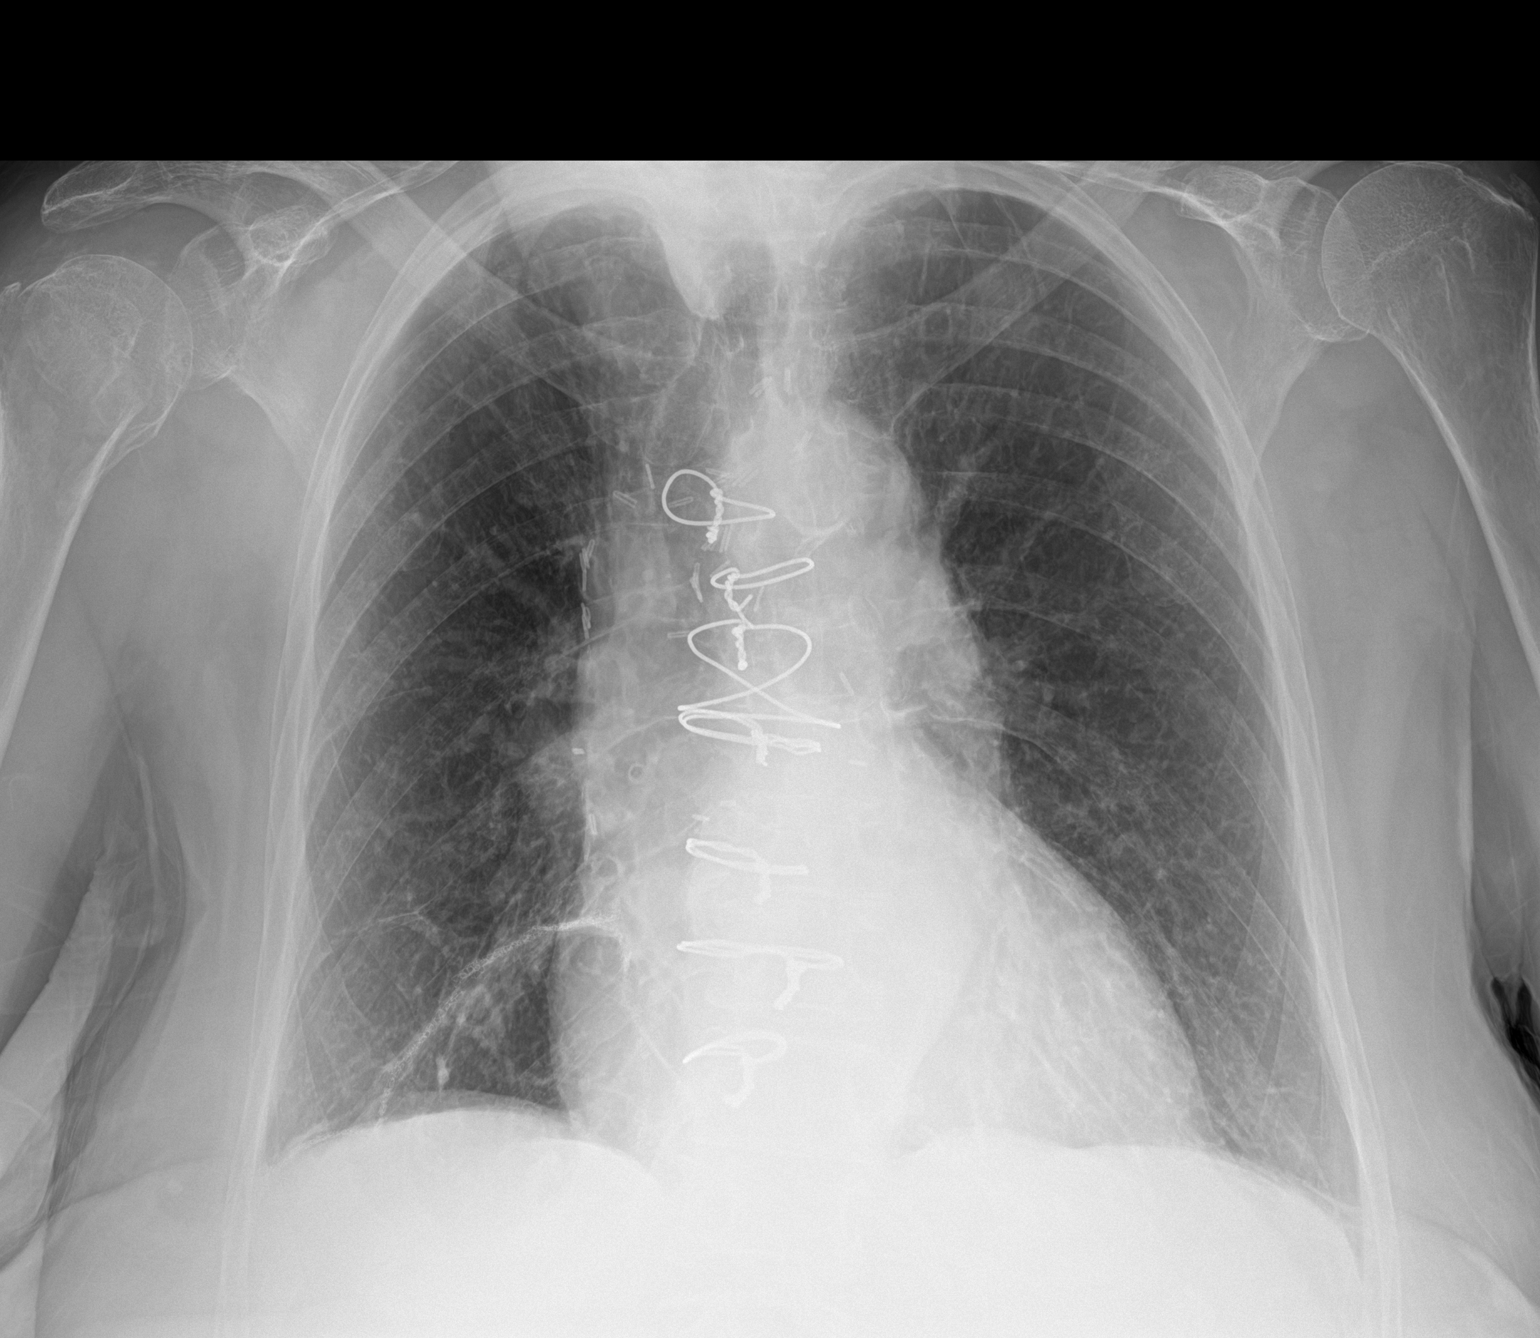

[1 of 1 positions shown; findings below may reference images not displayed]

FINDINGS: Stable cardiomegaly with tortuous atherosclerotic aorta. Stable
bilateral interstitial prominence consistent with chronic bronchitic
change. Median sternotomy sutures are in place with post CABG
change. Chain sutures are seen in the right lung base with adjacent
areas of scarring. No pulmonary consolidation or dominant mass. No
definite abnormality in the left upper lobe. There is subtle
increase in density likely due to overlap of the left first rib and
left mid clavicle. Osteoarthritis about both shoulders with probable
calcific rotator cuff tendinopathy.
IMPRESSION: Chronic stable bronchitic change. Postsurgical scarring with chain
sutures in the right lower lobe. No mass nor pneumonic consolidation
is identified radiographically. Stable cardiomegaly with aortic
atherosclerosis.

## 2018-05-28 ENCOUNTER — Emergency Department: Payer: Medicare Other

## 2018-05-28 ENCOUNTER — Other Ambulatory Visit: Payer: Self-pay

## 2018-05-28 ENCOUNTER — Emergency Department
Admission: EM | Admit: 2018-05-28 | Discharge: 2018-05-28 | Disposition: A | Discharge status: Home / Self Care | Payer: Medicare Other | Attending: Emergency Medicine | Admitting: Emergency Medicine

## 2018-05-28 ENCOUNTER — Encounter: Payer: Self-pay | Admitting: Intensive Care

## 2018-05-28 DIAGNOSIS — Z853 Personal history of malignant neoplasm of breast: Secondary | ICD-10-CM | POA: Diagnosis not present

## 2018-05-28 DIAGNOSIS — I5022 Chronic systolic (congestive) heart failure: Secondary | ICD-10-CM | POA: Diagnosis not present

## 2018-05-28 DIAGNOSIS — Z87891 Personal history of nicotine dependence: Secondary | ICD-10-CM | POA: Insufficient documentation

## 2018-05-28 DIAGNOSIS — J101 Influenza due to other identified influenza virus with other respiratory manifestations: Secondary | ICD-10-CM

## 2018-05-28 DIAGNOSIS — Z85238 Personal history of other malignant neoplasm of thymus: Secondary | ICD-10-CM | POA: Insufficient documentation

## 2018-05-28 DIAGNOSIS — R062 Wheezing: Secondary | ICD-10-CM | POA: Diagnosis present

## 2018-05-28 DIAGNOSIS — I110 Hypertensive heart disease with heart failure: Secondary | ICD-10-CM | POA: Insufficient documentation

## 2018-05-28 LAB — BASIC METABOLIC PANEL
Anion gap: 8 (ref 5–15)
BUN: 17 mg/dL (ref 8–23)
CO2: 24 mmol/L (ref 22–32)
Calcium: 8.5 mg/dL — ABNORMAL LOW (ref 8.9–10.3)
Chloride: 105 mmol/L (ref 98–111)
Creatinine, Ser: 0.83 mg/dL (ref 0.44–1.00)
GFR calc Af Amer: >60 mL/min (ref >60)
GFR calc non Af Amer: >60 mL/min (ref >60)
Glucose, Bld: 100 mg/dL — ABNORMAL HIGH (ref 70–99)
Potassium: 3.9 mmol/L (ref 3.5–5.1)
Sodium: 137 mmol/L (ref 135–145)

## 2018-05-28 LAB — INFLUENZA PANEL BY PCR (TYPE A & B)
INFLBPCR: POSITIVE — AB
Influenza A By PCR: NEGATIVE

## 2018-05-28 LAB — CBC WITH DIFFERENTIAL/PLATELET
Abs Immature Granulocytes: 0.02 10*3/uL (ref 0.00–0.07)
Basophils Absolute: 0 10*3/uL (ref 0.0–0.1)
Basophils Relative: 0 %
Eosinophils Absolute: 0.1 10*3/uL (ref 0.0–0.5)
Eosinophils Relative: 1 %
HCT: 39.9 % (ref 36.0–46.0)
Hemoglobin: 12.9 g/dL (ref 12.0–15.0)
Immature Granulocytes: 0 %
Lymphocytes Relative: 20 %
Lymphs Abs: 1.4 10*3/uL (ref 0.7–4.0)
MCH: 30.5 pg (ref 26.0–34.0)
MCHC: 32.3 g/dL (ref 30.0–36.0)
MCV: 94.3 fL (ref 80.0–100.0)
MONO ABS: 1.2 10*3/uL — AB (ref 0.1–1.0)
Monocytes Relative: 18 %
Neutro Abs: 4.3 10*3/uL (ref 1.7–7.7)
Neutrophils Relative %: 61 %
Platelets: 123 10*3/uL — ABNORMAL LOW (ref 150–400)
RBC: 4.23 MIL/uL (ref 3.87–5.11)
RDW: 13.1 % (ref 11.5–15.5)
WBC: 7 10*3/uL (ref 4.0–10.5)
nRBC: 0 % (ref 0.0–0.2)

## 2018-05-28 LAB — BLOOD GAS, VENOUS
ACID-BASE EXCESS: 1.2 mmol/L (ref 0.0–2.0)
Bicarbonate: 26 mmol/L (ref 20.0–28.0)
FIO2: 0.21
O2 Saturation: 75.5 %
Patient temperature: 37
pCO2, Ven: 41 mmHg — ABNORMAL LOW (ref 44.0–60.0)
pH, Ven: 7.41 (ref 7.250–7.430)
pO2, Ven: 40 mmHg (ref 32.0–45.0)

## 2018-05-28 LAB — BRAIN NATRIURETIC PEPTIDE: B NATRIURETIC PEPTIDE 5: 448 pg/mL — AB (ref 0.0–100.0)

## 2018-05-28 LAB — TROPONIN I: Troponin I: <0.03 ng/mL (ref <0.03)

## 2018-05-28 MED ORDER — OSELTAMIVIR PHOSPHATE 75 MG PO CAPS
75.0000 mg | ORAL_CAPSULE | Freq: Once | ORAL | Status: AC
  Administered 2018-05-28: 75 mg via ORAL
  Filled 2018-05-28: qty 1

## 2018-05-28 MED ORDER — OSELTAMIVIR PHOSPHATE 75 MG PO CAPS: 75.0000 mg | ORAL_CAPSULE | Freq: Two times a day (BID) | ORAL | 0 refills | Status: AC

## 2018-05-28 MED ORDER — BENZONATATE 200 MG PO CAPS: 200.0000 mg | ORAL_CAPSULE | Freq: Three times a day (TID) | ORAL | 0 refills | Status: DC | PRN

## 2018-05-28 MED ORDER — BENZONATATE 100 MG PO CAPS
200.0000 mg | ORAL_CAPSULE | Freq: Once | ORAL | Status: AC
  Administered 2018-05-28: 200 mg via ORAL
  Filled 2018-05-28: qty 2

## 2018-05-28 NOTE — ED Notes (Signed)
Patient transported to X-ray 

## 2018-05-28 NOTE — ED Triage Notes (Signed)
From [facility] for cough and fever for couple weeks. Has refused to come to ER a couple times. Wheezing noted. EMS vitals 100.1oral, 92% RA, 177/71 b/p, 70s HR. patient was put on antibiotic recently. A&O x4 during triage

## 2018-05-28 NOTE — ED Notes (Signed)
Contacted patients daughter who reports she is on the way to take patient back to facility

## 2018-05-28 NOTE — ED Notes (Signed)
Staff reports patient has had an intermittent fever for couple weeks

## 2018-05-28 NOTE — ED Provider Notes (Signed)
[facility] Emergency Department Provider Note       Time seen: ----------------------------------------- 10:46 AM on 05/28/2018 -----------------------------------------   I have reviewed the triage vital signs and the nursing notes.  HISTORY   Chief Complaint No chief complaint on file.    HPI Jodi Bullock is a 85 y.o. female with a history of arthritis, breast cancer, CHF, depression, GERD, hyperlipidemia, hypertension, thymus cancer who presents to the ED for cough and fever for several weeks.  Patient refused to come to the ER several times.  She was noted to be febrile with wheezing.  She received a DuoNeb and steroids in route with mild improvement.  She was noted to be 92% on room air.  She has not needed oxygen in the past.  Recently she has been on antibiotics for this cough.  Past Medical History:  Diagnosis Date  . Arthritis   . Bladder incontinence   . Breast cancer ([place]) 1998  . Chronic systolic CHF (congestive heart failure) ([place]) 01/10/2016   a. 09/2015 Echo: EF 40-45%, possible inf HK, mod PAH (PASP 33mmHg).  . Closed fracture of right femur ([place])    a. 08/2017->Managed conservatively.  . Depression   . GERD (gastroesophageal reflux disease)   . History of kidney stones   . HLD (hyperlipidemia)   . Hypertension   . Neuropathy of both feet   . NICM (nonischemic cardiomyopathy) ([place])    a. 08/2015 Lexiscan MV: EF 39%, no ischemia; b. 09/2015 Echo: EF 40-45%.  . Right Humerus head fracture    a. 07/2016 - managed conservatively.  . Thymus cancer [facility])    cancer    Patient Active Problem List   Diagnosis Date Noted  . Femur fracture ([place]) 09/09/2017  . Abnormal gait 12/19/2016  . Acquired spondylolisthesis 12/19/2016  . Breast lump 12/19/2016  . Cervical spondylosis without myelopathy 12/19/2016  . Closed Colles' fracture 12/19/2016  . Closed fracture of base of neck of femur ([place]) 12/19/2016  . Closed fracture of lower end of  radius and ulna 12/19/2016  . Closed fracture of neck of femur ([place]) 12/19/2016  . Spondylolisthesis, congenital 12/19/2016  . Contracture of joint of hand 12/19/2016  . Contracture of wrist joint 12/19/2016  . Contusion of hand 12/19/2016  . Contusion of hip 12/19/2016  . Contusion of knee 12/19/2016  . Degeneration of intervertebral disc at C4-C5 level 12/19/2016  . Degeneration of lumbar intervertebral disc 12/19/2016  . Disorder of coccyx 12/19/2016  . Dry eyes 12/19/2016  . Enthesopathy 12/19/2016  . Enthesopathy of hip region 12/19/2016  . Hand joint pain 12/19/2016  . Hand joint stiff 12/19/2016  . Hip pain 12/19/2016  . Joint pain 12/19/2016  . Knee pain 12/19/2016  . Localized, primary osteoarthritis 12/19/2016  . Loose body in hip joint 12/19/2016  . Lumbar sprain 12/19/2016  . Abnormal mammogram 12/19/2016  . Meibomian gland dysfunction 12/19/2016  . Muscle weakness 12/19/2016  . Open fracture of lower end of forearm 12/19/2016  . Presbyopia 12/19/2016  . Regular astigmatism 12/19/2016  . Shoulder joint pain 12/19/2016  . Skin sensation disturbance 12/19/2016  . Sprain of ankle 12/19/2016  . Trochanteric bursitis 12/19/2016  . Wrist joint pain 12/19/2016  . Stiffness of wrist joint 12/19/2016  . Acute bilateral low back pain without sciatica 09/29/2016  . Acute bronchitis 06/19/2016  . Recurrent productive cough 06/14/2016  . Medicare annual wellness visit, subsequent 05/17/2016  . Fall in elderly patient 01/27/2016  . Sprain of wrist, right  01/26/2016  . Systolic CHF with reduced left ventricular function, NYHA class 3 ([place]) 01/10/2016  . Productive cough 11/19/2015  . Chronic systolic heart failure ([place]) 10/12/2015  . Dyspnea on exertion 08/31/2015  . Fatigue 08/31/2015  . 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[place]) 08/04/2015  . Irregular heart rhythm 07/22/2015  . Left arm pain 06/29/2015  . Dyslipidemia 06/03/2015  . Acute recurrent maxillary sinusitis 06/03/2015  . Oral  mucosal lesion 05/26/2015  . Urge incontinence 02/14/2015  . Ankle edema 02/02/2015  . At risk for falling 01/25/2015  . Dizziness 01/25/2015  . Acid reflux 01/25/2015  . Big thyroid 01/25/2015  . Gravida 2 para 2 01/25/2015  . Personal history of malignant neoplasm of breast 01/25/2015  . Arthritis, degenerative 01/25/2015  . Parity 2 01/25/2015  . Peripheral blood vessel disorder ([place]) 01/25/2015  . Need for vaccination 01/25/2015  . Pain in rectum 01/25/2015  . Reflux 01/25/2015  . Screening for depression 01/25/2015  . Disease of accessory sinus 01/25/2015  . Headache, temporal 01/25/2015  . Primary cancer of thymus ([place]) 01/25/2015  . Disease of thyroid gland 01/25/2015  . Avitaminosis D 01/25/2015  . Family history of diabetes mellitus in father 12/31/2014  . Fasting hyperglycemia 12/31/2014  . Hypertension 12/01/2014  . HLD (hyperlipidemia) 12/01/2014  . Anxiety 12/01/2014  . Myofascial pain 12/01/2014  . Breast CA ([place]) 11/24/2014  . Absence of bladder continence 11/24/2014  . Urgency of micturation 11/24/2014  . Common bile duct dilatation 10/20/2014  . Pancreatic cyst 10/20/2014  . Kidney stones 10/20/2014  . Neuritis or radiculitis due to rupture of lumbar intervertebral disc 06/15/2014  . Lumbar canal stenosis 06/15/2014  . Degenerative arthritis of lumbar spine 06/15/2014  . Lumbar radiculitis 06/15/2014  . Osteoarthritis of spine with radiculopathy, lumbar region 06/15/2014  . 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 Bloodgood disease 11/05/2012  . Arthritis of temporomandibular joint 06/24/2012  . Atypical chest pain 01/05/2012  . Breath shortness 12/04/2011  . Lung mass 11/15/2011  . Thyroid nodule 11/15/2011  . Age-related macular degeneration, dry 10/03/2011  . Disorder of peripheral nervous system 10/03/2011  . Chronic rhinitis 03/03/2011  . Carotid artery narrowing 03/01/2011  . Polypharmacy 12/02/2010    Past Surgical History:  Procedure  Laterality Date  . ABDOMINAL HYSTERECTOMY    . BREAST LUMPECTOMY    . CATARACT EXTRACTION    . CHOLECYSTECTOMY    . JOINT REPLACEMENT Right    Hip  . KNEE SURGERY Right     Allergies 2,4-d dimethylamine (amisol); Celebrex [celecoxib]; Ciprofloxacin; Ibuprofen; Metronidazole; Naproxen; and Other  Social History Social History   Tobacco Use  . Smoking status: Former Research scientist (life sciences)  . Smokeless tobacco: Never Used  . Tobacco comment: smoked in high school for a few years only  Substance Use Topics  . Alcohol use: No    Alcohol/week: 0.0 standard drinks  . Drug use: No   Review of Systems Constitutional: Positive for fever Cardiovascular: Negative for chest pain. Respiratory: Positive for cough and shortness of breath Gastrointestinal: Negative for abdominal pain, vomiting and diarrhea. Musculoskeletal: Negative for back pain. Skin: Negative for rash. Neurological: Negative for headaches, focal weakness or numbness.  All systems negative/normal/unremarkable except as stated in the HPI  ____________________________________________   PHYSICAL EXAM:  VITAL SIGNS: ED Triage Vitals  Enc Vitals Group     BP      Pulse      Resp      Temp      Temp src  SpO2      Weight      Height      Head Circumference      Peak Flow      Pain Score      Pain Loc      Pain Edu?      Excl. in [place]?    Constitutional: Alert and oriented.  Mild distress Eyes: Conjunctivae are normal. Normal extraocular movements. ENT      Head: Normocephalic and atraumatic.      Nose: No congestion/rhinnorhea.      Mouth/Throat: Mucous membranes are moist.      Neck: No stridor. Cardiovascular: Normal rate, regular rhythm. No murmurs, rubs, or gallops. Respiratory: Normal respiratory effort without tachypnea nor retractions.  Mild wheezing bilaterally Gastrointestinal: Soft and nontender. Normal bowel sounds Musculoskeletal: Nontender with normal range of motion in extremities. No lower extremity  tenderness nor edema. Neurologic:  Normal speech and language. No gross focal neurologic deficits are appreciated.  Skin:  Skin is warm, dry and intact. No rash noted. Psychiatric: Mood and affect are normal. Speech and behavior are normal.  ____________________________________________  EKG: Interpreted by me.  Sinus rhythm rate of 74 bpm, prolonged PR interval, normal QRS, normal QT  ____________________________________________  ED COURSE:  As part of my medical decision making, I reviewed the following data within the [place] History obtained from family if available, nursing notes, old chart and ekg, as well as notes from prior ED visits. Patient presented for shortness of breath and cough, we will assess with labs and imaging as indicated at this time. Clinical Course as of May 29 1211  Tue May 28, 2018  1212 Influenza B By PCR(!): POSITIVE [JW]    Clinical Course User Index [JW] Earleen Newport, MD   Procedures ____________________________________________   LABS (pertinent positives/negatives)  Labs Reviewed  CBC WITH DIFFERENTIAL/PLATELET - Abnormal; Notable for the following components:      Result Value   Platelets 123 (*)    Monocytes Absolute 1.2 (*)    All other components within normal limits  BASIC METABOLIC PANEL - Abnormal; Notable for the following components:   Glucose, Bld 100 (*)    Calcium 8.5 (*)    All other components within normal limits  BRAIN NATRIURETIC PEPTIDE - Abnormal; Notable for the following components:   B Natriuretic Peptide 448.0 (*)    All other components within normal limits  BLOOD GAS, VENOUS - Abnormal; Notable for the following components:   pCO2, Ven 41 (*)    All other components within normal limits  INFLUENZA PANEL BY PCR (TYPE A & B) - Abnormal; Notable for the following components:   Influenza B By PCR POSITIVE (*)    All other components within normal limits  TROPONIN I    RADIOLOGY  Chest  x-ray IMPRESSION: 1. Stable cardiomegaly. Can not exclude mild pulmonary vascular congestion and possible small right effusion. 2. Vague opacity at the left costophrenic angle could represent atelectasis or scarring but recommend attention to this area on follow-up chest x-ray. ____________________________________________   DIFFERENTIAL DIAGNOSIS   CHF, COPD, pneumonia, influenza, URI, PE, lung cancer  FINAL ASSESSMENT AND PLAN  Cough, influenza B   Plan: The patient had presented for fever, cough and wheezing. Patient's labs were negative with the exception of positive influenza B testing. Patient's imaging did not reveal any acute process.  I will start her on Tamiflu as well as Tessalon.  She is not needing any supplemental  oxygen.  She is cleared for outpatient follow-up and has declined hospitalization.   Laurence Aly, MD    Note: This note was generated in part or whole with voice recognition software. Voice recognition is usually quite accurate but there are transcription errors that can and very often do occur. I apologize for any typographical errors that were not detected and corrected.     Earleen Newport, MD 05/28/18 1214

## 2018-06-04 ENCOUNTER — Ambulatory Visit (INDEPENDENT_AMBULATORY_CARE_PROVIDER_SITE_OTHER): Payer: Medicare Other | Admitting: Internal Medicine

## 2018-06-04 ENCOUNTER — Encounter: Payer: Self-pay | Admitting: Internal Medicine

## 2018-06-04 VITALS — BP 150/90 | HR 86 | Resp 16 | Ht 66.0 in | Wt 162.0 lb

## 2018-06-04 DIAGNOSIS — J209 Acute bronchitis, unspecified: Secondary | ICD-10-CM

## 2018-06-04 DIAGNOSIS — J101 Influenza due to other identified influenza virus with other respiratory manifestations: Secondary | ICD-10-CM | POA: Diagnosis not present

## 2018-06-04 MED ORDER — PREDNISONE 20 MG PO TABS: 20.0000 mg | ORAL_TABLET | Freq: Every day | ORAL | 0 refills | Status: DC

## 2018-06-04 MED ORDER — IPRATROPIUM-ALBUTEROL 0.5-2.5 (3) MG/3ML IN SOLN
3.0000 mL | Freq: Once | RESPIRATORY_TRACT | Status: AC
  Administered 2018-06-04: 3 mL via RESPIRATORY_TRACT

## 2018-06-04 MED ORDER — AZITHROMYCIN 250 MG PO TABS: ORAL_TABLET | ORAL | 0 refills | Status: DC

## 2018-06-04 MED ORDER — GUAIFENESIN-CODEINE 100-10 MG/5ML PO SOLN: 5.0000 mL | ORAL | 0 refills | Status: DC | PRN

## 2018-06-04 NOTE — Patient Instructions (Addendum)
Start PREDNISONE 20 mg daily for 10 days  Z pak  Robitussin with Codeine as needed for cough

## 2018-06-04 NOTE — Progress Notes (Signed)
PULMONARY OFFICE FOLLOW UP NOTE  Requesting MD/Service: Manuella Ghazi, MD Date of initial consultation: 09/25/16 Reason for consultation: Cough  PT PROFILE: 85 y.o. female never smoker referred for evaluation and mgmt of chronic cough. Wheezing noted on exam. Initial plan: Continue PPI and nasal steroid. Add Breo inhaler  CC cough and chest congestion Acute OV  SUBJ:  Symptoms for last 5 days: +cough +chest congestion +wheezing  +FLU B last week feeling bad Poor appetite +HA  Will give DOUNEB in office today   Vitals:   06/04/18 1338 06/04/18 1352  BP:  (!) 150/90  Pulse:  86  Resp:  16  SpO2:  90%  Weight:  162 lb (73.5 kg)  Height: 5\' 6"  (1.676 m) 5\' 6"  (1.676 m)  RA    Review of Systems:  Gen:  +malaise HEENT: Denies blurred vision, double vision, ear pain, eye pain, hearing loss, nose bleeds, sore throat Cardiac:  No dizziness, chest pain or heaviness, chest tightness,edema, No JVD Resp:   + cough, +sputum production, +shortness of breath,+wheezing, -hemoptysis,  Gi: Denies swallowing difficulty, stomach pain, nausea or vomiting, diarrhea, constipation, bowel incontinence Gu:  Denies bladder incontinence, burning urine Ext:   Denies Joint pain, stiffness or swelling Skin: Denies  skin rash, easy bruising or bleeding or hives Endoc:  Denies polyuria, polydipsia , polyphagia or weight change Psych:   Denies depression, insomnia or hallucinations  Other:  All other systems negative   Physical Examination:   GENERAL:NAD, no fevers, chills, no weakness no fatigue HEAD: Normocephalic, atraumatic.  EYES: Pupils equal, round, reactive to light. Extraocular muscles intact. No scleral icterus.  MOUTH: Moist mucosal membrane. Dentition intact. No abscess noted.  EAR, NOSE, THROAT: Clear without exudates. No external lesions.  NECK: Supple. No thyromegaly. No nodules. No JVD.  PULMONARY: CTA B/L + wheezing,  CARDIOVASCULAR: S1 and S2. Regular rate and rhythm. No murmurs,  rubs, or gallops. No edema. Pedal pulses 2+ bilaterally.  GASTROINTESTINAL: Soft, nontender, nondistended. No masses. Positive bowel sounds. No hepatosplenomegaly.  MUSCULOSKELETAL: No swelling, clubbing, or edema. Range of motion full in all extremities.  NEUROLOGIC: Cranial nerves II through XII are intact. No gross focal neurological deficits. Sensation intact. Reflexes intact.  SKIN: No ulceration, lesions, rashes, or cyanosis. Skin warm and dry. Turgor intact.  PSYCHIATRIC: Mood, affect within normal limits. The patient is awake, alert and oriented x 3. Insight, judgment intact.  ALL OTHER ROS ARE NEGATIVE      DATA:   BMP Latest Ref Rng & Units 05/28/2018 11/15/2017 09/10/2017  Glucose 70 - 99 mg/dL 100(H) 99 114(H)  BUN 8 - 23 mg/dL 17 18 16   Creatinine 0.44 - 1.00 mg/dL 0.83 0.78 0.63  BUN/Creat Ratio 12 - 28 - - -  Sodium 135 - 145 mmol/L 137 142 141  Potassium 3.5 - 5.1 mmol/L 3.9 4.2 3.7  Chloride 98 - 111 mmol/L 105 104 106  CO2 22 - 32 mmol/L 24 31 28   Calcium 8.9 - 10.3 mg/dL 8.5(L) 9.1 8.7(L)    CBC Latest Ref Rng & Units 05/28/2018 09/13/2017 09/10/2017  WBC 4.0 - 10.5 K/uL 7.0 10.9 7.1  Hemoglobin 12.0 - 15.0 g/dL 12.9 12.2 13.0  Hematocrit 36.0 - 46.0 % 39.9 36.0 38.3  Platelets 150 - 400 K/uL 123(L) 187 190    Assessment Acute on chronic bronchitis with INFL B post viral bacterial bronchitis   Plan douneb in office now Prednisone 20 mg daily for 10 days Z pak Robitussin with Codeine Continue inhalers as prescribed  Follow up with Dr Alva Garnet   Patient/Family are satisfied with Plan of action and management. All questions answered  Corrin Parker, M.D.  [facility]  Medical Director [place] Director [facility] Cardio-Pulmonary Department

## 2018-06-04 NOTE — Addendum Note (Signed)
Addended by: Stephanie Coup on: 06/04/2018 02:39 PM   Modules accepted: Orders

## 2018-06-14 ENCOUNTER — Encounter: Payer: Self-pay | Admitting: Internal Medicine

## 2018-06-14 ENCOUNTER — Ambulatory Visit (INDEPENDENT_AMBULATORY_CARE_PROVIDER_SITE_OTHER): Payer: Medicare Other | Admitting: Internal Medicine

## 2018-06-14 VITALS — BP 140/68 | HR 72 | Ht 66.0 in | Wt 162.8 lb

## 2018-06-14 DIAGNOSIS — J209 Acute bronchitis, unspecified: Secondary | ICD-10-CM | POA: Diagnosis not present

## 2018-06-14 MED ORDER — PREDNISONE 20 MG PO TABS: 20.0000 mg | ORAL_TABLET | Freq: Every day | ORAL | 0 refills | Status: DC

## 2018-06-14 MED ORDER — AMOXICILLIN-POT CLAVULANATE 875-125 MG PO TABS: 1.0000 | ORAL_TABLET | Freq: Two times a day (BID) | ORAL | 0 refills | Status: DC

## 2018-06-14 NOTE — Patient Instructions (Addendum)
Prednisone 20 mg daily for 5 days Augmentin 875 mg by mouth twice daily  Continue inhalers as prescribed  Cough syrup as needed

## 2018-06-14 NOTE — Progress Notes (Signed)
PULMONARY OFFICE FOLLOW UP NOTE  Requesting MD/Service: Manuella Ghazi, MD Date of initial consultation: 09/25/16 Reason for consultation: Cough  PT PROFILE: 85 y.o. female never smoker referred for evaluation and mgmt of chronic cough. Wheezing noted on exam. Initial plan: Continue PPI and nasal steroid. Add Breo inhaler  CC  +productive cough  SUBJ:  Acute visit for Feeling bad +productive cough No fevers Wheezing Last day of prednisone    Vitals:   06/14/18 0854  BP: 140/68  Pulse: 72  SpO2: 97%  Weight: 162 lb 12.8 oz (73.8 kg)  Height: 5\' 6"  (1.676 m)  RA    Review of Systems:  Gen: +malaise HEENT: Denies blurred vision, double vision, ear pain, eye pain, hearing loss, nose bleeds, sore throat Cardiac:  No dizziness, chest pain or heaviness, chest tightness,edema, No JVD Resp:   + cough, +sputum production, +shortness of breath,+wheezing, -hemoptysis,  Gi: Denies swallowing difficulty, stomach pain, nausea or vomiting, diarrhea, constipation, bowel incontinence Gu:  Denies bladder incontinence, burning urine Ext:   Denies Joint pain, stiffness or swelling Skin: Denies  skin rash, easy bruising or bleeding or hives Endoc:  Denies polyuria, polydipsia , polyphagia or weight change Psych:   Denies depression, insomnia or hallucinations  Other:  All other systems negative     Physical Examination:   GENERAL:NAD, no fevers, chills, no weakness no fatigue HEAD: Normocephalic, atraumatic.  EYES: PERLA, EOMI No scleral icterus.  MOUTH: Moist mucosal membrane.  EAR, NOSE, THROAT: Clear without exudates. No external lesions.  NECK: Supple. No thyromegaly.  No JVD.  PULMONARY: CTA B/L +wheezing,  CARDIOVASCULAR: S1 and S2. Regular rate and rhythm. No murmurs GASTROINTESTINAL: Soft, nontender, nondistended. Positive bowel sounds.  MUSCULOSKELETAL: No swelling, clubbing, or edema.  NEUROLOGIC: No gross focal neurological deficits. 5/5 strength all extremities SKIN: No  ulceration, lesions, rashes, or cyanosis.  PSYCHIATRIC: Insight, judgment intact. -depression -anxiety ALL OTHER ROS ARE NEGATIVE          DATA:   BMP Latest Ref Rng & Units 05/28/2018 11/15/2017 09/10/2017  Glucose 70 - 99 mg/dL 100(H) 99 114(H)  BUN 8 - 23 mg/dL 17 18 16   Creatinine 0.44 - 1.00 mg/dL 0.83 0.78 0.63  BUN/Creat Ratio 12 - 28 - - -  Sodium 135 - 145 mmol/L 137 142 141  Potassium 3.5 - 5.1 mmol/L 3.9 4.2 3.7  Chloride 98 - 111 mmol/L 105 104 106  CO2 22 - 32 mmol/L 24 31 28   Calcium 8.9 - 10.3 mg/dL 8.5(L) 9.1 8.7(L)    CBC Latest Ref Rng & Units 05/28/2018 09/13/2017 09/10/2017  WBC 4.0 - 10.5 K/uL 7.0 10.9 7.1  Hemoglobin 12.0 - 15.0 g/dL 12.9 12.2 13.0  Hematocrit 36.0 - 46.0 % 39.9 36.0 38.3  Platelets 150 - 400 K/uL 123(L) 187 190    Assessment Acute bronchitis with bacterial infection Previous history of INFL B   Plan  augmentin 875 mg BID Prednisone 20 mg for 5 days Cough syrup with codeine as needed Continue inhalers as needed  Follow up with Dr Alva Garnet    Patient satisfied with Plan of action and management. All questions answered  Corrin Parker, M.D.  [facility]  Medical Director [place] Director [facility] Cardio-Pulmonary Department

## 2018-07-17 ENCOUNTER — Telehealth: Payer: Self-pay | Admitting: Cardiovascular Disease

## 2018-07-17 MED ORDER — FUROSEMIDE 20 MG PO TABS: ORAL_TABLET | ORAL | 0 refills | Status: DC

## 2018-07-17 NOTE — Telephone Encounter (Addendum)
Returned call to [facility]. Unable to lmom Jodi Bullock's voicemail is full.  Spoke with Jodi Aran, RN with home health. Jodi Bullock sts the pt has had a non-productive cough for a month. The patient has bee complaining of sob of breath with exertion for 1 week. Jodi Bullock was out to see the patient today @ [place]. On assessment Jodi Bullock noted fine crackles in the pt right lower lobe. The pt does not have any Furosemide on hand. The pt sts that she was instructed by Jodi Bayley, NP to only take it as needed. Jodi Bullock suggest f/u with the pt.   Spoke with the pt. She confirms worsening DOE. She does weigh her self almost every morning. Her weights range from 160-162lb. Pt reports that her weight this morning was 162lb. Adv pt that I have talked with Jodi Bullock. His instructions are for her to take Furosemide 40mg  today and continue Furosemide 20mg  daily until she is down to her baseline of 160lb. An appt with Jodi Bullock has been scheduled for Fri 07/19/18 @ 12pm.  Pt ask that I speak with the facility Director Jodi Bullock to help coordinate transportation. [name] walked to her office and handed her the phone. Jodi Bullock is aware of the appt date, time, and location. She will coordinate the pt transportation for the appt. Jodi Bullock sts that they will need an order with dosage and instruction for the pt Furosemide. Fax # [phone] attn: Jodi Bullock. Documentation faxed.  The pt rqst that the script for Furosemide be sent to [facility]. Jodi Bullock sts that if it will be a long-term medication it will need to be sent to [facility]. Rx sent to [facility].

## 2018-07-17 NOTE — Telephone Encounter (Signed)
Pt c/o Shortness Of Breath: STAT if SOB developed within the last 24 hours or pt is noticeably SOB on the phone  1. Are you currently SOB (can you hear that pt is SOB on the phone)? Yes per hh nurse   2. How long have you been experiencing SOB?  Cough for a month sob for 1 week   3. Are you SOB when sitting or when up moving around? On exertion   4. Are you currently experiencing any other symptoms? Fine crackles right lower lobe per nurse furosemide dose is unclear please call to discuss

## 2018-07-18 NOTE — Progress Notes (Signed)
Office Visit    Patient Name: Jodi Bullock Date of Encounter: 07/19/2018  Primary Care Provider:  Patient, No Pcp Per Primary Cardiologist:  Kathlyn Sacramento, MD  Chief Complaint    85 y.o. ? with a h/o NICM, HFmEF (EF 40-45%), HTN, HL, thymus cancer, remote breast cancer, and recent fx of R femur, who presents for f/u related to a 3 wk h/o dyspnea and lower ext edema.  Past Medical History    Past Medical History:  Diagnosis Date  . Arthritis   . Bladder incontinence   . Breast cancer ([place]) 1998  . Chronic systolic CHF (congestive heart failure) ([place]) 01/10/2016   a. 09/2015 Echo: EF 40-45%, possible inf HK, mod PAH (PASP 40mmHg).  . Closed fracture of right femur ([place])    a. 08/2017->Managed conservatively.  . Depression   . GERD (gastroesophageal reflux disease)   . History of kidney stones   . HLD (hyperlipidemia)   . Hypertension   . Neuropathy of both feet   . NICM (nonischemic cardiomyopathy) ([place])    a. 08/2015 Lexiscan MV: EF 39%, no ischemia; b. 09/2015 Echo: EF 40-45%.  . Right Humerus head fracture    a. 07/2016 - managed conservatively.  . Thymus cancer [facility])    cancer   Past Surgical History:  Procedure Laterality Date  . ABDOMINAL HYSTERECTOMY    . BREAST LUMPECTOMY    . CATARACT EXTRACTION    . CHOLECYSTECTOMY    . JOINT REPLACEMENT Right    Hip  . KNEE SURGERY Right     Allergies  Allergies  Allergen Reactions  . 2,4-D Dimethylamine (Amisol) Other (See Comments)    Other Reaction: OTHER REACTION-IRRITATION TO Esophagous  . Celebrex [Celecoxib] Other (See Comments)     esophagitis Esophageal spasms    . Ciprofloxacin Other (See Comments)    Esophageal irritation  . Ibuprofen     Other reaction(s): Other (See Comments) reflux  . Metronidazole Other (See Comments)    neuropathy  . Naproxen Other (See Comments)    GI UPSET  . Other Other (See Comments)    Other Reaction: esophagitis    History of Present Illness    85 y.o. female with  the above complex past medical history including hypertension, hyperlipidemia, GERD, nonischemic cardiomyopathy, HFmEF (EF 40-45%), thymus cancer, and remote breast cancer.  Cardiac history dates back to April 2017 when she was evaluated with exertional dyspnea. Stress testing showed an EF of 39% without ischemia. Follow-up echocardiography showed an EF of 40 to 45% with possible inferior hypokinesis. Medical therapy was initiated including beta-blocker and ARB. She has been on lasix prn, but tries to take this as infrequently as possible b/c of frequent urination and sleep disruption.  She moved to assisted living w/ her husband in the fall 2019 and was last seen here in clinic in December, at which time she was doing well.  Over the last 2-3 weeks she has been experiencing increased weakness and fatigue.  She has noticed a significant decline in exercise tolerance.  Assocaited symptoms include SOB and edema. She was seen by home health and they were concerned about her weight gain. They called the office on 07/17/2018 and I recommended Lasix 40 mg x 1 and then 20 mg daily until weight returns to baseline.  Her symptoms have improved slightly following 3 doses of Lasix, but she reports she is still not back to baseline, adding she used to be able to walk around [place] without experiencing SOB  or fatigue. She denies dark stools, history of bleeding, chest pain, palpitations, dyspnea, orthopnea, n, v, dizziness, syncope, weight gain, or early satiety.  She is in new onset, rate controlled atrial flutter today.  Home Medications    Prior to Admission medications   Medication Sig Start Date End Date Taking? Authorizing Provider  acetaminophen (TYLENOL) 650 MG CR tablet Take 650 mg by mouth 2 (two) times daily.    [provider]  amoxicillin-clavulanate (AUGMENTIN) 875-125 MG tablet Take 1 tablet by mouth 2 (two) times daily. 06/14/18   Flora Lipps, MD  aspirin EC 81 MG tablet Take 1 tablet (81 mg  total) by mouth daily. 11/08/17   Theora Gianotti, NP  benzonatate (TESSALON) 200 MG capsule Take 1 capsule (200 mg total) by mouth 3 (three) times daily as needed for cough. 05/28/18   Earleen Newport, MD  carvedilol (COREG) 12.5 MG tablet Take 1.5 tablets (18.75 mg total) by mouth 2 (two) times daily. 02/22/18 05/23/18  Theora Gianotti, NP  Cholecalciferol 2000 UNITS TABS Take 1 tablet by mouth daily.    Roselee Nova, MD  Cyanocobalamin (VITAMIN DEFICIENCY SYSTEM-B12 IJ) Inject as directed every 30 (thirty) days.    [provider]  docusate sodium (COLACE) 100 MG capsule Take 100 mg by mouth daily.    [provider]  fluticasone (FLONASE) 50 MCG/ACT nasal spray Place 1 spray into both nostrils daily. 08/30/17   Wilhelmina Mcardle, MD  Fluticasone Furoate (ARNUITY ELLIPTA) 100 MCG/ACT AEPB Inhale 1 puff into the lungs daily. 08/30/17   Wilhelmina Mcardle, MD  furosemide (LASIX) 20 MG tablet Take 40mg  daily starting 07/17/18. Then take 20mg  daily as needed. 07/17/18   Theora Gianotti, NP  guaiFENesin (MUCINEX) 600 MG 12 hr tablet Take by mouth 2 (two) times daily.    [provider]  guaiFENesin-codeine 100-10 MG/5ML syrup Take 5 mLs by mouth every 4 (four) hours as needed for cough. 06/04/18   Flora Lipps, MD  loperamide (IMODIUM A-D) 2 MG tablet Take 2 mg by mouth as needed for diarrhea or loose stools.    [provider]  loratadine (CLARITIN) 10 MG tablet Take 1 tablet (10 mg total) by mouth at bedtime. 08/30/17 08/30/18  Wilhelmina Mcardle, MD  LORazepam (ATIVAN) 0.5 MG tablet Take 1 tablet (0.5 mg total) by mouth 2 (two) times daily as needed for anxiety. 05/23/17   Roselee Nova, MD  losartan (COZAAR) 100 MG tablet Take 1 tablet (100 mg total) by mouth daily. 05/23/17   Roselee Nova, MD  lovastatin (MEVACOR) 40 MG tablet Take 1 tablet (40 mg total) by mouth daily. Patient taking differently: Take 40 mg by mouth daily. Pt takes 3  times weekly 05/23/17   Roselee Nova, MD  Multiple Vitamins-Minerals (PRESERVISION/LUTEIN) CAPS Take 1 each by mouth 2 (two) times daily.    [provider]  omeprazole (PRILOSEC) 40 MG capsule Take 40 mg by mouth daily.    [provider]  polyethylene glycol powder (GLYCOLAX/MIRALAX) powder 17 grams a day 10/22/09   [provider]  predniSONE (DELTASONE) 20 MG tablet Take 1 tablet (20 mg total) by mouth daily with breakfast. 10 days 06/04/18   Flora Lipps, MD  predniSONE (DELTASONE) 20 MG tablet Take 1 tablet (20 mg total) by mouth daily. 06/14/18 06/14/19  Flora Lipps, MD  traMADol (ULTRAM) 50 MG tablet Take by mouth.    [provider]  trimethoprim (  TRIMPEX) 100 MG tablet Take 1 tablet (100 mg total) by mouth daily. 03/04/18   Bjorn Loser, MD    Review of Systems    As above, she has been experiencing easy fatigability, dyspnea on exertion, and some increase in lower extremity swelling.  No chest pain or palpitations.  She denies dark stools, history of bleeding, orthopnea, n, v, dizziness, syncope, or early satiety. All other systems reviewed and are otherwise negative except as noted above.  Physical Exam    VS:  BP 130/68 (BP Location: Left Arm, Patient Position: Sitting, Cuff Size: Normal)   Ht 5\' 6"  (1.676 m)   Wt 160 lb (72.6 kg)   BMI 25.82 kg/m  , BMI Body mass index is 25.82 kg/m. GEN: Well nourished, well developed, in no acute distress. HEENT: normal. Neck: Supple, no JVD, carotid bruits, or masses. Cardiac: RRR, no murmurs, rubs, or gallops. No clubbing, cyanosis. Trace edema.  Radials/DP/PT 2+ and equal bilaterally.  Respiratory:  Respirations regular and unlabored, clear to auscultation bilaterally. GI: Soft, nontender, nondistended, BS + x 4. MS: no deformity or atrophy. Skin: warm and dry, no rash. Neuro:  Strength and sensation are intact. Psych: Normal affect.  Accessory Clinical Findings    ECG personally reviewed  by me today -atrial flutter with variable conduction, 71- no acute changes.  Assessment & Plan    1.  New onset atrial flutter: Patient presents with a 2 to 3-week history of easy fatigability, reduced exercise tolerance, dyspnea on exertion, and mild lower extremity swelling.  She has been found to be in rate controlled atrial flutter today at a rate of 71.  She has been using Lasix over the past 3 days with good response and 2 to 3 pound weight loss.  Weight is now back to baseline.  She has only trace lower extremity edema on exam.  As noted, she is rate controlled and already on beta-blocker therapy.  CHA2DS2VASc equals 5.  She does not have a prior history of significant bleeding or falls.  I will initiate Eliquis 5 mg twice daily today (weight and creatinine appropriate for this dose).  I will also follow-up CBC, basic metabolic panel, and TSH today.  I will arrange for an echocardiogram to take place on the same day as a follow-up visit in 2 to 3 weeks, so that we can reassess rhythm and likely plan for cardioversion.   2.  Acute on chronic systolic heart failure/nonischemic cardiomyopathy: Likely in the setting of above over the past 2 to 3 weeks, patient has had an increase in lower extremity swelling which improved after resuming Lasix.  She took 40 mg x 1 2 days ago and 20 mg daily that each of the past 2 days.  She is now taking this at 4 AM when she first awakes and this has been more tolerable for her.  She has only trace lower extremity edema on examination today and otherwise looks pretty good.  Breathing has improved since resuming Lasix.  As above, I think restoring sinus rhythm may be very important in helping to manage her heart failure symptoms.  I will follow-up lab work today and arrange for repeat echo as it has been several years since her last echo.  3.  Essential hypertension: Stable.  4.  Hyperlipidemia: She remains on lovastatin therapy with an LDL of 67 in January 2019.  5.   Disposition: Follow-up lab work today.  Plan to follow-up echo on the same day as next office  visit as transportation is very difficult for her.  Pending rhythm assessment and symptoms at that point, we can discuss cardioversion further.  I, Margit Banda am acting as a Education administrator for Murray Hodgkins, NP.   I have reviewed the above documentation for accuracy and completeness, and I agree with the above.   Murray Hodgkins, NP 07/19/2018, 1:36 PM

## 2018-07-19 ENCOUNTER — Ambulatory Visit (INDEPENDENT_AMBULATORY_CARE_PROVIDER_SITE_OTHER): Payer: Medicare Other | Admitting: Nurse Practitioner

## 2018-07-19 ENCOUNTER — Encounter: Payer: Self-pay | Admitting: Nurse Practitioner

## 2018-07-19 VITALS — BP 130/68 | Ht 66.0 in | Wt 160.0 lb

## 2018-07-19 DIAGNOSIS — I4892 Unspecified atrial flutter: Secondary | ICD-10-CM

## 2018-07-19 DIAGNOSIS — I5022 Chronic systolic (congestive) heart failure: Secondary | ICD-10-CM | POA: Diagnosis not present

## 2018-07-19 DIAGNOSIS — I428 Other cardiomyopathies: Secondary | ICD-10-CM | POA: Diagnosis not present

## 2018-07-19 DIAGNOSIS — I10 Essential (primary) hypertension: Secondary | ICD-10-CM

## 2018-07-19 DIAGNOSIS — E782 Mixed hyperlipidemia: Secondary | ICD-10-CM

## 2018-07-19 DIAGNOSIS — I5023 Acute on chronic systolic (congestive) heart failure: Secondary | ICD-10-CM | POA: Diagnosis not present

## 2018-07-19 MED ORDER — APIXABAN 5 MG PO TABS: 5.0000 mg | ORAL_TABLET | Freq: Two times a day (BID) | ORAL | 11 refills | Status: DC

## 2018-07-19 NOTE — Patient Instructions (Signed)
Medication Instructions:  Your physician has recommended you make the following change in your medication:  1- STOP Aspirin  2- START Eliquis Take 1 tablet (5 mg total) by mouth 2 (two) times daily.  If you need a refill on your cardiac medications before your next appointment, please call your pharmacy.   Lab work: Your physician recommends that you have lab work today(CBC, BMET, TSH)  If you have labs (blood work) drawn today and your tests are completely normal, you will receive your results only by: Marland Kitchen MyChart Message (if you have MyChart) OR . A paper copy in the mail If you have any lab test that is abnormal or we need to change your treatment, we will call you to review the results.  Testing/Procedures: 1- ECHO Echo  Please return to [facility] on ______________ at _______________ AM/PM for an Echocardiogram. Your physician has requested that you have an echocardiogram. Echocardiography is a painless test that uses sound waves to create images of your heart. It provides your doctor with information about the size and shape of your heart and how well your heart's chambers and valves are working. This procedure takes approximately one hour. There are no restrictions for this procedure. Please note; depending on visual quality an IV may need to be placed.    Follow-Up: At [facility], you and your health needs are our priority.  As part of our continuing mission to provide you with exceptional heart care, we have created designated Provider Care Teams.  These Care Teams include your primary Cardiologist (physician) and Advanced Practice Providers (APPs -  Physician Assistants and Nurse Practitioners) who all work together to provide you with the care you need, when you need it. You will need a follow up appointment in 3 weeks.   You may see Kathlyn Sacramento, MD (preferred) or Murray Hodgkins, NP

## 2018-07-20 LAB — BASIC METABOLIC PANEL
BUN/Creatinine Ratio: 17 (ref 12–28)
BUN: 17 mg/dL (ref 10–36)
CO2: 25 mmol/L (ref 20–29)
CREATININE: 1.02 mg/dL — AB (ref 0.57–1.00)
Calcium: 9.5 mg/dL (ref 8.7–10.3)
Chloride: 103 mmol/L (ref 96–106)
GFR calc Af Amer: 56 mL/min/{1.73_m2} — ABNORMAL LOW (ref >59)
GFR calc non Af Amer: 48 mL/min/{1.73_m2} — ABNORMAL LOW (ref >59)
Glucose: 106 mg/dL — ABNORMAL HIGH (ref 65–99)
Potassium: 5.2 mmol/L (ref 3.5–5.2)
SODIUM: 143 mmol/L (ref 134–144)

## 2018-07-20 LAB — CBC
Hematocrit: 38.2 % (ref 34.0–46.6)
Hemoglobin: 12.8 g/dL (ref 11.1–15.9)
MCH: 30.9 pg (ref 26.6–33.0)
MCHC: 33.5 g/dL (ref 31.5–35.7)
MCV: 92 fL (ref 79–97)
Platelets: 274 10*3/uL (ref 150–450)
RBC: 4.14 x10E6/uL (ref 3.77–5.28)
RDW: 12 % (ref 11.7–15.4)
WBC: 8.7 10*3/uL (ref 3.4–10.8)

## 2018-07-20 LAB — TSH: TSH: 0.622 u[IU]/mL (ref 0.450–4.500)

## 2018-07-22 ENCOUNTER — Telehealth: Payer: Self-pay

## 2018-07-22 NOTE — Telephone Encounter (Signed)
-----   Message from Theora Gianotti, NP sent at 07/22/2018  8:46 AM EDT ----- Blood counts, lytes, and tsh wnl.  Creat very mildly elevated - ensure good hydration.  Nothing in labs to account for development of atrial flutter.

## 2018-07-22 NOTE — Telephone Encounter (Signed)
Called pt to review labs with recommendation from provider. Detailed message left, okay per DPR as well as message sent using mychart. Encounter completed at this time. Encouraged to call back with any further questions of concerns.

## 2018-07-31 ENCOUNTER — Telehealth: Payer: Self-pay

## 2018-07-31 DIAGNOSIS — I4892 Unspecified atrial flutter: Secondary | ICD-10-CM

## 2018-07-31 NOTE — Telephone Encounter (Signed)
Mikle Bosworth, case manager calling from living facility.   Facility unable to bring patient at this date/time for appt and Echo.   She also reports feeling fatigue. She is on lasix but not currently taking K Cl.   I will send in order from provider for lab to be drawn tomorrow at center. Order faxed to facility.   Routed to pool for rescheduling.

## 2018-08-06 ENCOUNTER — Other Ambulatory Visit: Payer: Self-pay

## 2018-08-06 ENCOUNTER — Telehealth (INDEPENDENT_AMBULATORY_CARE_PROVIDER_SITE_OTHER): Payer: Medicare Other | Admitting: Cardiovascular Disease

## 2018-08-06 DIAGNOSIS — I4892 Unspecified atrial flutter: Secondary | ICD-10-CM

## 2018-08-06 DIAGNOSIS — I5022 Chronic systolic (congestive) heart failure: Secondary | ICD-10-CM | POA: Diagnosis not present

## 2018-08-06 MED ORDER — FUROSEMIDE 20 MG PO TABS: 20.0000 mg | ORAL_TABLET | Freq: Every day | ORAL | 3 refills | Status: DC

## 2018-08-06 NOTE — Progress Notes (Signed)
Telephone Visit     Evaluation Performed:  Follow-up visit  This visit type was conducted due to national recommendations for restrictions regarding the COVID-19 Pandemic (e.g. social distancing).  This format is felt to be most appropriate for this patient at this time.  All issues noted in this document were discussed and addressed.  No physical exam was performed (except for noted visual exam findings with Telehealth visits).  See MyChart message from today for the patient's consent to telehealth for [facility].  Date:  08/06/2018   ID:  Jodi Bullock, DOB [birth year], MRN [id number]  Patient Location:  [place] [place] [address]   Provider location:   [facility] [place]  PCP:  Patient, No Pcp Per  Cardiologist:  Kathlyn Sacramento, MD  Electrophysiologist:  None   Chief Complaint: Shortness of breath  History of Present Illness:    Jodi Bullock is a 85 y.o. female who presents via audio/video conferencing for a telehealth visit today.  She has known history of chronic systolic heart failure and recent diagnosis of atrial flutter.  She was seen by Ignacia Bayley and was started on Eliquis and furosemide.  She actually reports significant improvement in shortness of breath and no palpitations.  She was supposed to get a follow-up echo and labs but has not been able to do so due to the circumstances. She has no fever or cough.  The patient does not symptoms concerning for COVID-19 infection (fever, chills, cough, or new SHORTNESS OF BREATH).    Prior CV studies:   The following studies were reviewed today:  She was supposed to get an echocardiogram and labs but these were not done.  Past Medical History:  Diagnosis Date  . Arthritis   . Atrial flutter ([place])    a. Dx 07/2018. CHA2DS2VASc = 5-->Eliquis.  . Bladder incontinence   . Breast cancer ([place]) 1998  . Chronic systolic CHF (congestive heart failure) ([place]) 01/10/2016   a. 09/2015  Echo: EF 40-45%, possible inf HK, mod PAH (PASP 73mmHg).  . Closed fracture of right femur ([place])    a. 08/2017->Managed conservatively.  . Depression   . GERD (gastroesophageal reflux disease)   . History of kidney stones   . HLD (hyperlipidemia)   . Hypertension   . Neuropathy of both feet   . NICM (nonischemic cardiomyopathy) ([place])    a. 08/2015 Lexiscan MV: EF 39%, no ischemia; b. 09/2015 Echo: EF 40-45%.  . Right Humerus head fracture    a. 07/2016 - managed conservatively.  . 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 ABDOMINAL HYSTERECTOMY    . BREAST LUMPECTOMY    . CATARACT EXTRACTION    . CHOLECYSTECTOMY    . JOINT REPLACEMENT Right    Hip  . KNEE SURGERY Right      No outpatient medications have been marked as taking for the 08/06/18 encounter (Telemedicine) with Wellington Hampshire, MD.     Allergies:   2,4-d dimethylamine (amisol); Celebrex [celecoxib]; Ciprofloxacin; Ibuprofen; Metronidazole; Naproxen; and Other   Social History   Tobacco Use  . Smoking status: Former Research scientist (life sciences)  . Smokeless tobacco: Never Used  . Tobacco comment: smoked in high school for a few years only  Substance Use Topics  . Alcohol use: No    Alcohol/week: 0.0 standard drinks  . Drug use: No     Family Hx: The patient's family history includes Diabetes  in her father; Heart disease in her father; Stroke in her mother. There is no history of Kidney disease, Bladder Cancer, or Kidney cancer.  ROS:   Please see the history of present illness.     All other systems reviewed and are negative.   Labs/Other Tests and Data Reviewed:    Recent Labs: 09/09/2017: ALT 45 05/28/2018: B Natriuretic Peptide 448.0 07/19/2018: BUN 17; Creatinine, Ser 1.02; Hemoglobin 12.8; Platelets 274; Potassium 5.2; Sodium 143; TSH 0.622   Recent Lipid Panel Lab Results  Component Value Date/Time   CHOL 139 05/24/2017 10:49 AM   CHOL 131 06/04/2015 08:10 AM   TRIG 163  (H) 05/24/2017 10:49 AM   HDL 48 (L) 05/24/2017 10:49 AM   HDL 54 06/04/2015 08:10 AM   CHOLHDL 2.9 05/24/2017 10:49 AM   LDLCALC 67 05/24/2017 10:49 AM    Wt Readings from Last 3 Encounters:  07/19/18 160 lb (72.6 kg)  06/14/18 162 lb 12.8 oz (73.8 kg)  06/04/18 162 lb (73.5 kg)     Exam:    Vital Signs:  There were no vitals taken for this visit.    ASSESSMENT & PLAN:    1.  Atrial flutter: She reports improvement in shortness of breath after adjusting her medications and starting furosemide.  Given improvement in her symptoms and considering her age, we can likely hold off on cardioversion for now.  Continue rate control with carvedilol and anticoagulation with Eliquis.  She reports no side effects.  2.  Chronic systolic heart failure: She reports significant improvement in dyspnea.  I refilled furosemide at 20 mg once daily.  COVID-19 Education: The signs and symptoms of COVID-19 were discussed with the patient and how to seek care for testing (follow up with PCP or arrange E-visit).  The importance of social distancing was discussed today.  Patient Risk:   After full review of this patients clinical status, I feel that they are at least moderate risk at this time.  Time:   Today, I have spent 20 minutes with the patient with telehealth technology discussing the above.     Medication Adjustments/Labs and Tests Ordered: Current medicines are reviewed at length with the patient today.  Concerns regarding medicines are outlined above.  Tests Ordered: No orders of the defined types were placed in this encounter.  Medication Changes: Meds ordered this encounter  Medications  . furosemide (LASIX) 20 MG tablet    Sig: Take 1 tablet (20 mg total) by mouth daily.    Dispense:  30 tablet    Refill:  3    Disposition:  in 1 month(s)  Signed, Kathlyn Sacramento, MD  08/06/2018 2:37 PM    [place]

## 2018-08-09 ENCOUNTER — Ambulatory Visit: Payer: Medicare Other | Admitting: Cardiovascular Disease

## 2018-08-09 ENCOUNTER — Other Ambulatory Visit: Payer: Medicare Other

## 2018-08-13 NOTE — Progress Notes (Signed)
Scheduled evisit with [name] 5-6 at 1030

## 2018-08-21 ENCOUNTER — Telehealth: Payer: Self-pay | Admitting: Cardiovascular Disease

## 2018-08-21 MED ORDER — FUROSEMIDE 20 MG PO TABS: 20.0000 mg | ORAL_TABLET | ORAL | 3 refills | Status: DC

## 2018-08-21 NOTE — Telephone Encounter (Signed)
Spoke with the patient. She stated that she has had to use the restroom every 30 minutes while being on the furosemide 20 mg daily. She takes the furosemide at 4 am in the morning but the urination frequency last all day. This has become very hard for her. She would like to know if this can be decreased or discontinued for a while. She did not take it this morning.   She denies increased shortness of breath and swelling.

## 2018-08-21 NOTE — Telephone Encounter (Signed)
Please call to discuss Lasix. States she has been going to the bathroom a lot

## 2018-08-21 NOTE — Telephone Encounter (Signed)
Called patient back. She verbalized understanding of Dr Tyrell Antonio recommendations to hold furosemide for now, monitor symptoms and resume at furosemide 20 mg every other day if shortness of breath and swelling in legs returns.

## 2018-08-21 NOTE — Telephone Encounter (Signed)
She can hold furosemide for now and monitor her symptoms.  If she develops recurrent shortness of breath or swelling in her legs, she should resume furosemide 20 mg to be taken every other day.

## 2018-09-09 ENCOUNTER — Other Ambulatory Visit: Payer: Medicare Other

## 2018-09-10 ENCOUNTER — Telehealth: Payer: Self-pay

## 2018-09-10 NOTE — Telephone Encounter (Signed)
Pt appt changed from 5/6 to 5/7 d/t CV19 scheduling issues. Pt agreeable.   Advised pt to call for any further questions or concerns.

## 2018-09-18 ENCOUNTER — Telehealth: Payer: Medicare Other | Admitting: Nurse Practitioner

## 2018-09-19 ENCOUNTER — Telehealth (INDEPENDENT_AMBULATORY_CARE_PROVIDER_SITE_OTHER): Payer: Medicare Other | Admitting: Nurse Practitioner

## 2018-09-19 ENCOUNTER — Other Ambulatory Visit: Payer: Self-pay

## 2018-09-19 ENCOUNTER — Encounter: Payer: Self-pay | Admitting: Nurse Practitioner

## 2018-09-19 VITALS — BP 116/64 | Ht 66.0 in | Wt 159.3 lb

## 2018-09-19 DIAGNOSIS — I5022 Chronic systolic (congestive) heart failure: Secondary | ICD-10-CM

## 2018-09-19 DIAGNOSIS — I428 Other cardiomyopathies: Secondary | ICD-10-CM

## 2018-09-19 DIAGNOSIS — I10 Essential (primary) hypertension: Secondary | ICD-10-CM

## 2018-09-19 DIAGNOSIS — I483 Typical atrial flutter: Secondary | ICD-10-CM

## 2018-09-19 DIAGNOSIS — E782 Mixed hyperlipidemia: Secondary | ICD-10-CM

## 2018-09-19 NOTE — Progress Notes (Signed)
Virtual Visit via Telephone Note   This visit type was conducted due to national recommendations for restrictions regarding the COVID-19 Pandemic (e.g. social distancing) in an effort to limit this patient's exposure and mitigate transmission in our community.  Due to her co-morbid illnesses, this patient is at least at moderate risk for complications without adequate follow up.  This format is felt to be most appropriate for this patient at this time.  The patient did not have access to video technology/had technical difficulties with video requiring transitioning to audio format only (telephone).  All issues noted in this document were discussed and addressed.  No physical exam could be performed with this format.  Please refer to the patient's chart for her  consent to telehealth for [facility]. Evaluation Performed:  Follow-up visit  This visit type was conducted due to national recommendations for restrictions regarding the COVID-19 Pandemic (e.g. social distancing).  This format is felt to be most appropriate for this patient at this time.  All issues noted in this document were discussed and addressed.  No physical exam was performed (except for noted visual exam findings with Video Visits).  Please refer to the patient's chart (MyChart message for video visits and phone note for telephone visits) for the patient's consent to telehealth for [facility]. _____________   Date:  09/19/2018   Patient ID:  Jodi Bullock, DOB 1925-05-07, MRN [id number] Patient Location:  home Provider location:   office  Primary Care Provider:  Patient, No Pcp Per Primary Cardiologist:  Kathlyn Sacramento, MD  Chief Complaint    85 year old female with a history of nonischemic cardiomyopathy, HFmEF (EF 40-45%), hypertension, hyperlipidemia, thymus cancer, remote breast cancer, and right femur fracture, who presents for follow-up related to atrial flutter.  Past Medical History    Past Medical History:   Diagnosis Date  . Arthritis   . Atrial flutter ([place])    a. Dx 07/2018. CHA2DS2VASc = 5-->Eliquis.  . Bladder incontinence   . Breast cancer ([place]) 1998  . Chronic systolic CHF (congestive heart failure) ([place]) 01/10/2016   a. 09/2015 Echo: EF 40-45%, possible inf HK, mod PAH (PASP 47mmHg).  . Closed fracture of right femur ([place])    a. 08/2017->Managed conservatively.  . Depression   . GERD (gastroesophageal reflux disease)   . History of kidney stones   . HLD (hyperlipidemia)   . Hypertension   . Neuropathy of both feet   . NICM (nonischemic cardiomyopathy) ([place])    a. 08/2015 Lexiscan MV: EF 39%, no ischemia; b. 09/2015 Echo: EF 40-45%.  . Right Humerus head fracture    a. 07/2016 - managed conservatively.  . 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 ABDOMINAL HYSTERECTOMY    . BREAST LUMPECTOMY    . CATARACT EXTRACTION    . CHOLECYSTECTOMY    . JOINT REPLACEMENT Right    Hip  . KNEE SURGERY Right     Allergies  Allergies  Allergen Reactions  . 2,4-D Dimethylamine (Amisol) Other (See Comments)    Other Reaction: OTHER REACTION-IRRITATION TO Esophagous  . Celebrex [Celecoxib] Other (See Comments)     esophagitis Esophageal spasms    . Ciprofloxacin Other (See Comments)    Esophageal irritation  . Ibuprofen     Other reaction(s): Other (See Comments) reflux  . Metronidazole Other (See Comments)    neuropathy  . Naproxen Other (See Comments)    GI UPSET  . Other Other (  See Comments)    Other Reaction: esophagitis    History of Present Illness    Jodi Bullock is a 85 y.o. female who presents via audio/video conferencing for a telehealth visit today.  She does not have access to a video source and therefore, this was completed as a phone visit.  As above, she has a complex past medical history including hypertension, hyperlipidemia, GERD, nonischemic cardiomyopathy, HFmEF (EF 40-45%), thymus cancer, and remote breast cancer.   Cardiac history dates back to April 2017, when she was evaluated with exertional dyspnea.  Stress testing showed an EF of 39% without ischemia.  Follow-up echocardiography showed an EF of 40 to 45% with possible inferior hypokinesis.  Medical therapy was initiated including beta-blocker and ARB.  She uses Lasix on an as-needed basis at this point, as she has polyuria which is quite bothersome.  She and her husband live at [place] since the fall 2019.  I saw her in clinic in March of this year given a 2 to 3-week history of worsening weakness, fatigue, dyspnea, and edema.  She was found to be in rate controlled atrial flutter.  With a CHA2DS2VASc of 5, she was provided with and lab work was evaluated and stable.  She was advised to continue Lasix 20 mg daily.  Echocardiogram was ordered but then subsequently deferred secondary to COVID-19.  When she spoke with Dr. Fletcher Anon for a phone visit on March 24, she was feeling significantly better and conservative management was recommended.  Since her last virtual visit, she is continued to do well.  She did call a few weeks ago to ask if she could stop taking Lasix as frequently due to polyuria and nocturia.  She initially cut back to every other day but then noticed some mild edema and then resume at 20 mg daily and right now is tolerating that just fine.  She has not had any palpitations, chest pain, dyspnea, PND, orthopnea, dizziness, syncope, or early satiety.  She has been concerned about her husband who has gained weight and has had more dyspnea on exertion.  The patient does not have symptoms concerning for COVID-19 infection (fever, chills, cough, or new shortness of breath).   Home Medications    Prior to Admission medications   Medication Sig Start Date End Date Taking? Authorizing Provider  acetaminophen (TYLENOL) 650 MG CR tablet Take 650 mg by mouth every 8 (eight) hours.    Yes [provider]  apixaban (ELIQUIS) 5 MG TABS tablet Take  1 tablet (5 mg total) by mouth 2 (two) times daily. 07/19/18  Yes Theora Gianotti, NP  carvedilol (COREG) 12.5 MG tablet Take 1.5 tablets (18.75 mg total) by mouth 2 (two) times daily. 02/22/18 09/19/27 Yes Theora Gianotti, NP  Cholecalciferol 2000 UNITS TABS Take 1 tablet by mouth daily.   Yes Keith Rake Asad A, MD  docusate sodium (COLACE) 100 MG capsule Take 100 mg by mouth daily.   Yes [provider]  fluticasone (FLONASE) 50 MCG/ACT nasal spray Place 1 spray into both nostrils daily. 08/30/17  Yes Wilhelmina Mcardle, MD  Fluticasone Furoate (ARNUITY ELLIPTA) 100 MCG/ACT AEPB Inhale 1 puff into the lungs daily. 08/30/17  Yes Wilhelmina Mcardle, MD  furosemide (LASIX) 20 MG tablet Take 20 mg by mouth daily.   Yes [provider]  guaiFENesin (MUCINEX) 600 MG 12 hr tablet Take by mouth 2 (two) times daily.   Yes [provider]  loperamide (IMODIUM A-D) 2 MG  tablet Take 2 mg by mouth as needed for diarrhea or loose stools.   Yes [provider]  loratadine (CLARITIN) 10 MG tablet Take 1 tablet (10 mg total) by mouth at bedtime. 08/30/17 09/19/27 Yes Wilhelmina Mcardle, MD  LORazepam (ATIVAN) 0.5 MG tablet Take 1 tablet (0.5 mg total) by mouth 2 (two) times daily as needed for anxiety. 05/23/17  Yes Roselee Nova, MD  losartan (COZAAR) 100 MG tablet Take 1 tablet (100 mg total) by mouth daily. 05/23/17  Yes Roselee Nova, MD  lovastatin (MEVACOR) 40 MG tablet Take 1 tablet (40 mg total) by mouth daily. Patient taking differently: Take 40 mg by mouth daily. Pt takes 3 times weekly 05/23/17  Yes Roselee Nova, MD  Multiple Vitamins-Minerals (PRESERVISION/LUTEIN) CAPS Take 1 each by mouth 2 (two) times daily.   Yes [provider]  polyethylene glycol powder (GLYCOLAX/MIRALAX) powder 17 grams a day 10/22/09  Yes [provider]  Saccharomyces boulardii (PROBIOTIC) 250 MG CAPS Take by mouth daily.   Yes [provider]  traMADol  (ULTRAM) 50 MG tablet Take 50 mg by mouth as needed.    Yes [provider]  trimethoprim (TRIMPEX) 100 MG tablet Take 1 tablet (100 mg total) by mouth daily. 03/04/18  Yes MacDiarmid, Nicki Reaper, MD  Cyanocobalamin (VITAMIN DEFICIENCY SYSTEM-B12 IJ) Inject as directed every 30 (thirty) days.    [provider]  omeprazole (PRILOSEC) 40 MG capsule Take 40 mg by mouth daily. Out of med-needs to get OTC from pharmacy    [provider]    Review of Systems    She has been stable over the past 2 months without chest pain, dyspnea, palpitations, PND, orthopnea, dizziness, syncope, or early satiety.  She did have mild lower extremity swelling after cutting back her Lasix dose and has since gone back up to 20 mg daily.  All other systems reviewed and are otherwise negative except as noted above.  Physical Exam    Vital Signs:  BP 116/64 Comment: 09/10/2018  Ht 5\' 6"  (1.676 m)   Wt 159 lb 5 oz (72.3 kg)   BMI 25.71 kg/m    Examination limited secondary to phone visit.  She is awake alert and oriented x3.  She is hard of hearing.  She is in no acute distress and respirations are regular and unlabored.  Accessory Clinical Findings    Lab Results  Component Value Date   CREATININE 1.02 (H) 07/19/2018   BUN 17 07/19/2018   NA 143 07/19/2018   K 5.2 07/19/2018   CL 103 07/19/2018   CO2 25 07/19/2018    Lab Results  Component Value Date   WBC 8.7 07/19/2018   HGB 12.8 07/19/2018   HCT 38.2 07/19/2018   MCV 92 07/19/2018   PLT 274 07/19/2018   Lab Results  Component Value Date   TSH 0.622 07/19/2018    Lab Results  Component Value Date   CHOL 139 05/24/2017   HDL 48 (L) 05/24/2017   LDLCALC 67 05/24/2017   TRIG 163 (H) 05/24/2017   CHOLHDL 2.9 05/24/2017     Assessment & Plan    1.  Atrial flutter: This was discovered on March 6 in the setting of a several week history of dyspnea and fatigue.  Lab work at that time was unremarkable.  She is rate controlled  and now on Eliquis in the setting of a CHA2DS2VASc of 5.  With low-dose diuretic therapy, symptoms of  fatigue and dyspnea have resolved.  We will continue rate control and conservative management in the setting of the COVID-19 crisis and patient is agreeable with this plan.  2.  Chronic systolic congestive heart failure/nonischemic cardiomyopathy: EF 40 to 45% by echo in May 2017.  Weight has been stable and so long as she is using low-dose Lasix, volume has been stable as well.  Continue carvedilol and losartan therapy.  3.  Essential hypertension: Stable.  4.  Hyperlipidemia: She remains on lovastatin therapy with an LDL of 67 last year.  5.  Disposition: Follow-up virtual visit in 1 month.  COVID-19 Education: The signs and symptoms of COVID-19 were discussed with the patient and how to seek care for testing (follow up with PCP or arrange E-visit).  The importance of social distancing was discussed today.  Patient Risk:   After full review of this patient's history and clinical status, I feel that he is at least moderate risk for cardiac complications at this time, thus necessitating a telehealth visit sooner than our first available in office visit.  Time:   Today, I have spent 30 minutes with the patient with telehealth technology discussing atrial flutter and heart failure management.     Murray Hodgkins, NP 09/19/2018, 1:11 PM

## 2018-09-19 NOTE — Patient Instructions (Signed)
It was a pleasure to speak with you on the phone today! Thank you for allowing [name] to continue taking care of your [facility] needs during this time.   Feel free to call as needed for questions and concerns related to your cardiac needs.   Medication Instructions:  Your physician recommends that you continue on your current medications as directed. Please refer to the Current Medication list given to you today.  If you need a refill on your cardiac medications before your next appointment, please call your pharmacy.   Lab work: None ordered If you have labs (blood work) drawn today and your tests are completely normal, you will receive your results only by: Marland Kitchen MyChart Message (if you have MyChart) OR . A paper copy in the mail If you have any lab test that is abnormal or we need to change your treatment, we will call you to review the results.  Testing/Procedures: None ordered   Follow-Up: At [facility], you and your health needs are our priority.  As part of our continuing mission to provide you with exceptional heart care, we have created designated Provider Care Teams.  These Care Teams include your primary Cardiologist (physician) and Advanced Practice Providers (APPs -  Physician Assistants and Nurse Practitioners) who all work together to provide you with the care you need, when you need it. You will need a follow up appointment in 1 months. (Husband needs in office, per CB she can do as well if scheduling allows, otherwise can be telephone visit). You may see Kathlyn Sacramento, MD or Murray Hodgkins, NP.

## 2018-10-22 ENCOUNTER — Telehealth: Payer: Self-pay

## 2018-10-22 ENCOUNTER — Ambulatory Visit (INDEPENDENT_AMBULATORY_CARE_PROVIDER_SITE_OTHER): Payer: Medicare Other | Admitting: General Practice

## 2018-10-22 ENCOUNTER — Encounter: Payer: Self-pay | Admitting: General Practice

## 2018-10-22 ENCOUNTER — Other Ambulatory Visit: Payer: Self-pay

## 2018-10-22 VITALS — BP 132/62 | HR 71 | Resp 97 | Ht 66.0 in | Wt 160.8 lb

## 2018-10-22 DIAGNOSIS — I10 Essential (primary) hypertension: Secondary | ICD-10-CM | POA: Diagnosis not present

## 2018-10-22 DIAGNOSIS — I4892 Unspecified atrial flutter: Secondary | ICD-10-CM

## 2018-10-22 DIAGNOSIS — I5022 Chronic systolic (congestive) heart failure: Secondary | ICD-10-CM

## 2018-10-22 NOTE — Telephone Encounter (Signed)
COVID-19 Pre-Screening Questions:   ?   In the past 7 to 10 days have you had a cough, shortness of breath, headache, congestion, fever (100 or greater) body aches, chills, sore throat, or sudden loss of taste or sense of smell?  No  Have you been around anyone with known Covid 19.No   Have you been around anyone who is awaiting Covid 19 test results in the past 7 to 10 days? No  Have you been around anyone who has been exposed to Covid 19, or has mentioned symptoms of Covid 19 within the past 7 to 10 days? No  If you have any concerns/questions about symptoms patients report during screening (either on the phone or at threshold). Contact the provider seeing the patient or DOD for further guidance. If neither are available contact a member of the leadership team."     Patient has been tested by the Health Dept and tested Neg.

## 2018-10-22 NOTE — Progress Notes (Signed)
Cardiology Clinic Note   Patient Name: Jodi Bullock Date of Encounter: 10/22/2018  Primary Care Provider:  Patient, No Pcp Per Primary Cardiologist:  Kathlyn Sacramento, MD  Chief Complaint    Follow-up for atrial flutter  Past Medical History    Past Medical History:  Diagnosis Date   Arthritis    Atrial flutter ([place])    a. Dx 07/2018. CHA2DS2VASc = 5-->Eliquis.   Bladder incontinence    Breast cancer ([place]) 6644   Chronic systolic CHF (congestive heart failure) ([place]) 01/10/2016   a. 09/2015 Echo: EF 40-45%, possible inf HK, mod PAH (PASP 85mmHg).   Closed fracture of right femur ([place])    a. 08/2017->Managed conservatively.   Depression    GERD (gastroesophageal reflux disease)    History of kidney stones    HLD (hyperlipidemia)    Hypertension    Neuropathy of both feet    NICM (nonischemic cardiomyopathy) ([place])    a. 08/2015 Lexiscan MV: EF 39%, no ischemia; b. 09/2015 Echo: EF 40-45%.   Right Humerus head fracture    a. 07/2016 - managed conservatively.   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[place])    cancer   Past Surgical History:  Procedure Laterality Date   ABDOMINAL HYSTERECTOMY     BREAST LUMPECTOMY     CATARACT EXTRACTION     CHOLECYSTECTOMY     JOINT REPLACEMENT Right    Hip   KNEE SURGERY Right     Allergies  Allergies  Allergen Reactions   2,4-D Dimethylamine (Amisol) Other (See Comments)    Other Reaction: OTHER REACTION-IRRITATION TO Esophagous   Celebrex [Celecoxib] Other (See Comments)     esophagitis Esophageal spasms     Ciprofloxacin Other (See Comments)    Esophageal irritation   Ibuprofen     Other reaction(s): Other (See Comments) reflux   Metronidazole Other (See Comments)    neuropathy   Naproxen Other (See Comments)    GI UPSET   Other Other (See Comments)    Other Reaction: esophagitis    History of Present Illness    85 year old female resident of [employer] presents with her husband for follow-up of atrial flutter  diagnosed 07/19/2018.  PMH: Nonischemic cardiomyopathy (April 0347), systolic CHF with reduced LVEF 40 to 45% (2017), HTN, hyperlipidemia, thymus cancer, remote breast cancer, and right femur fracture.  Eliquis, beta-blocker, and ARB were initiated 07/2018 after onset of atrial flutter.  A repeat echocardiogram was planned at that time but has been delayed due to COVID-19. Her CHA2DSVASc score is 5.   She endorses feeling well today.  She states that next week she has an evaluation at [facility] in [place] for a single tooth extraction and wants to know if she should stop her blood thinner.  When asked about her right lower extremity increased edema compared to the left, she stated that she had suffered more than one injury to the right lower leg area.  She denies chest pain, fatigue, weakness, shortness of breath, dizziness, orthopnea, palpitations, melena, hemoptysis, hematuria, and PND.  Home Medications    Prior to Admission medications   Medication Sig Start Date End Date Taking? Authorizing Provider  acetaminophen (TYLENOL) 650 MG CR tablet Take 650 mg by mouth every 8 (eight) hours.     [provider]  apixaban (ELIQUIS) 5 MG TABS tablet Take 1 tablet (5 mg total) by mouth 2 (two) times daily. 07/19/18   Theora Gianotti, NP  carvedilol (COREG) 12.5 MG tablet Take 1.5 tablets (18.75 mg total)  by mouth 2 (two) times daily. 02/22/18 09/19/27  Theora Gianotti, NP  Cholecalciferol 2000 UNITS TABS Take 1 tablet by mouth daily.    Roselee Nova, MD  Cyanocobalamin (VITAMIN DEFICIENCY SYSTEM-B12 IJ) Inject as directed every 30 (thirty) days.    [provider]  docusate sodium (COLACE) 100 MG capsule Take 100 mg by mouth daily.    [provider]  fluticasone (FLONASE) 50 MCG/ACT nasal spray Place 1 spray into both nostrils daily. 08/30/17   Wilhelmina Mcardle, MD  Fluticasone Furoate (ARNUITY ELLIPTA) 100 MCG/ACT AEPB Inhale 1 puff into the lungs daily.  08/30/17   Wilhelmina Mcardle, MD  furosemide (LASIX) 20 MG tablet Take 20 mg by mouth daily.    [provider]  guaiFENesin (MUCINEX) 600 MG 12 hr tablet Take by mouth 2 (two) times daily.    [provider]  loperamide (IMODIUM A-D) 2 MG tablet Take 2 mg by mouth as needed for diarrhea or loose stools.    [provider]  loratadine (CLARITIN) 10 MG tablet Take 1 tablet (10 mg total) by mouth at bedtime. 08/30/17 09/19/27  Wilhelmina Mcardle, MD  LORazepam (ATIVAN) 0.5 MG tablet Take 1 tablet (0.5 mg total) by mouth 2 (two) times daily as needed for anxiety. 05/23/17   Roselee Nova, MD  losartan (COZAAR) 100 MG tablet Take 1 tablet (100 mg total) by mouth daily. 05/23/17   Roselee Nova, MD  lovastatin (MEVACOR) 40 MG tablet Take 1 tablet (40 mg total) by mouth daily. Patient taking differently: Take 40 mg by mouth daily. Pt takes 3 times weekly 05/23/17   Roselee Nova, MD  Multiple Vitamins-Minerals (PRESERVISION/LUTEIN) CAPS Take 1 each by mouth 2 (two) times daily.    [provider]  omeprazole (PRILOSEC) 40 MG capsule Take 40 mg by mouth daily. Out of med-needs to get OTC from pharmacy    [provider]  polyethylene glycol powder (GLYCOLAX/MIRALAX) powder 17 grams a day 10/22/09   [provider]  Saccharomyces boulardii (PROBIOTIC) 250 MG CAPS Take by mouth daily.    [provider]  traMADol (ULTRAM) 50 MG tablet Take 50 mg by mouth as needed.     [provider]  trimethoprim (TRIMPEX) 100 MG tablet Take 1 tablet (100 mg total) by mouth daily. 03/04/18   Bjorn Loser, MD      Review of Systems    General:  No chills, fever, night sweats or weight changes.  Cardiovascular:  No chest pain, dyspnea on exertion, lower extremity generalized edema, orthopnea, palpitations, paroxysmal nocturnal dyspnea. Dermatological: No rash, lesions/masses Respiratory: No cough, dyspnea Urologic: No hematuria,  dysuria Abdominal:   No nausea, vomiting, diarrhea, bright red blood per rectum, melena, or hematemesis Neurologic:  No visual changes, wkns, changes in mental status. All other systems reviewed and are otherwise negative except as noted above.  Physical Exam    VS:  BP 132/62 (BP Location: Left Arm, Patient Position: Sitting, Cuff Size: Normal)    Pulse 71    Resp (!) 97    Ht 5\' 6"  (1.676 m)    Wt 160 lb 12.8 oz (72.9 kg)    BMI 25.95 kg/m  , BMI Body mass index is 25.95 kg/m. GEN: Well nourished, well developed, in no acute distress. HEENT: normal. Neck: Supple, no JVD, carotid bruits, or masses. Cardiac: RRR, no murmurs, rubs, or gallops. No clubbing, cyanosis, generalized lower extremity edema.  Radials/DP/PT 2+  and equal bilaterally.  Respiratory:  Respirations regular and unlabored, clear to auscultation bilaterally. GI: Soft, nontender, nondistended, BS + x 4. MS: no deformity or atrophy. Skin: warm and dry, no rash. Neuro:  Strength and sensation are intact. Psych: Normal affect.  Accessory Clinical Findings    ECG personally reviewed by me today- none today  Assessment & Plan   1.  Atrial flutter-stable and rate controlled. Continue carvedilol 12.5 mg twice daily ContinueLosartan 100 mg tablet daily Continue apixaban 5 mg tablet twice daily- continue for single tooth extraction.  Educated patient she expressed understanding. No plan for DCCV, medical management tolerated well at this time. Repeat BMP and CBC  2.  Chronic systolic congestive heart failure/nonischemic cardiomyopathy-No increased edema, stable at present time. Follow low-sodium diet Monitor daily weights (same time each day) Continue furosemide 20 mg tablet daily Elevate lower extremities  3.  Essential hypertension Continue Losartan 100 mg tablet daily Well-controlled.  Follow-up with Dr. Fletcher Anon 3 months.  Deberah Pelton, NP 10/22/2018, 5:48 PM

## 2018-10-22 NOTE — Patient Instructions (Signed)
Medication Instructions:  Your physician recommends that you continue on your current medications as directed. Please refer to the Current Medication list given to you today.  If you need a refill on your cardiac medications before your next appointment, please call your pharmacy.   Lab work: Your physician recommends that you return for lab work on Thursday, October 31, 2018 AT [place] (BMET, CBC). - Please go to the [facility]. You will check in at the front desk to the right as you walk into the atrium. Valet Parking is offered if needed. - You will  Need to wear a mask.  If you have labs (blood work) drawn today and your tests are completely normal, you will receive your results only by: Marland Kitchen MyChart Message (if you have MyChart) OR . A paper copy in the mail If you have any lab test that is abnormal or we need to change your treatment, we will call you to review the results.  Testing/Procedures: - None ordered.   Follow-Up: At [facility], you and your health needs are our priority.  As part of our continuing mission to provide you with exceptional heart care, we have created designated Provider Care Teams.  These Care Teams include your primary Cardiologist (physician) and Advanced Practice Providers (APPs -  Physician Assistants and Nurse Practitioners) who all work together to provide you with the care you need, when you need it. You will need a follow up appointment in 3 months.  Please call our office 2 months in advance to schedule this appointment.  You may see Kathlyn Sacramento, MD or one of the following Advanced Practice Providers on your designated Care Team:   Murray Hodgkins, NP Christell Faith, PA-C . Marrianne Mood, PA-C

## 2018-10-24 ENCOUNTER — Telehealth: Payer: Self-pay | Admitting: Cardiovascular Disease

## 2018-10-24 NOTE — Telephone Encounter (Signed)
° °  [facility] Pre-operative Risk Assessment    Request for surgical clearance:  1. What type of surgery is being performed? Outpatient oral surgery < 1 hr may involve mild to moderate blood loss  2. When is this surgery scheduled?  tbd  3. What type of clearance is required (medical clearance vs. Pharmacy clearance to hold med vs. Both)? both  4. Are there any medications that need to be held prior to surgery and how long? Please advise    5. Practice name and name of physician performing surgery? [facility]    6. What is your office phone number [phone]   7.   What is your office fax number  [phone]  8.   Anesthesia type (None, local, MAC, general) ? General or deep sedation may be used    Clarisse Gouge 10/24/2018, 8:43 AM  _________________________________________________________________   (provider comments below)

## 2018-10-25 NOTE — Telephone Encounter (Signed)
Please call dental center and get further details on what procedure is going to be done. Per 6/9 OV with our [facility] NP Coletta Memos, the patient told him this was going to be a single tooth extraction and therefore no need to hold blood thinners. I will finalize and fax rec when this info is known.Jodi Bullock

## 2018-10-25 NOTE — Telephone Encounter (Signed)
   Primary Cardiologist: Kathlyn Sacramento, MD  Chart reviewed as part of pre-operative protocol coverage. Simple dental extractions are considered low risk procedures per guidelines and generally do not require any specific cardiac clearance. It is also generally accepted that for simple extractions and dental cleanings, there is no need to interrupt blood thinner therapy.  SBE prophylaxis is not required for the patient.  I will route this recommendation to the requesting party via Epic fax function and remove from pre-op pool.  Please call with questions.  Charlie Pitter, PA-C 10/25/2018, 9:55 AM

## 2018-10-25 NOTE — Telephone Encounter (Signed)
Spoke with Kirtland Bouchard at [facility], and he confirms pt is having 1 tooth extraction.

## 2018-10-30 ENCOUNTER — Ambulatory Visit: Payer: Medicare Other | Admitting: Urology

## 2018-10-31 ENCOUNTER — Other Ambulatory Visit
Admission: RE | Admit: 2018-10-31 | Discharge: 2018-10-31 | Disposition: A | Discharge status: Home / Self Care | Payer: Medicare Other | Source: Ambulatory Visit | Attending: General Practice | Admitting: General Practice

## 2018-10-31 DIAGNOSIS — I10 Essential (primary) hypertension: Secondary | ICD-10-CM | POA: Diagnosis present

## 2018-10-31 DIAGNOSIS — I5022 Chronic systolic (congestive) heart failure: Secondary | ICD-10-CM | POA: Diagnosis present

## 2018-10-31 LAB — CBC WITH DIFFERENTIAL/PLATELET
Abs Immature Granulocytes: 0.04 10*3/uL (ref 0.00–0.07)
Basophils Absolute: 0.1 10*3/uL (ref 0.0–0.1)
Basophils Relative: 1 %
Eosinophils Absolute: 0.4 10*3/uL (ref 0.0–0.5)
Eosinophils Relative: 4 %
HCT: 42.8 % (ref 36.0–46.0)
Hemoglobin: 13.7 g/dL (ref 12.0–15.0)
Immature Granulocytes: 1 %
Lymphocytes Relative: 15 %
Lymphs Abs: 1.3 10*3/uL (ref 0.7–4.0)
MCH: 30 pg (ref 26.0–34.0)
MCHC: 32 g/dL (ref 30.0–36.0)
MCV: 93.9 fL (ref 80.0–100.0)
Monocytes Absolute: 0.9 10*3/uL (ref 0.1–1.0)
Monocytes Relative: 10 %
Neutro Abs: 5.9 10*3/uL (ref 1.7–7.7)
Neutrophils Relative %: 69 %
Platelets: 208 10*3/uL (ref 150–400)
RBC: 4.56 MIL/uL (ref 3.87–5.11)
RDW: 12.8 % (ref 11.5–15.5)
WBC: 8.6 10*3/uL (ref 4.0–10.5)
nRBC: 0 % (ref 0.0–0.2)

## 2018-10-31 LAB — BASIC METABOLIC PANEL
Anion gap: 9 (ref 5–15)
BUN: 19 mg/dL (ref 8–23)
CO2: 27 mmol/L (ref 22–32)
Calcium: 8.9 mg/dL (ref 8.9–10.3)
Chloride: 105 mmol/L (ref 98–111)
Creatinine, Ser: 0.87 mg/dL (ref 0.44–1.00)
GFR calc Af Amer: >60 mL/min (ref >60)
GFR calc non Af Amer: 58 mL/min — ABNORMAL LOW (ref >60)
Glucose, Bld: 138 mg/dL — ABNORMAL HIGH (ref 70–99)
Potassium: 4.1 mmol/L (ref 3.5–5.1)
Sodium: 141 mmol/L (ref 135–145)

## 2018-10-31 NOTE — Telephone Encounter (Signed)
Please call regarding clearance for dental procedure. Needs update.

## 2018-10-31 NOTE — Telephone Encounter (Signed)
Routed to number provided via EPIC fax.  

## 2018-10-31 NOTE — Telephone Encounter (Signed)
Fax number to [facility] [phone]

## 2018-11-04 NOTE — Telephone Encounter (Signed)
Patient returning call.

## 2018-11-04 NOTE — Telephone Encounter (Signed)
I spoke with the patient. I advised her I wasn't sure what she was being called about unless it was due to her clearance.  She is aware she has been cleared for her procedure and I also discussed her BMP/ CBC prelim results with her.   She was wondering if it was to schedule her echo.  I advised I will forward a message to scheduling to see if they tried to reach her.   The patient voices understanding and is agreeable.

## 2018-11-05 NOTE — Telephone Encounter (Signed)
Called patient to schedule 7/17 at 8 am

## 2018-11-05 NOTE — Telephone Encounter (Signed)
Yes  She is in the scheduling wq.  So sorry, I didn't realize .

## 2018-11-06 ENCOUNTER — Telehealth: Payer: Self-pay | Admitting: Cardiovascular Disease

## 2018-11-06 NOTE — Telephone Encounter (Signed)
Patient had teeth pulled recently Patient was told in order to stop bleeding to go off blood thinner Patient has been off medication since yesterday and would like to know when to start back  Please call to discuss

## 2018-11-06 NOTE — Telephone Encounter (Signed)
Returned pt call. Lmtcb.  Per pre-op  Primary Cardiologist: Kathlyn Sacramento, MD  Chart reviewed as part of pre-operative protocol coverage. Simple dental extractions are considered low risk procedures per guidelines and generally do not require any specific cardiac clearance. It is also generally accepted that for simple extractions and dental cleanings, there is no need to interrupt blood thinner therapy.  SBE prophylaxis is not required for the patient.  I will route this recommendation to the requesting party via Epic fax function and remove from pre-op pool.  Please call with questions.  Charlie Pitter, PA-C 10/25/2018, 9:55 AM

## 2018-11-07 NOTE — Telephone Encounter (Signed)
Spoke with the pt. Pt sts that she had 1 tooth extracted 2 days ago on  11/05/18 and did follow the instructions not to hold Eliquis. Pt is a resident at [facility]. Pt sts that when she returned home the area began to bleed. The facility held her evening dose of Eliquis and the following morning dose. Pt resumed Eliquis on the evening of 11/06/18. Pt reports no signs of bleeding. Adv the pt that there was no interruption in Eliquis needed. It would be expected that there would be some bleeding due to the blood thinner. Pt verbalized understanding. No further action required.

## 2018-11-07 NOTE — Telephone Encounter (Signed)
No answer. Left message to call back.   

## 2018-11-12 ENCOUNTER — Encounter: Payer: Self-pay | Admitting: Emergency Medicine

## 2018-11-12 ENCOUNTER — Other Ambulatory Visit: Payer: Self-pay

## 2018-11-12 ENCOUNTER — Emergency Department
Admission: EM | Admit: 2018-11-12 | Discharge: 2018-11-12 | Disposition: A | Discharge status: Home / Self Care | Payer: Medicare Other | Attending: Emergency Medicine | Admitting: Emergency Medicine

## 2018-11-12 DIAGNOSIS — I4892 Unspecified atrial flutter: Secondary | ICD-10-CM | POA: Diagnosis not present

## 2018-11-12 DIAGNOSIS — E041 Nontoxic single thyroid nodule: Secondary | ICD-10-CM | POA: Insufficient documentation

## 2018-11-12 DIAGNOSIS — Z79899 Other long term (current) drug therapy: Secondary | ICD-10-CM | POA: Insufficient documentation

## 2018-11-12 DIAGNOSIS — Z87891 Personal history of nicotine dependence: Secondary | ICD-10-CM | POA: Insufficient documentation

## 2018-11-12 DIAGNOSIS — I110 Hypertensive heart disease with heart failure: Secondary | ICD-10-CM | POA: Diagnosis not present

## 2018-11-12 DIAGNOSIS — K91840 Postprocedural hemorrhage and hematoma of a digestive system organ or structure following a digestive system procedure: Secondary | ICD-10-CM | POA: Diagnosis not present

## 2018-11-12 DIAGNOSIS — I10 Essential (primary) hypertension: Secondary | ICD-10-CM

## 2018-11-12 DIAGNOSIS — I5022 Chronic systolic (congestive) heart failure: Secondary | ICD-10-CM | POA: Diagnosis not present

## 2018-11-12 DIAGNOSIS — K1379 Other lesions of oral mucosa: Secondary | ICD-10-CM

## 2018-11-12 MED ORDER — LABETALOL HCL 5 MG/ML IV SOLN
10.0000 mg | Freq: Once | INTRAVENOUS | Status: AC
  Administered 2018-11-12: 10 mg via INTRAVENOUS
  Filled 2018-11-12: qty 4

## 2018-11-12 MED ORDER — LABETALOL HCL 5 MG/ML IV SOLN
5.0000 mg | Freq: Once | INTRAVENOUS | Status: AC
  Administered 2018-11-12: 5 mg via INTRAVENOUS

## 2018-11-12 MED ORDER — LOSARTAN POTASSIUM 50 MG PO TABS
100.0000 mg | ORAL_TABLET | ORAL | Status: AC
  Administered 2018-11-12: 100 mg via ORAL
  Filled 2018-11-12: qty 2

## 2018-11-12 MED ORDER — CARVEDILOL 6.25 MG PO TABS
18.7500 mg | ORAL_TABLET | ORAL | Status: AC
  Administered 2018-11-12: 18.75 mg via ORAL
  Filled 2018-11-12: qty 3

## 2018-11-12 MED ORDER — TRANEXAMIC ACID 1000 MG/10ML IV SOLN
500.0000 mg | Freq: Once | INTRAVENOUS | Status: DC
  Filled 2018-11-12: qty 10

## 2018-11-12 MED ORDER — LABETALOL HCL 5 MG/ML IV SOLN: 10.0000 mg | Freq: Once | INTRAVENOUS | Status: DC

## 2018-11-12 NOTE — ED Notes (Signed)
Pt assisted to restroom.  

## 2018-11-12 NOTE — ED Triage Notes (Signed)
EMS pt from [facility] , dental bleeding tonight , tooth pulled approx x1 week ago, pt A& O X4 ambulatory with assistance

## 2018-11-12 NOTE — ED Provider Notes (Signed)
[facility] Emergency Department Provider Note  ____________________________________________  Time seen: Approximately 5:06 AM  I have reviewed the triage vital signs and the nursing notes.   HISTORY  Chief Complaint Dental Problem    HPI Jodi Bullock is a 85 y.o. female with a history of atrial flutter on Eliquis, CHF, hypertension, left upper jaw dental extraction 1 week ago who is sent to the ED tonight due to bleeding from the extraction site.  Patient denies any trauma.  She denies any other complaints other than the bleeding from her extraction site.  No fevers or chills, no headaches or vision changes.      Past Medical History:  Diagnosis Date  . Arthritis   . Atrial flutter ([place])    a. Dx 07/2018. CHA2DS2VASc = 5-->Eliquis.  . Bladder incontinence   . Breast cancer ([place]) 1998  . Chronic systolic CHF (congestive heart failure) ([place]) 01/10/2016   a. 09/2015 Echo: EF 40-45%, possible inf HK, mod PAH (PASP 53mmHg).  . Closed fracture of right femur ([place])    a. 08/2017->Managed conservatively.  . Depression   . GERD (gastroesophageal reflux disease)   . History of kidney stones   . HLD (hyperlipidemia)   . Hypertension   . Neuropathy of both feet   . NICM (nonischemic cardiomyopathy) ([place])    a. 08/2015 Lexiscan MV: EF 39%, no ischemia; b. 09/2015 Echo: EF 40-45%.  . Right Humerus head fracture    a. 07/2016 - managed conservatively.  . 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 Femur fracture ([place]) 09/09/2017  . Abnormal gait 12/19/2016  . Acquired spondylolisthesis 12/19/2016  . Breast lump 12/19/2016  . Cervical spondylosis without myelopathy 12/19/2016  . Closed Colles' fracture 12/19/2016  . Closed fracture of base of neck of femur ([place]) 12/19/2016  . Closed fracture of lower end of radius and ulna 12/19/2016  . Closed fracture of neck of femur ([place]) 12/19/2016  . Spondylolisthesis,  congenital 12/19/2016  . Contracture of joint of hand 12/19/2016  . Contracture of wrist joint 12/19/2016  . Contusion of hand 12/19/2016  . Contusion of hip 12/19/2016  . Contusion of knee 12/19/2016  . Degeneration of intervertebral disc at C4-C5 level 12/19/2016  . Degeneration of lumbar intervertebral disc 12/19/2016  . Disorder of coccyx 12/19/2016  . Dry eyes 12/19/2016  . Enthesopathy 12/19/2016  . Enthesopathy of hip region 12/19/2016  . Hand joint pain 12/19/2016  . Hand joint stiff 12/19/2016  . Hip pain 12/19/2016  . Joint pain 12/19/2016  . Knee pain 12/19/2016  . Localized, primary osteoarthritis 12/19/2016  . Loose body in hip joint 12/19/2016  . Lumbar sprain 12/19/2016  . Abnormal mammogram 12/19/2016  . Meibomian gland dysfunction 12/19/2016  . Muscle weakness 12/19/2016  . Open fracture of lower end of forearm 12/19/2016  . Presbyopia 12/19/2016  . Regular astigmatism 12/19/2016  . Shoulder joint pain 12/19/2016  . Skin sensation disturbance 12/19/2016  . Sprain of ankle 12/19/2016  . Trochanteric bursitis 12/19/2016  . Wrist joint pain 12/19/2016  . Stiffness of wrist joint 12/19/2016  . Acute bilateral low back pain without sciatica 09/29/2016  . Acute bronchitis 06/19/2016  . Recurrent productive cough 06/14/2016  . Medicare annual wellness visit, subsequent 05/17/2016  . Fall in elderly patient 01/27/2016  . Sprain of wrist, right 01/26/2016  . Systolic CHF with reduced left ventricular function, NYHA class 3 ([place]) 01/10/2016  .  Productive cough 11/19/2015  . Chronic systolic heart failure ([place]) 10/12/2015  . Dyspnea on exertion 08/31/2015  . Fatigue 08/31/2015  . 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 Irregular heart rhythm 07/22/2015  . Left arm pain 06/29/2015  . Dyslipidemia 06/03/2015  . Acute recurrent maxillary sinusitis 06/03/2015  . Oral mucosal lesion 05/26/2015  . Urge incontinence 02/14/2015  . Ankle edema 02/02/2015  . At risk for  falling 01/25/2015  . Dizziness 01/25/2015  . Acid reflux 01/25/2015  . Big thyroid 01/25/2015  . Gravida 2 para 2 01/25/2015  . Personal history of malignant neoplasm of breast 01/25/2015  . Arthritis, degenerative 01/25/2015  . Parity 2 01/25/2015  . Peripheral blood vessel disorder ([place]) 01/25/2015  . Need for vaccination 01/25/2015  . Pain in rectum 01/25/2015  . Reflux 01/25/2015  . Screening for depression 01/25/2015  . Disease of accessory sinus 01/25/2015  . Headache, temporal 01/25/2015  . Primary cancer of thymus ([place]) 01/25/2015  . Disease of thyroid gland 01/25/2015  . Avitaminosis D 01/25/2015  . Family history of diabetes mellitus in father 12/31/2014  . Fasting hyperglycemia 12/31/2014  . Hypertension 12/01/2014  . HLD (hyperlipidemia) 12/01/2014  . Anxiety 12/01/2014  . Myofascial pain 12/01/2014  . Breast CA ([place]) 11/24/2014  . Absence of bladder continence 11/24/2014  . Urgency of micturation 11/24/2014  . Common bile duct dilatation 10/20/2014  . Pancreatic cyst 10/20/2014  . Kidney stones 10/20/2014  . Neuritis or radiculitis due to rupture of lumbar intervertebral disc 06/15/2014  . Lumbar canal stenosis 06/15/2014  . Degenerative arthritis of lumbar spine 06/15/2014  . Lumbar radiculitis 06/15/2014  . Osteoarthritis of spine with radiculopathy, lumbar region 06/15/2014  . 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 Bloodgood disease 11/05/2012  . Arthritis of temporomandibular joint 06/24/2012  . Atypical chest pain 01/05/2012  . Breath shortness 12/04/2011  . Lung mass 11/15/2011  . Thyroid nodule 11/15/2011  . Age-related macular degeneration, dry 10/03/2011  . Disorder of peripheral nervous system 10/03/2011  . Chronic rhinitis 03/03/2011  . Carotid artery narrowing 03/01/2011  . Polypharmacy 12/02/2010     Past Surgical History:  Procedure Laterality Date  . ABDOMINAL HYSTERECTOMY    . BREAST LUMPECTOMY    . CATARACT EXTRACTION    .  CHOLECYSTECTOMY    . JOINT REPLACEMENT Right    Hip  . KNEE SURGERY Right      Prior to Admission medications   Medication Sig Start Date End Date Taking? Authorizing Provider  acetaminophen (TYLENOL) 650 MG CR tablet Take 650 mg by mouth every 8 (eight) hours.     [provider]  apixaban (ELIQUIS) 5 MG TABS tablet Take 1 tablet (5 mg total) by mouth 2 (two) times daily. 07/19/18   Theora Gianotti, NP  carvedilol (COREG) 12.5 MG tablet Take 1.5 tablets (18.75 mg total) by mouth 2 (two) times daily. 02/22/18 09/19/27  Theora Gianotti, NP  Cholecalciferol 2000 UNITS TABS Take 1 tablet by mouth daily.    Roselee Nova, MD  Cyanocobalamin (VITAMIN DEFICIENCY SYSTEM-B12 IJ) Inject as directed every 30 (thirty) days.    [provider]  docusate sodium (COLACE) 100 MG capsule Take 100 mg by mouth daily.    [provider]  fluticasone (FLONASE) 50 MCG/ACT nasal spray Place 1 spray into both nostrils daily. 08/30/17   Wilhelmina Mcardle, MD  Fluticasone Furoate (ARNUITY ELLIPTA) 100 MCG/ACT AEPB Inhale 1 puff into the lungs daily. 08/30/17   Wilhelmina Mcardle,  MD  furosemide (LASIX) 20 MG tablet Take 20 mg by mouth daily.    [provider]  guaiFENesin (MUCINEX) 600 MG 12 hr tablet Take by mouth 2 (two) times daily.    [provider]  loperamide (IMODIUM A-D) 2 MG tablet Take 2 mg by mouth as needed for diarrhea or loose stools.    [provider]  loratadine (CLARITIN) 10 MG tablet Take 1 tablet (10 mg total) by mouth at bedtime. 08/30/17 09/19/27  Wilhelmina Mcardle, MD  LORazepam (ATIVAN) 0.5 MG tablet Take 1 tablet (0.5 mg total) by mouth 2 (two) times daily as needed for anxiety. 05/23/17   Roselee Nova, MD  losartan (COZAAR) 100 MG tablet Take 1 tablet (100 mg total) by mouth daily. 05/23/17   Roselee Nova, MD  lovastatin (MEVACOR) 40 MG tablet Take 1 tablet (40 mg total) by mouth daily. Patient taking differently: Take  40 mg by mouth daily. Pt takes 3 times weekly 05/23/17   Roselee Nova, MD  Multiple Vitamins-Minerals (PRESERVISION/LUTEIN) CAPS Take 1 each by mouth 2 (two) times daily.    [provider]  omeprazole (PRILOSEC) 40 MG capsule Take 40 mg by mouth daily. Out of med-needs to get OTC from pharmacy    [provider]  polyethylene glycol powder (GLYCOLAX/MIRALAX) powder 17 grams a day 10/22/09   [provider]  Saccharomyces boulardii (PROBIOTIC) 250 MG CAPS Take by mouth daily.    [provider]  traMADol (ULTRAM) 50 MG tablet Take 50 mg by mouth as needed.     [provider]  trimethoprim (TRIMPEX) 100 MG tablet Take 1 tablet (100 mg total) by mouth daily. 03/04/18   Bjorn Loser, MD     Allergies 2,4-d dimethylamine (amisol); Celebrex [celecoxib]; Ciprofloxacin; Ibuprofen; Metronidazole; Naproxen; and Other   Family History  Problem Relation Age of Onset  . Stroke Mother   . Diabetes Father   . Heart disease Father   . Kidney disease Neg Hx   . Bladder Cancer Neg Hx   . Kidney cancer Neg Hx     Social History Social History   Tobacco Use  . Smoking status: Former Research scientist (life sciences)  . Smokeless tobacco: Never Used  . Tobacco comment: smoked in high school for a few years only  Substance Use Topics  . Alcohol use: No    Alcohol/week: 0.0 standard drinks  . Drug use: No    Review of Systems  Constitutional:   No fever or chills.  ENT:   No sore throat. No rhinorrhea. Cardiovascular:   No chest pain or syncope. Respiratory:   No dyspnea or cough. Gastrointestinal:   Negative for abdominal pain, vomiting and diarrhea.  Musculoskeletal:   Negative for focal pain or swelling All other systems reviewed and are negative except as documented above in ROS and HPI.  ____________________________________________   PHYSICAL EXAM:  VITAL SIGNS: ED Triage Vitals  Enc Vitals Group     BP 11/12/18 0100 (!) 191/84     Pulse Rate 11/12/18  0056 77     Resp 11/12/18 0056 18     Temp 11/12/18 0056 98.3 F (36.8 C)     Temp Source 11/12/18 0056 Oral     SpO2 11/12/18 0056 97 %     Weight 11/12/18 0056 165 lb (74.8 kg)     Height 11/12/18 0056 5\' 6"  (1.676 m)     Head Circumference --      Peak Flow --  Pain Score 11/12/18 0056 0     Pain Loc --      Pain Edu? --      Excl. in [place]? --     Vital signs reviewed, nursing assessments reviewed.   Constitutional:   Alert and oriented. Non-toxic appearance. Eyes:   Conjunctivae are normal. EOMI.  ENT      Head:   Normocephalic and atraumatic.      Nose:   No congestion/rhinnorhea.  No epistaxis      Mouth/Throat:   MMM, no pharyngeal erythema. No peritonsillar mass.  Left upper jaw extraction site with good granulation tissue, not a dry socket, with blood oozing.  No pulsatile hemorrhage      Neck:   No meningismus. Full ROM. Hematological/Lymphatic/Immunilogical:   No cervical lymphadenopathy. Cardiovascular:   RRR. Symmetric bilateral radial and DP pulses.  No murmurs. Cap refill less than 2 seconds. Respiratory:   Normal respiratory effort without tachypnea/retractions. Breath sounds are clear and equal bilaterally. No wheezes/rales/rhonchi. Gastrointestinal:   Soft and nontender. Non distended. There is no CVA tenderness.  No rebound, rigidity, or guarding. Musculoskeletal:   Normal range of motion in all extremities. No joint effusions.  No lower extremity tenderness.  No edema. Neurologic:   Normal speech and language.  Motor grossly intact. No acute focal neurologic deficits are appreciated.   ____________________________________________    LABS (pertinent positives/negatives) (all labs ordered are listed, but only abnormal results are displayed) Labs Reviewed - No data to display ____________________________________________   EKG    ____________________________________________    RADIOLOGY  No results  found.  ____________________________________________    PROCEDURES .Oral Bleeding Management  Date/Time: 11/12/2018 5:11 AM Performed by: Carrie Mew, MD Authorized by: Carrie Mew, MD   Consent:    Consent obtained:  Verbal   Consent given by:  Patient   Risks discussed:  Bleeding, infection and pain   Alternatives discussed:  Referral, observation and no treatment Anesthesia (see MAR for exact dosages):    Anesthesia method:  None Procedure details:    Treatment complexity:  Extensive   Treatment episode: initial  Method:  Topical tranexamic acid application, surgical application  Post-procedure details:    Assessment:  Bleeding stopped   Patient tolerance of procedure:  Tolerated well, no immediate complications     ____________________________________________    CLINICAL IMPRESSION / ASSESSMENT AND PLAN / ED COURSE  Medications ordered in the ED: Medications  tranexamic acid (CYKLOKAPRON) injection 500 mg (has no administration in time range)  labetalol (NORMODYNE) injection 10 mg (10 mg Intravenous Given 11/12/18 0154)  carvedilol (COREG) tablet 18.75 mg (18.75 mg Oral Given 11/12/18 0436)  losartan (COZAAR) tablet 100 mg (100 mg Oral Given 11/12/18 0435)  labetalol (NORMODYNE) injection 5 mg (5 mg Intravenous Given 11/12/18 0440)    Pertinent labs & imaging results that were available during my care of the patient were reviewed by me and considered in my medical decision making (see chart for details).  Jodi Bullock was evaluated in Emergency Department on 11/12/2018 for the symptoms described in the history of present illness. She was evaluated in the context of the global COVID-19 pandemic, which necessitated consideration that the patient might be at risk for infection with the SARS-CoV-2 virus that causes COVID-19. Institutional protocols and algorithms that pertain to the evaluation of patients at risk for COVID-19 are in a state of rapid change  based on information released by regulatory bodies including the CDC and federal and state organizations. These policies and  algorithms were followed during the patient's care in the ED.   Patient presents with bleeding from a dental extraction site in the setting of Eliquis use.  She is hypertensive at 180/100 which is likely contributing to the bleeding.  Will control blood pressure with 10 mg IV labetalol, work on achieving hemostasis.  Clinical Course as of Nov 11 508  Tue Nov 12, 2018  0313 Blood pressure now 140/80, heart rate of 70 after 10 mg of IV labetalol.  Patient reassessed, after removing the gauze there is no active bleeding.  I placed tranexamic acid soaked gauze in the socket to ensure that the area is stabilized, will reassess momentarily.   [PS]  0405 TXA gauze removed. Still a very slight amount of oozing. Surgicel applied.    [PS]    Clinical Course User Index [PS] Carrie Mew, MD    ----------------------------------------- 5:09 AM on 11/12/2018 -----------------------------------------  Surgicel removed, no residual bleeding.  5 mg of IV labetalol given additionally as well as her morning antihypertensives to continue controlling her high blood pressure to prevent rebleeding.  Stable for discharge.  Recommend follow-up with primary care in 1 to 2 days for blood pressure recheck and management.   ____________________________________________   FINAL CLINICAL IMPRESSION(S) / ED DIAGNOSES    Final diagnoses:  Oral bleeding  Essential hypertension     ED Discharge Orders    None      Portions of this note were generated with dragon dictation software. Dictation errors may occur despite best attempts at proofreading.   Carrie Mew, MD 11/12/18 [phone]

## 2018-11-12 NOTE — ED Notes (Signed)
PT assisted to restroom

## 2018-11-12 NOTE — Discharge Instructions (Addendum)
Do not take your Eliquis today (June 30) to allow your extraction site to continue healing.  You may resume Eliquis tomorrow (July 1).  Avoid chewing on the left side of your mouth, and try to stick to a soft diet for the next week until healing is complete.  Your blood pressure was elevated in the ED today, approximately 170/100.  This may have contributed to the bleeding.  We gave you your morning dose of carvedilol and losartan here in the emergency department at 5:00 AM.  Do not take your morning dose of these medicines back at home.  Follow-up with your doctor in 1 to 2 days for blood pressure recheck and medication adjustments as needed.

## 2018-11-12 NOTE — ED Notes (Signed)
Pt assisted to bathroom

## 2018-11-13 ENCOUNTER — Telehealth: Payer: Self-pay | Admitting: Cardiovascular Disease

## 2018-11-13 NOTE — Telephone Encounter (Signed)
Call to patient.   She had 1 tooth extraction on 6/23. She did not hold anticoagulant as advised.   Had some oozing at site over the weekend.   Yesterday had more bleeding and went to ED. Was hypertensive. Held anticoagulant yesterday.   Treated with local TXA to site.   BP improved today 139/73 HR 69, no bleeding today. Eliquis resumed today as advised by ED AVS.   Reviewed care to keep scab in place.   Pt wanted to ensure that Dr. Fletcher Anon was aware.  Advised pt to call for any further questions or concerns

## 2018-11-13 NOTE — Telephone Encounter (Signed)
Pt c/o medication issue:  1. Name of Medication: Eliquis   2. How are you currently taking this medication (dosage and times per day)? 5 mg po BID   3. Are you having a reaction (difficulty breathing--STAT)? No   4. What is your medication issue? Bleeding post oral extraction went to ED to control and it has stopped   Patient states she was given some medication to take at ED and wants advise on taking it and restarting Eliquis   Please call

## 2018-11-14 ENCOUNTER — Other Ambulatory Visit: Payer: Self-pay

## 2018-11-14 ENCOUNTER — Encounter: Payer: Self-pay | Admitting: Urology

## 2018-11-14 ENCOUNTER — Ambulatory Visit (INDEPENDENT_AMBULATORY_CARE_PROVIDER_SITE_OTHER): Payer: Medicare Other | Admitting: Urology

## 2018-11-14 VITALS — BP 170/76 | HR 75 | Ht 66.0 in | Wt 155.0 lb

## 2018-11-14 DIAGNOSIS — N3281 Overactive bladder: Secondary | ICD-10-CM | POA: Diagnosis not present

## 2018-11-14 NOTE — Progress Notes (Signed)
° °  11/14/2018 9:50 AM   Jodi Bullock Sep 04, 1924 [id number]  Reason for visit: Follow up OAB  HPI: I saw Jodi Bullock in urology clinic today for follow-up of OAB.  She was previously followed by Jodi Bullock and Dr. Arther Bullock, but wanted to be seen by a new provider today.  Her primary complaint is urinary frequency during the day, as well as nocturia 1-3 times per night.  She has a number of comorbidities and has been trialed on numerous medications including Myrbetriq, anticholinergics, and undergone PTNS.  She is on prophylactic trimethoprim for UTIs which has worked well.  She does take Lasix, which she takes at 4 AM to prevent worse nocturia.  Denies any significant urgency or urge incontinence.  She is very bothered by her urinary symptoms and would like to explore any other options.  She denies any gross hematuria or recent UTIs.   ROS: Please see flowsheet from today's date for complete review of systems.  Physical Exam: BP (!) 170/76    Pulse 75    Ht 5\' 6"  (1.676 m)    Wt 155 lb (70.3 kg)    BMI 25.02 kg/m    Constitutional: Frail-appearing, in wheelchair Respiratory: Normal respiratory effort, no increased work of breathing. GI: Abdomen is soft, nontender, nondistended, no abdominal masses Skin: No rashes, bruises or suspicious lesions. Neurologic: Grossly intact, no focal deficits, moving all 4 extremities. Psychiatric: Normal mood and affect  Assessment & Plan:   In summary, the patient is a 85 year old comorbid female with congestive heart failure on Lasix who presents with urinary frequency.  She only has nocturia 1-3 times per night, which is obviously worse when she takes her Lasix.  I had a very frank conversation with the patient that we need to be realistic about her treatment goals with her age and comorbidity.  She would like to trial Myrbetriq again for her symptoms of frequency.  I reviewed that she is not a candidate for anticholinergics with her age and high fall  risk.  We also discussed behavioral strategies including minimizing fluids in the evening and double voiding prior to bed.  We discussed that overactive bladder (OAB) is not a disease, but is a symptom complex that is generally not life-threatening.  Symptoms typically include urinary urgency, frequency, and urge incontinence.  There are numerous treatment options, however there are risks and benefits with both medical and surgical management.  First-line treatment is behavioral therapies including bladder training, pelvic floor muscle training, and fluid management.  Second line treatments include oral antimuscarinics(Ditropan er, Trospium) and beta-3 agonist (Mybetriq). There is typically a period of medication trial (4-8 weeks) to find the optimal therapy and dosing. If symptoms are bothersome despite the above management, third line options include intra-detrusor botox, peripheral tibial nerve stimulation (PTNS), and interstim (SNS). These are more invasive treatments with higher side effect profile, but may improve quality of life for patients with severe OAB symptoms.   Re-trial Mybetriq 25mg  daily RTC 4 weeks for BP check, PVR, symptom check  A total of 15 minutes were spent face-to-face with the patient, greater than 50% was spent in patient education, counseling, and coordination of care regarding overactive bladder and realistic treatment goals.   B[place]ley Co, [facility] [address], [place], [place] [address] [phone]

## 2018-11-14 NOTE — Patient Instructions (Signed)
Samples of Myrbetriq 25mg  given 1 month Take 1 tablet by mouth daily Overactive Bladder, Adult  Overactive bladder refers to a condition in which a person has a sudden need to pass urine. The person may leak urine if he or she cannot get to the bathroom fast enough (urinary incontinence). A person with this condition may also wake up several times in the night to go to the bathroom. Overactive bladder is associated with poor nerve signals between your bladder and your brain. Your bladder may get the signal to empty before it is full. You may also have very sensitive muscles that make your bladder squeeze too soon. These symptoms might interfere with daily work or social activities. What are the causes? This condition may be associated with or caused by:  Urinary tract infection.  Infection of nearby tissues, such as the prostate.  Prostate enlargement.  Surgery on the uterus or urethra.  Bladder stones, inflammation, or tumors.  Drinking too much caffeine or alcohol.  Certain medicines, especially medicines that get rid of extra fluid in the body (diuretics).  Muscle or nerve weakness, especially from: ? A spinal cord injury. ? Stroke. ? Multiple sclerosis. ? Parkinson's disease.  Diabetes.  Constipation. What increases the risk? You may be at greater risk for overactive bladder if you:  Are an older adult.  Smoke.  Are going through menopause.  Have prostate problems.  Have a neurological disease, such as stroke, dementia, Parkinson's disease, or multiple sclerosis (MS).  Eat or drink things that irritate the bladder. These include alcohol, spicy food, and caffeine.  Are overweight or obese. What are the signs or symptoms? Symptoms of this condition include:  Sudden, strong urge to urinate.  Leaking urine.  Urinating 8 or more times a day.  Waking up to urinate 2 or more times a night. How is this diagnosed? Your health care provider may suspect overactive  bladder based on your symptoms. He or she will diagnose this condition by:  A physical exam and medical history.  Blood or urine tests. You might need bladder or urine tests to help determine what is causing your overactive bladder. You might also need to see a health care provider who specializes in urinary tract problems (urologist). How is this treated? Treatment for overactive bladder depends on the cause of your condition and whether it is mild or severe. You can also make lifestyle changes at home. Options include:  Bladder training. This may include: ? Learning to control the urge to urinate by following a schedule that directs you to urinate at regular intervals (timed voiding). ? Doing Kegel exercises to strengthen your pelvic floor muscles, which support your bladder. Toning these muscles can help you control urination, even if your bladder muscles are overactive.  Special devices. This may include: ? Biofeedback, which uses sensors to help you become aware of your body's signals. ? Electrical stimulation, which uses electrodes placed inside the body (implanted) or outside the body. These electrodes send gentle pulses of electricity to strengthen the nerves or muscles that control the bladder. ? Women may use a plastic device that fits into the vagina and supports the bladder (pessary).  Medicines. ? Antibiotics to treat bladder infection. ? Antispasmodics to stop the bladder from releasing urine at the wrong time. ? Tricyclic antidepressants to relax bladder muscles. ? Injections of botulinum toxin type A directly into the bladder tissue to relax bladder muscles.  Lifestyle changes. This may include: ? Weight loss. Talk to your health  care provider about weight loss methods that would work best for you. ? Diet changes. This may include reducing how much alcohol and caffeine you consume, or drinking fluids at different times of the day. ? Not smoking. Do not use any products that  contain nicotine or tobacco, such as cigarettes and e-cigarettes. If you need help quitting, ask your health care provider.  Surgery. ? A device may be implanted to help manage the nerve signals that control urination. ? An electrode may be implanted to stimulate electrical signals in the bladder. ? A procedure may be done to change the shape of the bladder. This is done only in very severe cases. Follow these instructions at home: Lifestyle  Make any diet or lifestyle changes that are recommended by your health care provider. These may include: ? Drinking less fluid or drinking fluids at different times of the day. ? Cutting down on caffeine or alcohol. ? Doing Kegel exercises. ? Losing weight if needed. ? Eating a healthy and balanced diet to prevent constipation. This may include:  Eating foods that are high in fiber, such as fresh fruits and vegetables, whole grains, and beans.  Limiting foods that are high in fat and processed sugars, such as fried and sweet foods. General instructions  Take over-the-counter and prescription medicines only as told by your health care provider.  If you were prescribed an antibiotic medicine, take it as told by your health care provider. Do not stop taking the antibiotic even if you start to feel better.  Use any implants or pessary as told by your health care provider.  If needed, wear pads to absorb urine leakage.  Keep a journal or log to track how much and when you drink and when you feel the need to urinate. This will help your health care provider monitor your condition.  Keep all follow-up visits as told by your health care provider. This is important. Contact a health care provider if:  You have a fever.  Your symptoms do not get better with treatment.  Your pain and discomfort get worse.  You have more frequent urges to urinate. Get help right away if:  You are not able to control your bladder. Summary  Overactive bladder  refers to a condition in which a person has a sudden need to pass urine.  Several conditions may lead to an overactive bladder.  Treatment for overactive bladder depends on the cause and severity of your condition.  Follow your health care provider's instructions about lifestyle changes, doing Kegel exercises, keeping a journal, and taking medicines. This information is not intended to replace advice given to you by your health care provider. Make sure you discuss any questions you have with your health care provider. Document Released: 02/25/2009 Document Revised: 08/22/2018 Document Reviewed: 05/17/2017 Elsevier Patient Education  2020 [employer].

## 2018-11-28 ENCOUNTER — Telehealth: Payer: Self-pay

## 2018-11-28 NOTE — Telephone Encounter (Signed)

## 2018-11-29 ENCOUNTER — Ambulatory Visit (INDEPENDENT_AMBULATORY_CARE_PROVIDER_SITE_OTHER): Payer: Medicare Other

## 2018-11-29 ENCOUNTER — Telehealth: Payer: Self-pay | Admitting: Nurse Practitioner

## 2018-11-29 ENCOUNTER — Other Ambulatory Visit: Payer: Self-pay

## 2018-11-29 DIAGNOSIS — I4892 Unspecified atrial flutter: Secondary | ICD-10-CM | POA: Diagnosis not present

## 2018-11-29 NOTE — Telephone Encounter (Signed)
I spoke with the patient.  She is aware of her echo results. I have advised her of Ignacia Bayley, NP's recommendations.  She states she is taking lasix 20 mg once daily. She missed her dose yesterday. She states she fell a week ago and it is hard for her to get up and down.  I have advised her to take lasix 20 mg- 2 tablets (40 mg) by mouth once daily x 3 days starting tomorrow, Sunday, & Monday.  She is aware to then resume her normal dose of lasix 20 mg once daily after that.   The patient voices understanding and is agreeable. I have advised her to call back next week if she is not putting out much urine/ SOB is worse.  She is agreeable.

## 2018-11-29 NOTE — Telephone Encounter (Signed)
Notes recorded by Theora Gianotti, NP on 11/29/2018 at 2:21 PM EDT  Normal heart squeezing function. The pressures on the right side of her heart of very elevated. These were elevated in 2017 at the time of her last echo as well, but are higher now. This indicates that she likely has a little too much fluid on board at this time. Has she been taking her lasix 20mg  daily as rx? If yes, she likely needs to take an extra dose x 3 days. If no, then she needs to take it as rx or at least x 3 days and then prn for wt gain of 2 lbs.  Mitral valve is moderately leaky, which is similar to 2017.

## 2018-12-09 ENCOUNTER — Telehealth: Payer: Self-pay | Admitting: Pulmonary Disease

## 2018-12-09 NOTE — Telephone Encounter (Signed)
Pt called and stated that she is not feeling well and would like to come in and be seen. I let pt know that I would note symptoms and speak with nurse to see which visit would be appropriate. Pt has a cough and is coughing up lots of phlegm, SOB at times, no fever that she is ware of.  Has had these symptoms for several weeks now.

## 2018-12-09 NOTE — Telephone Encounter (Signed)
Pt is aware, nothing further needed.

## 2018-12-18 NOTE — Progress Notes (Signed)
   12/19/2018 10:17 AM   Jodi Bullock Jun 30, 1924 [id number]  Reason for visit: Follow up OAB  HPI: Jodi Bullock is a 85 year old female with OAB who presents today for a 4 week follow up after a retrial of Myrbetriq.    Background history (Dr. Doristine Counter note) I saw Jodi Bullock in urology clinic today for follow-up of OAB.  She was previously followed by Zara Council and Dr. Arther Dames, but wanted to be seen by a new provider today.  Her primary complaint is urinary frequency during the day, as well as nocturia 1-3 times per night.  She has a number of comorbidities and has been trialed on numerous medications including Myrbetriq, anticholinergics, and undergone PTNS.  She is on prophylactic trimethoprim for UTIs which has worked well.  She does take Lasix, which she takes at 4 AM to prevent worse nocturia.  Denies any significant urgency or urge incontinence.  She is very bothered by her urinary symptoms and would like to explore any other options.  She denies any gross hematuria or recent UTIs.  She states she had been on Augmentin for chronic bronchitis and has developed a yeast infection.   She is having itching and burning in the vagina and down the inside of her thighs.  She states this is been going on for several days.  She states she has not noticed a rash or discharge.  Patient denies any gross hematuria, dysuria or suprapubic/flank pain.  Patient denies any fevers, chills, nausea or vomiting.   She states that she does feel the Myrbetriq 25 mg daily is helping her with her overactive bladder symptoms and would like to continue that medication.  Her PVR 0 mL and her blood pressure is 143/67.    ROS: Please see flowsheet from today's date for complete review of systems.  Physical Exam: BP (!) 143/67 (BP Location: Left Arm, Patient Position: Sitting)   Pulse 66   Ht 5\' 5"  (1.651 m)   Wt 156 lb (70.8 kg)   BMI 25.96 kg/m   Constitutional:  Well nourished. Alert and oriented, No  acute distress. HEENT: [place] AT, moist mucus membranes.  Trachea midline, no masses. Cardiovascular: No clubbing, cyanosis, or edema. Respiratory: Normal respiratory effort, no increased work of breathing. GI: Abdomen is soft, non tender, non distended, no abdominal masses. Liver and spleen not palpable.  No hernias appreciated.  Stool sample for occult testing is not indicated.   GU: No CVA tenderness.  No bladder fullness or masses.  External genitalia with erythemia, sparse pubic hair distribution, no lesions.  Normal urethral meatus, no lesions, no prolapse, no discharge.   No urethral masses, tenderness and/or tenderness. No bladder fullness, tenderness or masses. Erythremic vagina mucosa, poor estrogen effect, yeast discharge, no lesions, fair pelvic support.  No cervical motion tenderness.  Uterus is freely mobile and non-fixed.  Anus and perineum are without rashes or lesions.    Skin: No rashes, bruises or suspicious lesions. Lymph: No inguinal adenopathy. Neurologic: Grossly intact, no focal deficits, moving all 4 extremities. Psychiatric: Normal mood and affect.   Assessment & Plan:    1. OAB Continue Myrbetriq 25 mg daily RTC in 3 months for PVR and symptom rechecked   2.  Vaginal yeast infection Prescription sent in for Diflucan 150 mg take 1 and then 2 days later take second tablet    Zara Council, PA-C  [facility] [address], [place], [place] [address] [phone]

## 2018-12-19 ENCOUNTER — Telehealth: Payer: Self-pay | Admitting: Urology

## 2018-12-19 ENCOUNTER — Encounter: Payer: Self-pay | Admitting: Urology

## 2018-12-19 ENCOUNTER — Other Ambulatory Visit: Payer: Self-pay

## 2018-12-19 ENCOUNTER — Ambulatory Visit (INDEPENDENT_AMBULATORY_CARE_PROVIDER_SITE_OTHER): Payer: Medicare Other | Admitting: Urology

## 2018-12-19 VITALS — BP 143/67 | HR 66 | Ht 65.0 in | Wt 156.0 lb

## 2018-12-19 DIAGNOSIS — N3281 Overactive bladder: Secondary | ICD-10-CM

## 2018-12-19 DIAGNOSIS — B373 Candidiasis of vulva and vagina: Secondary | ICD-10-CM

## 2018-12-19 DIAGNOSIS — B3731 Acute candidiasis of vulva and vagina: Secondary | ICD-10-CM

## 2018-12-19 LAB — BLADDER SCAN AMB NON-IMAGING: Scan Result: 0

## 2018-12-19 MED ORDER — FLUCONAZOLE 150 MG PO TABS: ORAL_TABLET | ORAL | 0 refills | Status: DC

## 2018-12-19 NOTE — Telephone Encounter (Signed)
Spoke to [place] at [employer], he needed a paper faxed stating patient can keep the medication in her room. I faxed a paper to him with the approval.

## 2018-12-19 NOTE — Telephone Encounter (Signed)
[place] from [employer] called with a question about RX for pt.  Please call him at [phone]

## 2018-12-24 ENCOUNTER — Telehealth: Payer: Self-pay | Admitting: Urology

## 2018-12-24 NOTE — Telephone Encounter (Signed)
Called and spoke with patient, she states she is having urinary frequency, urgency and dysuria. She was added on to [employer], [place] schedule for UTI evaluation

## 2018-12-24 NOTE — Telephone Encounter (Signed)
Please advise 

## 2018-12-24 NOTE — Telephone Encounter (Signed)
Pt called and states that her meds for her yeast infection are not working and would like to know if something else can be called in. Please advise.

## 2018-12-24 NOTE — Telephone Encounter (Signed)
What symptoms is Mrs. Mellor experiencing at this time?

## 2018-12-25 ENCOUNTER — Encounter: Payer: Self-pay | Admitting: Physician Assistant

## 2018-12-25 ENCOUNTER — Ambulatory Visit (INDEPENDENT_AMBULATORY_CARE_PROVIDER_SITE_OTHER): Payer: Medicare Other | Admitting: Physician Assistant

## 2018-12-25 ENCOUNTER — Other Ambulatory Visit: Payer: Self-pay

## 2018-12-25 VITALS — BP 138/57 | HR 79 | Ht 65.0 in | Wt 158.5 lb

## 2018-12-25 DIAGNOSIS — L292 Pruritus vulvae: Secondary | ICD-10-CM | POA: Diagnosis not present

## 2018-12-25 DIAGNOSIS — B3731 Acute candidiasis of vulva and vagina: Secondary | ICD-10-CM

## 2018-12-25 DIAGNOSIS — B373 Candidiasis of vulva and vagina: Secondary | ICD-10-CM | POA: Diagnosis not present

## 2018-12-25 DIAGNOSIS — N3020 Other chronic cystitis without hematuria: Secondary | ICD-10-CM | POA: Diagnosis not present

## 2018-12-25 NOTE — Patient Instructions (Signed)
Purchase an over the counter vaginal anti-itch cream (Monistat brand, for example). Apply that to your itchy areas as needed until Friday. On Friday, call the office at [phone] and let me know if your symptoms have gotten any better. If not, make an appointment to come back to the office and see me.

## 2018-12-25 NOTE — Progress Notes (Signed)
12/25/2018 12:21 PM   Jodi Bullock 1924/06/17 [id number]  CC: Vulvar itching  HPI: Jodi Bullock is a 85 y.o. female who presents today for continued yeast infection symptoms. She is an established [facility] patient who last saw Zara Council on 12/19/2018 for 4-week follow-up after retrial of Myrbetriq.  She was found to have vaginal candidiasis at that time and prescribed Diflucan.  She reports that she took her two tablets of Diflucan on 8/6 and 8/8 as prescribed and describes some slight symptom improvement. Pruritis today limited to the vulva and inner thighs. She reports resolution of vaginal itching. She reports her daughter purchased her a 7-day Monistat treatment yesterday that she applied inside her vagina, however she states that it washed off with urination.   She states she has not taken her prescribed Lasix for five days with concerns that excess urination may worsen her symptoms. She is still taking trimethoprim 100mg  daily for UTI prophylaxis, prescribed by Dr. Matilde Sprang in October 2019.  In-office UA today positive for 1+ protein but otherwise negative.  PMH: Past Medical History:  Diagnosis Date  . Arthritis   . Atrial flutter ([place])    a. Dx 07/2018. CHA2DS2VASc = 5-->Eliquis.  . Bladder incontinence   . Breast cancer ([place]) 1998  . Chronic systolic CHF (congestive heart failure) ([place]) 01/10/2016   a. 09/2015 Echo: EF 40-45%, possible inf HK, mod PAH (PASP 91mmHg).  . Closed fracture of right femur ([place])    a. 08/2017->Managed conservatively.  . Depression   . GERD (gastroesophageal reflux disease)   . History of kidney stones   . HLD (hyperlipidemia)   . Hypertension   . Neuropathy of both feet   . NICM (nonischemic cardiomyopathy) ([place])    a. 08/2015 Lexiscan MV: EF 39%, no ischemia; b. 09/2015 Echo: EF 40-45%.  . Right Humerus head fracture    a. 07/2016 - managed conservatively.  . 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 ABDOMINAL HYSTERECTOMY    . BREAST LUMPECTOMY    . CATARACT EXTRACTION    . CHOLECYSTECTOMY    . JOINT REPLACEMENT Right    Hip  . KNEE SURGERY Right     Home Medications:  Allergies as of 12/25/2018      Reactions   2,4-d Dimethylamine (amisol) Other (See Comments)   Other Reaction: OTHER REACTION-IRRITATION TO Esophagous   Celebrex [celecoxib] Other (See Comments)    esophagitis Esophageal spasms     Ciprofloxacin Other (See Comments)   Esophageal irritation   Ibuprofen    Other reaction(s): Other (See Comments) reflux   Levamisole Other (See Comments), Nausea And Vomiting   Metronidazole Other (See Comments)   neuropathy   Naproxen Other (See Comments)   GI UPSET   Other Other (See Comments)   Other Reaction: esophagitis      Medication List       Accurate as of December 25, 2018 11:59 PM. If you have any questions, ask your nurse or doctor.        acetaminophen 650 MG CR tablet Commonly known as: TYLENOL Take 650 mg by mouth every 8 (eight) hours.   apixaban 5 MG Tabs tablet Commonly known as: Eliquis Take 1 tablet (5 mg total) by mouth 2 (two) times daily.   carvedilol 12.5 MG tablet Commonly known as: COREG Take 1.5 tablets (18.75 mg total) by mouth 2 (two) times daily.   Cholecalciferol 50 MCG (2000  UT) Tabs Take 1 tablet by mouth daily.   docusate sodium 100 MG capsule Commonly known as: COLACE Take 100 mg by mouth daily.   fluconazole 150 MG tablet Commonly known as: DIFLUCAN Take one table today and take the next tablet on 12/21/2018   fluticasone 50 MCG/ACT nasal spray Commonly known as: FLONASE Place 1 spray into both nostrils daily.   Fluticasone Furoate 100 MCG/ACT Aepb Commonly known as: Arnuity Ellipta Inhale 1 puff into the lungs daily.   furosemide 20 MG tablet Commonly known as: LASIX Take 20 mg by mouth daily.   guaiFENesin 600 MG 12 hr tablet Commonly known as: MUCINEX Take by mouth 2 (two)  times daily.   loperamide 2 MG tablet Commonly known as: IMODIUM A-D Take 2 mg by mouth as needed for diarrhea or loose stools.   loratadine 10 MG tablet Commonly known as: Claritin Take 1 tablet (10 mg total) by mouth at bedtime.   LORazepam 0.5 MG tablet Commonly known as: ATIVAN Take 1 tablet (0.5 mg total) by mouth 2 (two) times daily as needed for anxiety.   losartan 100 MG tablet Commonly known as: COZAAR Take 1 tablet (100 mg total) by mouth daily.   lovastatin 40 MG tablet Commonly known as: MEVACOR Take 1 tablet (40 mg total) by mouth daily. What changed: additional instructions   omeprazole 40 MG capsule Commonly known as: PRILOSEC Take 40 mg by mouth daily. Out of med-needs to get OTC from pharmacy   polyethylene glycol powder 17 GM/SCOOP powder Commonly known as: GLYCOLAX/MIRALAX 17 grams a day   PreserVision/Lutein Caps Take 1 each by mouth 2 (two) times daily.   Probiotic 250 MG Caps Take by mouth daily.   traMADol 50 MG tablet Commonly known as: ULTRAM Take 50 mg by mouth as needed.   trimethoprim 100 MG tablet Commonly known as: TRIMPEX Take 1 tablet (100 mg total) by mouth daily.   VITAMIN DEFICIENCY SYSTEM-B12 IJ Inject as directed every 30 (thirty) days.       Allergies:  Allergies  Allergen Reactions  . 2,4-D Dimethylamine (Amisol) Other (See Comments)    Other Reaction: OTHER REACTION-IRRITATION TO Esophagous  . Celebrex [Celecoxib] Other (See Comments)     esophagitis Esophageal spasms    . Ciprofloxacin Other (See Comments)    Esophageal irritation  . Ibuprofen     Other reaction(s): Other (See Comments) reflux  . Levamisole Other (See Comments) and Nausea And Vomiting  . Metronidazole Other (See Comments)    neuropathy  . Naproxen Other (See Comments)    GI UPSET  . Other Other (See Comments)    Other Reaction: esophagitis    Family History: Family History  Problem Relation Age of Onset  . Stroke Mother   . Diabetes  Father   . Heart disease Father   . Kidney disease Neg Hx   . Bladder Cancer Neg Hx   . Kidney cancer Neg Hx     Social History:   reports that she has quit smoking. She has never used smokeless tobacco. She reports that she does not drink alcohol or use drugs.  ROS: UROLOGY Frequent Urination?: No Hard to postpone urination?: No Burning/pain with urination?: No Get up at night to urinate?: Yes Leakage of urine?: No Urine stream starts and stops?: No Trouble starting stream?: No Do you have to strain to urinate?: No Blood in urine?: No Urinary tract infection?: No Sexually transmitted disease?: No Injury to kidneys or bladder?: No Painful intercourse?: No  Weak stream?: No Currently pregnant?: No Vaginal bleeding?: No Last menstrual period?: N/A  Gastrointestinal Nausea?: No Vomiting?: No Indigestion/heartburn?: No Diarrhea?: No Constipation?: No  Constitutional Fever: No Night sweats?: No Weight loss?: No Fatigue?: No  Skin Skin rash/lesions?: Yes Itching?: Yes  Eyes Blurred vision?: No Double vision?: No  Ears/Nose/Throat Sore throat?: No Sinus problems?: No  Hematologic/Lymphatic Swollen glands?: No  Cardiovascular Leg swelling?: No Chest pain?: No  Respiratory Cough?: No Shortness of breath?: No  Endocrine Excessive thirst?: No  Musculoskeletal Back pain?: No Joint pain?: No  Neurological Headaches?: No Dizziness?: No  Psychologic Depression?: No Anxiety?: No  Physical Exam: BP (!) 138/57 (BP Location: Left Arm, Patient Position: Sitting, Cuff Size: Normal)   Pulse 79   Ht 5\' 5"  (1.651 m)   Wt 158 lb 8 oz (71.9 kg)   BMI 26.38 kg/m   Constitutional:  Alert and oriented, no acute distress, nontoxic appearing HEENT: [place], AT Cardiovascular: No clubbing, cyanosis, or edema Respiratory: Normal respiratory effort, no increased work of breathing Skin: No rashes, bruises or suspicious lesions Neurologic: Grossly intact, no focal  deficits, moving all 4 extremities Psychiatric: Normal mood and affect  Laboratory Data: Results for orders placed or performed in visit on 12/25/18  Microscopic Examination   URINE  Result Value Ref Range   WBC, UA 0-5 0 - 5 /hpf   RBC None seen 0 - 2 /hpf   Epithelial Cells (non renal) 0-10 0 - 10 /hpf   Renal Epithel, UA 0-10 (A) None seen /hpf   Bacteria, UA Few None seen/Few  Urinalysis, Complete  Result Value Ref Range   Specific Gravity, UA >1.030 (H) 1.005 - 1.030   pH, UA 5.5 5.0 - 7.5   Color, UA Yellow Yellow   Appearance Ur Cloudy (A) Clear   Leukocytes,UA Negative Negative   Protein,UA 1+ (A) Negative/Trace   Glucose, UA Negative Negative   Ketones, UA Negative Negative   RBC, UA Negative Negative   Bilirubin, UA Negative Negative   Urobilinogen, Ur 1.0 0.2 - 1.0 mg/dL   Nitrite, UA Negative Negative   Microscopic Examination See below:    Assessment & Plan:   1. Vulvar itching Patient completed a course of prescribed Diflucan for vaginal candidiasis 4 days ago.  She reports some symptom resolution, namely of her vaginal pruritus.  I suspect that her continued vulvar pruritus will continue to resolve over the coming days.  UA reassuring for infection, will defer culture at this time.  I explained to the patient that the symptoms of yeast infections may take up to a week to resolve following the completion of Diflucan.  I explained that I expect her symptoms to resolve on her own and I am hesitant to prescribe more medication at this time.  She expressed an understanding of this plan and noted that she wondered if that were the case.    I counseled her to obtain an over-the-counter vaginal anti-itch cream and use it topically as needed between now and Friday.  I counseled her to contact the office on Friday morning if her symptoms had not improved by then.  If her symptoms persist at that time, I will have her return to the office for follow-up pelvic exam and possible  retreatment of her candidiasis. -Urinalysis, Complete -Patient counseled to follow-up with [name] via telephone on Friday morning with status of her symptom resolution  Jodi Loop, PA-C  [facility] [address] [place], [place] [address] [phone]  [phone]    

## 2018-12-26 LAB — MICROSCOPIC EXAMINATION: RBC: NONE SEEN /hpf (ref 0–2)

## 2018-12-26 LAB — URINALYSIS, COMPLETE
Bilirubin, UA: NEGATIVE
Glucose, UA: NEGATIVE
Ketones, UA: NEGATIVE
Leukocytes,UA: NEGATIVE
Nitrite, UA: NEGATIVE
RBC, UA: NEGATIVE
Specific Gravity, UA: >1.03 — ABNORMAL HIGH (ref 1.005–1.030)
Urobilinogen, Ur: 1 mg/dL (ref 0.2–1.0)
pH, UA: 5.5 (ref 5.0–7.5)

## 2018-12-27 ENCOUNTER — Telehealth (INDEPENDENT_AMBULATORY_CARE_PROVIDER_SITE_OTHER): Payer: Medicare Other | Admitting: Physician Assistant

## 2018-12-27 MED ORDER — NYSTATIN-TRIAMCINOLONE 100000-0.1 UNIT/GM-% EX OINT: 1.0000 "application " | TOPICAL_OINTMENT | Freq: Two times a day (BID) | CUTANEOUS | 0 refills | Status: DC

## 2018-12-27 NOTE — Telephone Encounter (Signed)
Pt called and states she is not feeling any better since her appt on Wednesday, she was wanting something else called in. Please advise.

## 2018-12-27 NOTE — Telephone Encounter (Signed)
LMOM for patient to return call.

## 2018-12-27 NOTE — Telephone Encounter (Signed)
Pt. Called crying stating she is in a lot of pain. Lots of burning and she would like something called in as soon as possible.

## 2018-12-27 NOTE — Telephone Encounter (Signed)
Please contact her and let her know that I've prescribed a topical antifungal cream that she can apply twice daily where she is itching. Please advise her to follow up with her gynecologist for further evaluation.  Debroah Loop, PA-C 12/27/18 3:06 PM

## 2018-12-30 ENCOUNTER — Other Ambulatory Visit: Payer: Self-pay

## 2018-12-30 MED ORDER — TRIAMCINOLONE ACETONIDE 0.5 % EX OINT: 1.0000 "application " | TOPICAL_OINTMENT | Freq: Two times a day (BID) | CUTANEOUS | 0 refills | Status: DC

## 2018-12-30 MED ORDER — NYSTATIN 100000 UNIT/GM EX OINT: 1.0000 "application " | TOPICAL_OINTMENT | Freq: Two times a day (BID) | CUTANEOUS | 0 refills | Status: DC

## 2018-12-30 NOTE — Progress Notes (Signed)
Prior auth needed on compounded mycolog cream. Script sent separately for insurance approval

## 2018-12-30 NOTE — Telephone Encounter (Signed)
Left VM to return call 

## 2019-01-01 ENCOUNTER — Encounter: Payer: Self-pay | Admitting: *Deleted

## 2019-01-01 ENCOUNTER — Telehealth: Payer: Self-pay | Admitting: *Deleted

## 2019-01-01 NOTE — Telephone Encounter (Signed)
Letter with results of lab work from 10/31/18 mailed to patient to let her know they were normal.

## 2019-01-01 NOTE — Telephone Encounter (Signed)
-----   Message from Deberah Pelton, NP sent at 01/01/2019 11:31 AM EDT ----- Regarding: RE: Labs Hi Jenn,   Thank you for the message. Everything looks good with regards to her labs. They can be officially resulted. Can you please call her and let her know that her labs are normal.   Thank you,   Denyse Amass  ----- Message ----- From: Vanessa Ralphs, RN Sent: 01/01/2019  11:05 AM EDT To: Theora Gianotti, NP, # Subject: Labs                                           Good morning,  In doing follow up, I found this patient's labs had not been officially resulted. They look stable just wanted to see if they needed to be addressed.   Thanks so very much! Danise Mina

## 2019-01-06 ENCOUNTER — Encounter: Payer: Self-pay | Admitting: Emergency Medicine

## 2019-01-06 ENCOUNTER — Other Ambulatory Visit: Payer: Self-pay

## 2019-01-06 ENCOUNTER — Emergency Department: Payer: Medicare Other

## 2019-01-06 ENCOUNTER — Emergency Department
Admission: EM | Admit: 2019-01-06 | Discharge: 2019-01-06 | Disposition: A | Discharge status: Home / Self Care | Payer: Medicare Other | Attending: Emergency Medicine | Admitting: Emergency Medicine

## 2019-01-06 DIAGNOSIS — Z87891 Personal history of nicotine dependence: Secondary | ICD-10-CM | POA: Diagnosis not present

## 2019-01-06 DIAGNOSIS — I5022 Chronic systolic (congestive) heart failure: Secondary | ICD-10-CM | POA: Diagnosis not present

## 2019-01-06 DIAGNOSIS — R102 Pelvic and perineal pain unspecified side: Secondary | ICD-10-CM

## 2019-01-06 DIAGNOSIS — R300 Dysuria: Secondary | ICD-10-CM | POA: Diagnosis present

## 2019-01-06 DIAGNOSIS — M79604 Pain in right leg: Secondary | ICD-10-CM

## 2019-01-06 DIAGNOSIS — I110 Hypertensive heart disease with heart failure: Secondary | ICD-10-CM | POA: Diagnosis not present

## 2019-01-06 DIAGNOSIS — Z79899 Other long term (current) drug therapy: Secondary | ICD-10-CM | POA: Insufficient documentation

## 2019-01-06 DIAGNOSIS — Z7901 Long term (current) use of anticoagulants: Secondary | ICD-10-CM | POA: Insufficient documentation

## 2019-01-06 LAB — BASIC METABOLIC PANEL
Anion gap: 9 (ref 5–15)
BUN: 14 mg/dL (ref 8–23)
CO2: 24 mmol/L (ref 22–32)
Calcium: 9 mg/dL (ref 8.9–10.3)
Chloride: 110 mmol/L (ref 98–111)
Creatinine, Ser: 0.68 mg/dL (ref 0.44–1.00)
GFR calc Af Amer: >60 mL/min (ref >60)
GFR calc non Af Amer: >60 mL/min (ref >60)
Glucose, Bld: 171 mg/dL — ABNORMAL HIGH (ref 70–99)
Potassium: 4.3 mmol/L (ref 3.5–5.1)
Sodium: 143 mmol/L (ref 135–145)

## 2019-01-06 LAB — URINALYSIS, COMPLETE (UACMP) WITH MICROSCOPIC
Bacteria, UA: NONE SEEN
Bilirubin Urine: NEGATIVE
Glucose, UA: NEGATIVE mg/dL
Hgb urine dipstick: NEGATIVE
Ketones, ur: NEGATIVE mg/dL
Leukocytes,Ua: NEGATIVE
Nitrite: NEGATIVE
Protein, ur: NEGATIVE mg/dL
Specific Gravity, Urine: 1.005 (ref 1.005–1.030)
WBC, UA: NONE SEEN WBC/hpf (ref 0–5)
pH: 6 (ref 5.0–8.0)

## 2019-01-06 LAB — CBC WITH DIFFERENTIAL/PLATELET
Abs Immature Granulocytes: 0.05 10*3/uL (ref 0.00–0.07)
Basophils Absolute: 0 10*3/uL (ref 0.0–0.1)
Basophils Relative: 1 %
Eosinophils Absolute: 0.2 10*3/uL (ref 0.0–0.5)
Eosinophils Relative: 3 %
HCT: 39.7 % (ref 36.0–46.0)
Hemoglobin: 12.2 g/dL (ref 12.0–15.0)
Immature Granulocytes: 1 %
Lymphocytes Relative: 17 %
Lymphs Abs: 1.1 10*3/uL (ref 0.7–4.0)
MCH: 29.7 pg (ref 26.0–34.0)
MCHC: 30.7 g/dL (ref 30.0–36.0)
MCV: 96.6 fL (ref 80.0–100.0)
Monocytes Absolute: 0.5 10*3/uL (ref 0.1–1.0)
Monocytes Relative: 8 %
Neutro Abs: 4.6 10*3/uL (ref 1.7–7.7)
Neutrophils Relative %: 70 %
Platelets: 214 10*3/uL (ref 150–400)
RBC: 4.11 MIL/uL (ref 3.87–5.11)
RDW: 13.1 % (ref 11.5–15.5)
WBC: 6.5 10*3/uL (ref 4.0–10.5)
nRBC: 0 % (ref 0.0–0.2)

## 2019-01-06 LAB — WET PREP, GENITAL
Clue Cells Wet Prep HPF POC: NONE SEEN
Sperm: NONE SEEN
Trich, Wet Prep: NONE SEEN
Yeast Wet Prep HPF POC: NONE SEEN

## 2019-01-06 MED ORDER — FLUCONAZOLE 150 MG PO TABS: 150.0000 mg | ORAL_TABLET | Freq: Once | ORAL | 0 refills | Status: AC

## 2019-01-06 MED ORDER — FLUCONAZOLE 50 MG PO TABS
150.0000 mg | ORAL_TABLET | Freq: Once | ORAL | Status: AC
  Administered 2019-01-06: 150 mg via ORAL
  Filled 2019-01-06: qty 1

## 2019-01-06 NOTE — ED Provider Notes (Signed)
[facility] Emergency Department Provider Note   ____________________________________________   First MD Initiated Contact with Patient 01/06/19 0901     (approximate)  I have reviewed the triage vital signs and the nursing notes.   HISTORY  Chief Complaint Dysuria    HPI Jodi Bullock is a 85 y.o. female with past medical history of atrial flutter on Eliquis, CHF, hypertension, and urinary incontinence who presents to the ED complaining of dysuria.  Patient reports she has had at least 1 week of burning when she urinates as well as a burning discomfort in the area of her bladder.  She denies any fevers, chills, flank pain, cough, chest pain, or shortness of breath.  She was initially diagnosed with a yeast infection, completed a course of Diflucan with some improvement but then had recurrence of symptoms.  She states she has been using a vaginal cream prescribed by her urologist for the past week without significant improvement.  She was told her UA was unremarkable during her last visit to urology.        Past Medical History:  Diagnosis Date  . Arthritis   . Atrial flutter ([place])    a. Dx 07/2018. CHA2DS2VASc = 5-->Eliquis.  . Bladder incontinence   . Breast cancer ([place]) 1998  . Chronic systolic CHF (congestive heart failure) ([place]) 01/10/2016   a. 09/2015 Echo: EF 40-45%, possible inf HK, mod PAH (PASP 41mHg).  . Closed fracture of right femur ([place]    a. 08/2017->Managed conservatively.  . Depression   . GERD (gastroesophageal reflux disease)   . History of kidney stones   . HLD (hyperlipidemia)   . Hypertension   . Neuropathy of both feet   . NICM (nonischemic cardiomyopathy) ([place]    a. 08/2015 Lexiscan MV: EF 39%, no ischemia; b. 09/2015 Echo: EF 40-45%.  . Right Humerus head fracture    a. 07/2016 - managed conservatively.  . 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 Femur fracture ([place]  09/09/2017  . Abnormal gait 12/19/2016  . Acquired spondylolisthesis 12/19/2016  . Breast lump 12/19/2016  . Cervical spondylosis without myelopathy 12/19/2016  . Closed Colles' fracture 12/19/2016  . Closed fracture of base of neck of femur ([place] 12/19/2016  . Closed fracture of lower end of radius and ulna 12/19/2016  . Closed fracture of neck of femur ([place] 12/19/2016  . Spondylolisthesis, congenital 12/19/2016  . Contracture of joint of hand 12/19/2016  . Contracture of wrist joint 12/19/2016  . Contusion of hand 12/19/2016  . Contusion of hip 12/19/2016  . Contusion of knee 12/19/2016  . Degeneration of intervertebral disc at C4-C5 level 12/19/2016  . Degeneration of lumbar intervertebral disc 12/19/2016  . Disorder of coccyx 12/19/2016  . Dry eyes 12/19/2016  . Enthesopathy 12/19/2016  . Enthesopathy of hip region 12/19/2016  . Hand joint pain 12/19/2016  . Hand joint stiff 12/19/2016  . Hip pain 12/19/2016  . Joint pain 12/19/2016  . Knee pain 12/19/2016  . Localized, primary osteoarthritis 12/19/2016  . Loose body in hip joint 12/19/2016  . Lumbar sprain 12/19/2016  . Abnormal mammogram 12/19/2016  . Meibomian gland dysfunction 12/19/2016  . Muscle weakness 12/19/2016  . Open fracture of lower end of forearm 12/19/2016  . Presbyopia 12/19/2016  . Regular astigmatism 12/19/2016  . Shoulder joint pain 12/19/2016  . Skin sensation disturbance 12/19/2016  . Sprain of ankle 12/19/2016  . Trochanteric bursitis  12/19/2016  . Wrist joint pain 12/19/2016  . Stiffness of wrist joint 12/19/2016  . Acute bilateral low back pain without sciatica 09/29/2016  . Acute bronchitis 06/19/2016  . Recurrent productive cough 06/14/2016  . Medicare annual wellness visit, subsequent 05/17/2016  . Fall in elderly patient 01/27/2016  . Sprain of wrist, right 01/26/2016  . Systolic CHF with reduced left ventricular function, NYHA class 3 ([place]) 01/10/2016  . Productive cough 11/19/2015   . Chronic systolic heart failure ([place]) 10/12/2015  . Dyspnea on exertion 08/31/2015  . Fatigue 08/31/2015  . 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 Irregular heart rhythm 07/22/2015  . Left arm pain 06/29/2015  . Dyslipidemia 06/03/2015  . Acute recurrent maxillary sinusitis 06/03/2015  . Oral mucosal lesion 05/26/2015  . Urge incontinence 02/14/2015  . Ankle edema 02/02/2015  . At risk for falling 01/25/2015  . Dizziness 01/25/2015  . Acid reflux 01/25/2015  . Big thyroid 01/25/2015  . Gravida 2 para 2 01/25/2015  . Personal history of malignant neoplasm of breast 01/25/2015  . Arthritis, degenerative 01/25/2015  . Parity 2 01/25/2015  . Peripheral blood vessel disorder ([place]) 01/25/2015  . Need for vaccination 01/25/2015  . Pain in rectum 01/25/2015  . Reflux 01/25/2015  . Screening for depression 01/25/2015  . Disease of accessory sinus 01/25/2015  . Headache, temporal 01/25/2015  . Primary cancer of thymus ([place]) 01/25/2015  . Disease of thyroid gland 01/25/2015  . Avitaminosis D 01/25/2015  . Family history of diabetes mellitus in father 12/31/2014  . Fasting hyperglycemia 12/31/2014  . Hypertension 12/01/2014  . HLD (hyperlipidemia) 12/01/2014  . Anxiety 12/01/2014  . Myofascial pain 12/01/2014  . Breast CA ([place]) 11/24/2014  . Absence of bladder continence 11/24/2014  . Urgency of micturation 11/24/2014  . Common bile duct dilatation 10/20/2014  . Pancreatic cyst 10/20/2014  . Kidney stones 10/20/2014  . Neuritis or radiculitis due to rupture of lumbar intervertebral disc 06/15/2014  . Lumbar canal stenosis 06/15/2014  . Degenerative arthritis of lumbar spine 06/15/2014  . Lumbar radiculitis 06/15/2014  . Osteoarthritis of spine with radiculopathy, lumbar region 06/15/2014  . 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 Bloodgood disease 11/05/2012  . Arthritis of temporomandibular joint 06/24/2012  . Atypical chest pain 01/05/2012  . Breath shortness 12/04/2011   . Lung mass 11/15/2011  . Thyroid nodule 11/15/2011  . Age-related macular degeneration, dry 10/03/2011  . Disorder of peripheral nervous system 10/03/2011  . Chronic rhinitis 03/03/2011  . Carotid artery narrowing 03/01/2011  . Polypharmacy 12/02/2010    Past Surgical History:  Procedure Laterality Date  . ABDOMINAL HYSTERECTOMY    . BREAST LUMPECTOMY    . CATARACT EXTRACTION    . CHOLECYSTECTOMY    . JOINT REPLACEMENT Right    Hip  . KNEE SURGERY Right     Prior to Admission medications   Medication Sig Start Date End Date Taking? Authorizing Provider  acetaminophen (TYLENOL) 500 MG tablet Take 500 mg by mouth every 4 (four) hours as needed for mild pain or fever.   Yes [provider]  acetaminophen (TYLENOL) 650 MG CR tablet Take 650 mg by mouth 3 (three) times daily.   Yes [provider]  alum & mag hydroxide-simeth (MINTOX) 200-200-20 MG/5ML suspension Take 30 mLs by mouth at bedtime as needed for indigestion or heartburn.   Yes [provider]  apixaban (ELIQUIS) 5 MG TABS tablet Take 1 tablet (5 mg total) by mouth 2 (two) times daily. 07/19/18  Yes Murray Hodgkins  Jori Moll, NP  carbamide peroxide (GLY-OXIDE) 10 % solution Place 1 application onto teeth 4 (four) times daily -  with meals and at bedtime. (as needed for dry mouth)   Yes [provider]  carvedilol (COREG) 12.5 MG tablet Take 1.5 tablets (18.75 mg total) by mouth 2 (two) times daily. 02/22/18 09/19/27 Yes Theora Gianotti, NP  Cholecalciferol 2000 UNITS TABS Take 2,000 Units by mouth daily.    Yes Roselee Nova, MD  Cyanocobalamin (VITAMIN DEFICIENCY SYSTEM-B12) 1000 MCG/ML KIT Inject 1,000 mcg as directed every 30 (thirty) days.    Yes [provider]  diclofenac sodium (VOLTAREN) 1 % GEL Apply 2 g topically 2 (two) times daily as needed (knee pain).   Yes [provider]  docusate sodium (COLACE) 100 MG capsule Take 100 mg by mouth at bedtime.     Yes [provider]  fluticasone (FLONASE) 50 MCG/ACT nasal spray Place 1 spray into both nostrils daily. 08/30/17  Yes Wilhelmina Mcardle, MD  Fluticasone Furoate (ARNUITY ELLIPTA) 100 MCG/ACT AEPB Inhale 1 puff into the lungs daily. 08/30/17  Yes Wilhelmina Mcardle, MD  furosemide (LASIX) 20 MG tablet Take 20 mg by mouth 2 (two) times daily.    Yes [provider]  guaiFENesin (MUCINEX) 600 MG 12 hr tablet Take 600 mg by mouth 2 (two) times daily as needed for cough or to loosen phlegm.    Yes [provider]  guaiFENesin (ROBITUSSIN) 100 MG/5ML SOLN Take 10 mLs by mouth every 6 (six) hours as needed for cough or to loosen phlegm.   Yes [provider]  lidocaine (LIDODERM) 5 % Place 1 patch onto the skin daily. Remove & Discard patch within 12 hours or as directed by MD   Yes [provider]  Lidocaine 4 % PTCH Apply 1 patch topically at bedtime.   Yes [provider]  loperamide (IMODIUM A-D) 2 MG tablet Take 2 mg by mouth as needed for diarrhea or loose stools (max 4 doses daily).    Yes [provider]  LORazepam (ATIVAN) 0.5 MG tablet Take 1 tablet (0.5 mg total) by mouth 2 (two) times daily as needed for anxiety. Patient taking differently: Take 0.5 mg by mouth 2 (two) times daily.  05/23/17  Yes Roselee Nova, MD  losartan (COZAAR) 100 MG tablet Take 1 tablet (100 mg total) by mouth daily. 05/23/17  Yes Roselee Nova, MD  lovastatin (MEVACOR) 40 MG tablet Take 1 tablet (40 mg total) by mouth daily. Patient taking differently: Take 40 mg by mouth every Monday, Wednesday, and Friday.  05/23/17  Yes Roselee Nova, MD  Lutein 10 MG TABS Take 10 mg by mouth 2 (two) times daily.   Yes [provider]  magnesium hydroxide (MILK OF MAGNESIA) 400 MG/5ML suspension Take 30 mLs by mouth at bedtime as needed for mild constipation.   Yes [provider]  mirabegron ER (MYRBETRIQ) 25 MG TB24 tablet Take 25 mg by mouth every  evening.   Yes [provider]  neomycin-bacitracin-polymyxin (NEOSPORIN) [phone] ointment Apply topically 4 (four) times daily as needed (wound care).   Yes [provider]  polyethylene glycol (MIRALAX / GLYCOLAX) 17 g packet Take 17 g by mouth daily.    Yes [provider]  Saccharomyces boulardii (PROBIOTIC) 250 MG CAPS Take 1 capsule by mouth daily.    Yes [provider]  traMADol (ULTRAM) 50 MG tablet Take 50 mg by  mouth daily.    Yes [provider]  traMADol (ULTRAM) 50 MG tablet Take 50 mg by mouth every 8 (eight) hours as needed for moderate pain.   Yes [provider]  trimethoprim (TRIMPEX) 100 MG tablet Take 1 tablet (100 mg total) by mouth daily. 03/04/18  Yes MacDiarmid, Nicki Reaper, MD  fluconazole (DIFLUCAN) 150 MG tablet Take one table today and take the next tablet on 12/21/2018 Patient not taking: Reported on 01/06/2019 12/19/18   Zara Council A, PA-C  fluconazole (DIFLUCAN) 150 MG tablet Take 1 tablet (150 mg total) by mouth once for 1 dose. Take on 8/27 if you continue to have symptoms 01/09/19 01/09/19  Blake Divine, MD  loratadine (CLARITIN) 10 MG tablet Take 1 tablet (10 mg total) by mouth at bedtime. Patient not taking: Reported on 01/06/2019 08/30/17 09/19/27  Wilhelmina Mcardle, MD  nystatin ointment (MYCOSTATIN) Apply 1 application topically 2 (two) times daily. Patient not taking: Reported on 01/06/2019 12/30/18   Debroah Loop, PA-C  nystatin-triamcinolone ointment (MYCOLOG) Apply 1 application topically 2 (two) times daily. Patient not taking: Reported on 01/06/2019 12/27/18   Debroah Loop, PA-C  triamcinolone ointment (KENALOG) 0.5 % Apply 1 application topically 2 (two) times daily. Patient not taking: Reported on 01/06/2019 12/30/18   Debroah Loop, PA-C    Allergies 2,4-d dimethylamine (amisol); Celebrex [celecoxib]; Ciprofloxacin; Ibuprofen; Levamisole; Metronidazole; Naproxen; and Other   Family History  Problem Relation Age of Onset  . Stroke Mother   . Diabetes Father   . Heart disease Father   . Kidney disease Neg Hx   . Bladder Cancer Neg Hx   . Kidney cancer Neg Hx     Social History Social History   Tobacco Use  . Smoking status: Former Research scientist (life sciences)  . Smokeless tobacco: Never Used  . Tobacco comment: smoked in high school for a few years only  Substance Use Topics  . Alcohol use: No    Alcohol/week: 0.0 standard drinks  . Drug use: No    Review of Systems  Constitutional: No fever/chills Eyes: No visual changes. ENT: No sore throat. Cardiovascular: Denies chest pain. Respiratory: Denies shortness of breath. Gastrointestinal: Positive for abdominal pain.  No nausea, no vomiting.  No diarrhea.  No constipation. Genitourinary: Positive for dysuria. Musculoskeletal: Negative for back pain. Skin: Negative for rash. Neurological: Negative for headaches, focal weakness or numbness.  ____________________________________________   PHYSICAL EXAM:  VITAL SIGNS: ED Triage Vitals  Enc Vitals Group     BP      Pulse      Resp      Temp      Temp src      SpO2      Weight      Height      Head Circumference      Peak Flow      Pain Score      Pain Loc      Pain Edu?      Excl. in [place]?     Constitutional: Alert and oriented. Eyes: Conjunctivae are normal. Head: Atraumatic. Nose: No congestion/rhinnorhea. Mouth/Throat: Mucous membranes are moist. Neck: Normal ROM Cardiovascular: Normal rate, regular rhythm. Grossly normal heart sounds. Respiratory: Normal respiratory effort.  No retractions. Lungs CTAB. Gastrointestinal: Soft and with mild suprapubic discomfort but no tenderness. No distention. Genitourinary: deferred Musculoskeletal: No lower extremity tenderness nor edema. Neurologic:  Normal speech and language. No gross focal neurologic deficits are appreciated. Skin:  Skin is warm, dry and intact. No rash  noted. Psychiatric: Mood and  affect are normal. Speech and behavior are normal.  ____________________________________________   LABS (all labs ordered are listed, but only abnormal results are displayed)  Labs Reviewed  WET PREP, GENITAL - Abnormal; Notable for the following components:      Result Value   WBC, Wet Prep HPF POC RARE (*)    All other components within normal limits  BASIC METABOLIC PANEL - Abnormal; Notable for the following components:   Glucose, Bld 171 (*)    All other components within normal limits  URINALYSIS, COMPLETE (UACMP) WITH MICROSCOPIC - Abnormal; Notable for the following components:   Color, Urine STRAW (*)    APPearance CLEAR (*)    All other components within normal limits  CBC WITH DIFFERENTIAL/PLATELET     PROCEDURES  Procedure(s) performed (including Critical Care):  Procedures   ____________________________________________   INITIAL IMPRESSION / ASSESSMENT AND PLAN / ED COURSE       85 year old female presenting to the ED for dysuria and suprapubic burning, recently treated for yeast infection without any improvement in symptoms.  Will assess for UTI and check wet prep, but patient appears overall well with no concern for pyelonephritis or respiratory infection.  UA without evidence of UTI, wet prep unremarkable however there is the possibility of poor sampling given self swab.  Will treat for presumed ongoing yeast infection, patient given dose of fluconazole now and counseled to take second dose in 3 days.  Also counseled patient to continue vaginal cream.  Patient now additionally complaining of pain in her right thigh, 2+ DP pulses bilaterally, leg is warm and well-perfused.  She does have some mild edema in that leg, will assess for DVT.  Do not suspect rhabdomyolysis as renal function within normal limits.  Ultrasound negative for DVT, patient appropriate for further management as an outpatient.  Counseled to return to the ED for new or worsening symptoms,  patient agrees with plan.      ____________________________________________   FINAL CLINICAL IMPRESSION(S) / ED DIAGNOSES  Final diagnoses:  Pelvic pain  Dysuria  Pain of right lower extremity     ED Discharge Orders         Ordered    fluconazole (DIFLUCAN) 150 MG tablet   Once     01/06/19 1122           Note:  This document was prepared using Dragon voice recognition software and may include unintentional dictation errors.   Blake Divine, MD 01/06/19 [phone]

## 2019-01-06 NOTE — ED Notes (Signed)
[place] to inform them she is ready for discharge. They will come and pick her up instead of EMS transporting. Did not get an answer when transferred to the nurse, was told by the secretary that they will call me back. Per Network engineer they should be here around 1:45 for pick up

## 2019-01-06 NOTE — ED Triage Notes (Signed)
Per EMS patient from [facility] with temp 99.3 at nursing home. Patient arrives alert and oriented with c/o burning with urination and history of yeast infection. Denies cough or dyspnea.

## 2019-01-06 NOTE — ED Notes (Signed)
Pt dressed and assistanced to wheelchair for discharge.

## 2019-01-06 NOTE — ED Notes (Addendum)
Pt assisted to bedpan, states she can walk but only with a walker and does not feel comfortable ambulating without it. Pt now otf for imaging

## 2019-01-17 ENCOUNTER — Encounter: Payer: Self-pay | Admitting: Emergency Medicine

## 2019-01-17 ENCOUNTER — Other Ambulatory Visit: Payer: Self-pay

## 2019-01-17 ENCOUNTER — Emergency Department
Admission: EM | Admit: 2019-01-17 | Discharge: 2019-01-17 | Disposition: A | Discharge status: Home / Self Care | Payer: Medicare Other | Attending: Emergency Medicine | Admitting: Emergency Medicine

## 2019-01-17 ENCOUNTER — Emergency Department: Payer: Medicare Other

## 2019-01-17 DIAGNOSIS — I5022 Chronic systolic (congestive) heart failure: Secondary | ICD-10-CM | POA: Diagnosis not present

## 2019-01-17 DIAGNOSIS — R1031 Right lower quadrant pain: Secondary | ICD-10-CM | POA: Insufficient documentation

## 2019-01-17 DIAGNOSIS — L03115 Cellulitis of right lower limb: Secondary | ICD-10-CM | POA: Diagnosis not present

## 2019-01-17 DIAGNOSIS — Z87891 Personal history of nicotine dependence: Secondary | ICD-10-CM | POA: Diagnosis not present

## 2019-01-17 DIAGNOSIS — Z7901 Long term (current) use of anticoagulants: Secondary | ICD-10-CM | POA: Diagnosis not present

## 2019-01-17 DIAGNOSIS — Z96641 Presence of right artificial hip joint: Secondary | ICD-10-CM | POA: Insufficient documentation

## 2019-01-17 DIAGNOSIS — I110 Hypertensive heart disease with heart failure: Secondary | ICD-10-CM | POA: Diagnosis not present

## 2019-01-17 DIAGNOSIS — M79604 Pain in right leg: Secondary | ICD-10-CM | POA: Diagnosis present

## 2019-01-17 DIAGNOSIS — Z79899 Other long term (current) drug therapy: Secondary | ICD-10-CM | POA: Insufficient documentation

## 2019-01-17 LAB — CBC WITH DIFFERENTIAL/PLATELET
Abs Immature Granulocytes: 0.04 10*3/uL (ref 0.00–0.07)
Basophils Absolute: 0.1 10*3/uL (ref 0.0–0.1)
Basophils Relative: 1 %
Eosinophils Absolute: 0.3 10*3/uL (ref 0.0–0.5)
Eosinophils Relative: 4 %
HCT: 40.9 % (ref 36.0–46.0)
Hemoglobin: 12.7 g/dL (ref 12.0–15.0)
Immature Granulocytes: 1 %
Lymphocytes Relative: 17 %
Lymphs Abs: 1.2 10*3/uL (ref 0.7–4.0)
MCH: 29.7 pg (ref 26.0–34.0)
MCHC: 31.1 g/dL (ref 30.0–36.0)
MCV: 95.6 fL (ref 80.0–100.0)
Monocytes Absolute: 0.8 10*3/uL (ref 0.1–1.0)
Monocytes Relative: 12 %
Neutro Abs: 4.5 10*3/uL (ref 1.7–7.7)
Neutrophils Relative %: 65 %
Platelets: 189 10*3/uL (ref 150–400)
RBC: 4.28 MIL/uL (ref 3.87–5.11)
RDW: 13.1 % (ref 11.5–15.5)
WBC: 6.8 10*3/uL (ref 4.0–10.5)
nRBC: 0 % (ref 0.0–0.2)

## 2019-01-17 LAB — WET PREP, GENITAL
Clue Cells Wet Prep HPF POC: NONE SEEN
Sperm: NONE SEEN
Trich, Wet Prep: NONE SEEN
Yeast Wet Prep HPF POC: NONE SEEN

## 2019-01-17 LAB — COMPREHENSIVE METABOLIC PANEL
ALT: 14 U/L (ref 0–44)
AST: 18 U/L (ref 15–41)
Albumin: 3.6 g/dL (ref 3.5–5.0)
Alkaline Phosphatase: 103 U/L (ref 38–126)
Anion gap: 9 (ref 5–15)
BUN: 19 mg/dL (ref 8–23)
CO2: 28 mmol/L (ref 22–32)
Calcium: 9 mg/dL (ref 8.9–10.3)
Chloride: 103 mmol/L (ref 98–111)
Creatinine, Ser: 0.73 mg/dL (ref 0.44–1.00)
GFR calc Af Amer: >60 mL/min (ref >60)
GFR calc non Af Amer: >60 mL/min (ref >60)
Glucose, Bld: 124 mg/dL — ABNORMAL HIGH (ref 70–99)
Potassium: 4.3 mmol/L (ref 3.5–5.1)
Sodium: 140 mmol/L (ref 135–145)
Total Bilirubin: 0.3 mg/dL (ref 0.3–1.2)
Total Protein: 6 g/dL — ABNORMAL LOW (ref 6.5–8.1)

## 2019-01-17 LAB — URINALYSIS, ROUTINE W REFLEX MICROSCOPIC
Bilirubin Urine: NEGATIVE
Glucose, UA: NEGATIVE mg/dL
Hgb urine dipstick: NEGATIVE
Ketones, ur: NEGATIVE mg/dL
Leukocytes,Ua: NEGATIVE
Nitrite: NEGATIVE
Protein, ur: NEGATIVE mg/dL
Specific Gravity, Urine: 1.017 (ref 1.005–1.030)
pH: 6 (ref 5.0–8.0)

## 2019-01-17 LAB — CK: Total CK: 49 U/L (ref 38–234)

## 2019-01-17 MED ORDER — FLUCONAZOLE 150 MG PO TABS: 150.0000 mg | ORAL_TABLET | Freq: Every day | ORAL | 0 refills | Status: AC

## 2019-01-17 MED ORDER — CEPHALEXIN 250 MG PO CAPS: 250.0000 mg | ORAL_CAPSULE | Freq: Four times a day (QID) | ORAL | 0 refills | Status: AC

## 2019-01-17 NOTE — ED Notes (Signed)
Pt signed paper copy of discharge 

## 2019-01-17 NOTE — ED Triage Notes (Signed)
Pt arrives with complaints of right leg/hip pain. Pt has hx of same pain but reports today unrelieved with prescribed pain mediation. No deformity noticed. No new falls. Pt states " I am here because my doctor told me if the pain medication I take wasn't working then I needed to come back."

## 2019-01-17 NOTE — ED Notes (Signed)
Pt assisted to toilet to void 

## 2019-01-17 NOTE — ED Notes (Addendum)
Pt states she has been having vaginal itching/ burning for several weeks, has been treated for this issue twice and is currently taking medication to treat a yeast infection.  Pt also states she has a burning pain in her right groin. Pt noted to have chronic leg pain.  Pt has right sided non-pitting edema to lower extremity.  Pt states she fell July 18th 2020.

## 2019-01-17 NOTE — ED Notes (Signed)
Notified Scientist, water quality that pt could not find transport back to [facility] and [facility] has phone issues and we are forced to email them as our only form of communication

## 2019-01-17 NOTE — ED Notes (Signed)
Pt taken to bathroom.

## 2019-01-17 NOTE — ED Notes (Signed)
Pt attempted to call granddaughter to pick her up with no answer

## 2019-01-17 NOTE — ED Notes (Signed)
Attempted to call report to [facility]. Informed phones are down but that an email would be sent to executive director with "high importance".

## 2019-01-17 NOTE — ED Notes (Signed)
Patient transported to CT 

## 2019-01-17 NOTE — ED Notes (Addendum)
Melody from [facility] called and got report for this pt - they are sending transportation to pick her up - pt notified

## 2019-01-17 NOTE — Discharge Instructions (Addendum)
We are giving a short course of antibiotics for possible cellulitis on your right lower leg.  We also gave you a one-time dose of fluconazole to take after the antibiotics are completed to prevent a yeast infection from happening.  Return to the ER for fevers, worsening redness or any other concerns. Your CT scan is as below.   IMPRESSION: 1. No acute abnormality of the abdomen or pelvis. 2. Subacute vertical fracture of the left sacral ala. 3. Subacute fracture of the right pubic body. 4. New Schmorl's node in the superior endplate of L3. 5. Slight enlargement of the cystic lesion in the body of the pancreas. 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

## 2019-01-17 NOTE — ED Notes (Signed)
Attempted to call report to [facility] x2 without success - they requested that we call back in 20 min- charge RN is aware d/t pt does not meet med necc. criteria

## 2019-01-17 NOTE — ED Provider Notes (Signed)
[facility] Emergency Department Provider Note  ____________________________________________   First MD Initiated Contact with Patient 01/17/19 [phone]     (approximate)  I have reviewed the triage vital signs and the nursing notes.   HISTORY  Chief Complaint Leg Pain    HPI Timara Loma is a 85 y.o. female with atrial flutter on Eliquis, CHF, hypertension who presents with leg pain.  Patient was seen on 8/24 for urinary symptoms and also noted to have pain in her right thigh.  Patient was noted to have 2+ distal pulse but given some mild edema patient got a ultrasound that was negative.  Patient versus that her urinary symptoms have since cleared up but she continues to have right inner thigh pain.  Patient expresses having a fall back in July.  Is unclear how long this pain has been going on and orbits in relation to her vaginal symptoms.  She is not the best historian.  She is denying urinary symptoms at this time however.  She has had a prior hip replacement on the right side.  No fevers.       Past Medical History:  Diagnosis Date   Arthritis    Atrial flutter ([place])    a. Dx 07/2018. CHA2DS2VASc = 5-->Eliquis.   Bladder incontinence    Breast cancer ([place]) 8185   Chronic systolic CHF (congestive heart failure) ([place]) 01/10/2016   a. 09/2015 Echo: EF 40-45%, possible inf HK, mod PAH (PASP 73mHg).   Closed fracture of right femur ([place]    a. 08/2017->Managed conservatively.   Depression    GERD (gastroesophageal reflux disease)    History of kidney stones    HLD (hyperlipidemia)    Hypertension    Neuropathy of both feet    NICM (nonischemic cardiomyopathy) ([place]    a. 08/2015 Lexiscan MV: EF 39%, no ischemia; b. 09/2015 Echo: EF 40-45%.   Right Humerus head fracture    a. 07/2016 - managed conservatively.   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    Patient Active Problem List   Diagnosis Date Noted   Femur fracture ([place] 09/09/2017    Abnormal gait 12/19/2016   Acquired spondylolisthesis 12/19/2016   Breast lump 12/19/2016   Cervical spondylosis without myelopathy 12/19/2016   Closed Colles' fracture 12/19/2016   Closed fracture of base of neck of femur ([place] 12/19/2016   Closed fracture of lower end of radius and ulna 12/19/2016   Closed fracture of neck of femur ([place] 12/19/2016   Spondylolisthesis, congenital 12/19/2016   Contracture of joint of hand 12/19/2016   Contracture of wrist joint 12/19/2016   Contusion of hand 12/19/2016   Contusion of hip 12/19/2016   Contusion of knee 12/19/2016   Degeneration of intervertebral disc at C4-C5 level 12/19/2016   Degeneration of lumbar intervertebral disc 12/19/2016   Disorder of coccyx 12/19/2016   Dry eyes 12/19/2016   Enthesopathy 12/19/2016   Enthesopathy of hip region 12/19/2016   Hand joint pain 12/19/2016   Hand joint stiff 12/19/2016   Hip pain 12/19/2016   Joint pain 12/19/2016   Knee pain 12/19/2016   Localized, primary osteoarthritis 12/19/2016   Loose body in hip joint 12/19/2016   Lumbar sprain 12/19/2016   Abnormal mammogram 12/19/2016   Meibomian gland dysfunction 12/19/2016   Muscle weakness 12/19/2016   Open fracture of lower end of forearm 12/19/2016   Presbyopia 12/19/2016   Regular astigmatism 12/19/2016   Shoulder joint pain 12/19/2016   Skin sensation disturbance 12/19/2016  Sprain of ankle 12/19/2016   Trochanteric bursitis 12/19/2016   Wrist joint pain 12/19/2016   Stiffness of wrist joint 12/19/2016   Acute bilateral low back pain without sciatica 09/29/2016   Acute bronchitis 06/19/2016   Recurrent productive cough 06/14/2016   Medicare annual wellness visit, subsequent 05/17/2016   Fall in elderly patient 01/27/2016   Sprain of wrist, right 08/81/1031   Systolic CHF with reduced left ventricular function, NYHA class 3 ([place]) 01/10/2016   Productive cough 11/19/2015   Chronic  systolic heart failure ([place]) 10/12/2015   Dyspnea on exertion 08/31/2015   Fatigue 08/31/2015   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   Irregular heart rhythm 07/22/2015   Left arm pain 06/29/2015   Dyslipidemia 06/03/2015   Acute recurrent maxillary sinusitis 06/03/2015   Oral mucosal lesion 05/26/2015   Urge incontinence 02/14/2015   Ankle edema 02/02/2015   At risk for falling 01/25/2015   Dizziness 01/25/2015   Acid reflux 01/25/2015   Big thyroid 01/25/2015   Gravida 2 para 2 01/25/2015   Personal history of malignant neoplasm of breast 01/25/2015   Arthritis, degenerative 01/25/2015   Parity 2 01/25/2015   Peripheral blood vessel disorder ([place]) 01/25/2015   Need for vaccination 01/25/2015   Pain in rectum 01/25/2015   Reflux 01/25/2015   Screening for depression 01/25/2015   Disease of accessory sinus 01/25/2015   Headache, temporal 01/25/2015   Primary cancer of thymus ([place]) 01/25/2015   Disease of thyroid gland 01/25/2015   Avitaminosis D 01/25/2015   Family history of diabetes mellitus in father 12/31/2014   Fasting hyperglycemia 12/31/2014   Hypertension 12/01/2014   HLD (hyperlipidemia) 12/01/2014   Anxiety 12/01/2014   Myofascial pain 12/01/2014   Breast CA ([place]) 11/24/2014   Absence of bladder continence 11/24/2014   Urgency of micturation 11/24/2014   Common bile duct dilatation 10/20/2014   Pancreatic cyst 10/20/2014   Kidney stones 10/20/2014   Neuritis or radiculitis due to rupture of lumbar intervertebral disc 06/15/2014   Lumbar canal stenosis 06/15/2014   Degenerative arthritis of lumbar spine 06/15/2014   Lumbar radiculitis 06/15/2014   Osteoarthritis of spine with radiculopathy, lumbar region 06/15/2014   Carrier of infectious disease 07/21/2013   Bloodgood disease 11/05/2012   Arthritis of temporomandibular joint 06/24/2012   Atypical chest pain 01/05/2012   Breath shortness 12/04/2011   Lung mass  11/15/2011   Thyroid nodule 11/15/2011   Age-related macular degeneration, dry 10/03/2011   Disorder of peripheral nervous system 10/03/2011   Chronic rhinitis 03/03/2011   Carotid artery narrowing 03/01/2011   Polypharmacy 12/02/2010    Past Surgical History:  Procedure Laterality Date   ABDOMINAL HYSTERECTOMY     BREAST LUMPECTOMY     CATARACT EXTRACTION     CHOLECYSTECTOMY     JOINT REPLACEMENT Right    Hip   KNEE SURGERY Right     Prior to Admission medications   Medication Sig Start Date End Date Taking? Authorizing Provider  acetaminophen (TYLENOL) 500 MG tablet Take 500 mg by mouth every 4 (four) hours as needed for mild pain or fever.    [provider]  acetaminophen (TYLENOL) 650 MG CR tablet Take 650 mg by mouth 3 (three) times daily.    [provider]  alum & mag hydroxide-simeth (MINTOX) 200-200-20 MG/5ML suspension Take 30 mLs by mouth at bedtime as needed for indigestion or heartburn.    [provider]  apixaban (ELIQUIS) 5 MG TABS tablet Take 1 tablet (5 mg total) by mouth 2 (  two) times daily. 07/19/18   Theora Gianotti, NP  carbamide peroxide (GLY-OXIDE) 10 % solution Place 1 application onto teeth 4 (four) times daily -  with meals and at bedtime. (as needed for dry mouth)    [provider]  carvedilol (COREG) 12.5 MG tablet Take 1.5 tablets (18.75 mg total) by mouth 2 (two) times daily. 02/22/18 09/19/27  Theora Gianotti, NP  Cholecalciferol 2000 UNITS TABS Take 2,000 Units by mouth daily.     Roselee Nova, MD  Cyanocobalamin (VITAMIN DEFICIENCY SYSTEM-B12) 1000 MCG/ML KIT Inject 1,000 mcg as directed every 30 (thirty) days.     [provider]  diclofenac sodium (VOLTAREN) 1 % GEL Apply 2 g topically 2 (two) times daily as needed (knee pain).    [provider]  docusate sodium (COLACE) 100 MG capsule Take 100 mg by mouth at bedtime.     [provider]  fluticasone  (FLONASE) 50 MCG/ACT nasal spray Place 1 spray into both nostrils daily. 08/30/17   Wilhelmina Mcardle, MD  Fluticasone Furoate (ARNUITY ELLIPTA) 100 MCG/ACT AEPB Inhale 1 puff into the lungs daily. 08/30/17   Wilhelmina Mcardle, MD  furosemide (LASIX) 20 MG tablet Take 20 mg by mouth 2 (two) times daily.     [provider]  guaiFENesin (MUCINEX) 600 MG 12 hr tablet Take 600 mg by mouth 2 (two) times daily as needed for cough or to loosen phlegm.     [provider]  guaiFENesin (ROBITUSSIN) 100 MG/5ML SOLN Take 10 mLs by mouth every 6 (six) hours as needed for cough or to loosen phlegm.    [provider]  lidocaine (LIDODERM) 5 % Place 1 patch onto the skin daily. Remove & Discard patch within 12 hours or as directed by MD    [provider]  Lidocaine 4 % PTCH Apply 1 patch topically at bedtime.    [provider]  loperamide (IMODIUM A-D) 2 MG tablet Take 2 mg by mouth as needed for diarrhea or loose stools (max 4 doses daily).     [provider]  LORazepam (ATIVAN) 0.5 MG tablet Take 1 tablet (0.5 mg total) by mouth 2 (two) times daily as needed for anxiety. Patient taking differently: Take 0.5 mg by mouth 2 (two) times daily.  05/23/17   Roselee Nova, MD  losartan (COZAAR) 100 MG tablet Take 1 tablet (100 mg total) by mouth daily. 05/23/17   Roselee Nova, MD  lovastatin (MEVACOR) 40 MG tablet Take 1 tablet (40 mg total) by mouth daily. Patient taking differently: Take 40 mg by mouth every Monday, Wednesday, and Friday.  05/23/17   Roselee Nova, MD  Lutein 10 MG TABS Take 10 mg by mouth 2 (two) times daily.    [provider]  magnesium hydroxide (MILK OF MAGNESIA) 400 MG/5ML suspension Take 30 mLs by mouth at bedtime as needed for mild constipation.    [provider]  mirabegron ER (MYRBETRIQ) 25 MG TB24 tablet Take 25 mg by mouth every evening.    [provider]  neomycin-bacitracin-polymyxin (NEOSPORIN)  [phone] ointment Apply topically 4 (four) times daily as needed (wound care).    [provider]  polyethylene glycol (MIRALAX / GLYCOLAX) 17 g packet Take 17 g by mouth daily.     [provider]  Saccharomyces boulardii (PROBIOTIC) 250 MG CAPS Take 1 capsule by mouth daily.     [provider]  traMADol (  ULTRAM) 50 MG tablet Take 50 mg by mouth daily.     [provider]  traMADol (ULTRAM) 50 MG tablet Take 50 mg by mouth every 8 (eight) hours as needed for moderate pain.    [provider]  trimethoprim (TRIMPEX) 100 MG tablet Take 1 tablet (100 mg total) by mouth daily. 03/04/18   Bjorn Loser, MD    Allergies 2,4-d dimethylamine (amisol); Celebrex [celecoxib]; Ciprofloxacin; Ibuprofen; Levamisole; Metronidazole; Naproxen; and Other  Family History  Problem Relation Age of Onset   Stroke Mother    Diabetes Father    Heart disease Father    Kidney disease Neg Hx    Bladder Cancer Neg Hx    Kidney cancer Neg Hx     Social History Social History   Tobacco Use   Smoking status: Former Smoker   Smokeless tobacco: Never Used   Tobacco comment: smoked in high school for a few years only  Substance Use Topics   Alcohol use: No    Alcohol/week: 0.0 standard drinks   Drug use: No      Review of Systems Constitutional: No fever/chills Eyes: No visual changes. ENT: No sore throat. Cardiovascular: Denies chest pain. Respiratory: Denies shortness of breath. Gastrointestinal: No abdominal pain.  No nausea, no vomiting.  No diarrhea.  No constipation. Genitourinary: Negative for dysuria. Musculoskeletal: Negative for back pain.  Right groin pain Skin: Negative for rash. Neurological: Negative for headaches, focal weakness or numbness. All other ROS negative ____________________________________________   PHYSICAL EXAM:  VITAL SIGNS: ED Triage Vitals  Enc Vitals Group     BP 01/17/19 0917 (!) 170/70     Pulse  Rate 01/17/19 0917 73     Resp 01/17/19 0917 17     Temp 01/17/19 0917 98.7 F (37.1 C)     Temp src --      SpO2 01/17/19 0917 97 %     Weight 01/17/19 0920 157 lb (71.2 kg)     Height 01/17/19 0920 '5\' 6"'  (1.676 m)     Head Circumference --      Peak Flow --      Pain Score 01/17/19 0918 10     Pain Loc --      Pain Edu? --      Excl. in [place]? --     Constitutional: Alert and oriented. Well appearing and in no acute distress. Eyes: Conjunctivae are normal. EOMI. Head: Atraumatic. Nose: No congestion/rhinnorhea. Mouth/Throat: Mucous membranes are moist.   Neck: No stridor. Trachea Midline. FROM Cardiovascular: Normal rate, regular rhythm. Grossly normal heart sounds.  Good peripheral circulation. Respiratory: Normal respiratory effort.  No retractions. Lungs CTAB. Gastrointestinal: Soft and nontender. No distention. No abdominal bruits.  Musculoskeletal: Tenderness in her right lower groin.  No erythema or warmth over the joint.  Does have some mild erythema and warmth over the lower tib/fib. No open wounds.  Good distal pulses.  Neurologic:  Normal speech and language. No gross focal neurologic deficits are appreciated.  Skin:  Skin is warm, dry and intact. No rash noted. Psychiatric: Mood and affect are normal. Speech and behavior are normal. GU: Deferred   ____________________________________________   LABS (all labs ordered are listed, but only abnormal results are displayed)  Labs Reviewed  WET PREP, GENITAL - Abnormal; Notable for the following components:      Result Value   WBC, Wet Prep HPF POC FEW (*)    All other components within normal limits  COMPREHENSIVE METABOLIC PANEL -  Abnormal; Notable for the following components:   Glucose, Bld 124 (*)    Total Protein 6.0 (*)    All other components within normal limits  URINALYSIS, ROUTINE W REFLEX MICROSCOPIC - Abnormal; Notable for the following components:   Color, Urine YELLOW (*)    APPearance CLEAR (*)     All other components within normal limits  CBC WITH DIFFERENTIAL/PLATELET  CK   ____________________________________________  RADIOLOGY   Official radiology report(s): Ct Abdomen Pelvis Wo Contrast  Result Date: 01/17/2019 CLINICAL DATA:  Right-sided lower abdominal and pelvic pain and right hip pain. History of kidney stones. EXAM: CT ABDOMEN AND PELVIS WITHOUT CONTRAST TECHNIQUE: Multidetector CT imaging of the abdomen and pelvis was performed following the standard protocol without IV contrast. COMPARISON:  CT scan of the abdomen dated 10/15/2014 FINDINGS: Lower chest: No acute abnormality. Chronic irregular parenchymal density at the right lung base with adjacent surgical staples, unchanged since the prior study. New linear scarring or atelectasis at the left lung base posterior medially. Aortic atherosclerosis. New mild cardiomegaly since the prior study. Hepatobiliary: No focal liver abnormality is seen. Status post cholecystectomy. No biliary dilatation. Pancreas: There is a slightly lobulated cystic lesion in the body of the pancreas measuring 24 x 17 mm. This measured 22 x 15 mm on the prior study. Pancreas is otherwise normal. Spleen: Normal in size without focal abnormality. Adrenals/Urinary Tract: Adrenal glands are normal. Small calcifications in the upper poles of both kidneys. Renal parenchyma is normal. No hydronephrosis. Bladder is partially obscured by metallic artifact from the right hip prosthesis but there is no discrete abnormality of bladder. Stomach/Bowel: Stomach is within normal limits. Appendix is not visualized. No evidence of bowel wall thickening, distention, or inflammatory changes. Vascular/Lymphatic: Aortic atherosclerosis. No enlarged abdominal or pelvic lymph nodes. Reproductive: Status post hysterectomy. No adnexal masses. Other: Laxity and atrophy in the left abdominal wall without hernia. This is unchanged. Surgical mash in anterior abdominal wall midline, unchanged.  Musculoskeletal: There is subacute vertical fracture of the left sacral ala. There is also a subacute fracture of the right pubic body with incomplete healing at each fracture site. There is an old healed fracture of L1 vertebral body. There is a new Schmorl's node in the superior endplate of L3. Chronic grade 1 spondylolisthesis at L4-5. Chronic degenerative facet arthritis in the lower lumbar spine. Old compression fracture of T10. IMPRESSION: 1. No acute abnormality of the abdomen or pelvis. 2. Subacute vertical fracture of the left sacral ala. 3. Subacute fracture of the right pubic body. 4. New Schmorl's node in the superior endplate of L3. 5. Slight enlargement of the cystic lesion in the body of the pancreas. 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 Aortic Atherosclerosis (ICD10-I70.0). Electronically Signed   By: Lorriane Shire M.D.   On: 01/17/2019 11:10    ____________________________________________   PROCEDURES  Procedure(s) performed (including Critical Care):  Procedures   ____________________________________________   INITIAL IMPRESSION / ASSESSMENT AND PLAN / ED COURSE  Suzzane Quilter was evaluated in Emergency Department on 01/17/2019 for the symptoms described in the history of present illness. She was evaluated in the context of the global COVID-19 pandemic, which necessitated consideration that the patient might be at risk for infection with the SARS-CoV-2 virus that causes COVID-19. Institutional protocols and algorithms that pertain to the evaluation of patients at risk for COVID-19 are in a state of rapid change based on information released by regulatory bodies including the CDC and federal and  state organizations. These policies and algorithms were followed during the patient's care in the ED.    Patient is a well-appearing 85 year old who presents with right groin pain.  Patient was seen about 1 week ago and had similar right thigh pain  at that time.  Patient is had a DVT ultrasound that was negative.  Patient is on blood thinner therefore I have low suspicion that this is a blood clot missed by the ultrasound.  Patient has good distal pulses so no evidence of ischemia.  No evidence of septic joint upon examination.  Will get CK to evaluate for rhabdo but lower suspicion.  Will get CT scan to evaluate for occult fracture although no recent falls from report of patient although not the best historian. Also evaluate for kidney stone that could be causing some referred pain given for prior history of urinary symptoms.  Does have some mild erythema and warmth on the right lower leg, possible cellulitis vs Chronic venosis stasis.  Low suspicion for nec fasc.  No evidence of sepsis.   CK is negative  UA without evidence of UTI   CT scan shows subacute fractures with patient endorses knowing about previously.  No yeast found in the wet prep.   We will give patient a course of Keflex and give her 1 dose of fluconazole to take after the Keflex.  Patient is otherwise remained well-appearing with normal vital signs except for some hypertension.  Patient instructed follow-up with her PCP in 2 days and to return the ER for fevers or worsening redness.  Patient given a copy of the CT report. _____________________________________   FINAL CLINICAL IMPRESSION(S) / ED DIAGNOSES   Final diagnoses:  Cellulitis of right lower extremity  Groin pain, right      MEDICATIONS GIVEN DURING THIS VISIT:  Medications - No data to display   ED Discharge Orders         Ordered    fluconazole (DIFLUCAN) 150 MG tablet  Daily     01/17/19 1158    cephALEXin (KEFLEX) 250 MG capsule  4 times daily     01/17/19 1158           Note:  This document was prepared using Dragon voice recognition software and may include unintentional dictation errors.   Vanessa Upsala, MD 01/17/19 [phone]

## 2019-01-17 NOTE — Progress Notes (Signed)
   01/17/19 1200  Clinical Encounter Type  Visited With Patient  Visit Type Initial  Stress Factors  Patient Stress Factors Lack of knowledge   Chaplain talked with the patient during rounding. Upon arrival, the patient was laying on a bed in the hallway. She appeared to be tearful and was dabbing her eyes/nose with a tissue. The patient reported that she was waiting for information to be given/received from her nurse, but did not elaborate any further. The patient reported that she is currently a resident at [facility]. The patient also reported an urgent need to use the bathroom; this chaplain conveyed this need to the patient's nurse. No additional needs at this time.

## 2019-01-17 NOTE — ED Notes (Signed)
Pt daughter contacted and she is unable to transport pt back to [facility] - she stated that it would take her 45 min to get her and she was not "willing to do that"

## 2019-01-17 NOTE — ED Notes (Signed)
Pt extremely agitated that she is not being taken back to nursing home - she was informed that her daughter could not come and get her and that we were trying to reach the nursing home to call report

## 2019-01-28 ENCOUNTER — Other Ambulatory Visit: Payer: Self-pay | Admitting: Cardiovascular Disease

## 2019-01-30 ENCOUNTER — Telehealth: Payer: Self-pay | Admitting: Cardiovascular Disease

## 2019-01-30 NOTE — Telephone Encounter (Signed)
Pt c/o Shortness Of Breath: STAT if SOB developed within the last 24 hours or pt is noticeably SOB on the phone  1. Are you currently SOB (can you hear that pt is SOB on the phone)? somewhat  2. How long have you been experiencing SOB? 2-3 weeks  3. Are you SOB when sitting or when up moving around? Up moving around, just getting dressed, or walking down the hall  4. Are you currently experiencing any other symptoms? Some flutters sometimes. States she was told at the ED to increase her Lasix .

## 2019-01-30 NOTE — Telephone Encounter (Signed)
Patienet accompanied her husband to his in office appointment today. While checking out the front desk noticed that the patient was sob and notified the RN.  Spoke with the patient. She is able to communicate in complete sentences without stopping to catch her breath. Patient sts that she has been having increased sob with ambulation for several weeks. She denies orthopnea, PND and is able to rest the whole night in bed She can walk about 50-475 yards and will then need to stop and rest. She does ambulate with a walker with wheels. Her weights have been stable. She denies weight gain. Her Lasix was incerased for a few days after her 01/17/19 ED visit. She is back down to taking lasix 20mg  daily. The patients O2 at rest on room air 96. HR 68bpm. She does have chronic LE edema +1 today. She sts that she is unable to tolerate compression stockings. She is scheduled to see Christell Faith, PA on 02/13/19. I asked the front desk to schedule a sooner appt. Patient adv to contact the office sooner if symptoms worsen.

## 2019-02-03 NOTE — Telephone Encounter (Signed)
Patient had to cancel asap appt tomorrow due to transportation issues .  Patient wants to make sure it is ok for her to wait until 10/1 scheduled appt

## 2019-02-03 NOTE — Telephone Encounter (Signed)
Returned the patient's call. Lmom. It is ok to keep her 10/1 appt. Patient is to contact the office in the interim if symptoms worsen.

## 2019-02-04 ENCOUNTER — Ambulatory Visit: Payer: Medicare Other | Admitting: Cardiovascular Disease

## 2019-02-05 ENCOUNTER — Encounter: Payer: Self-pay | Admitting: Nurse Practitioner

## 2019-02-05 ENCOUNTER — Ambulatory Visit (INDEPENDENT_AMBULATORY_CARE_PROVIDER_SITE_OTHER): Payer: Medicare Other | Admitting: Nurse Practitioner

## 2019-02-05 ENCOUNTER — Other Ambulatory Visit: Payer: Self-pay

## 2019-02-05 VITALS — BP 134/82 | HR 71 | Ht 66.0 in | Wt 160.0 lb

## 2019-02-05 DIAGNOSIS — I5043 Acute on chronic combined systolic (congestive) and diastolic (congestive) heart failure: Secondary | ICD-10-CM | POA: Diagnosis not present

## 2019-02-05 DIAGNOSIS — I4819 Other persistent atrial fibrillation: Secondary | ICD-10-CM | POA: Diagnosis not present

## 2019-02-05 DIAGNOSIS — I4892 Unspecified atrial flutter: Secondary | ICD-10-CM | POA: Diagnosis not present

## 2019-02-05 DIAGNOSIS — I10 Essential (primary) hypertension: Secondary | ICD-10-CM | POA: Diagnosis not present

## 2019-02-05 MED ORDER — FUROSEMIDE 20 MG PO TABS: ORAL_TABLET | ORAL | 0 refills | Status: DC

## 2019-02-05 NOTE — Patient Instructions (Signed)
Medication Instructions:   Your physician has recommended you make the following change in your medication: 1. INCREASE Lasix 20MG  Take two tablets by mouth daily.  If you need a refill on your cardiac medications before your next appointment, please call your pharmacy.   Lab work:  1. None Ordered  If you have labs (blood work) drawn today and your tests are completely normal, you will receive your results only by: Marland Kitchen MyChart Message (if you have MyChart) OR . A paper copy in the mail If you have any lab test that is abnormal or we need to change your treatment, we will call you to review the results.  Testing/Procedures:  1. None Ordered  Follow-Up: At [facility], you and your health needs are our priority.  As part of our continuing mission to provide you with exceptional heart care, we have created designated Provider Care Teams.  These Care Teams include your primary Cardiologist (physician) and Advanced Practice Providers (APPs -  Physician Assistants and Nurse Practitioners) who all work together to provide you with the care you need, when you need it. You will need a follow up with Murray Hodgkins, NP on 02/12/2019.

## 2019-02-05 NOTE — Progress Notes (Signed)
Office Visit    Patient Name: Jodi Bullock Date of Encounter: 02/05/2019  Primary Care Provider:  [facility] Primary Cardiologist:  Kathlyn Sacramento, MD  Chief Complaint    85 year old female with a history of nonischemic cardiomyopathy, chronic combined systolic and diastolic congestive heart failure, hypertension, hyperlipidemia, thymus cancer, right breast cancer, and A. fib/flutter, who presents for follow-up secondary to lower extremity swelling and dyspnea.  Past Medical History    Past Medical History:  Diagnosis Date   Arthritis    Atrial flutter ([place])    a. Dx 07/2018. CHA2DS2VASc = 5-->Eliquis.   Bladder incontinence    Breast cancer ([place]) 0865   Chronic systolic CHF (congestive heart failure) ([place]) 01/10/2016   a. 09/2015 Echo: EF 40-45%, possible inf HK, mod PAH (PASP 97mHg).   [place] of right femur [place]    a. 08/2017->Managed conservatively.   Depression    GERD (gastroesophageal reflux disease)    History of kidney stones    HLD (hyperlipidemia)    Hypertension    Neuropathy of both feet    NICM ([place]ischemic cardiomyopathy) ([place]    a. 08/2015 Lexiscan MV: EF 39%, no ischemia; b. 09/2015 Echo: EF 40-45%.   Right Humerus head fracture    a. 07/2016 - managed conservatively.   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[place]    cancer   Past Surgical History:  Procedure Laterality Date   ABDOMINAL HYSTERECTOMY     BREAST LUMPECTOMY     CATARACT EXTRACTION     CHOLECYSTECTOMY     JOINT REPLACEMENT Right    Hip   KNEE SURGERY Right     Allergies  Allergies  Allergen Reactions   2,4-D Dimethylamine (Amisol) Other (See Comments)    Other Reaction: OTHER REACTION-IRRITATION TO Esophagous   Celebrex [Celecoxib] Other (See Comments)     esophagitis Esophageal spasms     Ciprofloxacin Other (See Comments)    Esophageal irritation   Ibuprofen     Other reaction(s): Other (See Comments) reflux   Levamisole Other (See Comments) and  Nausea And Vomiting   Metronidazole Other (See Comments)    neuropathy   Naproxen Other (See Comments)    GI UPSET   Other Other (See Comments)    Other Reaction: esophagitis    History of Present Illness    85year old female with the above complex past medical history including hypertension, persistent a flutter/fib, hyperlipidemia, GERD, nonischemic cardiomyopathy, chronic combined heart failure, thymus cancer, and remote breast cancer.  Cardiac history dates back to April 2017, when she was evaluated with exertional dyspnea.  Stress testing showed an EF of 39% without ischemia.  Follow-up echocardiography showed an EF of 40 to 45% with possible inferior hypokinesis.  Medical therapy was initiated including beta-blocker and ARB.  She was initially taking Lasix as needed but due to increasing lower extremity swelling, she required daily Lasix since March of this year.  She is currently on 20 mg daily.  She was diagnosed with atrial flutter in March.  This was rate controlled and she was asymptomatic.  She was placed on Eliquis therapy.  She was doing well at telemedicine visit in May and also at office visit in July.  In late July/early August, she was treated for bronchitis with Augmentin and a prednisone taper.  Following this, she developed a yeast infection and was seen by urology and placed on Diflucan.  Symptoms initially resolved but returned in late August, prompting ER evaluation.  She was again treated with a  dose of Diflucan.  She also complained of right leg pain at that visit and an ultrasound was negative for DVT.  She returned to the ED on September 4 due to ongoing right leg pain.  A CT of the abdomen was performed and showed subacute vertical fracture of the left sacral ala and subacute fracture of the right pubic body.  She had recalled slipping in her shower in July and striking her pelvis against the wall.  In the ED, she was diagnosed with right lower extremity cellulitis and  given a prescription for Keflex.  She was also given a dose of fluconazole to take after she completed the course of Keflex.  She notes that beginning prior to her ER evaluation, perhaps as far back as early August, she has been noticing increasing dyspnea on exertion associated with right greater than left lower extremity edema.  She thinks that perhaps this began at the time of the prednisone taper.  She can walk about 50 feet now before feeling dyspneic and needing to take a rest.  She does not experience chest pain and denies palpitations.  She continues to have right greater than left lower extremity swelling and has been compliant with her Lasix 20 mg daily.  She is careful with her salt intake.  She denies palpitations, PND, orthopnea, dizziness, syncope, or early satiety.  She has had some constipation which is been bothersome.  Home Medications    Prior to Admission medications   Medication Sig Start Date End Date Taking? Authorizing Provider  acetaminophen (TYLENOL) 500 MG tablet Take 500 mg by mouth every 4 (four) hours as needed for mild pain or fever.   Yes [provider]  acetaminophen (TYLENOL) 650 MG CR tablet Take 650 mg by mouth 3 (three) times daily.   Yes [provider]  alum & mag hydroxide-simeth (MINTOX) 200-200-20 MG/5ML suspension Take 30 mLs by mouth at bedtime as needed for indigestion or heartburn.   Yes [provider]  apixaban (ELIQUIS) 5 MG TABS tablet Take 1 tablet (5 mg total) by mouth 2 (two) times daily. 07/19/18  Yes Theora Gianotti, NP  carbamide peroxide (GLY-OXIDE) 10 % solution Place 1 application onto teeth 4 (four) times daily -  with meals and at bedtime. (as needed for dry mouth)   Yes [provider]  carvedilol (COREG) 12.5 MG tablet Take 1.5 tablets (18.75 mg total) by mouth 2 (two) times daily. 02/22/18 09/19/27 Yes Theora Gianotti, NP  Cholecalciferol 2000 UNITS TABS Take 2,000 Units by mouth daily.     Yes Roselee Nova, MD  Cyanocobalamin (VITAMIN DEFICIENCY SYSTEM-B12) 1000 MCG/ML KIT Inject 1,000 mcg as directed every 30 (thirty) days.    Yes [provider]  diclofenac sodium (VOLTAREN) 1 % GEL Apply 2 g topically 2 (two) times daily as needed (knee pain).   Yes [provider]  docusate sodium (COLACE) 100 MG capsule Take 100 mg by mouth at bedtime.    Yes [provider]  fluticasone (FLONASE) 50 MCG/ACT nasal spray Place 1 spray into both nostrils daily. 08/30/17  Yes Wilhelmina Mcardle, MD  Fluticasone Furoate (ARNUITY ELLIPTA) 100 MCG/ACT AEPB Inhale 1 puff into the lungs daily. 08/30/17  Yes Wilhelmina Mcardle, MD  furosemide (LASIX) 20 MG tablet TAKE 1 TABLET BY MOUTH DAILY 01/28/19  Yes Cleaver, Jossie Ng, NP  guaiFENesin (MUCINEX) 600 MG 12 hr tablet Take 600 mg by mouth 2 (two) times daily as needed for cough  or to loosen phlegm.    Yes [provider]  guaiFENesin (ROBITUSSIN) 100 MG/5ML SOLN Take 10 mLs by mouth every 6 (six) hours as needed for cough or to loosen phlegm.   Yes [provider]  lidocaine (LIDODERM) 5 % Place 1 patch onto the skin daily. Remove & Discard patch within 12 hours or as directed by MD   Yes [provider]  Lidocaine 4 % PTCH Apply 1 patch topically at bedtime.   Yes [provider]  loperamide (IMODIUM A-D) 2 MG tablet Take 2 mg by mouth as needed for diarrhea or loose stools (max 4 doses daily).    Yes [provider]  LORazepam (ATIVAN) 0.5 MG tablet Take 1 tablet (0.5 mg total) by mouth 2 (two) times daily as needed for anxiety. Patient taking differently: Take 0.5 mg by mouth 2 (two) times daily.  05/23/17  Yes Roselee Nova, MD  losartan (COZAAR) 100 MG tablet Take 1 tablet (100 mg total) by mouth daily. 05/23/17  Yes Roselee Nova, MD  lovastatin (MEVACOR) 40 MG tablet Take 1 tablet (40 mg total) by mouth daily. Patient taking differently: Take 40 mg by mouth every Monday,  Wednesday, and Friday.  05/23/17  Yes Roselee Nova, MD  Lutein 10 MG TABS Take 10 mg by mouth 2 (two) times daily.   Yes [provider]  magnesium hydroxide (MILK OF MAGNESIA) 400 MG/5ML suspension Take 30 mLs by mouth at bedtime as needed for mild constipation.   Yes [provider]  mirabegron ER (MYRBETRIQ) 25 MG TB24 tablet Take 25 mg by mouth every evening.   Yes [provider]  neomycin-bacitracin-polymyxin (NEOSPORIN) [phone] ointment Apply topically 4 (four) times daily as needed (wound care).   Yes [provider]  polyethylene glycol (MIRALAX / GLYCOLAX) 17 g packet Take 17 g by mouth daily.    Yes [provider]  Saccharomyces boulardii (PROBIOTIC) 250 MG CAPS Take 1 capsule by mouth daily.    Yes [provider]  traMADol (ULTRAM) 50 MG tablet Take 50 mg by mouth daily.    Yes [provider]  traMADol (ULTRAM) 50 MG tablet Take 50 mg by mouth every 8 (eight) hours as needed for moderate pain.   Yes [provider]  trimethoprim (TRIMPEX) 100 MG tablet Take 1 tablet (100 mg total) by mouth daily. 03/04/18  Yes MacDiarmid, Nicki Reaper, MD    Review of Systems    Ongoing lower extremity swelling over 6-week period, along with dyspnea on exertion as outlined above.  She has had intermittent vaginal itching and also dysuria as outlined in the HPI.  She continues to have mild right leg pain in the setting of subacute pelvic fractures noted on CT earlier this month.  She denies chest pain, palpitations, PND, orthopnea, dizziness, syncope, or early satiety.  She does have constipation which is bothersome.  All other systems reviewed and are otherwise negative except as noted above.  Physical Exam    VS:  BP 134/82 (BP Location: Left Arm, Patient Position: Sitting, Cuff Size: Normal)    Pulse 71    Ht '5\' 6"'  (1.676 m)    Wt 160 lb (72.6 kg)    SpO2 97%    BMI 25.82 kg/m  , BMI Body mass index is 25.82 kg/m. GEN: Well  nourished, well developed, in no acute distress. HEENT: normal. Neck: Supple, no JVD, carotid bruits, or masses. Cardiac: Irregularly irregular, no murmurs, rubs,  or gallops. No clubbing, cyanosis, 2+ bilateral lower extremity edema.  Right slightly worse than left.  Radials/PT 1+ and equal bilaterally.  Respiratory:  Respirations regular and unlabored, clear to auscultation bilaterally. GI: Soft, nontender, nondistended, BS + x 4. MS: no deformity or atrophy. Skin: warm and dry, no rash. Neuro:  Strength and sensation are intact. Psych: Normal affect.  Accessory Clinical Findings    ECG personally reviewed by me today -atrial fibrillation, 73- no acute changes.  Lab Results  Component Value Date   WBC 6.8 01/17/2019   HGB 12.7 01/17/2019   HCT 40.9 01/17/2019   MCV 95.6 01/17/2019   PLT 189 01/17/2019   Lab Results  Component Value Date   CREATININE 0.73 01/17/2019   BUN 19 01/17/2019   NA 140 01/17/2019   K 4.3 01/17/2019   CL 103 01/17/2019   CO2 28 01/17/2019   Lab Results  Component Value Date   ALT 14 01/17/2019   AST 18 01/17/2019   ALKPHOS 103 01/17/2019   BILITOT 0.3 01/17/2019   Lab Results  Component Value Date   CHOL 139 05/24/2017   HDL 48 (L) 05/24/2017   LDLCALC 67 05/24/2017   TRIG 163 (H) 05/24/2017   CHOLHDL 2.9 05/24/2017     Assessment & Plan    1.  Acute on chronic combined systolic and diastolic congestive heart failure: Over the past 6 weeks, patient has been noticing increasing dyspnea on exertion and lower extremity edema (right greater than left).  Her weight is up 5 pounds over her usual baseline of about 155 pounds.  She does have significant bilateral lower extremity swelling on examination.  I do not appreciate any significant JVD and her lungs are clear.  She is currently taking Lasix 20 mg daily.  Though she previously had mild LV dysfunction, more recent echocardiogram in July showed normal LV function with a PASP of 61 mmHg.  At that  time, we did ask her to double up on Lasix for 3 days due to elevated filling pressures.  Given current volume overload, I have asked her to again increase her Lasix to 40 mg daily.  It is possible that prednisone taper back in August caused her to retain additional fluid which she has not yet mobilized.  I will plan to see her back in 1 week to reevaluate and also follow-up lab work.  It is unclear to what extent her atrial fibrillation is playing a role in volume excess.  She was not previously symptomatic when ECG showed atrial flutter.  Continue current doses of carvedilol and losartan.  2.  Persistent atrial fibrillation/atrial flutter: Patient was diagnosed with atrial flutter in March and has been on Eliquis and beta-blocker therapy with good rate control.  EKG today shows atrial fibrillation.  She denies palpitations.  As outlined above, it is unclear to what extent A. fib may be playing a role in her fluid retention and diastolic heart failure.  She was previously asymptomatic.    3.  Essential hypertension: Stable.  4.  Hyperlipidemia: She remains on lovastatin therapy.  5.  Recurrent yeast infections: Status post several Diflucan treatments.  Followed by urology.  6.  Right lower extremity cellulitis: She has been experiencing weeping in the setting of lower extremity edema.  I am hopeful that with diuresis, this area can heal up.  She has been getting wound care at home.  7.  Disposition: Follow-up in 1 week with basic metabolic panel at that time.  Murray Hodgkins, NP 02/05/2019, 4:53 PM

## 2019-02-07 ENCOUNTER — Telehealth: Payer: Self-pay | Admitting: Nurse Practitioner

## 2019-02-07 NOTE — Telephone Encounter (Signed)
I spoke with pt. She reports she has been prescribed myrbetriq but has not been taking. It was on her medication list at office visit earlier this week but pt reports she has not been taking it. She resides at [facility] and reports doctor there wants her to take this but pt does not want to take it.  I asked pt to discuss her concerns with doctor who prescribed this medication for her.

## 2019-02-07 NOTE — Telephone Encounter (Signed)
Please call regarding Lasix. She is at [place] told her to take her a medication to "stop me from going to the bathroom so much", she is not sure the name. She wants to discuss this with the nurse.

## 2019-02-07 NOTE — Telephone Encounter (Signed)
Left message to call back  

## 2019-02-07 NOTE — Telephone Encounter (Signed)
Patient returning call about medication

## 2019-02-13 ENCOUNTER — Other Ambulatory Visit: Payer: Self-pay

## 2019-02-13 ENCOUNTER — Ambulatory Visit: Payer: Medicare Other | Admitting: Physician Assistant

## 2019-02-13 ENCOUNTER — Encounter

## 2019-02-13 ENCOUNTER — Ambulatory Visit (INDEPENDENT_AMBULATORY_CARE_PROVIDER_SITE_OTHER): Payer: Medicare Other | Admitting: Nurse Practitioner

## 2019-02-13 ENCOUNTER — Encounter: Payer: Self-pay | Admitting: Nurse Practitioner

## 2019-02-13 VITALS — BP 160/70 | Ht 66.0 in | Wt 159.0 lb

## 2019-02-13 DIAGNOSIS — I5043 Acute on chronic combined systolic (congestive) and diastolic (congestive) heart failure: Secondary | ICD-10-CM | POA: Diagnosis not present

## 2019-02-13 DIAGNOSIS — I10 Essential (primary) hypertension: Secondary | ICD-10-CM

## 2019-02-13 DIAGNOSIS — I4819 Other persistent atrial fibrillation: Secondary | ICD-10-CM

## 2019-02-13 MED ORDER — FUROSEMIDE 20 MG PO TABS: ORAL_TABLET | ORAL | 3 refills | Status: DC

## 2019-02-13 NOTE — Patient Instructions (Signed)
Medication Instructions:  Your physician recommends that you continue on your current medications as directed. Please refer to the Current Medication list given to you today.  If you need a refill on your cardiac medications before your next appointment, please call your pharmacy.   Lab work: Your physician recommends that you have lab work today(BMET)  If you have labs (blood work) drawn today and your tests are completely normal, you will receive your results only by: Marland Kitchen MyChart Message (if you have MyChart) OR . A paper copy in the mail If you have any lab test that is abnormal or we need to change your treatment, we will call you to review the results.  Testing/Procedures: None ordered   Follow-Up: At [facility], you and your health needs are our priority.  As part of our continuing mission to provide you with exceptional heart care, we have created designated Provider Care Teams.  These Care Teams include your primary Cardiologist (physician) and Advanced Practice Providers (APPs -  Physician Assistants and Nurse Practitioners) who all work together to provide you with the care you need, when you need it. You will need a follow up appointment on 03/05/19 at 10 AM with Ignacia Bayley, NP

## 2019-02-13 NOTE — Progress Notes (Signed)
Office Visit    Patient Name: Jodi Bullock Date of Encounter: 02/13/2019  Primary Care Provider:  [facility] Primary Cardiologist:  Kathlyn Sacramento, MD  Chief Complaint    85 year old female with history of nonischemic cardiomyopathy, chronic combined systolic and diastolic ingestive heart failure, hypertension, hyperlipidemia, thymus cancer, right breast cancer, and A. fib/flutter, who presents for follow-up secondary to lower extremity swelling and dyspnea.  Past Medical History    Past Medical History:  Diagnosis Date   Arthritis    Atrial flutter ([place])    a. Dx 07/2018. CHA2DS2VASc = 5-->Eliquis.   Bladder incontinence    Breast cancer ([place]) 1998   Chronic combined systolic and diastolic CHF (congestive heart failure) ([place]) 01/10/2016   a. 09/2015 Echo: EF 40-45%, possible inf HK, mod PAH (PASP 34mHg); b. 11/2018 Echo: EF 60-65%, RVSP 691mg. Mod dil LA, mildly dil RA. Mod MR.   [place]   a. 08/2017->Managed conservatively.   Depression    GERD (gastroesophageal reflux disease)    History of kidney stones    HLD (hyperlipidemia)    Hypertension    Neuropathy of both feet    [place]mic [place]   a. 08/2015 Lexiscan MV: EF 39%, no ischemia; b. 09/2015 Echo: EF 40-45%.   Persistent atrial fibrillation    Right Humerus head fracture    a. 07/2016 - managed conservatively.   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[facility]   cancer   Past Surgical History:  Procedure Laterality Date   ABDOMINAL HYSTERECTOMY     BREAST LUMPECTOMY     CATARACT EXTRACTION     CHOLECYSTECTOMY     JOINT REPLACEMENT Right    Hip   KNEE SURGERY Right     Allergies  Allergies  Allergen Reactions   2,4-D Dimethylamine (Amisol) Other (See Comments)    Other Reaction: OTHER REACTION-IRRITATION TO Esophagous   Celebrex [Celecoxib] Other (See Comments)     esophagitis Esophageal spasms     Ciprofloxacin Other (See Comments)      Esophageal irritation   Ibuprofen     Other reaction(s): Other (See Comments) reflux   Levamisole Other (See Comments) and Nausea And Vomiting   Metronidazole Other (See Comments)    neuropathy   Naproxen Other (See Comments)    GI UPSET   Other Other (See Comments)    Other Reaction: esophagitis    History of Present Illness    [age]ear old female with above complex past medical history including hypertension, persistent a flutter/fib, hyperlipidemia, GERD, nonischemic cardiomyopathy, chronic combined heart failure, thymus cancer, and remote breast cancer.  Cardiac history dates back to April 2017, when she was evaluated with exertional dyspnea.  Stress testing showed an EF of 39% without ischemia.  Follow-up echo showed an EF of 40 to 45% with possible inferior hypokinesis.  She is been managed with beta-blocker and ARB and has more recently required daily Lasix.  In March 2020, she was diagnosed with atrial flutter which was felt to be asymptomatic.  She has been on Eliquis since then.  In late July/early August, she was treated for bronchitis with Augmentin and a prednisone taper.  Following that, she developed a yeast infection and required rounds of Diflucan.  In late August, she also developed right leg pain.  Ultrasound was negative for DVT but she was seen in the ER on September 4 and CT of the abdomen showed subacute vertical fracture of the left sacral alla and subacute fracture  of the right pubic bone.  She thinks perhaps this occurred when she slipped in the shower in July.  She was also diagnosed with right lower extremity cellulitis at that time and was treated with Keflex and given a dose of Diflucan to take after she completed the course of antibiotics given recent yeast infections.  I saw her in clinic on September 23 and she complained of significant dyspnea on exertion and right greater than left lower extremity swelling despite compliance with her Lasix.  Dyspnea and  lower extremity swelling seem to date back to her prednisone taper in August.  Her weight was up 5 pounds and I asked her to double her Lasix with a plan for follow-up today.  She did note an improvement in her dyspnea on exertion and also lower extremity swelling following increase in Lasix dose.  She continues to have 1-2+ right lower extremity swelling with only trace left lower extremity swelling.  She denies chest pain, palpitations, PND, orthopnea, dizziness, syncope, or early satiety.  Her weight was down 4 pounds on her scale at home earlier this week but she put back on 2 pounds.  Home Medications    Prior to Admission medications   Medication Sig Start Date End Date Taking? Authorizing Provider  acetaminophen (TYLENOL) 500 MG tablet Take 500 mg by mouth every 4 (four) hours as needed for mild pain or fever.    [provider]  acetaminophen (TYLENOL) 650 MG CR tablet Take 650 mg by mouth 3 (three) times daily.    [provider]  alum & mag hydroxide-simeth (MINTOX) 200-200-20 MG/5ML suspension Take 30 mLs by mouth at bedtime as needed for indigestion or heartburn.    [provider]  apixaban (ELIQUIS) 5 MG TABS tablet Take 1 tablet (5 mg total) by mouth 2 (two) times daily. 07/19/18   Theora Gianotti, NP  carbamide peroxide (GLY-OXIDE) 10 % solution Place 1 application onto teeth 4 (four) times daily -  with meals and at bedtime. (as needed for dry mouth)    [provider]  carvedilol (COREG) 12.5 MG tablet Take 1.5 tablets (18.75 mg total) by mouth 2 (two) times daily. 02/22/18 09/19/27  Theora Gianotti, NP  Cholecalciferol 2000 UNITS TABS Take 2,000 Units by mouth daily.     Roselee Nova, MD  Cyanocobalamin (VITAMIN DEFICIENCY SYSTEM-B12) 1000 MCG/ML KIT Inject 1,000 mcg as directed every 30 (thirty) days.     [provider]  diclofenac sodium (VOLTAREN) 1 % GEL Apply 2 g topically 2 (two) times daily as needed (knee  pain).    [provider]  docusate sodium (COLACE) 100 MG capsule Take 100 mg by mouth at bedtime.     [provider]  fluticasone (FLONASE) 50 MCG/ACT nasal spray Place 1 spray into both nostrils daily. 08/30/17   Wilhelmina Mcardle, MD  Fluticasone Furoate (ARNUITY ELLIPTA) 100 MCG/ACT AEPB Inhale 1 puff into the lungs daily. 08/30/17   Wilhelmina Mcardle, MD  furosemide (LASIX) 20 MG tablet Take 2 tablets (40MG) by mouth daily. 02/05/19   Theora Gianotti, NP  guaiFENesin (MUCINEX) 600 MG 12 hr tablet Take 600 mg by mouth 2 (two) times daily as needed for cough or to loosen phlegm.     [provider]  guaiFENesin (ROBITUSSIN) 100 MG/5ML SOLN Take 10 mLs by mouth every 6 (six) hours as needed for cough or to loosen phlegm.    [provider]  lidocaine (LIDODERM) 5 %  Place 1 patch onto the skin daily. Remove & Discard patch within 12 hours or as directed by MD    [provider]  Lidocaine 4 % PTCH Apply 1 patch topically at bedtime.    [provider]  loperamide (IMODIUM A-D) 2 MG tablet Take 2 mg by mouth as needed for diarrhea or loose stools (max 4 doses daily).     [provider]  LORazepam (ATIVAN) 0.5 MG tablet Take 1 tablet (0.5 mg total) by mouth 2 (two) times daily as needed for anxiety. Patient taking differently: Take 0.5 mg by mouth 2 (two) times daily.  05/23/17   Roselee Nova, MD  losartan (COZAAR) 100 MG tablet Take 1 tablet (100 mg total) by mouth daily. 05/23/17   Roselee Nova, MD  lovastatin (MEVACOR) 40 MG tablet Take 1 tablet (40 mg total) by mouth daily. Patient taking differently: Take 40 mg by mouth every Monday, Wednesday, and Friday.  05/23/17   Roselee Nova, MD  Lutein 10 MG TABS Take 10 mg by mouth 2 (two) times daily.    [provider]  magnesium hydroxide (MILK OF MAGNESIA) 400 MG/5ML suspension Take 30 mLs by mouth at bedtime as needed for mild constipation.    [provider]  mirabegron ER (MYRBETRIQ) 25 MG TB24 tablet Take 25 mg by mouth every evening.    [provider]  neomycin-bacitracin-polymyxin (NEOSPORIN) [phone] ointment Apply topically 4 (four) times daily as needed (wound care).    [provider]  polyethylene glycol (MIRALAX / GLYCOLAX) 17 g packet Take 17 g by mouth daily.     [provider]  Saccharomyces boulardii (PROBIOTIC) 250 MG CAPS Take 1 capsule by mouth daily.     [provider]  traMADol (ULTRAM) 50 MG tablet Take 50 mg by mouth daily.     [provider]  traMADol (ULTRAM) 50 MG tablet Take 50 mg by mouth every 8 (eight) hours as needed for moderate pain.    [provider]  trimethoprim (TRIMPEX) 100 MG tablet Take 1 tablet (100 mg total) by mouth daily. 03/04/18   Bjorn Loser, MD    Review of Systems    Still with some dyspnea on exertion though improved.  Likewise, still with some lower extremity edema but improved.  She denies chest pain, palpitations, PND, orthopnea, dizziness, syncope, or early satiety.  All other systems reviewed and are otherwise negative except as noted above.  Physical Exam    VS:  BP (!) 160/70 (BP Location: Left Arm, Patient Position: Sitting, Cuff Size: Normal)    Ht _0  (1.676 m)    Wt 159 lb (72.1 kg)    BMI 25.66 kg/m  , BMI Body mass index is 25.66 kg/m. GEN: Well nourished, well developed, in no acute distress. HEENT: normal. Neck: Supple, no JVD, carotid bruits, or masses. Cardiac: RRR, no murmurs, rubs, or gallops. No clubbing, cyanosis, edema.  Radials/DP/PT 2+ and equal bilaterally.  Respiratory:  Respirations regular and unlabored, clear to auscultation bilaterally. GI: Soft, nontender, nondistended, BS + x 4. MS: no deformity or atrophy. Skin: warm and dry, no rash. Neuro:  Strength and sensation are intact. Psych: Normal affect.  Accessory Clinical Findings    ECG personally reviewed by me today -atrial  fibrillation, 66, inferior and anterior infarcts - no acute changes.  Lab Results  Component Value Date   WBC 6.8 01/17/2019   HGB 12.7 01/17/2019   HCT 40.9  01/17/2019   MCV 95.6 01/17/2019   PLT 189 01/17/2019   Lab Results  Component Value Date   CREATININE 0.73 01/17/2019   BUN 19 01/17/2019   NA 140 01/17/2019   K 4.3 01/17/2019   CL 103 01/17/2019   CO2 28 01/17/2019   Lab Results  Component Value Date   ALT 14 01/17/2019   AST 18 01/17/2019   ALKPHOS 103 01/17/2019   BILITOT 0.3 01/17/2019   Lab Results  Component Value Date   CHOL 139 05/24/2017   HDL 48 (L) 05/24/2017   LDLCALC 67 05/24/2017   TRIG 163 (H) 05/24/2017   CHOLHDL 2.9 05/24/2017     Assessment & Plan    1.  Acute on chronic combined systolic and diastolic congestive heart failure: I saw her September 23 and she complained of a 6-week history of increasing lower extremity edema and dyspnea that began in early August following a prednisone taper.  Her weight was up 5 pounds over her usual baseline.  I increase her Lasix to 40 mg daily and based on her home scale readings, her weight did come down 4 pounds though she was up to to this this morning and was 156 on her home scale.  She is 159 on our scale.  She has noted improvement in lower extremity swelling as well as with dyspnea on exertion.  I am going to follow-up a basic metabolic panel today and have recommended we continue Lasix 40 mg daily.  I suspect residual right lower extremity swelling is at least partially related to recent cellulitis in that leg and have encouraged her to keep it elevated as much as possible.  Recent lower extremity ultrasound was negative for DVT.  She does note that the leg is not a swollen first thing in the morning compared to the end of the day.  She does have compression socks but has difficulty wearing these.  Her blood pressure is elevated today though this is usually much better controlled.  As above, continue higher  dose of Lasix along with current doses of carvedilol, losartan.  2.  Persistent atrial fibrillation/atrial flutter: She was diagnosed with atrial flutter in March and has been on Eliquis and beta-blocker therapy with good rate control.  EKG continues to show atrial fibrillation.  She denies palpitations.  As previously noted, it is unclear to what extent A. fib may be playing a role in her fluid retention and diastolic heart failure though she does appear to be stabilizing.  No plan for rhythm management at this time.  3.  Essential hypertension: Pressure elevated today.  Continue higher dose of Lasix and current regimen.  She says she just feels flustered today and thinks that is driving up her blood pressure.  4.  Hyperlipidemia: Continue statin therapy.  5.  Right lower extremity cellulitis: Still with 1-2+ lower extremity swelling and some tenderness but no evidence of erythema.  She is already been treated with antibiotics for this.  Recommended that she continue to keep that right leg elevated and she has been receiving wound care at home.  6.  Disposition: I will plan to see her back in clinic on October 21 when she brings her husband in.  Follow-up basic metabolic panel today.  Murray Hodgkins, NP 02/13/2019, 10:09 AM

## 2019-02-14 LAB — BASIC METABOLIC PANEL
BUN/Creatinine Ratio: 19 (ref 12–28)
BUN: 16 mg/dL (ref 10–36)
CO2: 26 mmol/L (ref 20–29)
Calcium: 9.1 mg/dL (ref 8.7–10.3)
Chloride: 105 mmol/L (ref 96–106)
Creatinine, Ser: 0.86 mg/dL (ref 0.57–1.00)
GFR calc Af Amer: 68 mL/min/{1.73_m2} (ref >59)
GFR calc non Af Amer: 59 mL/min/{1.73_m2} — ABNORMAL LOW (ref >59)
Glucose: 121 mg/dL — ABNORMAL HIGH (ref 65–99)
Potassium: 4.5 mmol/L (ref 3.5–5.2)
Sodium: 143 mmol/L (ref 134–144)

## 2019-02-14 NOTE — Telephone Encounter (Signed)
lmtcb with questions, results released to [employer]

## 2019-02-17 ENCOUNTER — Telehealth: Payer: Self-pay | Admitting: Cardiovascular Disease

## 2019-02-17 NOTE — Telephone Encounter (Signed)
Returned the call to [employer]. Jodi Bullock is in a meeting. lmtcb.

## 2019-02-17 NOTE — Telephone Encounter (Signed)
Jodi Bullock returning call .

## 2019-02-17 NOTE — Telephone Encounter (Signed)
Brandy from [employer] patients legs are leaking - but do not seem swollen Patient denies SOB - is taking fluid pills Please call Theadora Rama to discuss at [phone]

## 2019-02-18 NOTE — Telephone Encounter (Signed)
Attempted to return the call to [facility]. Held the line for >60min and then the call disconnected.  Contacted the patient directly. Patient denies any weeping of her legs. Patient sts that her LE edema has improved. It is better in the morning and a little worse in the evening. She does try to elevate her legs during the day. Patient is wrapping her leg daily. She denies redness of her legs. She is taking Lasix 40mg  daily. Patient sts that her weight is down 6-7lbs, her sob has improved. She denies fever, increased swelling, orthopnea, PND. She has completed her antibiotic and has since developed a yeast infection. Her home health nurse will be out to see her yesterday and will be back out tomorrow 02/19/19. The nurse did not advise her of any concerns regarding her legs. Patient sts that she thinks she is doing well and she is not sure why [place] contacted our office.  Advised the patient to continue the current POC and to keep her 03/05/19 app with Ignacia Bayley, NP. Adv the patient to contact the office in the interim if symptoms worsen. Adv the patient that I will fwd an update to [place] and cal back if he has additional recommendations.  Patient is agreeable with the plan and voiced appreciation for the call.

## 2019-03-05 ENCOUNTER — Encounter: Payer: Self-pay | Admitting: Nurse Practitioner

## 2019-03-05 ENCOUNTER — Other Ambulatory Visit: Payer: Self-pay

## 2019-03-05 ENCOUNTER — Ambulatory Visit (INDEPENDENT_AMBULATORY_CARE_PROVIDER_SITE_OTHER): Payer: Medicare Other | Admitting: Nurse Practitioner

## 2019-03-05 VITALS — BP 110/60 | Ht 66.0 in

## 2019-03-05 DIAGNOSIS — I5042 Chronic combined systolic (congestive) and diastolic (congestive) heart failure: Secondary | ICD-10-CM

## 2019-03-05 DIAGNOSIS — E782 Mixed hyperlipidemia: Secondary | ICD-10-CM

## 2019-03-05 DIAGNOSIS — I428 Other cardiomyopathies: Secondary | ICD-10-CM | POA: Diagnosis not present

## 2019-03-05 DIAGNOSIS — I4892 Unspecified atrial flutter: Secondary | ICD-10-CM | POA: Diagnosis not present

## 2019-03-05 DIAGNOSIS — I10 Essential (primary) hypertension: Secondary | ICD-10-CM

## 2019-03-05 NOTE — Progress Notes (Signed)
Office Visit    Patient Name: Jodi Bullock Date of Encounter: 03/05/2019  Primary Care Provider:  [facility] Primary Cardiologist:  Kathlyn Sacramento, MD  Chief Complaint    85 y/o ? with history of nonischemic cardiomyopathy, chronic combined systolic and diastolic congestive heart failure, hypertension, hyperlipidemia, thymus cancer, right breast cancer, and A. fib/flutter, who presents for follow-up secondary to lower extremity swelling and dyspnea.  Past Medical History    Past Medical History:  Diagnosis Date  . Arthritis   . Atrial flutter ([place])    a. Dx 07/2018. CHA2DS2VASc = 5-->Eliquis.  . Bladder incontinence   . Breast cancer ([place]) 1998  . Chronic combined systolic and diastolic CHF (congestive heart failure) ([place]) 01/10/2016   a. 09/2015 Echo: EF 40-45%, possible inf HK, mod PAH (PASP 66mHg); b. 11/2018 Echo: EF 60-65%, RVSP 633mg. Mod dil LA, mildly dil RA. Mod MR.  . Cl[place] fracture of right [place] ([facility]   a. 08/2017->Managed conservatively.  . Depression   . GERD (gastroesophageal reflux disease)   . History of kidney stones   . HLD (hyperlipidemia)   . Hypertension   . Neuropathy of both feet   . NICM (nonischemic cardiomyopathy) ([facility]   a. 08/2015 Lexiscan MV: EF 39%, no ischemia; b. 09/2015 Echo: EF 40-45%.  . Persistent atrial fibrillation ([facility]  . Right Humerus head fracture    a. 07/2016 - managed conservatively.  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   Past Surgical History:  Procedure Laterality Date  . ABDOMINAL HYSTERECTOMY    . BREAST LUMPECTOMY    . CATARACT EXTRACTION    . CHOLECYSTECTOMY    . JOINT REPLACEMENT Right    Hip  . KNEE SURGERY Right     Allergies  Allergies  Allergen Reactions  . 2,4-D Dimethylamine (Amisol) Other (See Comments)    Other Reaction: OTHER REACTION-IRRITATION TO Esophagous  . Celebrex [Celecoxib] Other (See Comments)     esophagitis Esophageal spasms    . Ciprofloxacin Other (See Comments)   Esophageal irritation  . Ibuprofen     Other reaction(s): Other (See Comments) reflux  . Levamisole Other (See Comments) and Nausea And Vomiting  . Metronidazole Other (See Comments)    neuropathy  . Naproxen Other (See Comments)    GI UPSET  . Other Other (See Comments)    Other Reaction: esophagitis    History of Present Illness    [age]ear old female with above complex past medical history including hypertension, persistent atrial flutter/fib, hyperlipidemia, GERD, nonischemic cardiomyopathy, chronic combined congestive heart failure, thymus cancer, and remote breast cancer.  Cardiac history dates back to April 2017, when she was evaluated with exertional dyspnea.  Stress testing showed an EF of 39% without ischemia.  Follow-up echo showed an EF of 40 to 45% with possible inferior hypokinesis.  She has been managed with beta-blocker and ARB and has more recently required daily Lasix.  In March 2020, she was diagnosed with atrial flutter which was felt to be asymptomatic.  She has been on Eliquis since then.  In late July/early August, she was treated for bronchitis with Augmentin and prednisone taper.  Following that she developed recurrent yeast infections and required multiple rounds of Diflucan.  In late August 2020, she developed right leg pain.  Ultrasound was negative for DVT but she was seen in the ER on September 4 and CT of the abdomen showed subacute vertical fracture of the left sacral alla and subacute fracture  of the right pubic bone.  She thinks that fracture might of occurred when she slipped in the shower in July.  She was also diagnosed with right lower extremity cellulitis at that time and was treated with Keflex and given a dose of Diflucan to take after she completed the course of antibiotics given recent yeast infections.  I saw her in clinic on September 23 at which time she complained of dyspnea on exertion and right greater than left lower extremity swelling despite  compliance with Lasix.  Weight was up 5 pounds and I had her double her Lasix for a week.  When seen in follow-up on October 1, she had improvement in both lower extremity swelling and dyspnea on the higher dose of Lasix.  We agreed to continue Lasix 40 mg daily and follow-up lab work that day showed stable renal function and electrolytes.  Since her October 1 visit, she has been relatively stable.  Her weight, which was 159 pounds on our scale on October 1 (down from 160 on September 23), has continued to trend down at home.  She has had some morning weights in the high 140s.  She has noted significant improvement in bilateral lower extremity swelling and has only minimal/trace right lower extremity swelling at this point.  Providers at her facility had applied Unna boots about 2 weeks ago.  These are off today and she is hoping to just use compression hose going forward.  She has multiple complaints about the care provided at [facility] as well as the food.  She notes that she eats very little-usually cereal for breakfast and then some sort of sandwich (pimento or ham and cheese) for dinner.  She denies chest pain, palpitations, dizziness, syncope, or early satiety.  Overall, dyspnea on exertion has improved or at least return back to prior baseline.  Home Medications    Prior to Admission medications   Medication Sig Start Date End Date Taking? Authorizing Provider  acetaminophen (TYLENOL) 500 MG tablet Take 500 mg by mouth every 4 (four) hours as needed for mild pain or fever.    [provider]  acetaminophen (TYLENOL) 650 MG CR tablet Take 650 mg by mouth 3 (three) times daily.    [provider]  alum & mag hydroxide-simeth (MINTOX) 200-200-20 MG/5ML suspension Take 30 mLs by mouth at bedtime as needed for indigestion or heartburn.    [provider]  apixaban (ELIQUIS) 5 MG TABS tablet Take 1 tablet (5 mg total) by mouth 2 (two) times daily. 07/19/18   Theora Gianotti, NP  carbamide peroxide (GLY-OXIDE) 10 % solution Place 1 application onto teeth 4 (four) times daily -  with meals and at bedtime. (as needed for dry mouth)    [provider]  carvedilol (COREG) 12.5 MG tablet Take 1.5 tablets (18.75 mg total) by mouth 2 (two) times daily. 02/22/18 09/19/27  Theora Gianotti, NP  Cholecalciferol 2000 UNITS TABS Take 2,000 Units by mouth daily.     Roselee Nova, MD  Cyanocobalamin (VITAMIN DEFICIENCY SYSTEM-B12) 1000 MCG/ML KIT Inject 1,000 mcg as directed every 30 (thirty) days.     [provider]  diclofenac sodium (VOLTAREN) 1 % GEL Apply 2 g topically 2 (two) times daily as needed (knee pain).    [provider]  docusate sodium (COLACE) 100 MG capsule Take 100 mg by mouth at bedtime.     [provider]  fluticasone (FLONASE) 50 MCG/ACT nasal spray Place 1  spray into both nostrils daily. 08/30/17   Wilhelmina Mcardle, MD  Fluticasone Furoate (ARNUITY ELLIPTA) 100 MCG/ACT AEPB Inhale 1 puff into the lungs daily. 08/30/17   Wilhelmina Mcardle, MD  furosemide (LASIX) 20 MG tablet Take 2 tablets (40MG) by mouth daily. 02/13/19   Theora Gianotti, NP  guaiFENesin (MUCINEX) 600 MG 12 hr tablet Take 600 mg by mouth 2 (two) times daily as needed for cough or to loosen phlegm.     [provider]  guaiFENesin (ROBITUSSIN) 100 MG/5ML SOLN Take 10 mLs by mouth every 6 (six) hours as needed for cough or to loosen phlegm.    [provider]  lidocaine (LIDODERM) 5 % Place 1 patch onto the skin daily. Remove & Discard patch within 12 hours or as directed by MD    [provider]  Lidocaine 4 % PTCH Apply 1 patch topically at bedtime.    [provider]  loperamide (IMODIUM A-D) 2 MG tablet Take 2 mg by mouth as needed for diarrhea or loose stools (max 4 doses daily).     [provider]  LORazepam (ATIVAN) 0.5 MG tablet Take 1 tablet (0.5 mg total) by mouth 2  (two) times daily as needed for anxiety. Patient taking differently: Take 0.5 mg by mouth 2 (two) times daily.  05/23/17   Roselee Nova, MD  losartan (COZAAR) 100 MG tablet Take 1 tablet (100 mg total) by mouth daily. 05/23/17   Roselee Nova, MD  lovastatin (MEVACOR) 40 MG tablet Take 1 tablet (40 mg total) by mouth daily. Patient taking differently: Take 40 mg by mouth every Monday, Wednesday, and Friday.  05/23/17   Roselee Nova, MD  Lutein 10 MG TABS Take 10 mg by mouth 2 (two) times daily.    [provider]  magnesium hydroxide (MILK OF MAGNESIA) 400 MG/5ML suspension Take 30 mLs by mouth at bedtime as needed for mild constipation.    [provider]  mirabegron ER (MYRBETRIQ) 25 MG TB24 tablet Take 25 mg by mouth every evening.    [provider]  neomycin-bacitracin-polymyxin (NEOSPORIN) [id number] ointment Apply topically 4 (four) times daily as needed (wound care).    [provider]  polyethylene glycol (MIRALAX / GLYCOLAX) 17 g packet Take 17 g by mouth daily.     [provider]  Saccharomyces boulardii (PROBIOTIC) 250 MG CAPS Take 1 capsule by mouth daily.     [provider]  traMADol (ULTRAM) 50 MG tablet Take 50 mg by mouth daily.     [provider]  traMADol (ULTRAM) 50 MG tablet Take 50 mg by mouth every 8 (eight) hours as needed for moderate pain.    [provider]  trimethoprim (TRIMPEX) 100 MG tablet Take 1 tablet (100 mg total) by mouth daily. 03/04/18   Bjorn Loser, MD    Review of Systems    Stable dyspnea on exertion, which has improved since her last visit.  Only mild right lower extremity swelling which is an overall improvement.  She denies chest pain, palpitations, dizziness, syncope, or edema.  She notes appetite is poor.  All other systems reviewed and are otherwise negative except as noted above.  Physical Exam    VS:  BP 110/60 (BP Location: Right Arm, Patient Position:  Sitting, Cuff Size: Normal)   Ht _0  (1.676 m)   SpO2 97%   BMI 25.66 kg/m  , BMI Body mass index is 25.66 kg/m.  149 pounds on her home scale this morning. GEN: Well nourished, well developed, in no acute distress. HEENT: normal. Neck: Supple, no JVD, carotid bruits, or masses. Cardiac: Irregularly irregular, no murmurs, rubs, or gallops. No clubbing, cyanosis, trace right malleolus edema.  Overall both legs are much improved.  Radials/PT 2+ and equal bilaterally.  Respiratory:  Respirations regular and unlabored, clear to auscultation bilaterally. GI: Soft, nontender, nondistended, BS + x 4. MS: no deformity or atrophy. Skin: warm and dry, no rash. Neuro:  Strength and sensation are intact. Psych: Normal affect.  Accessory Clinical Findings    Basic metabolic panel is pending today.  Lab Results  Component Value Date   WBC 6.8 01/17/2019   HGB 12.7 01/17/2019   HCT 40.9 01/17/2019   MCV 95.6 01/17/2019   PLT 189 01/17/2019   Lab Results  Component Value Date   CREATININE 0.86 02/13/2019   BUN 16 02/13/2019   NA 143 02/13/2019   K 4.5 02/13/2019   CL 105 02/13/2019   CO2 26 02/13/2019   Lab Results  Component Value Date   ALT 14 01/17/2019   AST 18 01/17/2019   ALKPHOS 103 01/17/2019   BILITOT 0.3 01/17/2019   Lab Results  Component Value Date   CHOL 139 05/24/2017   HDL 48 (L) 05/24/2017   LDLCALC 67 05/24/2017   TRIG 163 (H) 05/24/2017   CHOLHDL 2.9 05/24/2017     Assessment & Plan    1.  Chronic combined systolic and diastolic congestive heart failure/nonischemic cardiomyopathy: Significant improvement over the past month on Lasix 40 mg daily.  She has some degree of chronic dyspnea on exertion which is now back to prior baseline.  Her weight has been trending down at home and was 149 pounds on her scale this morning.  She was as high as 156 on her home scale earlier this month and even higher late last month.  Volume status is stable.  Given ongoing  weight loss, I am going to follow-up a basic metabolic panel today.  If creatinine elevated, will reduce Lasix back to 20 mg daily.  I am somewhat concerned about her ongoing unintentional weight loss and I am not sure that is entirely attributable to ongoing diuresis.  If renal function stable, I think she would benefit from seeing a dietitian and probably adding a protein supplement to her diet.  Continue current medications including beta-blocker and ARB.  2.  Persistent atrial fibrillation/flutter: Heart rate stable on beta-blocker therapy.  She is anticoagulated with Eliquis with stable H&H in September.  It is unclear to what extent ongoing atrial flutter may be playing a role in her heart failure symptoms though given improvement, will continue with plan for rate control.  3.  Essential hypertension: Stable.  4.  Hyperlipidemia: Continue statin therapy.  5.  Right lower extremity cellulitis: This has more or less resolved.  She has minimal/trace right malleolar swelling.  Staff at [facility] had applied [employer] previously.  I suspect she will be just fine with compression stockings going forward.  6.  Disposition: Follow-up basic metabolic panel today.  Follow-up in clinic in 4 to 6 weeks or sooner if necessary.   Murray Hodgkins, NP 03/05/2019, 10:26 AM

## 2019-03-05 NOTE — Patient Instructions (Signed)
Medication Instructions:   1. Your physician recommends that you continue on your current medications as directed. Please refer to the Current Medication list given to you today.  *If you need a refill on your cardiac medications before your next appointment, please call your pharmacy*  Lab Work:  1. Your physician recommends that you return for lab work TODAY: BMET  If you have labs (blood work) drawn today and your tests are completely normal, you will receive your results only by: Marland Kitchen MyChart Message (if you have MyChart) OR . A paper copy in the mail If you have any lab test that is abnormal or we need to change your treatment, we will call you to review the results.  Testing/Procedures:  1. None Ordered  Follow-Up: At [facility], you and your health needs are our priority.  As part of our continuing mission to provide you with exceptional heart care, we have created designated Provider Care Teams.  These Care Teams include your primary Cardiologist (physician) and Advanced Practice Providers (APPs -  Physician Assistants and Nurse Practitioners) who all work together to provide you with the care you need, when you need it.  Your next appointment:   4-6 weeks  The format for your next appointment:   In Person  Provider:    You may see Kathlyn Sacramento, MD or Murray Hodgkins, NP

## 2019-03-06 LAB — BASIC METABOLIC PANEL
BUN/Creatinine Ratio: 20 (ref 12–28)
BUN: 18 mg/dL (ref 10–36)
CO2: 24 mmol/L (ref 20–29)
Calcium: 9.2 mg/dL (ref 8.7–10.3)
Chloride: 105 mmol/L (ref 96–106)
Creatinine, Ser: 0.91 mg/dL (ref 0.57–1.00)
GFR calc Af Amer: 64 mL/min/{1.73_m2} (ref >59)
GFR calc non Af Amer: 55 mL/min/{1.73_m2} — ABNORMAL LOW (ref >59)
Glucose: 140 mg/dL — ABNORMAL HIGH (ref 65–99)
Potassium: 4.7 mmol/L (ref 3.5–5.2)
Sodium: 142 mmol/L (ref 134–144)

## 2019-03-11 ENCOUNTER — Telehealth: Payer: Self-pay | Admitting: Cardiovascular Disease

## 2019-03-11 NOTE — Telephone Encounter (Signed)
Spoke with the patient. Adv her that I do not see any documentation of someone at our office trying to reach her.  Patient is complaining of vaginal itching and burning for the last 2 days. She denies fever or chills. Adv the patient to contact her pcp doctors making house calls to make them aware of her symptoms. Patient verbalized understanding.

## 2019-03-11 NOTE — Telephone Encounter (Signed)
Patient calling States she received a VM yesterday and was returning call   Made her aware that it did not look like a nurse had called and that it was most likely an automated call to remind her husband Armp of an appointment on 10/29  Patient upset and told me to get someone - I told her I would have a nurse call Please call to discuss

## 2019-03-15 ENCOUNTER — Emergency Department
Admission: EM | Admit: 2019-03-15 | Discharge: 2019-03-15 | Disposition: A | Discharge status: Home / Self Care | Payer: Medicare Other | Attending: Emergency Medicine | Admitting: Emergency Medicine

## 2019-03-15 ENCOUNTER — Other Ambulatory Visit: Payer: Self-pay

## 2019-03-15 ENCOUNTER — Emergency Department: Payer: Medicare Other

## 2019-03-15 DIAGNOSIS — N3000 Acute cystitis without hematuria: Secondary | ICD-10-CM | POA: Diagnosis not present

## 2019-03-15 DIAGNOSIS — I959 Hypotension, unspecified: Secondary | ICD-10-CM | POA: Insufficient documentation

## 2019-03-15 DIAGNOSIS — Z7901 Long term (current) use of anticoagulants: Secondary | ICD-10-CM | POA: Diagnosis not present

## 2019-03-15 DIAGNOSIS — Z79899 Other long term (current) drug therapy: Secondary | ICD-10-CM | POA: Diagnosis not present

## 2019-03-15 DIAGNOSIS — R531 Weakness: Secondary | ICD-10-CM | POA: Diagnosis present

## 2019-03-15 DIAGNOSIS — Z87891 Personal history of nicotine dependence: Secondary | ICD-10-CM | POA: Diagnosis not present

## 2019-03-15 DIAGNOSIS — R42 Dizziness and giddiness: Secondary | ICD-10-CM | POA: Diagnosis not present

## 2019-03-15 DIAGNOSIS — I5042 Chronic combined systolic (congestive) and diastolic (congestive) heart failure: Secondary | ICD-10-CM | POA: Diagnosis not present

## 2019-03-15 DIAGNOSIS — R2243 Localized swelling, mass and lump, lower limb, bilateral: Secondary | ICD-10-CM | POA: Insufficient documentation

## 2019-03-15 DIAGNOSIS — I110 Hypertensive heart disease with heart failure: Secondary | ICD-10-CM | POA: Diagnosis not present

## 2019-03-15 LAB — CBC WITH DIFFERENTIAL/PLATELET
Abs Immature Granulocytes: 0.04 10*3/uL (ref 0.00–0.07)
Basophils Absolute: 0.1 10*3/uL (ref 0.0–0.1)
Basophils Relative: 1 %
Eosinophils Absolute: 0.4 10*3/uL (ref 0.0–0.5)
Eosinophils Relative: 6 %
HCT: 45.4 % (ref 36.0–46.0)
Hemoglobin: 14.3 g/dL (ref 12.0–15.0)
Immature Granulocytes: 1 %
Lymphocytes Relative: 21 %
Lymphs Abs: 1.5 10*3/uL (ref 0.7–4.0)
MCH: 28.9 pg (ref 26.0–34.0)
MCHC: 31.5 g/dL (ref 30.0–36.0)
MCV: 91.9 fL (ref 80.0–100.0)
Monocytes Absolute: 0.6 10*3/uL (ref 0.1–1.0)
Monocytes Relative: 8 %
Neutro Abs: 4.7 10*3/uL (ref 1.7–7.7)
Neutrophils Relative %: 63 %
Platelets: 151 10*3/uL (ref 150–400)
RBC: 4.94 MIL/uL (ref 3.87–5.11)
RDW: 13.1 % (ref 11.5–15.5)
WBC: 7.3 10*3/uL (ref 4.0–10.5)
nRBC: 0 % (ref 0.0–0.2)

## 2019-03-15 LAB — COMPREHENSIVE METABOLIC PANEL
ALT: 7 U/L (ref 0–44)
AST: 23 U/L (ref 15–41)
Albumin: 3.8 g/dL (ref 3.5–5.0)
Alkaline Phosphatase: 62 U/L (ref 38–126)
Anion gap: 9 (ref 5–15)
BUN: 16 mg/dL (ref 8–23)
CO2: 25 mmol/L (ref 22–32)
Calcium: 9.2 mg/dL (ref 8.9–10.3)
Chloride: 105 mmol/L (ref 98–111)
Creatinine, Ser: 0.87 mg/dL (ref 0.44–1.00)
GFR calc Af Amer: >60 mL/min (ref >60)
GFR calc non Af Amer: 58 mL/min — ABNORMAL LOW (ref >60)
Glucose, Bld: 154 mg/dL — ABNORMAL HIGH (ref 70–99)
Potassium: 4.7 mmol/L (ref 3.5–5.1)
Sodium: 139 mmol/L (ref 135–145)
Total Bilirubin: 1 mg/dL (ref 0.3–1.2)
Total Protein: 6.3 g/dL — ABNORMAL LOW (ref 6.5–8.1)

## 2019-03-15 LAB — URINALYSIS, COMPLETE (UACMP) WITH MICROSCOPIC
Bilirubin Urine: NEGATIVE
Glucose, UA: NEGATIVE mg/dL
Hgb urine dipstick: NEGATIVE
Ketones, ur: NEGATIVE mg/dL
Nitrite: NEGATIVE
Protein, ur: NEGATIVE mg/dL
Specific Gravity, Urine: 1.008 (ref 1.005–1.030)
pH: 6 (ref 5.0–8.0)

## 2019-03-15 LAB — BRAIN NATRIURETIC PEPTIDE: B Natriuretic Peptide: 436 pg/mL — ABNORMAL HIGH (ref 0.0–100.0)

## 2019-03-15 MED ORDER — NITROFURANTOIN MONOHYD MACRO 100 MG PO CAPS: 100.0000 mg | ORAL_CAPSULE | Freq: Two times a day (BID) | ORAL | 0 refills | Status: AC

## 2019-03-15 MED ORDER — FLUCONAZOLE 150 MG PO TABS: 150.0000 mg | ORAL_TABLET | Freq: Once | ORAL | 0 refills | Status: AC

## 2019-03-15 NOTE — ED Provider Notes (Signed)
[facility] Emergency Department Provider Note  ____________________________________________   First MD Initiated Contact with Patient 03/15/19 1113     (approximate)  I have reviewed the triage vital signs and the nursing notes.   HISTORY  Chief Complaint Hypotension and Fatigue    HPI Jodi Bullock is a 85 y.o. female  Here with multiple complaints.  Pt's primary complaint is that she woke up this morning and felt very weak. She reports that she was recently increased on her lasix 2/2 leg swelling. She has been "very weak" since then with intermittent severe fatigue. She states that she has been checking her BP and it has been running in the 100s intermittently, which is "too low" for her. She feels very lightheaded and weak when it is this low. She has otherwise continued her meds. This morning, she took her BP and it was in the low 638G systolic. She subsequently requested EMS to be called. Felt tired but no CP, SOB, lightheadedness. She now feels better and BP was elevated w/ EMS.   Pt also reports intermittent but worsening dysuria x 2 weeks. Has not had ABX at her facility. No flank pain, nausea, vomiting.       Past Medical History:  Diagnosis Date   Arthritis    Atrial flutter ([place])    a. Dx 07/2018. CHA2DS2VASc = 5-->Eliquis.   Bladder incontinence    Breast cancer ([place]) 1998   Chronic combined systolic and diastolic CHF (congestive heart failure) ([place]) 01/10/2016   a. 09/2015 Echo: EF 40-45%, possible inf HK, mod PAH (PASP 54mHg); b. 11/2018 Echo: EF 60-65%, RVSP 636mg. Mod dil LA, mildly dil RA. Mod MR.   Closed fracture of right femur ([place]   a. 08/2017->Managed conservatively.   Depression    GERD (gastroesophageal reflux disease)    History of kidney stones    HLD (hyperlipidemia)    Hypertension    Neuropathy of both feet    NICM (nonischemic cardiomyopathy) ([facility]   a. 08/2015 Lexiscan MV: EF 39%, no ischemia; b.  09/2015 Echo: EF 40-45%.   Persistent atrial fibrillation (HCC)    Right Humerus head fracture    a. 07/2016 - managed conservatively.   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    Patient Active Problem List   Diagnosis Date Noted   Femur fracture (HCVaiden04/28/2019   Abnormal gait 12/19/2016   Acquired spondylolisthesis 12/19/2016   Breast lump 12/19/2016   Cervical spondylosis without myelopathy 12/19/2016   Closed Colles' fracture 12/19/2016   Closed fracture of base of neck of femur ([facility]08/11/2016   Closed fracture of lower end of radius and ulna 12/19/2016   Closed fracture of neck of femur ([facility]08/11/2016   Spondylolisthesis, congenital 12/19/2016   Contracture of joint of hand 12/19/2016   Contracture of wrist joint 12/19/2016   Contusion of hand 12/19/2016   Contusion of hip 12/19/2016   Contusion of knee 12/19/2016   Degeneration of intervertebral disc at C4-C5 level 12/19/2016   Degeneration of lumbar intervertebral disc 12/19/2016   Disorder of coccyx 12/19/2016   Dry eyes 12/19/2016   Enthesopathy 12/19/2016   Enthesopathy of hip region 12/19/2016   Hand joint pain 12/19/2016   Hand joint stiff 12/19/2016   Hip pain 12/19/2016   Joint pain 12/19/2016   Knee pain 12/19/2016   Localized, primary osteoarthritis 12/19/2016   Loose body in hip joint 12/19/2016   Lumbar sprain 12/19/2016   Abnormal mammogram 12/19/2016  Meibomian gland dysfunction 12/19/2016   Muscle weakness 12/19/2016   Open fracture of lower end of forearm 12/19/2016   Presbyopia 12/19/2016   Regular astigmatism 12/19/2016   Shoulder joint pain 12/19/2016   Skin sensation disturbance 12/19/2016   Sprain of ankle 12/19/2016   Trochanteric bursitis 12/19/2016   Wrist joint pain 12/19/2016   Stiffness of wrist joint 12/19/2016   Acute bilateral low back pain without sciatica 09/29/2016   Acute bronchitis 06/19/2016   Recurrent productive cough 06/14/2016     Medicare annual wellness visit, subsequent 05/17/2016   Fall in elderly patient 01/27/2016   Sprain of wrist, right 97/35/3299   Systolic CHF with reduced left ventricular function, NYHA class 3 ([place]) 01/10/2016   Productive cough 24/26/8341   Chronic systolic heart failure ([place]) 10/12/2015   Dyspnea on exertion 08/31/2015   Fatigue 08/31/2015   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   Irregular heart rhythm 07/22/2015   Left arm pain 06/29/2015   Dyslipidemia 06/03/2015   Acute recurrent maxillary sinusitis 06/03/2015   Oral mucosal lesion 05/26/2015   Urge incontinence 02/14/2015   Ankle edema 02/02/2015   At risk for falling 01/25/2015   Dizziness 01/25/2015   Acid reflux 01/25/2015   Big thyroid 01/25/2015   Gravida 2 para 2 01/25/2015   Personal history of malignant neoplasm of breast 01/25/2015   Arthritis, degenerative 01/25/2015   Parity 2 01/25/2015   Peripheral blood vessel disorder ([place]) 01/25/2015   Need for vaccination 01/25/2015   Pain in rectum 01/25/2015   Reflux 01/25/2015   Screening for depression 01/25/2015   Disease of accessory sinus 01/25/2015   Headache, temporal 01/25/2015   Primary cancer of thymus ([place]) 01/25/2015   Disease of thyroid gland 01/25/2015   Avitaminosis D 01/25/2015   Family history of diabetes mellitus in father 12/31/2014   Fasting hyperglycemia 12/31/2014   Hypertension 12/01/2014   HLD (hyperlipidemia) 12/01/2014   Anxiety 12/01/2014   Myofascial pain 12/01/2014   Breast CA ([place]) 11/24/2014   Absence of bladder continence 11/24/2014   Urgency of micturation 11/24/2014   Common bile duct dilatation 10/20/2014   Pancreatic cyst 10/20/2014   Kidney stones 10/20/2014   Neuritis or radiculitis due to rupture of lumbar intervertebral disc 06/15/2014   Lumbar canal stenosis 06/15/2014   Degenerative arthritis of lumbar spine 06/15/2014   Lumbar radiculitis 06/15/2014    Osteoarthritis of spine with radiculopathy, lumbar region 06/15/2014   Carrier of infectious disease 07/21/2013   Bloodgood disease 11/05/2012   Arthritis of temporomandibular joint 06/24/2012   Atypical chest pain 01/05/2012   Breath shortness 12/04/2011   Lung mass 11/15/2011   Thyroid nodule 11/15/2011   Age-related macular degeneration, dry 10/03/2011   Disorder of peripheral nervous system 10/03/2011   Chronic rhinitis 03/03/2011   Carotid artery narrowing 03/01/2011   Polypharmacy 12/02/2010    Past Surgical History:  Procedure Laterality Date   ABDOMINAL HYSTERECTOMY     BREAST LUMPECTOMY     CATARACT EXTRACTION     CHOLECYSTECTOMY     JOINT REPLACEMENT Right    Hip   KNEE SURGERY Right     Prior to Admission medications   Medication Sig Start Date End Date Taking? Authorizing Provider  acetaminophen (TYLENOL) 500 MG tablet Take 500 mg by mouth every 4 (four) hours as needed for mild pain or fever.    [provider]  acetaminophen (TYLENOL) 650 MG CR tablet Take 650 mg by mouth 3 (three) times daily.    [provider]  alum & mag hydroxide-simeth (MINTOX) 097-353-29 MG/5ML suspension Take 30 mLs by mouth at bedtime as needed for indigestion or heartburn.    [provider]  apixaban (ELIQUIS) 5 MG TABS tablet Take 1 tablet (5 mg total) by mouth 2 (two) times daily. 07/19/18   Theora Gianotti, NP  carbamide peroxide (GLY-OXIDE) 10 % solution Place 1 application onto teeth 4 (four) times daily -  with meals and at bedtime. (as needed for dry mouth)    [provider]  carvedilol (COREG) 12.5 MG tablet Take 1.5 tablets (18.75 mg total) by mouth 2 (two) times daily. 02/22/18 09/19/27  Theora Gianotti, NP  Cholecalciferol 2000 UNITS TABS Take 2,000 Units by mouth daily.     Roselee Nova, MD  Cyanocobalamin (VITAMIN DEFICIENCY SYSTEM-B12) 1000 MCG/ML KIT Inject 1,000 mcg as directed every 30 (thirty)  days.     [provider]  diclofenac sodium (VOLTAREN) 1 % GEL Apply 2 g topically 2 (two) times daily as needed (knee pain).    [provider]  docusate sodium (COLACE) 100 MG capsule Take 100 mg by mouth at bedtime.     [provider]  fluconazole (DIFLUCAN) 150 MG tablet Take 1 tablet (150 mg total) by mouth once for 1 dose. 03/15/19 03/15/19  Duffy Bruce, MD  fluticasone (FLONASE) 50 MCG/ACT nasal spray Place 1 spray into both nostrils daily. 08/30/17   Wilhelmina Mcardle, MD  Fluticasone Furoate (ARNUITY ELLIPTA) 100 MCG/ACT AEPB Inhale 1 puff into the lungs daily. 08/30/17   Wilhelmina Mcardle, MD  furosemide (LASIX) 20 MG tablet Take 2 tablets (40MG) by mouth daily. Patient taking differently: Take 20 mg by mouth 2 (two) times daily. Take 2 tablets (40MG) by mouth daily. 02/13/19   Theora Gianotti, NP  guaiFENesin (MUCINEX) 600 MG 12 hr tablet Take 600 mg by mouth 2 (two) times daily as needed for cough or to loosen phlegm.     [provider]  guaiFENesin (ROBITUSSIN) 100 MG/5ML SOLN Take 10 mLs by mouth every 6 (six) hours as needed for cough or to loosen phlegm.    [provider]  lidocaine (LIDODERM) 5 % Place 1 patch onto the skin daily. Remove & Discard patch within 12 hours or as directed by MD    [provider]  Lidocaine 4 % PTCH Apply 1 patch topically at bedtime.    [provider]  loperamide (IMODIUM A-D) 2 MG tablet Take 2 mg by mouth as needed for diarrhea or loose stools (max 4 doses daily).     [provider]  LORazepam (ATIVAN) 0.5 MG tablet Take 1 tablet (0.5 mg total) by mouth 2 (two) times daily as needed for anxiety. Patient taking differently: Take 0.5 mg by mouth 2 (two) times daily.  05/23/17   Roselee Nova, MD  losartan (COZAAR) 100 MG tablet Take 1 tablet (100 mg total) by mouth daily. 05/23/17   Roselee Nova, MD  lovastatin (MEVACOR) 40 MG tablet Take 1 tablet (40 mg total) by  mouth daily. Patient taking differently: Take 40 mg by mouth every Monday, Wednesday, and Friday.  05/23/17   Roselee Nova, MD  Lutein 10 MG TABS Take 10 mg by mouth 2 (two) times daily.    [provider]  magnesium hydroxide (MILK OF MAGNESIA) 400 MG/5ML suspension Take 30 mLs by mouth at bedtime as needed for mild constipation.    [provider]  mirabegron  ER (MYRBETRIQ) 25 MG TB24 tablet Take 25 mg by mouth every evening.    [provider]  neomycin-bacitracin-polymyxin (NEOSPORIN) [phone] ointment Apply topically 4 (four) times daily as needed (wound care).    [provider]  nitrofurantoin, macrocrystal-monohydrate, (MACROBID) 100 MG capsule Take 1 capsule (100 mg total) by mouth 2 (two) times daily for 5 days. 03/15/19 03/20/19  Duffy Bruce, MD  polyethylene glycol (MIRALAX / GLYCOLAX) 17 g packet Take 17 g by mouth daily.     [provider]  Saccharomyces boulardii (PROBIOTIC) 250 MG CAPS Take 1 capsule by mouth daily.     [provider]  traMADol (ULTRAM) 50 MG tablet Take 50 mg by mouth daily.     [provider]  traMADol (ULTRAM) 50 MG tablet Take 50 mg by mouth every 8 (eight) hours as needed for moderate pain.    [provider]  trimethoprim (TRIMPEX) 100 MG tablet Take 1 tablet (100 mg total) by mouth daily. 03/04/18   Bjorn Loser, MD    Allergies 2,4-d dimethylamine (amisol); Celebrex [celecoxib]; Ciprofloxacin; Ibuprofen; Levamisole; Metronidazole; Naproxen; and Other  Family History  Problem Relation Age of Onset   Stroke Mother    Diabetes Father    Heart disease Father    Kidney disease Neg Hx    Bladder Cancer Neg Hx    Kidney cancer Neg Hx     Social History Social History   Tobacco Use   Smoking status: Former Smoker   Smokeless tobacco: Never Used   Tobacco comment: smoked in high school for a few years only  Substance Use Topics   Alcohol use: No     Alcohol/week: 0.0 standard drinks   Drug use: No    Review of Systems  Review of Systems  Constitutional: Positive for fatigue. Negative for fever.  HENT: Negative for congestion and sore throat.   Eyes: Negative for visual disturbance.  Respiratory: Negative for cough and shortness of breath.   Cardiovascular: Negative for chest pain.  Gastrointestinal: Negative for abdominal pain, diarrhea, nausea and vomiting.  Genitourinary: Negative for flank pain.  Musculoskeletal: Negative for back pain and neck pain.  Skin: Negative for rash and wound.  Neurological: Positive for light-headedness. Negative for weakness.  All other systems reviewed and are negative.    ____________________________________________  PHYSICAL EXAM:      VITAL SIGNS: ED Triage Vitals  Enc Vitals Group     BP 03/15/19 1101 (!) 180/108     Pulse Rate 03/15/19 1105 73     Resp --      Temp --      Temp src --      SpO2 03/15/19 1105 97 %     Weight 03/15/19 1109 150 lb (68 kg)     Height 03/15/19 1109 _0  (1.676 m)     Head Circumference --      Peak Flow --      Pain Score 03/15/19 1109 0     Pain Loc --      Pain Edu? --      Excl. in [place]? --      Physical Exam Vitals signs and nursing note reviewed.  Constitutional:      General: She is not in acute distress.    Appearance: She is well-developed.  HENT:     Head: Normocephalic and atraumatic.  Eyes:     Conjunctiva/sclera: Conjunctivae normal.  Neck:     Musculoskeletal: Neck supple.  Cardiovascular:  Rate and Rhythm: Normal rate. Rhythm irregular.     Heart sounds: Normal heart sounds. No murmur. No friction rub.  Pulmonary:     Effort: Pulmonary effort is normal. No respiratory distress.     Breath sounds: Normal breath sounds. No wheezing or rales.  Abdominal:     General: There is no distension.     Palpations: Abdomen is soft.     Tenderness: There is no abdominal tenderness.  Musculoskeletal:     Right lower leg: Edema  (1+ pitting) present.     Left lower leg: Edema (1+ pitting) present.  Skin:    General: Skin is warm.     Capillary Refill: Capillary refill takes less than 2 seconds.  Neurological:     Mental Status: She is alert and oriented to person, place, and time.     Motor: No abnormal muscle tone.       ____________________________________________   LABS (all labs ordered are listed, but only abnormal results are displayed)  Labs Reviewed  URINALYSIS, COMPLETE (UACMP) WITH MICROSCOPIC - Abnormal; Notable for the following components:      Result Value   Color, Urine YELLOW (*)    APPearance HAZY (*)    Leukocytes,Ua SMALL (*)    Bacteria, UA RARE (*)    All other components within normal limits  COMPREHENSIVE METABOLIC PANEL - Abnormal; Notable for the following components:   Glucose, Bld 154 (*)    Total Protein 6.3 (*)    GFR calc non Af Amer 58 (*)    All other components within normal limits  BRAIN NATRIURETIC PEPTIDE - Abnormal; Notable for the following components:   B Natriuretic Peptide 436.0 (*)    All other components within normal limits  URINE CULTURE  CBC WITH DIFFERENTIAL/PLATELET    ____________________________________________  EKG: Atrial fibrillation, VR 76. QRS 90, QTc 438. No acute ST  t changes. No evidence of ischemia or infarct. ________________________________________  RADIOLOGY All imaging, including plain films, CT scans, and ultrasounds, independently reviewed by me, and interpretations confirmed via formal radiology reads.  ED MD interpretation:   CXR No acute disease  Official radiology report(s): Dg Chest Portable 1 View  Result Date: 03/15/2019 CLINICAL DATA:  Pt states having low blood pressure. Pt also reports that she is having burn and pain associated with urination. Hx of NICM, hypertension, CHF, Atrial flutter. Former smoker. EXAM: PORTABLE CHEST 1 VIEW COMPARISON:  05/28/2018 FINDINGS: Stable changes from prior cardiac surgery.  Cardiac silhouette is mildly enlarged. No mediastinal or hilar masses. Pulmonary anastomosis staples are noted in the right lower lung. Lungs demonstrate prominent bronchovascular markings and are hyperexpanded. Is additional lung base opacity consistent with scarring. No evidence of pneumonia or pulmonary edema. No convincing pleural effusion and no pneumothorax. Skeletal structures are grossly intact. IMPRESSION: 1. No acute cardiopulmonary disease. Electronically Signed   By: Lajean Manes M.D.   On: 03/15/2019 11:52    ____________________________________________  PROCEDURES   Procedure(s) performed (including Critical Care):  Procedures  ____________________________________________  INITIAL IMPRESSION / MDM / [place] / ED COURSE  As part of my medical decision making, I reviewed the following data within the electronic MEDICAL RECORD NUMBER Notes from prior ED visits and [place] Controlled Substance Database      *Dhani Imel was evaluated in Emergency Department on 03/15/2019 for the symptoms described in the history of present illness. She was evaluated in the context of the global COVID-19 pandemic, which necessitated consideration that the patient might  be at risk for infection with the SARS-CoV-2 virus that causes COVID-19. Institutional protocols and algorithms that pertain to the evaluation of patients at risk for COVID-19 are in a state of rapid change based on information released by regulatory bodies including the CDC and federal and state organizations. These policies and algorithms were followed during the patient's care in the ED.  Some ED evaluations and interventions may be delayed as a result of limited staffing during the pandemic.*   Clinical Course as of Mar 15 1347  Sat Mar 15, 2019  1138 85 yo F here with reported symptomatic transient hypotension, resolved spontaneously now. Unclear etiology - I suspect some of it could be overdiuresis, though repeat BP normal  so could all be measuring error versus medication effect. No fever or signs of sepsis. WBC normal. CMP with normal renal function and LFTs. UA does show mild pyuria - will treat as such, though no CVAT or signs to suggest sepsis. She is at her mental baseline.   [CI]    Clinical Course User Index [CI] Duffy Bruce, MD    Medical Decision Making:  As above. Labs are very reassuring. UA does show mild pyuria, +bacteria though also squams. Given that she has +dysuria, +pelvic TTP, h/o UTIs, with findings, will treat. Of note, has a h/o C. Diff so will be cautious with ABX and choose 5d macrobid, encourage probiotics. Otherwise, labs very reassuring. WBC normal, No anemia. Renal function @ baseline. BP has been normal to elevated here. BNP mildly elevated.  Suspect transient low BP was physiological vs possibly 2/2 recent increase in lasix. Will have her cut back to her usual dose of 20 mg x 3 days and start macrobid. Return precautions given.   ____________________________________________  FINAL CLINICAL IMPRESSION(S) / ED DIAGNOSES  Final diagnoses:  Acute cystitis without hematuria  Transient hypotension     MEDICATIONS GIVEN DURING THIS VISIT:  Medications - No data to display   ED Discharge Orders         Ordered    nitrofurantoin, macrocrystal-monohydrate, (MACROBID) 100 MG capsule  2 times daily     03/15/19 1319    fluconazole (DIFLUCAN) 150 MG tablet   Once     03/15/19 1319           Note:  This document was prepared using Dragon voice recognition software and may include unintentional dictation errors.   Duffy Bruce, MD 03/15/19 [phone]

## 2019-03-15 NOTE — Discharge Instructions (Addendum)
FOR YOUR MEDICATIONS: -INSTEAD of 2 tablets of lasix 20 mg, start taking 1 tablet only for the next 3 days -Monitor your blood pressure at least twice a day while doing so -Call your Doctor or Cardiologist if you have worsening swelling or shortness of breath  We are also going to start you on an antibiotic. -Take with food -Take for 5 days -I have prescribed this to [facility]  I think your symptoms could be due to mild dehydration, but we are hesitant to start you on fluids here. By decreasing your lasix dose for 3 days, this will let you hydrate while also preventing you from accumulating too much fluid.  I have also prescribed a dose of Diflucan to take for YEAST infection after the antibiotics, if you have symptoms of one again (itching, rash, discharge).

## 2019-03-15 NOTE — ED Triage Notes (Signed)
Pt arrives via ems from [facility]. Ems reports pt had BP taken this morning and was 109/76 at facility. Pt told staff that was too low for her, staff retook bp manually and got 150/78 but pt did not believe reading. Pt became anxious and now ems bp was 190/90. Pt a&o x 4, on arrival, NAD noted at this time. Pt also reports that she is having burn and pain associated wit urination.

## 2019-03-15 NOTE — ED Notes (Signed)
Report given to Staff Member Kennyth Lose at [facility].

## 2019-03-17 LAB — URINE CULTURE
Culture: <10000 — AB
Special Requests: NORMAL

## 2019-04-04 ENCOUNTER — Ambulatory Visit: Payer: Medicare Other | Admitting: Urology

## 2019-04-09 ENCOUNTER — Other Ambulatory Visit: Payer: Self-pay

## 2019-04-09 ENCOUNTER — Encounter: Payer: Self-pay | Admitting: Nurse Practitioner

## 2019-04-09 ENCOUNTER — Ambulatory Visit (INDEPENDENT_AMBULATORY_CARE_PROVIDER_SITE_OTHER): Payer: Medicare Other | Admitting: Nurse Practitioner

## 2019-04-09 VITALS — BP 128/68 | HR 69 | Ht 66.0 in | Wt 154.8 lb

## 2019-04-09 DIAGNOSIS — I5042 Chronic combined systolic (congestive) and diastolic (congestive) heart failure: Secondary | ICD-10-CM | POA: Diagnosis not present

## 2019-04-09 DIAGNOSIS — I4819 Other persistent atrial fibrillation: Secondary | ICD-10-CM

## 2019-04-09 DIAGNOSIS — I10 Essential (primary) hypertension: Secondary | ICD-10-CM | POA: Diagnosis not present

## 2019-04-09 NOTE — Progress Notes (Signed)
Office Visit    Patient Name: Jodi Bullock Date of Encounter: 04/09/2019  Primary Care Provider:  [facility] Primary Cardiologist:  Kathlyn Sacramento, MD  Chief Complaint    85 year old female with a history of nonischemic cardiomyopathy, chronic combined systolic and diastolic congestive heart failure, hypertension, hyperlipidemia, thymus cancer, right breast cancer, and A. fib/flutter, who presents for follow-up secondary to lower extremity swelling.  Past Medical History    Past Medical History:  Diagnosis Date   Arthritis    Atrial flutter ([place])    a. Dx 07/2018. CHA2DS2VASc = 5-->Eliquis.   Bladder incontinence    Breast cancer ([place]) 1998   Chronic combined systolic and diastolic CHF (congestive heart failure) ([place]) 01/10/2016   a. 09/2015 Echo: EF 40-45%, possible inf HK, mod PAH (PASP 67mHg); b. 11/2018 Echo: EF 60-65%, RVSP 615mg. Mod dil LA, mildly dil RA. Mod MR.   Closed fracture of right femur ([facility]   a. 08/2017->Managed conservatively.   Depression    GERD (gastroesophageal reflux disease)    History of kidney stones    HLD (hyperlipidemia)    Hypertension    Neuropathy of both feet    NIC[facility] (nonischemic cardiomyopathy) ([facility]   a. 08/2015 Lexiscan MV: EF 39%, no ischemia; b. 09/2015 Echo: EF 40-45%.   Persistent atrial fibrillation ([facility])    Right Humerus head fracture    a. 07/2016 - managed conservatively.   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[facility]   cancer   Past Surgical History:  Procedure Laterality Date   ABDOMINAL HYSTERECTOMY     BREAST LUMPECTOMY     CATARACT EXTRACTION     CHOLECYSTECTOMY     JOINT REPLACEMENT Right    Hip   KNEE SURGERY Right     Allergies  Allergies  Allergen Reactions   2,4-D Dimethylamine (Amisol) Other (See Comments)    Other Reaction: OTHER REACTION-IRRITATION TO Esophagous   Celebrex [Celecoxib] Other (See Comments)     esophagitis Esophageal spasms     Ciprofloxacin Other (See Comments)   Esophageal irritation   Ibuprofen     Other reaction(s): Other (See Comments) reflux   Levamisole Other (See Comments) and Nausea And Vomiting   Metronidazole Other (See Comments)    neuropathy   Naproxen Other (See Comments)    GI UPSET   Other Other (See Comments)    Other Reaction: esophagitis    History of Present Illness    [age]/o ? year-old female with above complex past medical history including hypertension, persistent atrial flutter/fib, hyperlipidemia, GERD, nonischemic cardiomyopathy, chronic combined congestive heart failure, thymus cancer, and remote breast cancer.  Cardiac history dates back to April 2017, when she was evaluated with exertional dyspnea.  Stress testing showed an EF of 39% without ischemia.  Follow-up echo showed an EF of 40 to 45% with possible inferior hypokinesis.  She has been managed with beta-blocker and ARB and has more recently required daily Lasix.  In March 2020, she was diagnosed with atrial flutter which was felt to be asymptomatic.  She has been on Eliquis since then.  In late July/early August, she was treated for bronchitis with Augmentin and prednisone taper.  Following that she developed recurrent yeast infections and required multiple rounds of Diflucan.  In late August 2020, she developed right leg pain.  Ultrasound was negative for DVT but she was seen in the ER on September 4 and CT of the abdomen showed subacute vertical fracture of the left sacral alla and subacute  fracture of the right pubic bone.  She thinks that fracture might have occurred when she slipped in the shower in July.  She was also diagnosed with right lower extremity cellulitis at that time and was treated with Keflex and given a dose of Diflucan to take after she completed the course of antibiotics given recent yeast infections.  I saw her on 3 occasions in the fall in the setting of lower extremity swelling requiring titration of Lasix to 40 mg daily.  At her last follow-up  visit October 21, she noted improvement in lower extremity swelling with stable dyspnea on exertion.  Unfortunately, she required ER visit on October 31 in the setting of weakness, fatigue, lightheadedness, dysuria, and soft blood pressures, though blood pressures were normal in the ED.  Labs were unremarkable.  Urinalysis was abnormal and she was placed on a 5-day course of Macrobid and subsequently discharged.  With history of C. difficile, probiotics were encouraged.  In the setting of reports of lower blood pressures, she was also advised to cut back her Lasix to 20 mg daily x3 days.  She held Lasix for a few days but then noticed her weight increased to 151 pounds (baseline 148) and so she resumed 20 mg daily and subsequently resumed 40 mg daily.  Her weight has been hovering around 149-150 pounds over the past week or so.  She has not had any significant dyspnea.  She continues to have mild right greater than left lower extremity swelling and tenderness, dating back to August when she first was diagnosed with cellulitis.  She has been prescribed TED hose but notes that they are so tight that she has trouble putting them on and getting them off.  She denies chest pain, palpitations, PND, orthopnea, dizziness, syncope, or early satiety.  Home Medications    Prior to Admission medications   Medication Sig Start Date End Date Taking? Authorizing Provider  acetaminophen (TYLENOL) 500 MG tablet Take 500 mg by mouth every 4 (four) hours as needed for mild pain or fever.    [provider]  acetaminophen (TYLENOL) 650 MG CR tablet Take 650 mg by mouth 3 (three) times daily.    [provider]  alum & mag hydroxide-simeth (MINTOX) 200-200-20 MG/5ML suspension Take 30 mLs by mouth at bedtime as needed for indigestion or heartburn.    [provider]  apixaban (ELIQUIS) 5 MG TABS tablet Take 1 tablet (5 mg total) by mouth 2 (two) times daily. 07/19/18   Theora Gianotti, NP    carbamide peroxide (GLY-OXIDE) 10 % solution Place 1 application onto teeth 4 (four) times daily -  with meals and at bedtime. (as needed for dry mouth)    [provider]  carvedilol (COREG) 12.5 MG tablet Take 1.5 tablets (18.75 mg total) by mouth 2 (two) times daily. 02/22/18 09/19/27  Theora Gianotti, NP  Cholecalciferol 2000 UNITS TABS Take 2,000 Units by mouth daily.     Roselee Nova, MD  Cyanocobalamin (VITAMIN DEFICIENCY SYSTEM-B12) 1000 MCG/ML KIT Inject 1,000 mcg as directed every 30 (thirty) days.     [provider]  diclofenac sodium (VOLTAREN) 1 % GEL Apply 2 g topically 2 (two) times daily as needed (knee pain).    [provider]  docusate sodium (COLACE) 100 MG capsule Take 100 mg by mouth at bedtime.     [provider]  fluticasone (FLONASE) 50 MCG/ACT nasal spray Place 1 spray into both nostrils daily. 08/30/17  Wilhelmina Mcardle, MD  Fluticasone Furoate (ARNUITY ELLIPTA) 100 MCG/ACT AEPB Inhale 1 puff into the lungs daily. 08/30/17   Wilhelmina Mcardle, MD  furosemide (LASIX) 20 MG tablet Take 2 tablets (40MG) by mouth daily. Patient taking differently: Take 20 mg by mouth 2 (two) times daily. Take 2 tablets (40MG) by mouth daily. 02/13/19   Theora Gianotti, NP  guaiFENesin (MUCINEX) 600 MG 12 hr tablet Take 600 mg by mouth 2 (two) times daily as needed for cough or to loosen phlegm.     [provider]  guaiFENesin (ROBITUSSIN) 100 MG/5ML SOLN Take 10 mLs by mouth every 6 (six) hours as needed for cough or to loosen phlegm.    [provider]  lidocaine (LIDODERM) 5 % Place 1 patch onto the skin daily. Remove & Discard patch within 12 hours or as directed by MD    [provider]  Lidocaine 4 % PTCH Apply 1 patch topically at bedtime.    [provider]  loperamide (IMODIUM A-D) 2 MG tablet Take 2 mg by mouth as needed for diarrhea or loose stools (max 4 doses daily).     [provider]  LORazepam (ATIVAN) 0.5 MG tablet Take 1 tablet (0.5 mg total) by mouth 2 (two) times daily as needed for anxiety. Patient taking differently: Take 0.5 mg by mouth 2 (two) times daily.  05/23/17   Roselee Nova, MD  losartan (COZAAR) 100 MG tablet Take 1 tablet (100 mg total) by mouth daily. 05/23/17   Roselee Nova, MD  lovastatin (MEVACOR) 40 MG tablet Take 1 tablet (40 mg total) by mouth daily. Patient taking differently: Take 40 mg by mouth every Monday, Wednesday, and Friday.  05/23/17   Roselee Nova, MD  Lutein 10 MG TABS Take 10 mg by mouth 2 (two) times daily.    [provider]  magnesium hydroxide (MILK OF MAGNESIA) 400 MG/5ML suspension Take 30 mLs by mouth at bedtime as needed for mild constipation.    [provider]  mirabegron ER (MYRBETRIQ) 25 MG TB24 tablet Take 25 mg by mouth every evening.    [provider]  neomycin-bacitracin-polymyxin (NEOSPORIN) [phone] ointment Apply topically 4 (four) times daily as needed (wound care).    [provider]  polyethylene glycol (MIRALAX / GLYCOLAX) 17 g packet Take 17 g by mouth daily.     [provider]  Saccharomyces boulardii (PROBIOTIC) 250 MG CAPS Take 1 capsule by mouth daily.     [provider]  traMADol (ULTRAM) 50 MG tablet Take 50 mg by mouth daily.     [provider]  traMADol (ULTRAM) 50 MG tablet Take 50 mg by mouth every 8 (eight) hours as needed for moderate pain.    [provider]  trimethoprim (TRIMPEX) 100 MG tablet Take 1 tablet (100 mg total) by mouth daily. 03/04/18   Bjorn Loser, MD    Review of Systems    Some degree of chronic dyspnea on exertion though this has been stable.  Mild right lower extremity swelling which has been stable.  Weight is up slightly.  She denies chest pain, palpitations, PND, orthopnea, dizziness, syncope, or early satiety.  All other systems reviewed and are otherwise negative except as  noted above.  Physical Exam    VS:  BP 128/68 (BP Location: Left Arm, Patient Position: Sitting, Cuff Size: Normal)    Pulse 69    Ht '5\' 6"'  (1.676 m)  Wt 154 lb 12 oz (70.2 kg)    BMI 24.98 kg/m  , BMI Body mass index is 24.98 kg/m. GEN: Well nourished, well developed, in no acute distress. HEENT: normal. Neck: Supple, no JVD, carotid bruits, or masses. Cardiac: Irregularly irregular with a 2/6 systolic murmur at the upper sternal border, no rubs, or gallops. No clubbing, cyanosis.  The right lower leg is larger than the left this is stable dating back to prior visits.  She has trace right lower leg edema.  Radials/PT 2+ and equal bilaterally.  Respiratory:  Respirations regular and unlabored, clear to auscultation bilaterally. GI: Soft, nontender, nondistended, BS + x 4. MS: no deformity or atrophy. Skin: warm and dry, no rash. Neuro:  Strength and sensation are intact. Psych: Normal affect.  Accessory Clinical Findings    ECG personally reviewed by me today -atrial fibrillation, 56, left axis deviation, inferior infarct-similar to February 13, 2019 ECG.  Lab Results  Component Value Date   WBC 7.3 03/15/2019   HGB 14.3 03/15/2019   HCT 45.4 03/15/2019   MCV 91.9 03/15/2019   PLT 151 03/15/2019   Lab Results  Component Value Date   CREATININE 0.87 03/15/2019   BUN 16 03/15/2019   NA 139 03/15/2019   K 4.7 03/15/2019   CL 105 03/15/2019   CO2 25 03/15/2019   Lab Results  Component Value Date   ALT 7 03/15/2019   AST 23 03/15/2019   ALKPHOS 62 03/15/2019   BILITOT 1.0 03/15/2019   Lab Results  Component Value Date   CHOL 139 05/24/2017   HDL 48 (L) 05/24/2017   LDLCALC 67 05/24/2017   TRIG 163 (H) 05/24/2017   CHOLHDL 2.9 05/24/2017     Assessment & Plan    1.  Chronic combined systolic and diastolic congestive heart failure/nonischemic cardiomyopathy: EF 60-65% by echocardiogram in July of this year.  Patient has been stable over the past month.  She weighs  herself daily and her weight has been trending about 2 to 3 pounds above prior baseline though this has occurred after cutting back on Lasix earlier this month.  She is now back on 40 mg daily.  At recent ER visit, renal function and electrolytes were within normal limits.  She does have slight right lower extremity swelling in the right lower extremity is larger than the left following cellulitis in the late summer.  She does have TED hose however, she feels that they are too tight around her calf and she does not have the strength to get them on and off.  In that setting, I recommended that she purchase knee-high socks as something will likely be better than nothing.  I also encouraged her to keep her right leg elevated when sitting.  Her heart rate and blood pressure are well controlled today and she remains on beta-blocker and losartan.  2.  Persistent atrial fibrillation/flutter: She remains in rate controlled atrial fibrillation on beta-blocker therapy.  She is anticoagulated with Eliquis and H&H were stable on October 31.  Given relative lack of symptoms and normal EF by echo in July, we will plan for ongoing rate control.  3.  Essential hypertension: Stable on beta-blocker, ARB, and diuretic.  4.  Hyperlipidemia: She remains on statin therapy with an LDL of 67 last January.  5.  Right lower extremity cellulitis: She continues to have increased girth of the right lower leg compared to the left with trace lower extremity edema.  She was prescribed TED hose however,  these are too tight for her to get on and off.  As above, I did recommend keeping her leg elevated when sitting and also considering wearing a knee-high sock if TED hose were not going to be an option.  6.  Disposition: Follow-up in clinic in 3 months or sooner if necessary.  Murray Hodgkins, NP 04/09/2019, 12:16 PM

## 2019-04-09 NOTE — Patient Instructions (Addendum)
Medication Instructions:  - Your physician recommends that you continue on your current medications as directed. Please refer to the Current Medication list given to you today.  *If you need a refill on your cardiac medications before your next appointment, please call your pharmacy*  Lab Work: - none ordered  If you have labs (blood work) drawn today and your tests are completely normal, you will receive your results only by: Marland Kitchen MyChart Message (if you have MyChart) OR . A paper copy in the mail If you have any lab test that is abnormal or we need to change your treatment, we will call you to review the results.  Testing/Procedures: - none ordered  Follow-Up: At [facility], you and your health needs are our priority.  As part of our continuing mission to provide you with exceptional heart care, we have created designated Provider Care Teams.  These Care Teams include your primary Cardiologist (physician) and Advanced Practice Providers (APPs -  Physician Assistants and Nurse Practitioners) who all work together to provide you with the care you need, when you need it.  Your next appointment:   3 month(s)  The format for your next appointment:   In Person  Provider:   Kathlyn Sacramento, MD  Other Instructions n/a

## 2019-04-14 NOTE — Progress Notes (Signed)
04/15/2019 11:53 AM   Jodi Bullock [birth year] [id number]  Reason for visit: Follow up OAB  HPI: [name] is a 85 year old female with OAB and vaginal atrophy who presents today for a 3 month follow up.  OAB The patient is  experiencing urgency x taking 40 mg Lasix, frequency x 8 or more, not restricting fluids to avoid visits to the restroom, is engaging in toilet mapping, incontinence x 0-3 and nocturia x 0-3.   Her BP is 175/72.   Her PVR is 0 mL.  Failed anticholinergics, Myrbetriq and PTNS   Vaginal atrophy She is not applying vaginal estrogen cream as she has remote history of breast cancer and is fearful.    Today, she states that when she urinates her urine is hot.  She states that even burns her hands.  She is getting up at night to urinate and is having urge incontinence.  She has failed OAB medication and PTNS.  She states that she has a burning sensation in her suprapubic area that wakes her from sleep.  Patient denies any gross hematuria or flank pain.  Patient denies any fevers, chills, nausea or vomiting.   Her UA is positive for 3-10 RBC's.    ROS: Please see flowsheet from today's date for complete review of systems.  Physical Exam: BP (!) 175/72   Pulse 70   Ht 5\' 6"  (1.676 m)   Wt 159 lb (72.1 kg)   BMI 25.66 kg/m   Constitutional:  Well nourished. Alert and oriented, No acute distress. HEENT: [place] AT, moist mucus membranes.  Trachea midline, no masses. Cardiovascular: No clubbing, cyanosis, or edema. Respiratory: Normal respiratory effort, no increased work of breathing. GI: Abdomen is soft, non tender, non distended, no abdominal masses. Liver and spleen not palpable.  No hernias appreciated.  Stool sample for occult testing is not indicated.   GU: No CVA tenderness.  No bladder fullness or masses.  Atrophic, erythematous external genitalia, sparse pubic hair distribution, no lesions.  Normal urethral meatus, no lesions, no prolapse, no discharge.   No  urethral masses, tenderness and/or tenderness. No bladder fullness, tenderness or masses. Pale vagina mucosa, poor estrogen effect, no discharge, no lesions, poor pelvic support, grade II cystocele and no rectocele noted.  Cervix, uterus and adnexa are surgically absent skin: No rashes, bruises or suspicious lesions. Lymph: No inguinal adenopathy. Neurologic: Grossly intact, no focal deficits, moving all 4 extremities. Psychiatric: Normal mood and affect.   Assessment & Plan:    Urinalysis Component     Latest Ref Rng & Units 04/15/2019  Specific Gravity, UA     1.005 - 1.030 1.025  pH, UA     5.0 - 7.5 5.5  Color, UA     Yellow Yellow  Appearance Ur     Clear Hazy (A)  Leukocytes,UA     Negative Trace (A)  Protein,UA     Negative/Trace Negative  Glucose, UA     Negative Negative  Ketones, UA     Negative Negative  RBC, UA     Negative 1+ (A)  Bilirubin, UA     Negative Negative  Urobilinogen, Ur     0.2 - 1.0 mg/dL 1.0  Nitrite, UA     Negative Negative  Microscopic Examination      See below:   Component     Latest Ref Rng & Units 04/15/2019  WBC, UA     0 - 5 /hpf 0-5  RBC  0 - 2 /hpf 3-10 (A)  Epithelial Cells (non renal)     0 - 10 /hpf 0-10  Bacteria, UA     None seen/Few Few   I have reviewed the labs.  1. Microscopic hematuria UA is positive for 3-10 RBCs per high-power field Will send for culture to rule out infection, if culture is negative we will need to pursue hematuria work-up  2. Bladder pain Patient had intense bladder pain last evening that woke her from sleep twice Urine culture is pending and if urine culture is negative we will pursue hematuria work-up which will include cystoscopy  3. Vaginal atrophy Patient was given a sample of vaginal estrogen cream (Premarin vaginal cream) and instructed to apply 0.5mg  (pea-sized amount)  just inside the vaginal introitus with a finger-tip on Monday, Wednesday and Friday nights.  I explained to the  patient that vaginally administered estrogen, which causes only a slight increase in the blood estrogen levels, have fewer contraindications and adverse systemic effects that oral HT. I did explain that this will take several months to take effect in the vaginal lining She will follow up in three months for an exam.     Zara Council, [facility] ([facility]  [facility] [address], [place], [place] [address] [phone]

## 2019-04-15 ENCOUNTER — Ambulatory Visit (INDEPENDENT_AMBULATORY_CARE_PROVIDER_SITE_OTHER): Payer: Medicare Other | Admitting: Urology

## 2019-04-15 ENCOUNTER — Encounter: Payer: Self-pay | Admitting: Urology

## 2019-04-15 ENCOUNTER — Other Ambulatory Visit: Payer: Self-pay | Admitting: Family Medicine

## 2019-04-15 ENCOUNTER — Other Ambulatory Visit: Payer: Self-pay

## 2019-04-15 VITALS — BP 175/72 | HR 70 | Ht 66.0 in | Wt 159.0 lb

## 2019-04-15 DIAGNOSIS — R3129 Other microscopic hematuria: Secondary | ICD-10-CM | POA: Diagnosis not present

## 2019-04-15 DIAGNOSIS — N952 Postmenopausal atrophic vaginitis: Secondary | ICD-10-CM | POA: Diagnosis not present

## 2019-04-15 DIAGNOSIS — R3989 Other symptoms and signs involving the genitourinary system: Secondary | ICD-10-CM

## 2019-04-15 LAB — URINALYSIS, COMPLETE
Bilirubin, UA: NEGATIVE
Glucose, UA: NEGATIVE
Ketones, UA: NEGATIVE
Nitrite, UA: NEGATIVE
Protein,UA: NEGATIVE
Specific Gravity, UA: 1.025 (ref 1.005–1.030)
Urobilinogen, Ur: 1 mg/dL (ref 0.2–1.0)
pH, UA: 5.5 (ref 5.0–7.5)

## 2019-04-15 LAB — MICROSCOPIC EXAMINATION

## 2019-04-15 LAB — BLADDER SCAN AMB NON-IMAGING: Scan Result: 0

## 2019-04-15 MED ORDER — PREMARIN 0.625 MG/GM VA CREA: TOPICAL_CREAM | VAGINAL | 12 refills | Status: DC

## 2019-04-18 ENCOUNTER — Telehealth: Payer: Self-pay | Admitting: Urology

## 2019-04-18 NOTE — Telephone Encounter (Signed)
Pt called asking about her culture results.  I didn't see anything in her chart, except preliminary results.

## 2019-04-19 LAB — CULTURE, URINE COMPREHENSIVE

## 2019-04-21 ENCOUNTER — Other Ambulatory Visit: Payer: Self-pay | Admitting: Urology

## 2019-04-21 DIAGNOSIS — R3129 Other microscopic hematuria: Secondary | ICD-10-CM

## 2019-04-21 NOTE — Telephone Encounter (Signed)
Larene Beach spoke to patient in regards to the urine culture.

## 2019-05-12 ENCOUNTER — Ambulatory Visit
Admission: RE | Admit: 2019-05-12 | Discharge: 2019-05-12 | Disposition: A | Discharge status: Home / Self Care | Payer: Medicare Other | Source: Ambulatory Visit | Attending: Urology | Admitting: Urology

## 2019-05-12 ENCOUNTER — Other Ambulatory Visit: Payer: Self-pay

## 2019-05-12 ENCOUNTER — Ambulatory Visit: Admission: RE | Admit: 2019-05-12 | Payer: Medicare Other | Source: Ambulatory Visit

## 2019-05-12 DIAGNOSIS — R3129 Other microscopic hematuria: Secondary | ICD-10-CM | POA: Diagnosis present

## 2019-05-12 LAB — POCT I-STAT CREATININE: Creatinine, Ser: 1 mg/dL (ref 0.44–1.00)

## 2019-05-12 MED ORDER — IOHEXOL 300 MG/ML  SOLN
125.0000 mL | Freq: Once | INTRAMUSCULAR | Status: AC | PRN
  Administered 2019-05-12: 125 mL via INTRAVENOUS

## 2019-05-13 ENCOUNTER — Telehealth: Payer: Self-pay | Admitting: Urology

## 2019-05-13 NOTE — Telephone Encounter (Signed)
I called pt to schedule Cysto and she requests a call back with her CT results ASAP. Appt made with [facility] for Cysto.

## 2019-05-13 NOTE — Telephone Encounter (Signed)
Mrs. Kucharczyk needs to have a cystoscopy to complete her hematuria workup.  She has seen Dr. Diamantina Providence, Dr. Matilde Sprang and Dr. Junious Silk in the past, so it can be scheduled with any of these physicians.

## 2019-05-14 NOTE — Telephone Encounter (Signed)
Jodi Bullock CT results are available to her MyChart and I also left a note.

## 2019-05-14 NOTE — Telephone Encounter (Signed)
Spoke to patient and read the results. Patient has made the Cysto appointment and voiced understanding.

## 2019-05-21 ENCOUNTER — Ambulatory Visit (INDEPENDENT_AMBULATORY_CARE_PROVIDER_SITE_OTHER): Payer: Medicare Other | Admitting: Urology

## 2019-05-21 ENCOUNTER — Encounter: Payer: Self-pay | Admitting: Urology

## 2019-05-21 ENCOUNTER — Other Ambulatory Visit: Payer: Self-pay

## 2019-05-21 VITALS — BP 120/74 | HR 68 | Ht 60.0 in | Wt 159.0 lb

## 2019-05-21 DIAGNOSIS — R3129 Other microscopic hematuria: Secondary | ICD-10-CM | POA: Diagnosis not present

## 2019-05-21 MED ORDER — MIRABEGRON ER 50 MG PO TB24: 50.0000 mg | ORAL_TABLET | Freq: Every day | ORAL | 11 refills | Status: DC

## 2019-05-21 NOTE — Progress Notes (Signed)
Cystoscopy Procedure Note:  Indication: Microscopic hematuria, dysuria  After informed consent and discussion of the procedure and its risks, Syrita Radell was positioned and prepped in the standard fashion. Cystoscopy was performed with a flexible cystoscope. The urethra, bladder neck and entire bladder was visualized in a standard fashion.  Bladder was grossly normal throughout with no abnormal findings.  The ureteral orifices were visualized in their normal location and orientation.  No abnormalities on retroflexion  Imaging: No abnormalities on CT urogram  Urinalysis today completely benign, 0-5 WBCs, 0 RBCs, no bacteria, nitrite negative, no leukocytes  Findings: Normal cystoscopy  Assessment and Plan: No abnormal findings on hematuria work-up Recommended resuming Myrbetriq for OAB symptoms Discussed at length that we do not have a good explanation for her dysuria, could consider Uribel in the future if worsening dysuria of unclear etiology RTC with PA in 6 months for symptom check  Nickolas Madrid, MD 05/21/2019

## 2019-05-21 NOTE — Addendum Note (Signed)
Addended by: Chrystie Nose on: 05/21/2019 11:33 AM   Modules accepted: Orders

## 2019-05-22 LAB — URINALYSIS, COMPLETE
Bilirubin, UA: NEGATIVE
Glucose, UA: NEGATIVE
Ketones, UA: NEGATIVE
Leukocytes,UA: NEGATIVE
Nitrite, UA: NEGATIVE
RBC, UA: NEGATIVE
Specific Gravity, UA: 1.025 (ref 1.005–1.030)
Urobilinogen, Ur: 1 mg/dL (ref 0.2–1.0)
pH, UA: 7 (ref 5.0–7.5)

## 2019-05-22 LAB — MICROSCOPIC EXAMINATION: RBC, Urine: NONE SEEN /hpf (ref 0–2)

## 2019-06-30 ENCOUNTER — Telehealth: Payer: Self-pay | Admitting: Nurse Practitioner

## 2019-06-30 NOTE — Telephone Encounter (Signed)
Please call patient to discuss Lasix. States she is going to start on a antibiotic for bronchitis as of today.

## 2019-07-01 NOTE — Telephone Encounter (Signed)
Call to patient to further discuss lasix question.   Pt reports she has not been taking lasix as ordered since 2/3. She was tested positive for Covid 2/3 (along with her husband). She has not been taking lasix as ordered for this reason. She does not have clear reasoning for when she takes it. I advised her to take it as ordered.   Pt reports cough and spitting up grey phlegm which she attributes to chronic bronchitis. Pt is starting on z pac today. Wet sounding cough heard on phone. Pt speaking in full sentences with no audible distress noted. No fevers. She denied any covid assoc sx as she feels chronic bronchitis is not related.  Weight today is 144 lbs, she reports decrease is d/t not having low salt options on her tray.  BP 2/13 185/80, HR 68 2/15 158/67, HR 66  Made call to [employer]) who informed me that Jodi Bullock is still in quarantine d/e cough and although it may be related to chronic condition she still has to be 3 days w/o sx before quarantine is discontinued.  I discussed weight loss with her and meal tray complaints. She reported that all trays are low salt/no salt added but they only have a standard tray, no patient specific dietary options available at this level of care. She felt that weight decrease may be related to depression with Jodi Bullock absence. He is due to come back from rehab in a week or two.   I advised at this time Jodi Bullock should take medication as ordered and I will check with Jodi Bayley, NP to see what his recommendations are.  Pt currently has appt with Jodi Bullock scheduled for 2/25.

## 2019-07-01 NOTE — Telephone Encounter (Signed)
I'm sorry to hear that Jodi Bullock has also been impacted by COVID.  With weight loss, and likely reduced appetite in the setting of COVID, continuing to hold lasix is appropriate.  She was taking 40mg  daily previously.  Hopefully, appetite will improve and weight will pick up some.  As such, using weight gain as a determinant for when to resume lasix, won't be very useful.  With ongoing cough and recovery from COVID, shortness of breath may also be difficult to tease out. I recommend that she take lasix 40mg  daily, as needed, for any increase in lower extremity swelling.   We can re-evaluate diuretic needs at next follow-up.

## 2019-07-01 NOTE — Telephone Encounter (Signed)
Call to patient to discuss POC from Ignacia Bayley, NP.   Pt verbalized understanding and in agreement with POC.   Advised pt to call for any further questions or concerns.

## 2019-07-03 ENCOUNTER — Telehealth: Payer: Self-pay | Admitting: Cardiovascular Disease

## 2019-07-03 MED ORDER — APIXABAN 5 MG PO TABS: 5.0000 mg | ORAL_TABLET | Freq: Two times a day (BID) | ORAL | 1 refills | Status: DC

## 2019-07-03 NOTE — Telephone Encounter (Signed)
°*  STAT* If patient is at the pharmacy, call can be transferred to refill team.   1. Which medications need to be refilled? (please list name of each medication and dose if known) Eliquis 5 mg bid  2. Which pharmacy/location (including street and city if local pharmacy) is medication to be sent to? [facility] (by [address])  3. Do they need a 30 day or 90 day supply? 90  Patient will be out tomorrow

## 2019-07-03 NOTE — Telephone Encounter (Signed)
Please review for Eliquis refill.   Patient will be out tomorrow

## 2019-07-03 NOTE — Telephone Encounter (Signed)
89 F, 72,1 kg, SCr 1.0 (12/20), LOV 11/20 Sharolyn Douglas Fletcher Anon)

## 2019-07-10 ENCOUNTER — Ambulatory Visit: Payer: Medicare Other | Admitting: Cardiovascular Disease

## 2019-07-11 ENCOUNTER — Emergency Department: Payer: Medicare Other

## 2019-07-11 ENCOUNTER — Emergency Department
Admission: EM | Admit: 2019-07-11 | Discharge: 2019-07-12 | Disposition: A | Discharge status: Home / Self Care | Payer: Medicare Other | Attending: Student | Admitting: Student

## 2019-07-11 ENCOUNTER — Other Ambulatory Visit: Payer: Self-pay

## 2019-07-11 DIAGNOSIS — S8992XA Unspecified injury of left lower leg, initial encounter: Secondary | ICD-10-CM | POA: Insufficient documentation

## 2019-07-11 DIAGNOSIS — Y929 Unspecified place or not applicable: Secondary | ICD-10-CM | POA: Insufficient documentation

## 2019-07-11 DIAGNOSIS — Z79899 Other long term (current) drug therapy: Secondary | ICD-10-CM | POA: Insufficient documentation

## 2019-07-11 DIAGNOSIS — W010XXA Fall on same level from slipping, tripping and stumbling without subsequent striking against object, initial encounter: Secondary | ICD-10-CM | POA: Diagnosis not present

## 2019-07-11 DIAGNOSIS — Y998 Other external cause status: Secondary | ICD-10-CM | POA: Insufficient documentation

## 2019-07-11 DIAGNOSIS — S79922A Unspecified injury of left thigh, initial encounter: Secondary | ICD-10-CM | POA: Diagnosis present

## 2019-07-11 DIAGNOSIS — Z87891 Personal history of nicotine dependence: Secondary | ICD-10-CM | POA: Diagnosis not present

## 2019-07-11 DIAGNOSIS — Z853 Personal history of malignant neoplasm of breast: Secondary | ICD-10-CM | POA: Diagnosis not present

## 2019-07-11 DIAGNOSIS — Y9389 Activity, other specified: Secondary | ICD-10-CM | POA: Insufficient documentation

## 2019-07-11 DIAGNOSIS — I5042 Chronic combined systolic (congestive) and diastolic (congestive) heart failure: Secondary | ICD-10-CM | POA: Insufficient documentation

## 2019-07-11 DIAGNOSIS — I110 Hypertensive heart disease with heart failure: Secondary | ICD-10-CM | POA: Diagnosis not present

## 2019-07-11 DIAGNOSIS — Z85238 Personal history of other malignant neoplasm of thymus: Secondary | ICD-10-CM | POA: Insufficient documentation

## 2019-07-11 DIAGNOSIS — Z7901 Long term (current) use of anticoagulants: Secondary | ICD-10-CM | POA: Insufficient documentation

## 2019-07-11 DIAGNOSIS — R52 Pain, unspecified: Secondary | ICD-10-CM

## 2019-07-11 DIAGNOSIS — W19XXXA Unspecified fall, initial encounter: Secondary | ICD-10-CM

## 2019-07-11 NOTE — Discharge Instructions (Addendum)
Thank you for letting us take care of you in the emergency department today.   Your x-rays did not show any broken bones or fractures.  Please follow up with: - Your primary care doctor to review your ER visit and follow up on your symptoms.   Please return to the ER for any new or worsening symptoms.

## 2019-07-11 NOTE — ED Notes (Signed)
Pt updated on discharge process. Pt verbalizes understanding.

## 2019-07-11 NOTE — ED Triage Notes (Signed)
Pt from [facility], covid positive with fall today. Pt states she "sat down" injuring left thigh. Pt denies loc, head injury. Pt with bruising noted to left mid lateral thigh.

## 2019-07-11 NOTE — ED Provider Notes (Signed)
[facility] Emergency Department Provider Note  ____________________________________________   First MD Initiated Contact with Patient 07/11/19 2101     (approximate)  I have reviewed the triage vital signs and the nursing notes.  History  Chief Complaint Fall    HPI Jodi Bullock is a 86 y.o. female PMHx as below who presents to the ER for a fall with resultant left lateral thigh pain. Fall occurred earlier today. She states her husband actually fell, bumping into her, which caused her to fall. She denies any true "fall" and states she rather just "sat down" a little more forcefully on the ground. No head injury or LOC. Initially felt fine and was ambulatory afterwards.  This evening she developed some bruising, which she attributed to her anticoagulation (Eliquis) with some associated soreness, which prompted her to seek care.  She denies any other injuries elsewhere.  Describes pain as a soreness, located to the left lateral thigh.  2/10 in severity.  No radiation.  No alleviating or aggravating components.  Last took her Eliquis this evening.   Past Medical Hx Past Medical History:  Diagnosis Date  . Arthritis   . Atrial flutter ([place])    a. Dx 07/2018. CHA2DS2VASc = 5-->Eliquis.  . Bladder incontinence   . Breast cancer ([place]) 1998  . Chronic combined systolic and diastolic CHF (congestive heart failure) ([place]) 01/10/2016   a. 09/2015 Echo: EF 40-45%, possible inf HK, mod PAH (PASP 47mHg); b. 11/2018 Echo: EF 60-65%, RVSP 665mg. Mod dil LA, mildly dil RA. Mod MR.  . Closed fracture of right femur ([place]   a. 08/2017->Managed conservatively.  . Depression   . GERD (gastroesophageal reflux disease)   . History of kidney stones   . HLD (hyperlipidemia)   . Hypertension   . Neuropathy of both feet   . NICM (nonischemic cardiomyopathy) ([place]   a. 08/2015 Lexiscan MV: EF 39%, no ischemia; b. 09/2015 Echo: EF 40-45%.  . Persistent atrial fibrillation ([place]    . Right Humerus head fracture    a. 07/2016 - managed conservatively.  . 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   [facility]    Problem List Patient Active Problem List   Diagnosis Date Noted  . Microscopic hematuria 05/21/2019  . Femur fracture (HCCairo04/28/2019  . Abnormal gait 12/19/2016  . Acquired spondylolisthesis 12/19/2016  . Breast lump 12/19/2016  . Cervical spondylosis without myelopathy 12/19/2016  . Closed Colles' fracture 12/19/2016  . Closed fracture of base of neck of femur (HCLuc[place]08/11/2016  . Closed fracture of lower end of radius and ulna 12/19/2016  . Closed fracture of neck of femur (HCPope08/11/2016  . Spondylolisthesis, congenital 12/19/2016  . Contracture of joint of hand 12/19/2016  . Contracture of wrist joint 12/19/2016  . Contusion of hand 12/19/2016  . Contusion of hip 12/19/2016  . Contusion of knee 12/19/2016  . Degeneration of intervertebral disc at C4-C5 level 12/19/2016  . Degeneration of lumbar intervertebral disc 12/19/2016  . Disorder of coccyx 12/19/2016  . Dry eyes 12/19/2016  . Enthesopathy 12/19/2016  . Enthesopathy of hip region 12/19/2016  . Hand joint pain 12/19/2016  . Hand joint stiff 12/19/2016  . Hip pain 12/19/2016  . Joint pain 12/19/2016  . Knee pain 12/19/2016  . Localized, primary osteoarthritis 12/19/2016  . Loose body in hip joint 12/19/2016  . Lumbar sprain 12/19/2016  . Abnormal mammogram 12/19/2016  . Meibomian gland dysfunction 12/19/2016  . Muscle weakness 12/19/2016  . Open fracture of  lower end of forearm 12/19/2016  . Presbyopia 12/19/2016  . Regular astigmatism 12/19/2016  . Shoulder joint pain 12/19/2016  . Skin sensation disturbance 12/19/2016  . Sprain of ankle 12/19/2016  . Trochanteric bursitis 12/19/2016  . Wrist joint pain 12/19/2016  . Stiffness of wrist joint 12/19/2016  . Acute bilateral low back pain without sciatica 09/29/2016  . Acute bronchitis 06/19/2016  . Recurrent productive cough 06/14/2016  . Medicare  annual wellness visit, subsequent 05/17/2016  . Fall in elderly patient 01/27/2016  . Sprain of wrist, right 01/26/2016  . Systolic CHF with reduced left ventricular function, NYHA class 3 ([place]) 01/10/2016  . Productive cough 11/19/2015  . Chronic systolic heart failure ([place]) 10/12/2015  . Dyspnea on exertion 08/31/2015  . Fatigue 08/31/2015  . 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 Irregular heart rhythm 07/22/2015  . Left arm pain 06/29/2015  . Dyslipidemia 06/03/2015  . Acute recurrent maxillary sinusitis 06/03/2015  . Oral mucosal lesion 05/26/2015  . Urge incontinence 02/14/2015  . Ankle edema 02/02/2015  . At risk for falling 01/25/2015  . Dizziness 01/25/2015  . Acid reflux 01/25/2015  . Big thyroid 01/25/2015  . Gravida 2 para 2 01/25/2015  . Personal history of malignant neoplasm of breast 01/25/2015  . Arthritis, degenerative 01/25/2015  . Parity 2 01/25/2015  . Peripheral blood vessel disorder ([place]) 01/25/2015  . Need for vaccination 01/25/2015  . Pain in rectum 01/25/2015  . Reflux 01/25/2015  . Screening for depression 01/25/2015  . Disease of accessory sinus 01/25/2015  . Headache, temporal 01/25/2015  . Primary cancer of thymus ([place]) 01/25/2015  . Disease of thyroid gland 01/25/2015  . Avitaminosis D 01/25/2015  . Family history of diabetes mellitus in father 12/31/2014  . Fasting hyperglycemia 12/31/2014  . Hypertension 12/01/2014  . HLD (hyperlipidemia) 12/01/2014  . Anxiety 12/01/2014  . Myofascial pain 12/01/2014  . Breast CA ([place]) 11/24/2014  . Absence of bladder continence 11/24/2014  . Urgency of micturation 11/24/2014  . Common bile duct dilatation 10/20/2014  . Pancreatic cyst 10/20/2014  . Kidney stones 10/20/2014  . Neuritis or radiculitis due to rupture of lumbar intervertebral disc 06/15/2014  . Lumbar canal stenosis 06/15/2014  . Degenerative arthritis of lumbar spine 06/15/2014  . Lumbar radiculitis 06/15/2014  . Osteoarthritis of spine  with radiculopathy, lumbar region 06/15/2014  . 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 Bloodgood disease 11/05/2012  . Arthritis of temporomandibular joint 06/24/2012  . Atypical chest pain 01/05/2012  . Breath shortness 12/04/2011  . Lung mass 11/15/2011  . Thyroid nodule 11/15/2011  . Age-related macular degeneration, dry 10/03/2011  . Disorder of peripheral nervous system 10/03/2011  . Chronic rhinitis 03/03/2011  . Carotid artery narrowing 03/01/2011  . Polypharmacy 12/02/2010    Past Surgical Hx Past Surgical History:  Procedure Laterality Date  . ABDOMINAL HYSTERECTOMY    . BREAST LUMPECTOMY    . CATARACT EXTRACTION    . CHOLECYSTECTOMY    . JOINT REPLACEMENT Right    Hip  . KNEE SURGERY Right     Medications Prior to Admission medications   Medication Sig Start Date End Date Taking? Authorizing Provider  acetaminophen (TYLENOL) 500 MG tablet Take 500 mg by mouth every 4 (four) hours as needed for mild pain or fever.    [provider]  acetaminophen (TYLENOL) 650 MG CR tablet Take 650 mg by mouth 3 (three) times daily.    [provider]  alum & mag hydroxide-simeth (MINTOX) 200-200-20 MG/5ML suspension Take 30 mLs  by mouth at bedtime as needed for indigestion or heartburn.    [provider]  apixaban (ELIQUIS) 5 MG TABS tablet Take 1 tablet (5 mg total) by mouth 2 (two) times daily. 07/03/19   Wellington Hampshire, MD  carbamide peroxide (GLY-OXIDE) 10 % solution Place 1 application onto teeth 4 (four) times daily -  with meals and at bedtime. (as needed for dry mouth)    [provider]  carvedilol (COREG) 12.5 MG tablet Take 1.5 tablets (18.75 mg total) by mouth 2 (two) times daily. 02/22/18 09/19/27  Theora Gianotti, NP  Cholecalciferol 2000 UNITS TABS Take 2,000 Units by mouth daily.     Roselee Nova, MD  conjugated estrogens (PREMARIN) vaginal cream Apply 0.51m (pea-sized amount)  just inside the vaginal introitus  with a finger-tip on  Monday, Wednesday and Friday nights. 04/15/19   MZara CouncilA, PA-C  Cyanocobalamin (VITAMIN DEFICIENCY SYSTEM-B12) 1000 MCG/ML KIT Inject 1,000 mcg as directed every 30 (thirty) days.     [provider]  diclofenac sodium (VOLTAREN) 1 % GEL Apply 2 g topically 2 (two) times daily as needed (knee pain).    [provider]  docusate sodium (COLACE) 100 MG capsule Take 100 mg by mouth at bedtime.     [provider]  fluticasone (FLONASE) 50 MCG/ACT nasal spray Place 1 spray into both nostrils daily. 08/30/17   SWilhelmina Mcardle MD  Fluticasone Furoate (ARNUITY ELLIPTA) 100 MCG/ACT AEPB Inhale 1 puff into the lungs daily. 08/30/17   SWilhelmina Mcardle MD  furosemide (LASIX) 20 MG tablet Take 2 tablets (40MG) by mouth daily. Patient taking differently: Take 20 mg by mouth 2 (two) times daily. Take 2 tablets (40MG) by mouth daily. 02/13/19   BTheora Gianotti NP  guaiFENesin (MUCINEX) 600 MG 12 hr tablet Take 600 mg by mouth 2 (two) times daily as needed for cough or to loosen phlegm.     [provider]  guaiFENesin (ROBITUSSIN) 100 MG/5ML SOLN Take 10 mLs by mouth every 6 (six) hours as needed for cough or to loosen phlegm.    [provider]  loperamide (IMODIUM A-D) 2 MG tablet Take 2 mg by mouth as needed for diarrhea or loose stools (max 4 doses daily).     [provider]  LORazepam (ATIVAN) 0.5 MG tablet Take 1 tablet (0.5 mg total) by mouth 2 (two) times daily as needed for anxiety. Patient taking differently: Take 0.5 mg by mouth 2 (two) times daily.  05/23/17   SRoselee Nova MD  losartan (COZAAR) 100 MG tablet Take 1 tablet (100 mg total) by mouth daily. 05/23/17   SRoselee Nova MD  lovastatin (MEVACOR) 40 MG tablet Take 1 tablet (40 mg total) by mouth daily. Patient taking differently: Take 40 mg by mouth every Monday, Wednesday, and Friday.  05/23/17   SRoselee Nova MD  Lutein 10 MG TABS Take 10 mg  by mouth 2 (two) times daily.    [provider]  magnesium hydroxide (MILK OF MAGNESIA) 400 MG/5ML suspension Take 30 mLs by mouth at bedtime as needed for mild constipation.    [provider]  mirabegron ER (MYRBETRIQ) 50 MG TB24 tablet Take 1 tablet (50 mg total) by mouth daily. 05/21/19   SBilley Co MD  neomycin-bacitracin-polymyxin (NEOSPORIN) [phone] ointment Apply topically 4 (four) times daily as needed (wound care).    [provider]  polyethylene glycol (MIRALAX / GLYCOLAX)  17 g packet Take 17 g by mouth daily.     [provider]  Saccharomyces boulardii (PROBIOTIC) 250 MG CAPS Take 1 capsule by mouth daily.     [provider]  traMADol (ULTRAM) 50 MG tablet Take 50 mg by mouth daily.     [provider]  trimethoprim (TRIMPEX) 100 MG tablet Take 1 tablet (100 mg total) by mouth daily. 03/04/18   Bjorn Loser, MD    Allergies 2,4-d dimethylamine (amisol); Celebrex [celecoxib]; Ciprofloxacin; Ibuprofen; Levamisole; Metronidazole; Naproxen; and Other  Family Hx Family History  Problem Relation Age of Onset  . Stroke Mother   . Diabetes Father   . Heart disease Father   . Kidney disease Neg Hx   . Bladder Cancer Neg Hx   . Kidney cancer Neg Hx     Social Hx Social History   Tobacco Use  . Smoking status: Former Research scientist (life sciences)  . Smokeless tobacco: Never Used  . Tobacco comment: smoked in high school for a few years only  Substance Use Topics  . Alcohol use: No    Alcohol/week: 0.0 standard drinks  . Drug use: No     Review of Systems  Constitutional: Negative for fever, chills. Eyes: Negative for visual changes. ENT: Negative for sore throat. Cardiovascular: Negative for chest pain. Respiratory: Negative for shortness of breath. Gastrointestinal: Negative for nausea, vomiting.  Genitourinary: Negative for dysuria. Musculoskeletal: Positive for left thigh pain and ecchymosis. Skin: Positive for  ecchymosis. Neurological: Negative for headaches.   Physical Exam  Vital Signs: ED Triage Vitals  Enc Vitals Group     BP 07/11/19 2101 (!) 170/122     Pulse Rate 07/11/19 2101 68     Resp 07/11/19 2101 14     Temp 07/11/19 2101 97.7 F (36.5 C)     Temp Source 07/11/19 2101 Oral     SpO2 07/11/19 2101 96 %     Weight 07/11/19 2059 144 lb (65.3 kg)     Height 07/11/19 2059 5' 6" (1.676 m)     Head Circumference --      Peak Flow --      Pain Score 07/11/19 2059 2     Pain Loc --      Pain Edu? --      Excl. in [place]? --     Constitutional: Alert and oriented.  Head: Normocephalic. Atraumatic. Eyes: Conjunctivae clear. Sclera anicteric. Nose: No congestion. No rhinorrhea. Mouth/Throat: Wearing mask.  Neck: No stridor.  No midline CS tenderness. FROM w/o discomfort.  Cardiovascular: Normal rate, regular rhythm. Extremities well perfused.  2+ symmetric DP pulses by palpation.  Toes are warm and well perfused. Chest: Chest wall stable, nontender.  No flail chest.  No crepitance. Respiratory: Normal respiratory effort.  Lungs CTAB. Gastrointestinal: Soft. Non-tender. Non-distended.  Pelvis: Stable with AP and lateral compression.  Nontender.  Full range of motion at the hip bilaterally. Musculoskeletal: TTP to the left lateral proximal and mid thigh.  Area of associated ecchymosis and soft hematoma.  Compartments are soft and compressible.  No obvious deformities.  Able to range at bilateral hips, knees (knees somewhat limited due to chronic knee pain, but no acute changes), ankles.  Neurologic:  Normal speech and language. No gross focal neurologic deficits are appreciated.  Skin: Ecchymosis to the left lateral thigh. Psychiatric: Mood and affect are appropriate for situation.   Radiology  XR hip: IMPRESSION:  1. Moderate left hip osteoarthritis. No acute fracture.   XR femur: IMPRESSION:  1. No acute bony abnormality.  2. Left hip and knee arthritis.    Procedures  Procedure(s) performed (including critical care):  Procedures   Initial Impression / Assessment and Plan / ED Course  86 y.o. female who presents to the ED for a fall, with resultant left lateral thigh pain, as above.  Ddx: contusion, hematoma, fracture.  Exam as above, no concern based on exam for compartment syndrome given thigh compartments are soft and compressible, equal and symmetric distal pulses.  N/V intact.  Imaging negative for acute fracture.  As such, patient is stable for discharge.  Advised supportive pain care, given return precautions.     Final Clinical Impression(s) / ED Diagnosis  Final diagnoses:  Fall, initial encounter  Injury of left lower extremity, initial encounter       Note:  This document was prepared using Dragon voice recognition software and may include unintentional dictation errors.   Lilia Pro., MD 07/12/19 [phone]

## 2019-07-14 ENCOUNTER — Telehealth: Payer: Self-pay | Admitting: Nurse Practitioner

## 2019-07-14 NOTE — Telephone Encounter (Signed)
Pt and husband have had issues with worsening edema/heart failure when they did not have access to diuretics in past.  I am ok with [name] providing a request to the facility for the [facility] to continue to have medications in their room.

## 2019-07-14 NOTE — Telephone Encounter (Signed)
Patient calling in stating that all medications prescribed by this office has been taken from patient. Patient resides at [facility] and they are now changing the rules. Patients are apparently now unable to keep their own medications and that they will be managed by [facility] staff. Patient calling in to see if there is anything that Ignacia Bayley or Fletcher Anon can do. Patient is distraught and wanting any help she can get.

## 2019-07-15 NOTE — Telephone Encounter (Signed)
Call to [name]. She reports "they took all the medications out of our room last night and myself and [name] didn't get all of our medications until 11:30P".   She reports that the medications she needs in the room are Eliquis and Lasix. She reports that [name] medications are not at bedside as well since they took them.   I told patient that I will call facility and try to figure out what is going on.   Made call to the [facility], spoke to lead med tech, Sparks.  She reports that a room sweep was performed on [name] room yesterday. Per her report [name] "has been purchasing medications off [employer] and she is not measuring and taking medications correctly". "[name] admitted that she did not take her Lasix for 5 days and her legs are all swollen, she is not taking her medications correctly."   I asked Laqueta Linden if it was possible that [name] did not receive his medication on time and she felt that was not accurate.   Tech feels like it is not safe for patient and [name] to have medications in their room at this time.   Routing to provider to further advise.

## 2019-07-15 NOTE — Telephone Encounter (Signed)
Attempted to call patient. LMTCB 07/15/2019

## 2019-07-15 NOTE — Telephone Encounter (Signed)
Spoke to patient, she reported it was not a good time to speak and would like me to call back later.

## 2019-07-15 NOTE — Telephone Encounter (Signed)
Patient returning call.

## 2019-07-15 NOTE — Telephone Encounter (Signed)
It sounds like a family conference with facility admin and medical staff is warranted.  I'm not sure how much more we have to add, other than that the [facility] have always been very honest and forthcoming to our team, and I definitely noted a difference in Jodi Bullock's volume mgmt, once she was allowed to keep her diuretic in her room.

## 2019-07-16 NOTE — Telephone Encounter (Signed)
Returned call to [employer] to speak with director about possibly having a team meeting regarding medications for Mr and Mrs Gaccione.  Spoke to Mudlogger, [place] Mam. She agreed to team meeting and I gave her my contact information to arrange this.   I made call to Ms. Barclift for update and she reported that she no longer wants to pursue keeping medications in the room for herself and Mr. Bearup after speaking to office staff.   I advised her to call back if she has anything further needed.

## 2019-07-23 ENCOUNTER — Other Ambulatory Visit: Payer: Self-pay

## 2019-07-23 ENCOUNTER — Encounter: Payer: Self-pay | Admitting: Nurse Practitioner

## 2019-07-23 ENCOUNTER — Ambulatory Visit (INDEPENDENT_AMBULATORY_CARE_PROVIDER_SITE_OTHER): Payer: Medicare Other | Admitting: Nurse Practitioner

## 2019-07-23 VITALS — BP 120/50 | HR 63 | Ht 66.0 in | Wt 144.0 lb

## 2019-07-23 DIAGNOSIS — E782 Mixed hyperlipidemia: Secondary | ICD-10-CM | POA: Diagnosis not present

## 2019-07-23 DIAGNOSIS — I10 Essential (primary) hypertension: Secondary | ICD-10-CM

## 2019-07-23 DIAGNOSIS — I5042 Chronic combined systolic (congestive) and diastolic (congestive) heart failure: Secondary | ICD-10-CM | POA: Diagnosis not present

## 2019-07-23 DIAGNOSIS — I4819 Other persistent atrial fibrillation: Secondary | ICD-10-CM | POA: Diagnosis not present

## 2019-07-23 MED ORDER — FUROSEMIDE 20 MG PO TABS: ORAL_TABLET | ORAL | 3 refills | Status: DC

## 2019-07-23 NOTE — Progress Notes (Signed)
Office Visit    Patient Name: Jodi Bullock Date of Encounter: 07/23/2019  Primary Care Provider:  Orvis Brill, [facility] Primary Cardiologist:  Kathlyn Sacramento, MD  Chief Complaint    86 year old female with history of nonischemic cardiomyopathy, chronic combined systolic and diastolic congestive heart failure, hypertension, hyperlipidemia, thymus cancer, right breast cancer, COVID-19 (January 2021), and A. fib/flutter, who presents for follow-up of heart failure.  Past Medical History    Past Medical History:  Diagnosis Date  . Arthritis   . Atrial flutter ([place])    a. Dx 07/2018. CHA2DS2VASc = 5-->Eliquis.  . Bladder incontinence   . Breast cancer ([place]) 1998  . Chronic combined systolic and diastolic CHF (congestive heart failure) ([place]) 01/10/2016   a. 09/2015 Echo: EF 40-45%, possible inf HK, mod PAH (PASP 36mHg); b. 11/2018 Echo: EF 60-65%, RVSP 671mg. Mod dil LA, mildly dil RA. Mod MR.  . Closed fracture of right femur ([facility]   a. 08/2017->Managed conservatively.  . Marland KitchenOVID-19 virus infection    a. 05/2019 - asymptomatic.  . Marland Kitchenepression   . GERD (gastroesophageal reflux disease)   . History of kidney stones   . HLD (hyperlipidemia)   . Hypertension   . Neuropathy of both feet   . NICM (nonischemic cardiomyopathy) ([facility]   a. 08/2015 Lexiscan MV: EF 39%, no ischemia; b. 09/2015 Echo: EF 40-45%.  . Persistent atrial fibrillation ([facility]  . Right Humerus head fracture    a. 07/2016 - managed conservatively.  . 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   [facility]   Past Surgical History:  Procedure Laterality Date  . ABDOMINAL HYSTERECTOMY    . BREAST LUMPECTOMY    . CATARACT EXTRACTION    . CHOLECYSTECTOMY    . JOINT REPLACEMENT Right    Hip  . KNEE SURGERY Right     Allergies  Allergies  Allergen Reactions  . 2,4-D Dimethylamine (Amisol) Other (See Comments)    Other Reaction: OTHER REACTION-IRRITATION TO Esophagous  . Celebrex [Celecoxib] Other (See Comments)      esophagitis Esophageal spasms    . Ciprofloxacin Other (See Comments)    Esophageal irritation  . Ibuprofen Other (See Comments)    Other reaction(s): Other (See Comments) reflux  . Levamisole Other (See Comments) and Nausea And Vomiting  . Metronidazole Other (See Comments)    neuropathy  . Naproxen Other (See Comments)    GI UPSET  . Other Other (See Comments)    Other Reaction: esophagitis    History of Present Illness    [age]ear old female with above complex past medical history including hypertension, persistent atrial flutter/fib, hyperlipidemia, GERD, nonischemic cardiomyopathy, chronic mild congestive heart failure, thymus cancer, and remote breast cancer.  Cardiac history dates back to April 2017, when she was evaluated with exertional dyspnea.  Stress testing showed an EF of 39% without ischemia.  Follow-up echo showed an EF of 40-45% with possible inferior hypokinesis.  She has been managed medically.  In March 2020, she was diagnosed with atrial flutter, which has been persistent and asymptomatic.  She has been on Eliquis since then.  She has had intermittent lower extremity swelling, complicated by right lower extremity cellulitis in the summer 2020 requiring antibiotics.  Lower extremity edema has mostly been well-controlled Lasix 40 mg daily.  She was last seen in clinic in November 2020.  She has done well but was diagnosed with COVID-19 in January.  Where she was asymptomatic and did not require hospitalization, her husband was hospitalized for 4  days and then sent to rehab for 2 weeks.  In late February, she was seen in the emergency department following a fall that occurred after her husband fell and bumped into her.  X-rays were negative for fractures and she was discharged home.  Though she is continued to have bruising of the left lower extremity, she has otherwise been doing reasonably well.  She has not had any chest pain, palpitations, dyspnea, PND, orthopnea, dizziness,  syncope, edema, or early satiety.  She has had a significant amount of stress related to the facility at which she and her husband live taking medications out of the room.  These are now being administered by facility staff only.  [name] says that she has accepted this but it does cause stress.  She hasn't been sleeping well and suspects that she has been receiving lasix prior to bed.  Review of her med list does show that whereas she was previously taking lasix 63m 2 tabs in the AM, it is now being administered as 254mBID.  She would like to take 4032mn the AM only.  Home Medications    Prior to Admission medications   Medication Sig Start Date End Date Taking? Authorizing Provider  acetaminophen (TYLENOL) 500 MG tablet Take 500 mg by mouth every 4 (four) hours as needed for mild pain or fever.    [provider]  acetaminophen (TYLENOL) 650 MG CR tablet Take 650 mg by mouth 3 (three) times daily.    [provider]  alum & mag hydroxide-simeth (MINTOX) 200-200-20 MG/5ML suspension Take 30 mLs by mouth at bedtime as needed for indigestion or heartburn.    [provider]  apixaban (ELIQUIS) 5 MG TABS tablet Take 1 tablet (5 mg total) by mouth 2 (two) times daily. 07/03/19   AriWellington HampshireD  carbamide peroxide (GLY-OXIDE) 10 % solution Place 1 application onto teeth 4 (four) times daily -  with meals and at bedtime. (as needed for dry mouth)    [provider]  carvedilol (COREG) 12.5 MG tablet Take 1.5 tablets (18.75 mg total) by mouth 2 (two) times daily. 02/22/18 09/19/27  BerTheora GianottiP  Cholecalciferol 2000 UNITS TABS Take 2,000 Units by mouth daily.     ShaRoselee NovaD  conjugated estrogens (PREMARIN) vaginal cream Apply 0.5mg28mea-sized amount)  just inside the vaginal introitus with a finger-tip on  Monday, Wednesday and Friday nights. 04/15/19   McGoZara CouncilPA-C  Cyanocobalamin (VITAMIN DEFICIENCY SYSTEM-B12) 1000 MCG/ML  KIT Inject 1,000 mcg as directed every 30 (thirty) days.     [provider]  diclofenac sodium (VOLTAREN) 1 % GEL Apply 2 g topically 2 (two) times daily as needed (knee pain).    [provider]  docusate sodium (COLACE) 100 MG capsule Take 100 mg by mouth at bedtime.     [provider]  fluticasone (FLONASE) 50 MCG/ACT nasal spray Place 1 spray into both nostrils daily. 08/30/17   SimoWilhelmina Mcardle  Fluticasone Furoate (ARNUITY ELLIPTA) 100 MCG/ACT AEPB Inhale 1 puff into the lungs daily. 08/30/17   SimoWilhelmina Mcardle  furosemide (LASIX) 20 MG tablet Take 2 tablets (40MG) by mouth daily. Patient taking differently: Take 20 mg by mouth 2 (two) times daily. Take 2 tablets (40MG) by mouth daily. 02/13/19   BergTheora Gianotti  guaiFENesin (MUCINEX) 600 MG 12 hr tablet Take 600 mg by mouth 2 (two) times daily as needed for  cough or to loosen phlegm.     [provider]  guaiFENesin (ROBITUSSIN) 100 MG/5ML SOLN Take 10 mLs by mouth every 6 (six) hours as needed for cough or to loosen phlegm.    [provider]  loperamide (IMODIUM A-D) 2 MG tablet Take 2 mg by mouth as needed for diarrhea or loose stools (max 4 doses daily).     [provider]  LORazepam (ATIVAN) 0.5 MG tablet Take 1 tablet (0.5 mg total) by mouth 2 (two) times daily as needed for anxiety. Patient taking differently: Take 0.5 mg by mouth 2 (two) times daily.  05/23/17   Roselee Nova, MD  losartan (COZAAR) 100 MG tablet Take 1 tablet (100 mg total) by mouth daily. 05/23/17   Roselee Nova, MD  lovastatin (MEVACOR) 40 MG tablet Take 1 tablet (40 mg total) by mouth daily. Patient taking differently: Take 40 mg by mouth every Monday, Wednesday, and Friday.  05/23/17   Roselee Nova, MD  Lutein 10 MG TABS Take 10 mg by mouth 2 (two) times daily.    [provider]  magnesium hydroxide (MILK OF MAGNESIA) 400 MG/5ML suspension Take 30 mLs by mouth at bedtime  as needed for mild constipation.    [provider]  mirabegron ER (MYRBETRIQ) 50 MG TB24 tablet Take 1 tablet (50 mg total) by mouth daily. 05/21/19   Billey Co, MD  neomycin-bacitracin-polymyxin (NEOSPORIN) [phone] ointment Apply topically 4 (four) times daily as needed (wound care).    [provider]  polyethylene glycol (MIRALAX / GLYCOLAX) 17 g packet Take 17 g by mouth daily.     [provider]  Saccharomyces boulardii (PROBIOTIC) 250 MG CAPS Take 1 capsule by mouth daily.     [provider]  traMADol (ULTRAM) 50 MG tablet Take 50 mg by mouth daily.     [provider]  trimethoprim (TRIMPEX) 100 MG tablet Take 1 tablet (100 mg total) by mouth daily. 03/04/18   Bjorn Loser, MD    Review of Systems    Recent fall w/ left leg bruising and mild discomfort, but overall recovering well.  She denies chest pain, palpitations, dyspnea, pnd, orthopnea, n, v, dizziness, syncope, edema, weight gain, or early satiety.  All other systems reviewed and are otherwise negative except as noted above.  Physical Exam    VS:  BP (!) 120/50 (BP Location: Left Arm, Patient Position: Sitting, Cuff Size: Normal)   Pulse 63   Ht _0  (1.676 m)   Wt 144 lb (65.3 kg)   SpO2 98%   BMI 23.24 kg/m  , BMI Body mass index is 23.24 kg/m. GEN: Well nourished, well developed, in no acute distress. HEENT: normal. Neck: Supple, no JVD, carotid bruits, or masses. Cardiac: IR, IR, no murmurs, rubs, or gallops. No clubbing, cyanosis, edema. The left leg is ecchymotic from calf to thigh. Radials/PT 2+ and equal bilaterally.  Respiratory:  Respirations regular and unlabored, clear to auscultation bilaterally. GI: Soft, nontender, nondistended, BS + x 4. MS: no deformity or atrophy. Skin: warm and dry, no rash. Neuro:  Strength and sensation are intact. Psych: Normal affect.  Accessory Clinical Findings    ECG personally reviewed by me today -atrial  fibrillation, 63, inferior infarct with delayed R wave progression - no acute changes.  Lab Results  Component Value Date   WBC 7.3 03/15/2019   HGB 14.3 03/15/2019   HCT 45.4 03/15/2019   MCV 91.9 03/15/2019  PLT 151 03/15/2019   Lab Results  Component Value Date   CREATININE 1.00 05/12/2019   BUN 16 03/15/2019   NA 139 03/15/2019   K 4.7 03/15/2019   CL 105 03/15/2019   CO2 25 03/15/2019   Lab Results  Component Value Date   ALT 7 03/15/2019   AST 23 03/15/2019   ALKPHOS 62 03/15/2019   BILITOT 1.0 03/15/2019   Lab Results  Component Value Date   CHOL 139 05/24/2017   HDL 48 (L) 05/24/2017   LDLCALC 67 05/24/2017   TRIG 163 (H) 05/24/2017   CHOLHDL 2.9 05/24/2017    Lab Results  Component Value Date   HGBA1C 5.4 02/14/2016    Assessment & Plan    1.  Chronic combined systolic and diastolic chest of heart failure: EF previously low as 40-45% in May 2017 however, she was 60-65% by echocardiogram in July 2020.  Volume has been stable on Lasix 40 mg daily.  Heart rate and blood pressure stable today and she is euvolemic on examination.  It appears, that she has been receiving Lasix 20 mg twice daily, which has resulted in some nocturia and sleep disruption.  We have corrected her medicine list to read 40 mg once a day.  Medications are now being provided by medical staff at [facility].  Follow-up basic metabolic panel today.  2.  Persistent atrial fibrillation: Originally diagnosed in the spring 2020, she remains in atrial fibrillation at this point, it appears to be permanent.  She is asymptomatic and rate controlled.  She remains on beta-blocker and Eliquis therapy.  3.  Essential hypertension: Stable on beta-blocker, ARB, diuretic.  4.  Hyperlipidemia: She remains on statin therapy with an LDL of 67 in January 2019.  This will need to be followed up at some point in the future when she is fasting.  5.  COVID-19 infection: Patient was diagnosed about 7 to 10 days  after receiving her first COVID-19 vaccination.  Her husband was also diagnosed and he required hospitalization however, Mrs. Vaquera was asymptomatic.  No sequelae.  6.  Disposition: Follow-up basic metabolic panel today.  Follow-up in clinic in 3 months or sooner if necessary.  Murray Hodgkins, NP 07/23/2019, 12:13 PM

## 2019-07-23 NOTE — Patient Instructions (Signed)
Medication Instructions:  1- CHANGE Lasix to Take 2 tablets (40MG ) by mouth daily in the MORNING. *If you need a refill on your cardiac medications before your next appointment, please call your pharmacy*   Lab Work: Your physician recommends that you have lab work today(BMET)  If you have labs (blood work) drawn today and your tests are completely normal, you will receive your results only by: Marland Kitchen MyChart Message (if you have MyChart) OR . A paper copy in the mail If you have any lab test that is abnormal or we need to change your treatment, we will call you to review the results.   Testing/Procedures: None ordered    Follow-Up: At [facility], you and your health needs are our priority.  As part of our continuing mission to provide you with exceptional heart care, we have created designated Provider Care Teams.  These Care Teams include your primary Cardiologist (physician) and Advanced Practice Providers (APPs -  Physician Assistants and Nurse Practitioners) who all work together to provide you with the care you need, when you need it.  We recommend signing up for the patient portal called "MyChart".  Sign up information is provided on this After Visit Summary.  MyChart is used to connect with patients for Virtual Visits (Telemedicine).  Patients are able to view lab/test results, encounter notes, upcoming appointments, etc.  Non-urgent messages can be sent to your provider as well.   To learn more about what you can do with MyChart, go to [email or web address].    Your next appointment:   3 month(s)  The format for your next appointment:   In Person  Provider:    You may see Kathlyn Sacramento, MD or Murray Hodgkins, NP.

## 2019-07-24 LAB — BASIC METABOLIC PANEL
BUN/Creatinine Ratio: 20 (ref 12–28)
BUN: 19 mg/dL (ref 10–36)
CO2: 22 mmol/L (ref 20–29)
Calcium: 9.1 mg/dL (ref 8.7–10.3)
Chloride: 106 mmol/L (ref 96–106)
Creatinine, Ser: 0.97 mg/dL (ref 0.57–1.00)
GFR calc Af Amer: 59 mL/min/{1.73_m2} — ABNORMAL LOW (ref >59)
GFR calc non Af Amer: 51 mL/min/{1.73_m2} — ABNORMAL LOW (ref >59)
Glucose: 114 mg/dL — ABNORMAL HIGH (ref 65–99)
Potassium: 4.6 mmol/L (ref 3.5–5.2)
Sodium: 142 mmol/L (ref 134–144)

## 2019-07-25 ENCOUNTER — Telehealth: Payer: Self-pay | Admitting: Cardiovascular Disease

## 2019-07-25 NOTE — Telephone Encounter (Signed)
Returned the patients call. Advised her that I do not see where anyone from our office called her today. Patient has no questions or concerns at this time. No further action required.

## 2019-07-25 NOTE — Telephone Encounter (Signed)
Patient returning call from someone but is not sure who called and this may be related to torsemide.

## 2019-10-03 ENCOUNTER — Telehealth: Payer: Self-pay | Admitting: Internal Medicine

## 2019-10-03 ENCOUNTER — Telehealth: Payer: Self-pay | Admitting: Cardiovascular Disease

## 2019-10-03 NOTE — Telephone Encounter (Signed)
Dr. Mortimer Fries please advise on message  had cxr and it showed effusion and they wanted to start her on a zpack unless Dr. Mortimer Fries wants her to do something different

## 2019-10-03 NOTE — Telephone Encounter (Signed)
Returned call to patient. I told her I cant see a lot from chart from [place] calls but I can see that Lasix is on her list twice daily instead of 2 tablets at once. She is not happy taking 2nd dose at 2 om "because she is up all night peeing".   She refused 2 pm dose today.   I advised her not to skip doses and advised that when she sees doctors making house calls next week she request that order be changed to 2 tablets once daily in the morning.   Until then, I think she will need to stay the course and take medications as written to avoid SOB and swelling.   She verbalized understanding and was in agreement with POC.

## 2019-10-03 NOTE — Telephone Encounter (Signed)
At her last visit this was addressed with our recommendations being for the patient to take 2 tabs of 20 mg Lasix in the morning for a total of 40 mg once daily rather than 20 mg twice daily.  Please convey to [facility] this is how the medication should be taken and they should update the records.

## 2019-10-03 NOTE — Telephone Encounter (Signed)
Pt c/o medication issue:  1. Name of Medication: furosemide  2. How are you currently taking this medication (dosage and times per day)? 80 ( 2 x 40 mg ) po q d   3. Are you having a reaction (difficulty breathing--STAT)? No   4. What is your medication issue?  Patient states [facility] has changed her furosemide to torsemide and instead of 80 po q d are giving 40 mg po BID   Patient complains of sob increased except and states she doesn't sleep well taking meds late in the day.   Patient wants to clarify that this was a changed made by Dr. Fletcher Anon or berge as facility  are messing with her medicine and she doesn't want them to do so.

## 2019-10-06 NOTE — Telephone Encounter (Signed)
Called and spoke with [facility]  [phone]  The patient recently had a CXR done and the provider at the facility where the patient lives [facility] and just wanted to make sure you were ok with this. They would like to ask this before making an appointment.   Please advise.

## 2019-10-06 NOTE — Telephone Encounter (Signed)
Hello, we don't know what symptoms patient had to have obtained the CXR and we dont know what her examination  I can NOT comment on any advise at this time unless she is seen by a provider. If she does NOT want to see a provider, she will need to go to ER if her symptoms worsen

## 2019-10-06 NOTE — Telephone Encounter (Signed)
Hello, she will need to see a provider when available

## 2019-10-07 NOTE — Telephone Encounter (Signed)
LMTCB for Jodi Bullock  I have blocked a spot on Dr Darnell Level schedule for 5/26 in case pt can come in then

## 2019-10-07 NOTE — Telephone Encounter (Signed)
Call to patient for update. Pt reports that she spoke with nurse last week for [facility] and med has been updated.   No further needs at this time.   Advised pt to call for any further questions or concerns.

## 2019-10-08 NOTE — Telephone Encounter (Signed)
Spoke with Claiborne Billings- pt started on zithromax 5/22 and her cough has improved. She is not having any fever or SOB. Does not feel like appt is needed. Doctors doing house calls to check in on her 10/09/19. Will call if needed.

## 2019-10-08 NOTE — Telephone Encounter (Signed)
Spoke with Claiborne Billings and offered the pt appt to be seen She states that the pt is being seen by physician there at [facility] and she will get update on pt status and call me back and let me know how pt is doing.  Will await her call back.

## 2019-10-10 ENCOUNTER — Telehealth: Payer: Self-pay | Admitting: Student

## 2019-10-10 NOTE — Telephone Encounter (Signed)
   Received page from [facility] about medication concern. Called and spoke with patient. Patient is a resident at [facility] and has concerns about how they are doing her Lasix. Per chart review, she is supposed to be taking 2 tablets of Lasix 20mg  (total of 40mg ) once daily in the morning. However, she states [employer] says she is supposed to take it twice a day. There is a similar phone note from 10/03/2019 that also emphasizes that she is supposed to be taking Lasix 40mg  once daily rather than 20mg  twice daily. Patient is very concerned about this and is refusing to take it twice a day. I reassured her that she is correct and is supposed to only be taking it once daily. I called and spoke with the med tech at [place], and informed her of correct dose of Lasix. I also faxed her the most recent Lasix prescription as well as the phone note from 10/03/2019.   Darreld Mclean, PA-C 10/10/2019 6:47 PM

## 2019-10-23 ENCOUNTER — Ambulatory Visit: Payer: Medicare Other | Admitting: Nurse Practitioner

## 2019-11-18 ENCOUNTER — Ambulatory Visit: Payer: Medicare Other | Admitting: Urology

## 2019-11-25 ENCOUNTER — Ambulatory Visit (INDEPENDENT_AMBULATORY_CARE_PROVIDER_SITE_OTHER): Payer: Medicare Other | Admitting: Urology

## 2019-11-25 ENCOUNTER — Encounter: Payer: Self-pay | Admitting: Urology

## 2019-11-25 ENCOUNTER — Other Ambulatory Visit: Payer: Self-pay

## 2019-11-25 VITALS — BP 128/74 | HR 76 | Ht 67.0 in | Wt 144.0 lb

## 2019-11-25 DIAGNOSIS — R3989 Other symptoms and signs involving the genitourinary system: Secondary | ICD-10-CM | POA: Diagnosis not present

## 2019-11-25 DIAGNOSIS — N952 Postmenopausal atrophic vaginitis: Secondary | ICD-10-CM

## 2019-11-25 DIAGNOSIS — N3281 Overactive bladder: Secondary | ICD-10-CM | POA: Diagnosis not present

## 2019-11-25 DIAGNOSIS — R3129 Other microscopic hematuria: Secondary | ICD-10-CM | POA: Diagnosis not present

## 2019-11-25 LAB — BLADDER SCAN AMB NON-IMAGING: Scan Result: 8

## 2019-11-25 NOTE — Progress Notes (Signed)
04/15/2019 1:39 PM   Jodi Bullock Nov 17, 1924 [id number]  Reason for visit: Follow up OAB  HPI: Jodi Bullock is a 86 year old female with a history of hematuria, OAB and vaginal atrophy who presents today for a follow up.  History of hematuria (high risk) Remote history of smoking.  CTU 04/2019 the adrenal glands are unremarkable.  Calcification in the right renal hilum measures 4 mm and is likely vascular in etiology. Similarly, there is a calcification in the upper pole of left kidney which is favored to be vascular. No convincing evidence for nephrolithiasis or obstructive uropathy. No enhancing kidney lesion identified. The ureters appear patent bilaterally. Urinary bladder is unremarkable. No focal bladder lesion.  Cysto with Dr. Diamantina Providence 05/2019 NED.  She denies any gross hematuria since last visit.     OAB Patient is currently taking Myrbetriq 25 mg daily.  Her BP is 128/74   Her PVR is 8 mL.  Failed anticholinergics, Myrbetriq and PTNS.  She states that she does not feel that she is getting her medication as scheduled.  I have checked the [facility] provided with her records and the medication has been marked as given.  She continues to experience perineal burning and suprapubic burning.  She states the urine even burns her skin on her hand if it hits it.  Patient has a difficult time staying on topic during the appointment.    Vaginal atrophy She is applying vaginal estrogen cream on Monday, Wednesday and Friday nights.  She states she has to apply the cream because the staff is not applying the cream.  The Premarin cream is also in the [facility] and marked as given.  PMH: Past Medical History:  Diagnosis Date  . Arthritis   . Atrial flutter ([place])    a. Dx 07/2018. CHA2DS2VASc = 5-->Eliquis.  . Bladder incontinence   . Breast cancer ([place]) 1998  . Chronic combined systolic and diastolic CHF (congestive heart failure) ([place]) 01/10/2016   a. 09/2015 Echo: EF 40-45%, possible inf HK, mod PAH  (PASP 90mHg); b. 11/2018 Echo: EF 60-65%, RVSP 642mg. Mod dil LA, mildly dil RA. Mod MR.  . Closed fracture of right femur ([facility]   a. 08/2017->Managed conservatively.  . Marland KitchenOVID-19 virus infection    a. 05/2019 - asymptomatic.  . Marland Kitchenepression   . GERD (gastroesophageal reflux disease)   . History of kidney stones   . HLD (hyperlipidemia)   . Hypertension   . Neuropathy of both feet   . NICM (nonischemic cardiomyopathy) ([facility]   a. 08/2015 Lexiscan MV: EF 39%, no ischemia; b. 09/2015 Echo: EF 40-45%.  . Persistent atrial fibrillation ([facility]  . Right Humerus head fracture    a. 07/2016 - managed conservatively.  . 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   [facility]   Surgical History: Past Surgical History:  Procedure Laterality Date  . ABDOMINAL HYSTERECTOMY    . BREAST LUMPECTOMY    . CATARACT EXTRACTION    . CHOLECYSTECTOMY    . JOINT REPLACEMENT Right    Hip  . KNEE SURGERY Right    Current Outpatient Medications on File Prior to Visit  Medication Sig Dispense Refill  . acetaminophen (TYLENOL) 500 MG tablet Take 500 mg by mouth every 4 (four) hours as needed for mild pain or fever.    . Marland Kitchencetaminophen (TYLENOL) 650 MG CR tablet Take 650 mg by mouth 3 (three) times daily.    . Marland Kitchenlum & mag hydroxide-simeth (MINTOX) 200-200-20 MG/5ML suspension Take 30 mLs  by mouth at bedtime as needed for indigestion or heartburn.    Marland Kitchen apixaban (ELIQUIS) 5 MG TABS tablet Take 1 tablet (5 mg total) by mouth 2 (two) times daily. 180 tablet 1  . carbamide peroxide (GLY-OXIDE) 10 % solution Place 1 application onto teeth 4 (four) times daily -  with meals and at bedtime. (as needed for dry mouth)    . carvedilol (COREG) 12.5 MG tablet Take 1.5 tablets (18.75 mg total) by mouth 2 (two) times daily.    . Cholecalciferol 2000 UNITS TABS Take 2,000 Units by mouth daily.     Marland Kitchen conjugated estrogens (PREMARIN) vaginal cream Apply 0.105m (pea-sized amount)  just inside the vaginal introitus with a finger-tip on  Monday, Wednesday and  Friday nights. 30 g 12  . Cyanocobalamin (VITAMIN DEFICIENCY SYSTEM-B12) 1000 MCG/ML KIT Inject 1,000 mcg as directed every 30 (thirty) days.     . diclofenac sodium (VOLTAREN) 1 % GEL Apply 2 g topically 2 (two) times daily as needed (knee pain).    .Marland Kitchendocusate sodium (COLACE) 100 MG capsule Take 100 mg by mouth at bedtime.     . fluticasone (FLONASE) 50 MCG/ACT nasal spray Place 1 spray into both nostrils daily. 16 g 2  . Fluticasone Furoate (ARNUITY ELLIPTA) 100 MCG/ACT AEPB Inhale 1 puff into the lungs daily. 30 each 10  . furosemide (LASIX) 20 MG tablet Take 2 tablets (40MG) by mouth daily in the morning. 180 tablet 3  . guaiFENesin (MUCINEX) 600 MG 12 hr tablet Take 600 mg by mouth 2 (two) times daily as needed for cough or to loosen phlegm.     .Marland KitchenguaiFENesin (ROBITUSSIN) 100 MG/5ML SOLN Take 10 mLs by mouth every 6 (six) hours as needed for cough or to loosen phlegm.    . loperamide (IMODIUM A-D) 2 MG tablet Take 2 mg by mouth as needed for diarrhea or loose stools (max 4 doses daily).     . LORazepam (ATIVAN) 0.5 MG tablet Take 1 tablet (0.5 mg total) by mouth 2 (two) times daily as needed for anxiety. (Patient taking differently: Take 0.5 mg by mouth 2 (two) times daily. ) 60 tablet 2  . losartan (COZAAR) 100 MG tablet Take 1 tablet (100 mg total) by mouth daily. 30 tablet 2  . lovastatin (MEVACOR) 40 MG tablet Take 1 tablet (40 mg total) by mouth daily. (Patient taking differently: Take 40 mg by mouth every Monday, Wednesday, and Friday. ) 90 tablet 0  . Lutein 10 MG TABS Take 10 mg by mouth 2 (two) times daily.    . magnesium hydroxide (MILK OF MAGNESIA) 400 MG/5ML suspension Take 30 mLs by mouth at bedtime as needed for mild constipation.    . mirabegron ER (MYRBETRIQ) 50 MG TB24 tablet Take 1 tablet (50 mg total) by mouth daily. 30 tablet 11  . neomycin-bacitracin-polymyxin (NEOSPORIN) [phone] ointment Apply topically 4 (four) times daily as needed (wound care).    . polyethylene  glycol (MIRALAX / GLYCOLAX) 17 g packet Take 17 g by mouth daily.     . Saccharomyces boulardii (PROBIOTIC) 250 MG CAPS Take 1 capsule by mouth daily.     . traMADol (ULTRAM) 50 MG tablet Take 50 mg by mouth daily.     .Marland Kitchentrimethoprim (TRIMPEX) 100 MG tablet Take 1 tablet (100 mg total) by mouth daily. 30 tablet 11   No current facility-administered medications on file prior to visit.   Allergies  Allergen Reactions  . 2,4-D Dimethylamine (Amisol) Other (See  Comments)    Other Reaction: OTHER REACTION-IRRITATION TO Esophagous  . Celebrex [Celecoxib] Other (See Comments)     esophagitis Esophageal spasms    . Ciprofloxacin Other (See Comments)    Esophageal irritation  . Ibuprofen Other (See Comments)    Other reaction(s): Other (See Comments) reflux  . Levamisole Other (See Comments) and Nausea And Vomiting  . Metronidazole Other (See Comments)    neuropathy  . Naproxen Other (See Comments)    GI UPSET  . Other Other (See Comments)    Other Reaction: esophagitis   Family History  Problem Relation Age of Onset  . Stroke Mother   . Diabetes Father   . Heart disease Father   . Kidney disease Neg Hx   . Bladder Cancer Neg Hx   . Kidney cancer Neg Hx    Social History   Socioeconomic History  . Marital status: Married    Spouse name: Not on file  . Number of children: Not on file  . Years of education: Not on file  . Highest education level: Not on file  Occupational History  . Not on file  Tobacco Use  . Smoking status: Former Research scientist (life sciences)  . Smokeless tobacco: Never Used  . Tobacco comment: smoked in high school for a few years only  Vaping Use  . Vaping Use: Never used  Substance and Sexual Activity  . Alcohol use: No    Alcohol/week: 0.0 standard drinks  . Drug use: No  . Sexual activity: Not Currently  Other Topics Concern  . Not on file  Social History Narrative  . Not on file   Social Determinants of Health   Financial Resource Strain:   . Difficulty of  Paying Living Expenses:   Food Insecurity:   . Worried About Charity fundraiser in the Last Year:   . Arboriculturist in the Last Year:   Transportation Needs:   . Film/video editor (Medical):   Marland Kitchen Lack of Transportation (Non-Medical):   Physical Activity:   . Days of Exercise per Week:   . Minutes of Exercise per Session:   Stress:   . Feeling of Stress :   Social Connections:   . Frequency of Communication with Friends and Family:   . Frequency of Social Gatherings with Friends and Family:   . Attends Religious Services:   . Active Member of Clubs or Organizations:   . Attends Archivist Meetings:   Marland Kitchen Marital Status:   Intimate Partner Violence:   . Fear of Current or Ex-Partner:   . Emotionally Abused:   Marland Kitchen Physically Abused:   . Sexually Abused:    ROS: Please see flowsheet from today's date for complete review of systems.  Physical Exam: BP 128/74   Pulse 76   Ht 5' 7" (1.702 m)   Wt 144 lb (65.3 kg)   BMI 22.55 kg/m   Constitutional:  Well nourished. Alert and oriented, No acute distress. HEENT: [place] AT, mask in place.  Trachea midline Cardiovascular: No clubbing, cyanosis, or edema. Respiratory: Normal respiratory effort, no increased work of breathing. Neurologic: Grossly intact, no focal deficits, moving all 4 extremities. Psychiatric: Normal mood and affect.   Laboratory Data: No recent labs    Pertinent Imaging Results for orders placed or performed in visit on 11/25/19  Bladder Scan (Post Void Residual) in office  Result Value Ref Range   Scan Result 8     Assessment and plan:  1. History of  hematuria Hematuria work up completed in 05/2019 - findings positive for NED No report of gross hematuria  Patient to report any gross hematuria   2. Bladder pain Explained to the patient that we are unable to discover a urological etiology for her burning.  I stated that we have completed hematuria work-up which was negative and she has had  several urine cultures which have also been negative. I offered her to have a bladder rescue instillation to see if this would offer any relief, but she has deferred She states that she would like to concentrate on her husband's health issues at the moment and will contact us for further appointments  3. OAB Continue Myrbetriq 25 mg daily  4. Vaginal atrophy She will continue the vaginal estrogen cream three nights weekly  [facility] [address], [place], [place] [address] [phone]  I, Joneen Boers Peace, am acting as a Education administrator for [employer].  I have reviewed the above documentation for accuracy and completeness, and I agree with the above.    Zara Council, PA-C

## 2019-11-25 NOTE — Patient Instructions (Signed)
Return as needed

## 2019-11-28 ENCOUNTER — Other Ambulatory Visit: Payer: Self-pay

## 2019-11-28 ENCOUNTER — Encounter: Payer: Self-pay | Admitting: Nurse Practitioner

## 2019-11-28 ENCOUNTER — Ambulatory Visit (INDEPENDENT_AMBULATORY_CARE_PROVIDER_SITE_OTHER): Payer: Medicare Other | Admitting: Nurse Practitioner

## 2019-11-28 VITALS — BP 100/60 | HR 50 | Ht 66.0 in | Wt 149.2 lb

## 2019-11-28 DIAGNOSIS — I5042 Chronic combined systolic (congestive) and diastolic (congestive) heart failure: Secondary | ICD-10-CM | POA: Diagnosis not present

## 2019-11-28 DIAGNOSIS — I4821 Permanent atrial fibrillation: Secondary | ICD-10-CM

## 2019-11-28 DIAGNOSIS — I10 Essential (primary) hypertension: Secondary | ICD-10-CM | POA: Diagnosis not present

## 2019-11-28 NOTE — Patient Instructions (Addendum)
Medication Instructions:  Your physician recommends that you continue on your current medications as directed. Please refer to the Current Medication list given to you today.  *If you need a refill on your cardiac medications before your next appointment, please call your pharmacy*   Lab Work: Your physician recommends that you have lab work today(BMET)  If you have labs (blood work) drawn today and your tests are completely normal, you will receive your results only by: Marland Kitchen MyChart Message (if you have MyChart) OR . A paper copy in the mail If you have any lab test that is abnormal or we need to change your treatment, we will call you to review the results.   Testing/Procedures:none ordered  Follow-Up: At [facility], you and your health needs are our priority.  As part of our continuing mission to provide you with exceptional heart care, we have created designated Provider Care Teams.  These Care Teams include your primary Cardiologist (physician) and Advanced Practice Providers (APPs -  Physician Assistants and Nurse Practitioners) who all work together to provide you with the care you need, when you need it.  We recommend signing up for the patient portal called "MyChart".  Sign up information is provided on this After Visit Summary.  MyChart is used to connect with patients for Virtual Visits (Telemedicine).  Patients are able to view lab/test results, encounter notes, upcoming appointments, etc.  Non-urgent messages can be sent to your provider as well.   To learn more about what you can do with MyChart, go to [email or web address].    Your next appointment:   3 month(s)  The format for your next appointment:   In Person  Provider:    Please see Kathlyn Sacramento, MD

## 2019-11-28 NOTE — Progress Notes (Signed)
Office Visit    Patient Name: Jodi Bullock Date of Encounter: 11/28/2019  Primary Care Provider:  Orvis Bullock, [facility] Primary Cardiologist:  Jodi Sacramento, MD  Chief Complaint    86 year old female with history of nonischemic cardiomyopathy, chronic heart failure with improved ejection fraction, hypertension, hyperlipidemia, thymus cancer, right breast cancer, COVID-19 (January 2021, and permanent A. fib/flutter, who presents for follow-up of heart failure.  Past Medical History    Past Medical History:  Diagnosis Date  . Arthritis   . Atrial flutter ([place])    a. Dx 07/2018. CHA2DS2VASc = 5-->Eliquis.  . Bladder incontinence   . Breast cancer ([place]) 1998  . Chronic heart failure with improved ejection fraction (HFimpEF) 01/10/2016   a. 09/2015 Echo: EF 40-45%, possible inf HK, mod PAH (PASP 60mHg); b. 11/2018 Echo: EF 60-65%, RVSP 624mg. Mod dil LA, mildly dil RA. Mod MR.  . Closed fracture of right femur ([place]   a. 08/2017->Managed conservatively.  . Marland KitchenOVID-19 virus infection    a. 05/2019 - asymptomatic.  . Marland Kitchenepression   . GERD (gastroesophageal reflux disease)   . History of kidney stones   . HLD (hyperlipidemia)   . Hypertension   . Neuropathy of both feet   . NICM (nonischemic cardiomyopathy) ([facility]   a. 08/2015 Lexiscan MV: EF 39%, no ischemia; b. 09/2015 Echo: EF 40-45%.  . Persistent atrial fibrillation ([facility]  . Right Humerus head fracture    a. 07/2016 - managed conservatively.  . 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   [facility]   Past Surgical History:  Procedure Laterality Date  . ABDOMINAL HYSTERECTOMY    . BREAST LUMPECTOMY    . CATARACT EXTRACTION    . CHOLECYSTECTOMY    . JOINT REPLACEMENT Right    Hip  . KNEE SURGERY Right     Allergies  Allergies  Allergen Reactions  . 2,4-D Dimethylamine (Amisol) Other (See Comments)    Other Reaction: OTHER REACTION-IRRITATION TO Esophagous  . Celebrex [Celecoxib] Other (See Comments)     esophagitis Esophageal spasms     . Ciprofloxacin Other (See Comments)    Esophageal irritation  . Ibuprofen Other (See Comments)    Other reaction(s): Other (See Comments) reflux  . Levamisole Other (See Comments) and Nausea And Vomiting  . Metronidazole Other (See Comments)    neuropathy  . Naproxen Other (See Comments)    GI UPSET  . Other Other (See Comments)    Other Reaction: esophagitis    History of Present Illness    [age]ear old female with above complex past medical history including hypertension, permanent atrial flutter/fibrillation, hyperlipidemia, GERD, nonischemic cardiomyopathy, chronic heart failure with improved ejection fraction, thymus cancer, and remote breast cancer.  Cardiac history dates back to April 2017, when she was evaluated for exertional dyspnea.  Stress testing showed an EF of 39% without ischemia.  Follow-up echo showed an EF of 45 to 45% with possible inferior hypokinesis.  She has been medically managed.  In March 2020, she was diagnosed with atrial flutter, which has been permanent and asymptomatic.  She has been anticoagulated with Eliquis.  Most recent echocardiogram in July 2020 showed an EF of 65 to 65% with an RVSP of 61 mmHg.  In January 2019, she was diagnosed with COVID-19.  She was relatively asymptomatic and did not require hospitalization however her husband was hospitalized for 4 days and then sent to rehab for 2 weeks.  She was last seen in clinic in March of this year, at which  time she was having a fair amount of stress and anxiety related to the facility at which she and her husband live but clinically was stable.  Since her last visit, she has been relatively stable.  Her husband has continued to have setbacks and was recently admitted for urinary tract infection.  He is now under palliative care.  [name] notes that the facility still does not always get her Lasix dose right.  She is supposed be taking Lasix 40 mg daily and sometimes they will give her 20 mg daily or try  to give her 20 mg twice daily.  She has significant issues with p.m. dose of Lasix in the setting of nocturia thus the importance of once daily dosing.  She notes that her mobility has slowed down some but she has not been experiencing significant chest pain or dyspnea.  She denies palpitations, PND, orthopnea, dizziness, syncope, or early satiety.  In the setting of variable Lasix dosing, she has had variable lower extremity swelling.  Home Medications    Prior to Admission medications   Medication Sig Start Date End Date Taking? Authorizing Provider  acetaminophen (TYLENOL) 500 MG tablet Take 500 mg by mouth every 4 (four) hours as needed for mild pain or fever.    [provider]  acetaminophen (TYLENOL) 650 MG CR tablet Take 650 mg by mouth 3 (three) times daily.    [provider]  alum & mag hydroxide-simeth (MINTOX) 200-200-20 MG/5ML suspension Take 30 mLs by mouth at bedtime as needed for indigestion or heartburn.    [provider]  apixaban (ELIQUIS) 5 MG TABS tablet Take 1 tablet (5 mg total) by mouth 2 (two) times daily. 07/03/19   Wellington Hampshire, MD  carbamide peroxide (GLY-OXIDE) 10 % solution Place 1 application onto teeth 4 (four) times daily -  with meals and at bedtime. (as needed for dry mouth)    [provider]  carvedilol (COREG) 12.5 MG tablet Take 1.5 tablets (18.75 mg total) by mouth 2 (two) times daily. 02/22/18 09/19/27  Theora Gianotti, NP  Cholecalciferol 2000 UNITS TABS Take 2,000 Units by mouth daily.     Roselee Nova, MD  conjugated estrogens (PREMARIN) vaginal cream Apply 0.13m (pea-sized amount)  just inside the vaginal introitus with a finger-tip on  Monday, Wednesday and Friday nights. 04/15/19   MZara CouncilA, PA-C  Cyanocobalamin (VITAMIN DEFICIENCY SYSTEM-B12) 1000 MCG/ML KIT Inject 1,000 mcg as directed every 30 (thirty) days.     [provider]  diclofenac sodium (VOLTAREN) 1 % GEL Apply 2 g  topically 2 (two) times daily as needed (knee pain).    [provider]  docusate sodium (COLACE) 100 MG capsule Take 100 mg by mouth at bedtime.     [provider]  fluticasone (FLONASE) 50 MCG/ACT nasal spray Place 1 spray into both nostrils daily. 08/30/17   SWilhelmina Mcardle MD  Fluticasone Furoate (ARNUITY ELLIPTA) 100 MCG/ACT AEPB Inhale 1 puff into the lungs daily. 08/30/17   SWilhelmina Mcardle MD  furosemide (LASIX) 20 MG tablet Take 2 tablets (40MG) by mouth daily in the morning. 07/23/19   BTheora Gianotti NP  guaiFENesin (MUCINEX) 600 MG 12 hr tablet Take 600 mg by mouth 2 (two) times daily as needed for cough or to loosen phlegm.     [provider]  guaiFENesin (ROBITUSSIN) 100 MG/5ML SOLN Take 10 mLs by mouth every 6 (six) hours as needed for cough or to  loosen phlegm.    [provider]  loperamide (IMODIUM A-D) 2 MG tablet Take 2 mg by mouth as needed for diarrhea or loose stools (max 4 doses daily).     [provider]  LORazepam (ATIVAN) 0.5 MG tablet Take 1 tablet (0.5 mg total) by mouth 2 (two) times daily as needed for anxiety. Patient taking differently: Take 0.5 mg by mouth 2 (two) times daily.  05/23/17   Roselee Nova, MD  losartan (COZAAR) 100 MG tablet Take 1 tablet (100 mg total) by mouth daily. 05/23/17   Roselee Nova, MD  lovastatin (MEVACOR) 40 MG tablet Take 1 tablet (40 mg total) by mouth daily. Patient taking differently: Take 40 mg by mouth every Monday, Wednesday, and Friday.  05/23/17   Roselee Nova, MD  Lutein 10 MG TABS Take 10 mg by mouth 2 (two) times daily.    [provider]  magnesium hydroxide (MILK OF MAGNESIA) 400 MG/5ML suspension Take 30 mLs by mouth at bedtime as needed for mild constipation.    [provider]  mirabegron ER (MYRBETRIQ) 50 MG TB24 tablet Take 1 tablet (50 mg total) by mouth daily. 05/21/19   Billey Co, MD  neomycin-bacitracin-polymyxin (NEOSPORIN)  [phone] ointment Apply topically 4 (four) times daily as needed (wound care).    [provider]  polyethylene glycol (MIRALAX / GLYCOLAX) 17 g packet Take 17 g by mouth daily.     [provider]  Saccharomyces boulardii (PROBIOTIC) 250 MG CAPS Take 1 capsule by mouth daily.     [provider]  traMADol (ULTRAM) 50 MG tablet Take 50 mg by mouth daily.     [provider]  trimethoprim (TRIMPEX) 100 MG tablet Take 1 tablet (100 mg total) by mouth daily. 03/04/18   Bjorn Loser, MD    Review of Systems    Still with life stress in the setting of her husband's poor health and unhappiness with her living situation.  Ongoing intermittent lower extremity swelling.  She denies dyspnea, palpitations, chest pain, PND, orthopnea, dizziness, syncope, or early satiety.  All other systems reviewed and are otherwise negative except as noted above.  Physical Exam    VS:  BP 100/60 (BP Location: Right Arm, Patient Position: Sitting, Cuff Size: Normal)   Pulse (!) 50   Ht _0  (1.676 m)   Wt 149 lb 4 oz (67.7 kg)   SpO2 97%   BMI 24.09 kg/m  , BMI Body mass index is 24.09 kg/m. GEN: Well nourished, well developed, in no acute distress. HEENT: normal. Neck: Supple, no JVD, carotid bruits, or masses. Cardiac: Irregularly, irregular, no murmurs, rubs, or gallops. No clubbing, cyanosis, trace to 1+ right greater than left lower extremity edema.  Radials/PT 2+ and equal bilaterally.  Respiratory:  Respirations regular and unlabored, clear to auscultation bilaterally. GI: Soft, nontender, nondistended, BS + x 4. MS: no deformity or atrophy. Skin: warm and dry, no rash. Neuro:  Strength and sensation are intact. Psych: Normal affect.  Accessory Clinical Findings    ECG personally reviewed by me today -atrial fibrillation, 50, left axis, ? prior inferior and septal infarct- no acute changes.  Lab Results  Component Value Date   WBC 7.3 03/15/2019   HGB  14.3 03/15/2019   HCT 45.4 03/15/2019   MCV 91.9 03/15/2019   PLT 151 03/15/2019   Lab Results  Component Value Date   CREATININE 0.97 07/23/2019   BUN 19 07/23/2019  NA 142 07/23/2019   K 4.6 07/23/2019   CL 106 07/23/2019   CO2 22 07/23/2019   Lab Results  Component Value Date   ALT 7 03/15/2019   AST 23 03/15/2019   ALKPHOS 62 03/15/2019   BILITOT 1.0 03/15/2019   Lab Results  Component Value Date   CHOL 139 05/24/2017   HDL 48 (L) 05/24/2017   LDLCALC 67 05/24/2017   TRIG 163 (H) 05/24/2017   CHOLHDL 2.9 05/24/2017    Lab Results  Component Value Date   HGBA1C 5.4 02/14/2016    Assessment & Plan    1.  Chronic heart failure with improved ejection fraction: EF previously low at 40-45% in May 2017 however this has since recovered to 60-65% by echo in July 2020.  She has been well managed on Lasix 40 mg daily but notes that her living facility has been giving her Lasix 20 mg daily on some days and 40 mg daily on others.  She does have mild lower extremity swelling today and her weight is up compared to prior visit.  I have written in the notes that go back to her facility that she should be taking 40 mg daily.  Apparently some staff members there tell her they do not feel as though she needs 40 mg.  I will follow-up a basic metabolic panel today.  2.  Persistent atrial fibrillation/flutter: This is rate controlled on beta-blocker therapy.  She is anticoagulated with Eliquis.    3.  Essential hypertension: Blood pressure is on the low side today although she is typically running in the 130s to 140s at home.  I will not make any adjustments today.  She remains on beta-blocker, ARB, and diuretic.  4.  Hyperlipidemia: She remains on statin therapy.  5.  Disposition: Follow-up basic metabolic panel today.  Follow-up in clinic in 3 months or sooner if necessary.   Murray Hodgkins, NP 11/28/2019, 5:08 PM

## 2019-11-29 LAB — BASIC METABOLIC PANEL
BUN/Creatinine Ratio: 21 (ref 12–28)
BUN: 21 mg/dL (ref 10–36)
CO2: 27 mmol/L (ref 20–29)
Calcium: 9 mg/dL (ref 8.7–10.3)
Chloride: 105 mmol/L (ref 96–106)
Creatinine, Ser: 0.99 mg/dL (ref 0.57–1.00)
GFR calc Af Amer: 57 mL/min/{1.73_m2} — ABNORMAL LOW (ref >59)
GFR calc non Af Amer: 50 mL/min/{1.73_m2} — ABNORMAL LOW (ref >59)
Glucose: 114 mg/dL — ABNORMAL HIGH (ref 65–99)
Potassium: 4.5 mmol/L (ref 3.5–5.2)
Sodium: 143 mmol/L (ref 134–144)

## 2019-12-09 ENCOUNTER — Telehealth: Payer: Self-pay | Admitting: Cardiovascular Disease

## 2019-12-09 NOTE — Telephone Encounter (Signed)
Returned the patient call. Lmtcb.  Per Jodi Bayley, NP 11/28/19 o/v note  I have written in the notes that go back to her facility that she should be taking 40 mg daily.  Apparently some staff members there tell her they do not feel as though she needs 40 mg.  I will follow-up a basic metabolic panel today. *No medication changes were made after the patients lab results.

## 2019-12-09 NOTE — Telephone Encounter (Signed)
Pt c/o medication issue:  1. Name of Medication: lasix   2. How are you currently taking this medication (dosage and times per day)? At facility given 20 or 40 mg po q d  it depends on day    3. Are you having a reaction (difficulty breathing--STAT)? no  4. What is your medication issue? Patient says her lasix dose has been inconsistent and she wants to know if berge changed the dose based on lab results.  Please call to discuss rx

## 2019-12-09 NOTE — Telephone Encounter (Signed)
Patient returning call.   Declined to hold or call back.  Relayed to patient msg below.   She states I think I am right and lets just let it go at that

## 2019-12-12 ENCOUNTER — Telehealth: Payer: Self-pay

## 2019-12-12 NOTE — Telephone Encounter (Signed)
Spoke to patient and she states she did not call or need a refill on this medication. She doesn't know what it is.

## 2019-12-12 NOTE — Telephone Encounter (Signed)
Alita from [facility] called the triage line about patient prescription phenazopyridine. Caller states patient has about 9 days left of prescription and then her insurance will no longer cover it. Caller would like to know if [place] would like to d/c medication or switch to something else. Please advise.

## 2019-12-12 NOTE — Telephone Encounter (Signed)
I do not see where I have prescribed that medication (phenazopyridine), so it would be best to ask the provider who prescribed that medication.

## 2020-01-01 ENCOUNTER — Telehealth: Payer: Self-pay | Admitting: Cardiovascular Disease

## 2020-01-01 NOTE — Telephone Encounter (Signed)
Attempted to contact the patient x2. lmtcb.

## 2020-01-01 NOTE — Telephone Encounter (Signed)
Pt c/o Shortness Of Breath: STAT if SOB developed within the last 24 hours or pt is noticeably SOB on the phone  1. Are you currently SOB (can you hear that pt is SOB on the phone)? yes  2. How long have you been experiencing SOB? 1 week but getting worse  3. Are you SOB when sitting or when up moving around? Mainly moving around  4. Are you currently experiencing any other symptoms? Occasional chest pain, BP is increasing 168/?  Patient is also complaining of some issue with her lasix and not using the bathroom like she feel she should  Patient states she would really like to see Jodi Bullock, she has been scheduled with him on 8/26

## 2020-01-07 ENCOUNTER — Telehealth: Payer: Self-pay | Admitting: Nurse Practitioner

## 2020-01-07 NOTE — Telephone Encounter (Signed)
Patient returning call about below   Also currently required to quarantine so she needs to know if a virtial visit is ok or reschedule tomorrows visit.

## 2020-01-07 NOTE — Telephone Encounter (Signed)
  Patient Consent for Virtual Visit         Jodi Bullock has provided verbal consent on 01/07/2020 for a virtual visit (video or telephone).   CONSENT FOR VIRTUAL VISIT FOR:  Jodi Bullock  By participating in this virtual visit I agree to the following:  I hereby voluntarily request, consent and authorize [facility] and its employed or contracted physicians, physician assistants, nurse practitioners or other licensed health care professionals (the Practitioner), to provide me with telemedicine health care services (the "Services") as deemed necessary by the treating Practitioner. I acknowledge and consent to receive the Services by the Practitioner via telemedicine. I understand that the telemedicine visit will involve communicating with the Practitioner through live audiovisual communication technology and the disclosure of certain medical information by electronic transmission. I acknowledge that I have been given the opportunity to request an in-person assessment or other available alternative prior to the telemedicine visit and am voluntarily participating in the telemedicine visit.  I understand that I have the right to withhold or withdraw my consent to the use of telemedicine in the course of my care at any time, without affecting my right to future care or treatment, and that the Practitioner or I may terminate the telemedicine visit at any time. I understand that I have the right to inspect all information obtained and/or recorded in the course of the telemedicine visit and may receive copies of available information for a reasonable fee.  I understand that some of the potential risks of receiving the Services via telemedicine include:  Marland Kitchen Delay or interruption in medical evaluation due to technological equipment failure or disruption; . Information transmitted may not be sufficient (e.g. poor resolution of images) to allow for appropriate medical decision making by the Practitioner;  and/or  . In rare instances, security protocols could fail, causing a breach of personal health information.  Furthermore, I acknowledge that it is my responsibility to provide information about my medical history, conditions and care that is complete and accurate to the best of my ability. I acknowledge that Practitioner's advice, recommendations, and/or decision may be based on factors not within their control, such as incomplete or inaccurate data provided by me or distortions of diagnostic images or specimens that may result from electronic transmissions. I understand that the practice of medicine is not an exact science and that Practitioner makes no warranties or guarantees regarding treatment outcomes. I acknowledge that a copy of this consent can be made available to me via my patient portal ([facility]), or I can request a printed copy by calling the office of [facility].    I understand that my insurance will be billed for this visit.   I have read or had this consent read to me. . I understand the contents of this consent, which adequately explains the benefits and risks of the Services being provided via telemedicine.  . I have been provided ample opportunity to ask questions regarding this consent and the Services and have had my questions answered to my satisfaction. . I give my informed consent for the services to be provided through the use of telemedicine in my medical care

## 2020-01-07 NOTE — Telephone Encounter (Signed)
Spoke with the Jodi Bullock Jodi Bullock sts that she would like to convert her appt tomorrow 01/08/20 @ 8:25am with Ignacia Bayley, NP to a telephone visit. Jodi Bullock sts that here [facility]cp [facility] has made some recent medication changes that have helped with her sob and bp, she would like to discuss with Gerald Stabs.  Adv the Jodi Bullock that she will be called 10-15 mins prior to her appt to review her medications and vs Jodi Bullock verbalized understanding and voiced appreciation for the call.Jodi Bullock

## 2020-01-08 ENCOUNTER — Telehealth (INDEPENDENT_AMBULATORY_CARE_PROVIDER_SITE_OTHER): Payer: Medicare Other | Admitting: Nurse Practitioner

## 2020-01-08 ENCOUNTER — Other Ambulatory Visit: Payer: Self-pay

## 2020-01-08 ENCOUNTER — Telehealth: Payer: Self-pay

## 2020-01-08 ENCOUNTER — Encounter: Payer: Self-pay | Admitting: Nurse Practitioner

## 2020-01-08 VITALS — BP 204/84 | Ht 66.0 in | Wt 147.3 lb

## 2020-01-08 DIAGNOSIS — I5033 Acute on chronic diastolic (congestive) heart failure: Secondary | ICD-10-CM | POA: Diagnosis not present

## 2020-01-08 DIAGNOSIS — I4892 Unspecified atrial flutter: Secondary | ICD-10-CM

## 2020-01-08 DIAGNOSIS — I10 Essential (primary) hypertension: Secondary | ICD-10-CM

## 2020-01-08 NOTE — Telephone Encounter (Signed)
CMA called Jodi Bullock to get her checked in for virtual this morning. She reports HTN over the past month.   This morning BP taken prior to medications. 204/84. Pt speaking very fast and sounds anxious. Pt audibly SOB.   She reports being in quarantine since Monday as the whole center is on lock down.   Pt reports fullness in stomach and SOBOE. Denies inc in leg swelling.   She reports taking medications as ordered. Visiting doctor increased lasix to 80 mg daily.   BP this month: 8/8 167/60 8/12 199/78 8/15 158/71 8/23 182/70 25 196/74   Routing to provider for virtual appt.

## 2020-01-08 NOTE — Progress Notes (Signed)
Virtual Visit via Telephone Note   This visit type was conducted due to national recommendations for restrictions regarding the COVID-19 Pandemic (e.g. social distancing) in an effort to limit this patient's exposure and mitigate transmission in our community.  Due to her co-morbid illnesses, this patient is at least at moderate risk for complications without adequate follow up.  This format is felt to be most appropriate for this patient at this time.  The patient did not have access to video technology/had technical difficulties with video requiring transitioning to audio format only (telephone).  All issues noted in this document were discussed and addressed.  No physical exam could be performed with this format.  Please refer to the patient's chart for her  consent to telehealth for [facility]. Evaluation Performed:  Follow-up visit  This visit type was conducted due to national recommendations for restrictions regarding the COVID-19 Pandemic (e.g. social distancing).  This format is felt to be most appropriate for this patient at this time.  All issues noted in this document were discussed and addressed.  No physical exam was performed (except for noted visual exam findings with Video Visits).  Please refer to the patient's chart (MyChart message for video visits and phone note for telephone visits) for the patient's consent to telehealth for [facility]. _____________   Date:  01/08/2020   Patient ID:  Jodi Bullock, Jodi Bullock 23, 1926, MRN [id number] Patient Location:  Home Provider location:   Office  Primary Care Provider:  [facility] Primary Cardiologist:  Kathlyn Sacramento, MD  Chief Complaint    86 year old female with history of nonischemic cardiomyopathy, chronic heart failure with improved ejection fraction, hypertension, hyperlipidemia, thymus cancer, right breast cancer, COVID-19 (January 2021), and permanent atrial fibrillation/flutter, who presents for  follow-up of heart failure.  Past Medical History    Past Medical History:  Diagnosis Date  . Arthritis   . Atrial flutter ([place])    a. Dx 07/2018. CHA2DS2VASc = 5-->Eliquis.  . Bladder incontinence   . Breast cancer ([place]) 1998  . Chronic heart failure with improved ejection fraction (HFimpEF) 01/10/2016   a. 09/2015 Echo: EF 40-45%, possible inf HK, mod PAH (PASP 13mHg); b. 11/2018 Echo: EF 60-65%, RVSP 658mg. Mod dil LA, mildly dil RA. Mod MR.  . Cl[place] of right [place] ([place]   a. 08/2017->Managed conservatively.  . Marland KitchenOVID-19 virus infection    a. 05/2019 - asymptomatic.  . Marland Kitchenepression   . GERD (gastroesophageal reflux disease)   . History of kidney stones   . HLD (hyperlipidemia)   . Hypertension   . Neuropathy of both feet   . NICM (nonischemic cardiomyopathy) ([facility]   a. 08/2015 Lexiscan MV: EF 39%, no ischemia; b. 09/2015 Echo: EF 40-45%.  . Persistent atrial fibrillation ([facility]  . Right Humerus head fracture    a. 07/2016 - managed conservatively.  . 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   [facility]   Past Surgical History:  Procedure Laterality Date  . ABDOMINAL HYSTERECTOMY    . BREAST LUMPECTOMY    . CATARACT EXTRACTION    . CHOLECYSTECTOMY    . JOINT REPLACEMENT Right    Hip  . KNEE SURGERY Right     Allergies  Allergies  Allergen Reactions  . 2,4-D Dimethylamine (Amisol) Other (See Comments)    Other Reaction: OTHER REACTION-IRRITATION TO Esophagous  . Celebrex [Celecoxib] Other (See Comments)     esophagitis Esophageal spasms    . Ciprofloxacin Other (See Comments)    Esophageal  irritation  . Ibuprofen Other (See Comments)    Other reaction(s): Other (See Comments) reflux  . Levamisole Other (See Comments) and Nausea And Vomiting  . Metronidazole Other (See Comments)    neuropathy  . Naproxen Other (See Comments)    GI UPSET  . Other Other (See Comments)    Other Reaction: esophagitis    History of Present Illness    Jodi Bullock is a 86 y.o. female who  presents via audio/video conferencing for a telehealth visit today. As above, she has a history of hypertension, permanent atrial fibrillation/flutter, hyperlipidemia, GERD, nonischemic cardiomyopathy, chronic heart failure with improved EF, thymus cancer, and remote breast cancer. Cardiac history dates back to April 2017, when she was evaluated for exertional dyspnea. Stress testing showed an EF of 39% without ischemia. Follow-up echo showed an EF of 45-50% with possible inferior hypokinesis. She has been medically managed. March 2020, she was diagnosed with atrial flutter, which has been permanent and asymptomatic. She has been anticoagulated with Eliquis. Most recent echocardiogram July 2020 showed an EF of 60-65% with an RVSP of 61 mmHg. In January 2021 she was diagnosed with COVID-19. She was relatively asymptomatic and did not require hospitalization however, her husband was hospitalized for 4 days and then sent to rehab for 2 weeks. At her most recent clinic visit in July, she was experiencing intermittent mild lower extremity swelling which she felt was related to inconsistent dosing of her Lasix by her assisted living facility. We agreed that she should be on 40 mg daily and orders were written.  Unfortunately, over the past month, she has been noticing increasing lower extremity swelling, increasing dyspnea on exertion, increased abdominal bloating, chest wall tenderness, and elevated blood pressures, frequently in the 200 range. She was seen by the housestaff this past Monday and her Lasix dose was increased to 80 mg daily. She is not sure that this helping. On her phone visit today, her speech is quite labored and I have recommended ER evaluation.  The patient does not have symptoms concerning for COVID-19 infection (fever, chills, cough, or new shortness of breath).   Home Medications    Prior to Admission medications   Medication Sig Start Date End Date Taking? Authorizing Provider    acetaminophen (TYLENOL) 500 MG tablet Take 500 mg by mouth every 4 (four) hours as needed for mild pain or fever.    [provider]  acetaminophen (TYLENOL) 650 MG CR tablet Take 650 mg by mouth 3 (three) times daily.    [provider]  acidophilus (RISAQUAD) CAPS capsule Take by mouth daily.    [provider]  alum & mag hydroxide-simeth (MINTOX) 200-200-20 MG/5ML suspension Take 30 mLs by mouth at bedtime as needed for indigestion or heartburn.    [provider]  apixaban (ELIQUIS) 5 MG TABS tablet Take 1 tablet (5 mg total) by mouth 2 (two) times daily. 07/03/19   Wellington Hampshire, MD  carbamide peroxide (GLY-OXIDE) 10 % solution Place 1 application onto teeth 4 (four) times daily -  with meals and at bedtime. (as needed for dry mouth)    [provider]  carvedilol (COREG) 12.5 MG tablet Take 1.5 tablets (18.75 mg total) by mouth 2 (two) times daily. 02/22/18 09/19/27  Theora Gianotti, NP  Cholecalciferol 2000 UNITS TABS Take 2,000 Units by mouth daily.     Roselee Nova, MD  conjugated estrogens (PREMARIN) vaginal cream Apply 0.93m (pea-sized amount)  just inside the vaginal introitus  with a finger-tip on  Monday, Wednesday and Friday nights. 04/15/19   Zara Council A, PA-C  Cyanocobalamin (VITAMIN DEFICIENCY SYSTEM-B12) 1000 MCG/ML KIT Inject 1,000 mcg as directed every 30 (thirty) days.     [provider]  diclofenac sodium (VOLTAREN) 1 % GEL Apply 2 g topically 2 (two) times daily as needed (knee pain).    [provider]  docusate sodium (COLACE) 100 MG capsule Take 100 mg by mouth at bedtime.     [provider]  fluticasone (FLONASE) 50 MCG/ACT nasal spray Place 1 spray into both nostrils daily. 08/30/17   Wilhelmina Mcardle, MD  Fluticasone Furoate (ARNUITY ELLIPTA) 100 MCG/ACT AEPB Inhale 1 puff into the lungs daily. 08/30/17   Wilhelmina Mcardle, MD  furosemide (LASIX) 20 MG tablet Take 2 tablets  (40MG) by mouth daily in the morning. 07/23/19   Theora Gianotti, NP  guaiFENesin (MUCINEX) 600 MG 12 hr tablet Take 600 mg by mouth 2 (two) times daily as needed for cough or to loosen phlegm.     [provider]  guaiFENesin (ROBITUSSIN) 100 MG/5ML SOLN Take 10 mLs by mouth every 6 (six) hours as needed for cough or to loosen phlegm.    [provider]  lidocaine (LMX) 4 % cream Apply 1 application topically at bedtime.    [provider]  Lidocaine-Glycerin (PREPARATION H EX) Apply topically daily as needed.    [provider]  loperamide (IMODIUM A-D) 2 MG tablet Take 2 mg by mouth as needed for diarrhea or loose stools (max 4 doses daily).     [provider]  loratadine (CLARITIN) 10 MG tablet Take 10 mg by mouth daily.    [provider]  LORazepam (ATIVAN) 0.5 MG tablet Take 1 tablet (0.5 mg total) by mouth 2 (two) times daily as needed for anxiety. Patient taking differently: Take 0.5 mg by mouth 2 (two) times daily.  05/23/17   Roselee Nova, MD  losartan (COZAAR) 100 MG tablet Take 1 tablet (100 mg total) by mouth daily. 05/23/17   Roselee Nova, MD  lovastatin (MEVACOR) 40 MG tablet Take 1 tablet (40 mg total) by mouth daily. Patient taking differently: Take 40 mg by mouth every Monday, Wednesday, and Friday.  05/23/17   Roselee Nova, MD  Lutein 10 MG TABS Take 10 mg by mouth 2 (two) times daily.    [provider]  magnesium hydroxide (MILK OF MAGNESIA) 400 MG/5ML suspension Take 30 mLs by mouth at bedtime as needed for mild constipation.    [provider]  mirabegron ER (MYRBETRIQ) 25 MG TB24 tablet Take 25 mg by mouth daily.    [provider]  neomycin-bacitracin-polymyxin (NEOSPORIN) [phone] ointment Apply topically 4 (four) times daily as needed (wound care).    [provider]  polyethylene glycol (MIRALAX / GLYCOLAX) 17 g packet Take 17 g by mouth daily.     [provider]  Saccharomyces boulardii (PROBIOTIC) 250 MG CAPS Take 1 capsule by mouth daily.     [provider]  traMADol (ULTRAM) 50 MG tablet Take 50 mg by mouth daily.     [provider]  triamcinolone (KENALOG) 0.025 % ointment Apply 1 application topically as needed.    [provider]  trimethoprim (TRIMPEX) 100 MG tablet Take 1 tablet (100 mg total) by mouth daily. 03/04/18   Bjorn Loser, MD    Review of Systems    Increasing dyspnea  on exertion, lower extremity swelling, abdominal girth, and weight gain. Speech is labored today.    Physical Exam    Vital Signs:  BP (!) 204/84   Ht '5\' 6"'  (1.676 m)   Wt 147 lb 5 oz (66.8 kg)   BMI 23.78 kg/m    Phone visit limiting examination. Speech is labored. She is awake alert and oriented x3.  Accessory Clinical Findings    None  Assessment & Plan    1. Acute on chronic heart failure with improved ejection fraction: EF previously as low as 40-45% May 2017 however, this improved to 60-65% by echo in July 2020. She has been on Lasix 40 mg daily however, over the past month, she has noted increasing lower extremity swelling, dyspnea on exertion, increasing abdominal girth, weight gain, and elevated blood pressures. Speech was labored today in the setting of this phone visit. I have recommended that she be formally evaluated today either by housestaff in the emergency room. She does not think housestaff is an option. Our nursing staff has contacted the nursing staff at the assisted living facility with recommendation for ER evaluation.  2. Persistent atrial fibrillation/flutter: Previously rate controlled on beta-blocker therapy. No heart rate reported today. She is anticoagulated with Eliquis and otherwise on beta-blocker therapy.  3. Essential hypertension: Blood pressure markedly elevated today. She says it has been this way for the past month. As above, in the setting of acute symptoms, I recommended ER  evaluation.  4. Disposition: Patient referred to the emergency department for evaluation the setting of acute decompensation.  COVID-19 Education: The signs and symptoms of COVID-19 were discussed with the patient and how to seek care for testing (follow up with PCP or arrange E-visit).  The importance of social distancing was discussed today.  Patient Risk:   After full review of this patient's history and clinical status, I feel that he is at least moderate risk for cardiac complications at this time, thus necessitating a telehealth visit sooner than our first available in office visit.  Time:   Today, I have spent 15 minutes with the patient with telehealth technology discussing medical history, symptoms, and management plan.     Murray Hodgkins, NP 01/08/2020, 8:47 AM

## 2020-01-08 NOTE — Patient Instructions (Signed)
Our recommendation is that you be evaluated in the ED for shortness of breath and hypertension today. I made call to [facility] staff, spoke to [place] in reception who verbalized understanding of need for patient to be urgently evaluated. She is making the med tech aware at this time.   Medication Instructions:  Your physician recommends that you continue on your current medications as directed. Please refer to the Current Medication list given to you today.  *If you need a refill on your cardiac medications before your next appointment, please call your pharmacy*   Lab Work: None ordered If you have labs (blood work) drawn today and your tests are completely normal, you will receive your results only by: Marland Kitchen MyChart Message (if you have MyChart) OR . A paper copy in the mail If you have any lab test that is abnormal or we need to change your treatment, we will call you to review the results.   Testing/Procedures: None ordered   Follow-Up: At [facility], you and your health needs are our priority.  As part of our continuing mission to provide you with exceptional heart care, we have created designated Provider Care Teams.  These Care Teams include your primary Cardiologist (physician) and Advanced Practice Providers (APPs -  Physician Assistants and Nurse Practitioners) who all work together to provide you with the care you need, when you need it.  We recommend signing up for the patient portal called "MyChart".  Sign up information is provided on this After Visit Summary.  MyChart is used to connect with patients for Virtual Visits (Telemedicine).  Patients are able to view lab/test results, encounter notes, upcoming appointments, etc.  Non-urgent messages can be sent to your provider as well.   To learn more about what you can do with MyChart, go to [email or web address].    Your next appointment:   1 month(s)  The format for your next appointment:   In Person  Provider:     You may see Kathlyn Sacramento, MD or Murray Hodgkins, NP

## 2020-01-13 NOTE — Telephone Encounter (Signed)
Call to patient to check in since I see that she did not go to ED after virtual appt last week. Pt reports that she could not go to ED bc she wouldn't have anyone to take care of her husband.   She reports continue SOB with exertion but improves with rest. She does sound less strained today.   BP yesterday 199/80. She reports that they have a nurse there at [facility] now. They have dec Lasix to 40 mg daily.   Weight today 147.5lbs.

## 2020-01-25 ENCOUNTER — Other Ambulatory Visit: Payer: Self-pay | Admitting: Cardiovascular Disease

## 2020-01-26 ENCOUNTER — Other Ambulatory Visit: Payer: Self-pay

## 2020-01-26 NOTE — Telephone Encounter (Signed)
Please review for refill. Thanks!  

## 2020-01-28 ENCOUNTER — Telehealth: Payer: Self-pay | Admitting: Cardiovascular Disease

## 2020-01-28 NOTE — Telephone Encounter (Addendum)
Spoke with the patient. Patient was not taken to the ED as recommended after her 01/08/20 telemedicine visit with Ignacia Bayley, NP. She did not want to leave her husband alone at their facility.  She is reporting a 7-8lb weight gain since the beginning of the month. She is taking her lasix 40 mg qd. She is not sure if she is being given her other medications as they are prescribed.  She has tried to make dietary changes to limit her sodium intake, even though her meals are prepared in the dining hall. Her weight is down 3lbs overnight. Her sob is a little better today, pt sts that she has felt poorly for the last couple of weeks. 9/13 153lbs 9/14 155lbs 9/15 152lbs. Her most recent BP reading taken yesterday 151/66. Patient sts that the previous elevated readings were when her BP was checked using a machine. When the nurse checks her BP manually her systolic is closer to the 140/150's. Offered to move up her 9/30 appt. Unfortunately her facility is on lock down again, she is not sure for how long.  She has several complaints about the care her and her husband are receiving. Patient was tearful during the call. comforrting words given.  Adv the patient that I will fwd the update to Ignacia Bayley, NP and call back with his recommendation.

## 2020-01-28 NOTE — Telephone Encounter (Signed)
Pt c/o swelling: STAT is pt has developed SOB within 24 hours  1) How much weight have you gained and in what time span? 6-8 pounds since first of month   2) If swelling, where is the swelling located? Abdomen   3) Are you currently taking a fluid pill? yes  4) Are you currently SOB? yes  5) Do you have a log of your daily weights (if so, list)? yes  6) Have you gained 3 pounds in a day or 5 pounds in a week?   Not sure   7) Have you traveled recently? No

## 2020-01-29 ENCOUNTER — Telehealth: Payer: Self-pay | Admitting: Nurse Practitioner

## 2020-01-29 NOTE — Telephone Encounter (Signed)
error 

## 2020-01-29 NOTE — Telephone Encounter (Signed)
Can she pls take lasix 40 bid (PM dose no later than 3pm) for the next 3 days and then reduce back to 40 daily.  Ideally, she should come to be seen in person within the next week, but if for some reason she is not able to leave her facility, she should at least be evaluated by house staff within the next week w/ a bmet drawn.

## 2020-01-30 ENCOUNTER — Telehealth: Payer: Self-pay | Admitting: Nurse Practitioner

## 2020-01-30 NOTE — Telephone Encounter (Signed)
error 

## 2020-01-30 NOTE — Telephone Encounter (Signed)
If wt is coming down, ok to cont lasix 40mg  daily w/ plan to take additional 40mg  in the PM for wt gain of > 2 lbs in 24 hrs or 5 lbs over the course of 5 days.

## 2020-01-30 NOTE — Telephone Encounter (Signed)
Called the patient to give Lucillie Garfinkel response and recommendation. Patient sts that her weight is down 3 lbs since 01/28/20. Patients weight this morning 149lbs. Patient sts that her baseline weight 147-148lbs. She reports improvement in her sob and her LE swelling.  She contributes her weight loss to her dietary change and trying to limit her sodium intake.  Advised the patient that I will fwd the update to Ignacia Bayley, NP to see if the recommended change should still be implemented. Adv the patient that I will call back with his response.

## 2020-02-02 NOTE — Telephone Encounter (Signed)
Called to give the patient Ignacia Bayley, NP recommendation. lmtcb.

## 2020-02-02 NOTE — Telephone Encounter (Signed)
Patient returning call.

## 2020-02-02 NOTE — Telephone Encounter (Signed)
Returned the patients call. lmtcb. 

## 2020-02-03 MED ORDER — FUROSEMIDE 20 MG PO TABS: ORAL_TABLET | ORAL | 3 refills | Status: DC

## 2020-02-03 NOTE — Telephone Encounter (Signed)
Patient verbalized understanding of the recommendations of furosemide. States her weight is 147 lb this morning and she is pleased with that. Med list instructions updated.

## 2020-02-12 ENCOUNTER — Ambulatory Visit (INDEPENDENT_AMBULATORY_CARE_PROVIDER_SITE_OTHER): Payer: Medicare Other | Admitting: Nurse Practitioner

## 2020-02-12 ENCOUNTER — Other Ambulatory Visit: Payer: Self-pay

## 2020-02-12 ENCOUNTER — Encounter: Payer: Self-pay | Admitting: Nurse Practitioner

## 2020-02-12 VITALS — BP 148/80 | HR 49 | Ht 66.0 in | Wt 152.5 lb

## 2020-02-12 DIAGNOSIS — I4821 Permanent atrial fibrillation: Secondary | ICD-10-CM | POA: Diagnosis not present

## 2020-02-12 DIAGNOSIS — I10 Essential (primary) hypertension: Secondary | ICD-10-CM

## 2020-02-12 DIAGNOSIS — I5033 Acute on chronic diastolic (congestive) heart failure: Secondary | ICD-10-CM | POA: Diagnosis not present

## 2020-02-12 DIAGNOSIS — E782 Mixed hyperlipidemia: Secondary | ICD-10-CM

## 2020-02-12 NOTE — Patient Instructions (Signed)
Medication Instructions:  - Your physician has recommended you make the following change in your medication:   1) Decrease coreg (carvedilol) to 12.5 mg- take 1 tablet by mouth twice daily   *If you need a refill on your cardiac medications before your next appointment, please call your pharmacy*   Lab Work: - none ordered  If you have labs (blood work) drawn today and your tests are completely normal, you will receive your results only by: Marland Kitchen MyChart Message (if you have MyChart) OR . A paper copy in the mail If you have any lab test that is abnormal or we need to change your treatment, we will call you to review the results.   Testing/Procedures: - none ordered   Follow-Up: At [facility] you and your health needs are our priority.  As part of our continuing mission to provide you with exceptional heart care, we have created designated Provider Care Teams.  These Care Teams include your primary Cardiologist (physician) and Advanced Practice Providers (APPs -  Physician Assistants and Nurse Practitioners) who all work together to provide you with the care you need, when you need it.  We recommend signing up for the patient portal called "MyChart".  Sign up information is provided on this After Visit Summary.  MyChart is used to connect with patients for Virtual Visits (Telemedicine).  Patients are able to view lab/test results, encounter notes, upcoming appointments, etc.  Non-urgent messages can be sent to your provider as well.   To learn more about what you can do with MyChart, go to [email or web address].    Your next appointment:   3 month(s)  The format for your next appointment:   In Person  Provider:   Kathlyn Sacramento, MD or Ignacia Bayley, NP   Other Instructions n/a

## 2020-02-12 NOTE — Progress Notes (Signed)
Office Visit    Patient Name: Jodi Bullock Date of Encounter: 02/12/2020  Primary Care Provider:  [facility] Primary Cardiologist:  Kathlyn Sacramento, MD  Chief Complaint    86 year old female with a history of nonischemic cardiomyopathy with subsequent improvement in LV function, chronic heart failure with improved EF, hypertension, hyperlipidemia, thymus cancer, right breast cancer, COVID-19 (January 2021), and permanent atrial fibrillation/flutter, who presents for follow-up of heart failure.  Past Medical History    Past Medical History:  Diagnosis Date  . Arthritis   . Atrial flutter ([place])    a. Dx 07/2018. CHA2DS2VASc = 5-->Eliquis.  . Bladder incontinence   . Breast cancer ([place]) 1998  . Chronic heart failure with improved ejection fraction (HFimpEF) 01/10/2016   a. 09/2015 Echo: EF 40-45%, possible inf HK, mod PAH (PASP 9mHg); b. 11/2018 Echo: EF 60-65%, RVSP 625mg. Mod dil LA, mildly dil RA. Mod MR.  . [place] of right [place] ([facility]   a. 08/2017->Managed conservatively.  . Marland KitchenOVID-19 virus infection    a. 05/2019 - asymptomatic.  . Marland Kitchenepression   . GERD (gastroesophageal reflux disease)   . History of kidney stones   . HLD (hyperlipidemia)   . Hypertension   . Neuropathy of both feet   . N[place] ([place]ischemic cardiomyopathy) ([facility]   a. 08/2015 Lexiscan MV: EF 39%, no ischemia; b. 09/2015 Echo: EF 40-45%.  . [place] fibrillation [place] [place]  . Right Humerus head fracture    a. 07/2016 - managed conservatively.  . 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   Past Surgical History:  Procedure Laterality Date  . ABDOMINAL HYSTERECTOMY    . BREAST LUMPECTOMY    . CATARACT EXTRACTION    . CHOLECYSTECTOMY    . JOINT REPLACEMENT Right    Hip  . KNEE SURGERY Right     Allergies  Allergies  Allergen Reactions  . 2,4-D Dimethylamine (Amisol) Other (See Comments)    Other Reaction: OTHER REACTION-IRRITATION TO Esophagous  . Celebrex [Celecoxib] Other (See  Comments)     esophagitis Esophageal spasms    . Ciprofloxacin Other (See Comments)    Esophageal irritation  . Ibuprofen Other (See Comments)    Other reaction(s): Other (See Comments) reflux  . Levamisole Other (See Comments) and Nausea And Vomiting  . Metronidazole Other (See Comments)    neuropathy  . Naproxen Other (See Comments)    GI UPSET  . Other Other (See Comments)    Other Reaction: esophagitis    History of Present Illness    [age]ear old female with the above complex past medical history including hypertension, permanent atrial fibrillation/flutter, hyperlipidemia, GERD, nonischemic cardiomyopathy, heart failure with improved EF, thymus cancer, and remote breast cancer.  Cardiac history dates back to April 2017, when she was evaluated for exertional dyspnea.  Stress testing showed an EF of 39% without ischemia.  Follow-up echo showed an EF of 45-50% with possible inferior hypokinesis.  She has been medically managed.  In March 2020, she was diagnosed with atrial flutter, which has been permanent and asymptomatic.  She has been anticoagulated with Eliquis.  Most recent echocardiogram July 2020 showed an EF of 60-65% with an RVSP of 61 mmHg.  January 2021, she was diagnosed with COVID-19.  She was relatively asymptomatic and did not require hospitalization however, her husband was hospitalized for 4 days and then sent to rehab for 2 weeks.    At her most recent telemedicine visit August 26, she complained of increasing  lower extremity swelling, increasing dyspnea on exertion, increased abdominal bloating, chest wall tenderness, and elevated blood pressures, frequently in the 200 range.  Recommendation was made for ED evaluation however, she had concerns about leaving her husband alone and instead agreed to see house staff at the living facility.  She subsequently noted improvement in weight on Lasix 40 mg daily.  Over the past week or so however, weight has climbed again to 148 pounds  on her home scale.  With this, she has noticed some increase in dyspnea on exertion.  Blood pressures at home have been trending in the 140s to 160s for the most part.  She does have right greater than left lower extremity swelling which she notes is largely dependent in nature and not mostly present first thing in the morning.  She has been compliant with Lasix 40 mg daily but notes that sometimes her other medications are not provided for her at her living facility.  She denies chest pain, palpitations, PND, orthopnea, dizziness, syncope, or early satiety.  She continues to assist in care for her 23 year old husband, who has been relatively stable.  Home Medications    Prior to Admission medications   Medication Sig Start Date End Date Taking? Authorizing Provider  acetaminophen (TYLENOL) 500 MG tablet Take 500 mg by mouth every 4 (four) hours as needed for mild pain or fever.    [provider]  acetaminophen (TYLENOL) 650 MG CR tablet Take 650 mg by mouth 3 (three) times daily.    [provider]  acidophilus (RISAQUAD) CAPS capsule Take by mouth daily.    [provider]  alum & mag hydroxide-simeth (MINTOX) 200-200-20 MG/5ML suspension Take 30 mLs by mouth at bedtime as needed for indigestion or heartburn.    [provider]  carbamide peroxide (GLY-OXIDE) 10 % solution Place 1 application onto teeth 4 (four) times daily -  with meals and at bedtime. (as needed for dry mouth)    [provider]  carvedilol (COREG) 12.5 MG tablet Take 1.5 tablets (18.75 mg total) by mouth 2 (two) times daily. 02/22/18 09/19/27  Theora Gianotti, NP  Cholecalciferol 2000 UNITS TABS Take 2,000 Units by mouth daily.     Roselee Nova, MD  conjugated estrogens (PREMARIN) vaginal cream Apply 0.59m (pea-sized amount)  just inside the vaginal introitus with a finger-tip on  Monday, Wednesday and Friday nights. 04/15/19   MZara CouncilA, PA-C  Cyanocobalamin  (VITAMIN DEFICIENCY SYSTEM-B12) 1000 MCG/ML KIT Inject 1,000 mcg as directed every 30 (thirty) days.     [provider]  diclofenac sodium (VOLTAREN) 1 % GEL Apply 2 g topically 2 (two) times daily as needed (knee pain).    [provider]  docusate sodium (COLACE) 100 MG capsule Take 100 mg by mouth at bedtime.     [provider]  ELIQUIS 5 MG TABS tablet TAKE 1 TABLET(5 MG) BY MOUTH TWICE DAILY 01/26/20   AWellington Hampshire MD  fluticasone (FLONASE) 50 MCG/ACT nasal spray Place 1 spray into both nostrils daily. 08/30/17   SWilhelmina Mcardle MD  Fluticasone Furoate (ARNUITY ELLIPTA) 100 MCG/ACT AEPB Inhale 1 puff into the lungs daily. 08/30/17   SWilhelmina Mcardle MD  furosemide (LASIX) 20 MG tablet Take 2 tablets (40MG) by mouth daily in the morning. May take additional 40 mg in the afternoon for weight gain >2 lb in 24 hours or 5 lbs over the course of 5 days. 02/03/20   BMurray Hodgkins  Jori Moll, NP  guaiFENesin (MUCINEX) 600 MG 12 hr tablet Take 600 mg by mouth 2 (two) times daily as needed for cough or to loosen phlegm.     [provider]  guaiFENesin (ROBITUSSIN) 100 MG/5ML SOLN Take 10 mLs by mouth every 6 (six) hours as needed for cough or to loosen phlegm.    [provider]  lidocaine (LMX) 4 % cream Apply 1 application topically at bedtime.    [provider]  Lidocaine-Glycerin (PREPARATION H EX) Apply topically daily as needed.    [provider]  loperamide (IMODIUM A-D) 2 MG tablet Take 2 mg by mouth as needed for diarrhea or loose stools (max 4 doses daily).     [provider]  loratadine (CLARITIN) 10 MG tablet Take 10 mg by mouth daily.    [provider]  LORazepam (ATIVAN) 0.5 MG tablet Take 1 tablet (0.5 mg total) by mouth 2 (two) times daily as needed for anxiety. Patient taking differently: Take 0.5 mg by mouth 2 (two) times daily.  05/23/17   Roselee Nova, MD  losartan (COZAAR) 100 MG tablet Take  1 tablet (100 mg total) by mouth daily. 05/23/17   Roselee Nova, MD  lovastatin (MEVACOR) 40 MG tablet Take 1 tablet (40 mg total) by mouth daily. Patient taking differently: Take 40 mg by mouth every Monday, Wednesday, and Friday.  05/23/17   Roselee Nova, MD  Lutein 10 MG TABS Take 10 mg by mouth 2 (two) times daily.    [provider]  magnesium hydroxide (MILK OF MAGNESIA) 400 MG/5ML suspension Take 30 mLs by mouth at bedtime as needed for mild constipation.    [provider]  mirabegron ER (MYRBETRIQ) 25 MG TB24 tablet Take 25 mg by mouth daily.    [provider]  neomycin-bacitracin-polymyxin (NEOSPORIN) [phone] ointment Apply topically 4 (four) times daily as needed (wound care).    [provider]  polyethylene glycol (MIRALAX / GLYCOLAX) 17 g packet Take 17 g by mouth daily.     [provider]  Saccharomyces boulardii (PROBIOTIC) 250 MG CAPS Take 1 capsule by mouth daily.     [provider]  traMADol (ULTRAM) 50 MG tablet Take 50 mg by mouth daily.     [provider]  triamcinolone (KENALOG) 0.025 % ointment Apply 1 application topically as needed.    [provider]  trimethoprim (TRIMPEX) 100 MG tablet Take 1 tablet (100 mg total) by mouth daily. 03/04/18   Bjorn Loser, MD    Review of Systems    Ongoing variability in weight and blood pressures with intermittent lower extremity swelling.  She has been having dyspnea on exertion.  She denies chest pain, palpitations, PND, orthopnea, dizziness, syncope, or early satiety.  All other systems reviewed and are otherwise negative except as noted above.  Physical Exam    VS:  BP (!) 148/80 (BP Location: Right Arm, Patient Position: Sitting, Cuff Size: Normal)   Pulse (!) 49   Ht _0  (1.676 m)   Wt 152 lb 8 oz (69.2 kg)   SpO2 97%   BMI 24.61 kg/m  , BMI Body mass index is 24.61 kg/m. GEN: Well nourished, well developed, in no acute  distress. HEENT: normal. Neck: Supple, no JVD, carotid bruits, or masses. Cardiac: Irregularly, irregular, bradycardic, no murmurs, rubs, or gallops. No clubbing, cyanosis, 2+ right lower extremity and 1+ left lower extremity edema.  PT 2+ and equal bilaterally.  Respiratory:  Respirations regular and unlabored, clear to auscultation bilaterally. GI: Soft, nontender, nondistended, BS + x 4. MS: no deformity or atrophy. Skin: warm and dry, no rash. Neuro:  Strength and sensation are intact. Psych: Normal affect.  Accessory Clinical Findings    ECG personally reviewed by me today -atrial fibrillation, 49, PVC, delayed R wave progression- no acute changes.  Lab Results  Component Value Date   WBC 7.3 03/15/2019   HGB 14.3 03/15/2019   HCT 45.4 03/15/2019   MCV 91.9 03/15/2019   PLT 151 03/15/2019   Lab Results  Component Value Date   CREATININE 0.99 11/28/2019   BUN 21 11/28/2019   NA 143 11/28/2019   K 4.5 11/28/2019   CL 105 11/28/2019   CO2 27 11/28/2019   Lab Results  Component Value Date   ALT 7 03/15/2019   AST 23 03/15/2019   ALKPHOS 62 03/15/2019   BILITOT 1.0 03/15/2019   Lab Results  Component Value Date   CHOL 139 05/24/2017   HDL 48 (L) 05/24/2017   LDLCALC 67 05/24/2017   TRIG 163 (H) 05/24/2017   CHOLHDL 2.9 05/24/2017    Lab Results  Component Value Date   HGBA1C 5.4 02/14/2016    Assessment & Plan    1.  Acute on chronic heart failure with improved ejection fraction: EF 60-65% by echo in June 2020.  She has had variable response to Lasix therapy as an outpatient and most recently has been taking 40 mg daily.  She recognizes her dry weight at 144 pounds but over the past week, has been trending between 146 and 148.  With this, she has an increase in dyspnea on exertion and also right leg and left lower extremity swelling.  Blood pressures have also been trending in the 140-160 range.  Heart rates are in the 40s today in slow A. fib.  Continue Lasix 40  mg daily with plan to take an additional 40 mg when weight is greater than 146 pounds.  I am reducing her carvedilol to 12.5 mg twice daily in the setting of bradycardia, which may be contributing to fatigue.  2.  Permanent atrial fibrillation with slow ventricular response: I am going to reduce her carvedilol from 18.75 mg twice daily to 12.5 mg twice daily.  This may require further adjustment based on heart rates at home, which she does monitor.  She is supposed anticoagulated with Eliquis but notes that the staff at her facility does not always give it and she believes that she has not had any in the past 5 days.  I did stress the importance of proper administration and compliance with Eliquis in paperwork to the facility today.  3.  Essential hypertension: She checks blood pressure regularly and this has been trending in the 140s to 160s for the most part recently.  With reduction of carvedilol therapy, she may require an additional agent.  With chronic, mild lower extremity swelling, I do not think she would do well with amlodipine therefore, if an additional agent is required, hydralazine 10 mg 3 times daily is most likely appropriate for starters.  She is otherwise on losartan 100 mg daily and furosemide 40 mg daily.  4.  Hyperlipidemia: Continue statin therapy.  5.  Disposition: Reducing carvedilol as above.  She is aware that if she is taking additional Lasix on a regular basis, she will need to contact us or her providers at her living facility for lab work to ensure stability of renal  function electrolytes.  Otherwise follow-up here in 3 months.   Murray Hodgkins, NP 02/12/2020, 5:17 PM

## 2020-03-02 ENCOUNTER — Ambulatory Visit: Payer: Medicare Other | Admitting: Cardiovascular Disease

## 2020-03-17 ENCOUNTER — Emergency Department: Payer: Medicare Other

## 2020-03-17 ENCOUNTER — Emergency Department
Admission: EM | Admit: 2020-03-17 | Discharge: 2020-03-17 | Disposition: A | Discharge status: Home / Self Care | Payer: Medicare Other | Attending: Emergency Medicine | Admitting: Emergency Medicine

## 2020-03-17 ENCOUNTER — Other Ambulatory Visit: Payer: Self-pay

## 2020-03-17 DIAGNOSIS — I5020 Unspecified systolic (congestive) heart failure: Secondary | ICD-10-CM | POA: Insufficient documentation

## 2020-03-17 DIAGNOSIS — M5440 Lumbago with sciatica, unspecified side: Secondary | ICD-10-CM

## 2020-03-17 DIAGNOSIS — R1030 Lower abdominal pain, unspecified: Secondary | ICD-10-CM | POA: Diagnosis not present

## 2020-03-17 DIAGNOSIS — I110 Hypertensive heart disease with heart failure: Secondary | ICD-10-CM | POA: Insufficient documentation

## 2020-03-17 DIAGNOSIS — Z87891 Personal history of nicotine dependence: Secondary | ICD-10-CM | POA: Insufficient documentation

## 2020-03-17 DIAGNOSIS — I4892 Unspecified atrial flutter: Secondary | ICD-10-CM | POA: Insufficient documentation

## 2020-03-17 DIAGNOSIS — Z85238 Personal history of other malignant neoplasm of thymus: Secondary | ICD-10-CM | POA: Insufficient documentation

## 2020-03-17 DIAGNOSIS — Z7901 Long term (current) use of anticoagulants: Secondary | ICD-10-CM | POA: Diagnosis not present

## 2020-03-17 DIAGNOSIS — M5442 Lumbago with sciatica, left side: Secondary | ICD-10-CM | POA: Diagnosis not present

## 2020-03-17 DIAGNOSIS — Z8616 Personal history of COVID-19: Secondary | ICD-10-CM | POA: Diagnosis not present

## 2020-03-17 DIAGNOSIS — Z96649 Presence of unspecified artificial hip joint: Secondary | ICD-10-CM | POA: Diagnosis not present

## 2020-03-17 LAB — URINALYSIS, COMPLETE (UACMP) WITH MICROSCOPIC
Bacteria, UA: NONE SEEN
Bilirubin Urine: NEGATIVE
Glucose, UA: NEGATIVE mg/dL
Hgb urine dipstick: NEGATIVE
Ketones, ur: NEGATIVE mg/dL
Leukocytes,Ua: NEGATIVE
Nitrite: NEGATIVE
Protein, ur: NEGATIVE mg/dL
Specific Gravity, Urine: 1.005 (ref 1.005–1.030)
Squamous Epithelial / HPF: NONE SEEN (ref 0–5)
pH: 7 (ref 5.0–8.0)

## 2020-03-17 LAB — COMPREHENSIVE METABOLIC PANEL
ALT: 14 U/L (ref 0–44)
AST: 23 U/L (ref 15–41)
Albumin: 4.1 g/dL (ref 3.5–5.0)
Alkaline Phosphatase: 49 U/L (ref 38–126)
Anion gap: 8 (ref 5–15)
BUN: 16 mg/dL (ref 8–23)
CO2: 27 mmol/L (ref 22–32)
Calcium: 9.4 mg/dL (ref 8.9–10.3)
Chloride: 107 mmol/L (ref 98–111)
Creatinine, Ser: 0.71 mg/dL (ref 0.44–1.00)
GFR, Estimated: >60 mL/min (ref >60)
Glucose, Bld: 103 mg/dL — ABNORMAL HIGH (ref 70–99)
Potassium: 4.6 mmol/L (ref 3.5–5.1)
Sodium: 142 mmol/L (ref 135–145)
Total Bilirubin: 1.3 mg/dL — ABNORMAL HIGH (ref 0.3–1.2)
Total Protein: 6.9 g/dL (ref 6.5–8.1)

## 2020-03-17 LAB — CBC
HCT: 43.9 % (ref 36.0–46.0)
Hemoglobin: 14.2 g/dL (ref 12.0–15.0)
MCH: 31.5 pg (ref 26.0–34.0)
MCHC: 32.3 g/dL (ref 30.0–36.0)
MCV: 97.3 fL (ref 80.0–100.0)
Platelets: 152 10*3/uL (ref 150–400)
RBC: 4.51 MIL/uL (ref 3.87–5.11)
RDW: 13.2 % (ref 11.5–15.5)
WBC: 9.1 10*3/uL (ref 4.0–10.5)
nRBC: 0 % (ref 0.0–0.2)

## 2020-03-17 LAB — LIPASE, BLOOD: Lipase: 26 U/L (ref 11–51)

## 2020-03-17 MED ORDER — LIDOCAINE 5 % EX PTCH
1.0000 | MEDICATED_PATCH | CUTANEOUS | Status: DC
  Administered 2020-03-17: 1 via TRANSDERMAL
  Filled 2020-03-17: qty 1

## 2020-03-17 MED ORDER — IOHEXOL 300 MG/ML  SOLN
75.0000 mL | Freq: Once | INTRAMUSCULAR | Status: AC | PRN
  Administered 2020-03-17: 75 mL via INTRAVENOUS

## 2020-03-17 MED ORDER — MORPHINE SULFATE (PF) 4 MG/ML IV SOLN
4.0000 mg | Freq: Once | INTRAVENOUS | Status: DC
  Filled 2020-03-17: qty 1

## 2020-03-17 MED ORDER — ACETAMINOPHEN 500 MG PO TABS
1000.0000 mg | ORAL_TABLET | Freq: Once | ORAL | Status: AC
  Administered 2020-03-17: 1000 mg via ORAL
  Filled 2020-03-17: qty 2

## 2020-03-17 NOTE — ED Triage Notes (Signed)
Per ems--back and abd pain for up to a week.  Brought from [facility].  Per ems ekg is irregular with ? History of this.  Patient alert and able to pivot to wheelchair.

## 2020-03-17 NOTE — ED Provider Notes (Signed)
I assumed care of this patient approximately 1500.  Please see outgoing providers note for full details.  Patient's initial evaluation assessment.  In brief patient presents with acute on chronic left hip/left lower quadrant abdominal pain with no recent falls or injuries.  UA is reassuring and not suggestive of urine tract infection.  CBC and CMP unremarkable.  Plan is to follow-up CT abdomen pelvis.  If is unremarkable plan is to discharge with close PCP follow-up.  CT shows bilateral kidney stones without any obstruction stones in the ureter. UA is not infected. Given otherwise reassuring exam and vital signs and work-up with patient on my assessment describing her pain is present for at least several weeks of life she is safe for discharge with plan for PCP follow-up. Below analgesia given. Patient discharged stable condition. Strict return cautions advised and discussed.  Medications  morphine 4 MG/ML injection 4 mg (has no administration in time range)  acetaminophen (TYLENOL) tablet 1,000 mg (has no administration in time range)  lidocaine (LIDODERM) 5 % 1 patch (has no administration in time range)  iohexol (OMNIPAQUE) 300 MG/ML solution 75 mL (75 mLs Intravenous Contrast Given 03/17/20 1529)      Lucrezia Starch, MD 03/17/20 1600

## 2020-03-17 NOTE — ED Notes (Signed)
Patient left with Tiffany from [id number].

## 2020-03-17 NOTE — ED Triage Notes (Signed)
Pt to ED EMS c/o back pain for "long time" and left hip for 2 weeks. Denies injury. C/o LLQ pain for "awhile" Reports BM 2 days ago.  Pt appears uncomfortable in triage

## 2020-03-17 NOTE — ED Triage Notes (Signed)
Pt comes into the ED via [facility] from [facility] c/o back and abdominal pain x1 week.  Facility states she hasn't had a BM in 2 days and have been giving suppositories, mil of mag, laxatives.  VSS with EMS.  Pt in a-fib, but has h/o of it. Denies any nausea or vomiting. 20 R AC. CBG 164, 184/96, 98% RA, 84-110 HR.

## 2020-03-17 NOTE — ED Notes (Signed)
Pt assisted to the restroom, but missed the hat that was placed in the toilet.  Pt aware we need another UA sample.

## 2020-03-17 NOTE — ED Notes (Signed)
Spoke to [employer], who stated that [place] their transporter will come to get her. Unsure of ETA and will call back.

## 2020-03-17 NOTE — ED Provider Notes (Signed)
[facility] Emergency Department Provider Note   ____________________________________________   First MD Initiated Contact with Patient 03/17/20 1357     (approximate)  I have reviewed the triage vital signs and the nursing notes.   HISTORY  Chief Complaint Back Pain and Abdominal Pain    HPI Dalina Samara is a 86 y.o. female with past medical history of hypertension, hyperlipidemia, atrial flutter on Eliquis, CHF, and chronic pain who presents to the ED complaining of back and abdominal pain.  Patient reports 1 week of pain in her lower abdomen, particularly on the left side.  Pain is constant and aching, seem to be worse while she was straining for a bowel movement.  This is associated with some pain in her lower back, which she states has been going on for a long time.  She denies any recent falls or new injuries to her hip or back.  She has not had any fevers, cough, chest pain, shortness of breath.  She reports some urinary frequency but denies any dysuria or hematuria.        Past Medical History:  Diagnosis Date   Arthritis    Atrial flutter ([place])    a. Dx 07/2018. CHA2DS2VASc = 5-->Eliquis.   Bladder incontinence    Breast cancer ([place]) 1998   Chronic heart failure with improved ejection fraction (HFimpEF) 01/10/2016   a. 09/2015 Echo: EF 40-45%, possible inf HK, mod PAH (PASP 60mHg); b. 11/2018 Echo: EF 60-65%, RVSP 624mg. Mod dil LA, mildly dil RA. Mod MR.   Closed fracture of right femur ([facility]   a. 08/2017->Managed conservatively.   COVID-19 virus infection    a. 05/2019 - asymptomatic.   Depression    GERD (gastroesophageal reflux disease)    History of kidney stones    HLD (hyperlipidemia)    Hypertension    Neuropathy of both feet    NICM (nonischemic cardiomyopathy) ([place]   a. 08/2015 Lexiscan MV: EF 39%, no ischemia; b. 09/2015 Echo: EF 40-45%.   Persistent atrial fibrillation (HCC)    Right Humerus head fracture     a. 07/2016 - managed conservatively.   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   [facility]    Patient Active Problem List   Diagnosis Date Noted   Microscopic hematuria 05/21/2019   Femur fracture ([facility]04/28/2019   Abnormal gait 12/19/2016   Acquired spondylolisthesis 12/19/2016   Breast lump 12/19/2016   Cervical spondylosis without myelopathy 12/19/2016   Closed Colles' fracture 12/19/2016   Closed fracture of base of neck of femur ([facility]08/11/2016   Closed fracture of lower end of radius and ulna 12/19/2016   Closed fracture of neck of femur ([facility]08/11/2016   Spondylolisthesis, congenital 12/19/2016   Contracture of joint of hand 12/19/2016   Contracture of wrist joint 12/19/2016   Contusion of hand 12/19/2016   Contusion of hip 12/19/2016   Contusion of knee 12/19/2016   Degeneration of intervertebral disc at C4-C5 level 12/19/2016   Degeneration of lumbar intervertebral disc 12/19/2016   Disorder of coccyx 12/19/2016   Dry eyes 12/19/2016   Enthesopathy 12/19/2016   Enthesopathy of hip region 12/19/2016   Hand joint pain 12/19/2016   Hand joint stiff 12/19/2016   Hip pain 12/19/2016   Joint pain 12/19/2016   Knee pain 12/19/2016   Localized, primary osteoarthritis 12/19/2016   Loose body in hip joint 12/19/2016   Lumbar sprain 12/19/2016   Abnormal mammogram 12/19/2016   Meibomian gland dysfunction 12/19/2016   Muscle  weakness 12/19/2016   Open fracture of lower end of forearm 12/19/2016   Presbyopia 12/19/2016   Regular astigmatism 12/19/2016   Shoulder joint pain 12/19/2016   Skin sensation disturbance 12/19/2016   Sprain of ankle 12/19/2016   Trochanteric bursitis 12/19/2016   Wrist joint pain 12/19/2016   Stiffness of wrist joint 12/19/2016   Acute bilateral low back pain without sciatica 09/29/2016   Acute bronchitis 06/19/2016   Recurrent productive cough 06/14/2016   Medicare annual wellness visit, subsequent 05/17/2016    Fall in elderly patient 01/27/2016   Sprain of wrist, right 67/04/4579   Systolic CHF with reduced left ventricular function, NYHA class 3 ([place]) 01/10/2016   Productive cough 99/83/3825   Chronic systolic heart failure ([place]) 10/12/2015   Dyspnea on exertion 08/31/2015   Fatigue 08/31/2015   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   Irregular heart rhythm 07/22/2015   Left arm pain 06/29/2015   Dyslipidemia 06/03/2015   Acute recurrent maxillary sinusitis 06/03/2015   Oral mucosal lesion 05/26/2015   Urge incontinence 02/14/2015   Ankle edema 02/02/2015   At risk for falling 01/25/2015   Dizziness 01/25/2015   Acid reflux 01/25/2015   Big thyroid 01/25/2015   Gravida 2 para 2 01/25/2015   Personal history of malignant neoplasm of breast 01/25/2015   Arthritis, degenerative 01/25/2015   Parity 2 01/25/2015   Peripheral blood vessel disorder ([place]) 01/25/2015   Need for vaccination 01/25/2015   Pain in rectum 01/25/2015   Reflux 01/25/2015   Screening for depression 01/25/2015   Disease of accessory sinus 01/25/2015   Headache, temporal 01/25/2015   Primary cancer of thymus ([place]) 01/25/2015   Disease of thyroid gland 01/25/2015   Avitaminosis D 01/25/2015   Family history of diabetes mellitus in father 12/31/2014   Fasting hyperglycemia 12/31/2014   Hypertension 12/01/2014   HLD (hyperlipidemia) 12/01/2014   Anxiety 12/01/2014   Myofascial pain 12/01/2014   Breast CA ([place]) 11/24/2014   Absence of bladder continence 11/24/2014   Urgency of micturation 11/24/2014   Common bile duct dilatation 10/20/2014   Pancreatic cyst 10/20/2014   Kidney stones 10/20/2014   Neuritis or radiculitis due to rupture of lumbar intervertebral disc 06/15/2014   Lumbar canal stenosis 06/15/2014   Degenerative arthritis of lumbar spine 06/15/2014   Lumbar radiculitis 06/15/2014   Osteoarthritis of spine with radiculopathy, lumbar region 06/15/2014    Carrier of infectious disease 07/21/2013   Bloodgood disease 11/05/2012   Arthritis of temporomandibular joint 06/24/2012   Atypical chest pain 01/05/2012   Breath shortness 12/04/2011   Lung mass 11/15/2011   Thyroid nodule 11/15/2011   Age-related macular degeneration, dry 10/03/2011   Disorder of peripheral nervous system 10/03/2011   Chronic rhinitis 03/03/2011   Carotid artery narrowing 03/01/2011   Polypharmacy 12/02/2010    Past Surgical History:  Procedure Laterality Date   ABDOMINAL HYSTERECTOMY     BREAST LUMPECTOMY     CATARACT EXTRACTION     CHOLECYSTECTOMY     JOINT REPLACEMENT Right    Hip   KNEE SURGERY Right     Prior to Admission medications   Medication Sig Start Date End Date Taking? Authorizing Provider  acetaminophen (TYLENOL) 500 MG tablet Take 500 mg by mouth every 4 (four) hours as needed for mild pain or fever.    [provider]  acetaminophen (TYLENOL) 650 MG CR tablet Take 650 mg by mouth 3 (three) times daily.    [provider]  acidophilus (RISAQUAD) CAPS capsule Take by mouth daily.  [provider]  alum & mag hydroxide-simeth (MINTOX) 200-200-20 MG/5ML suspension Take 30 mLs by mouth at bedtime as needed for indigestion or heartburn.    [provider]  carbamide peroxide (GLY-OXIDE) 10 % solution Place 1 application onto teeth 4 (four) times daily -  with meals and at bedtime. (as needed for dry mouth)    [provider]  carvedilol (COREG) 12.5 MG tablet Take 1.5 tablets (18.75 mg total) by mouth 2 (two) times daily. 02/22/18 09/19/27  Theora Gianotti, NP  Cholecalciferol 2000 UNITS TABS Take 2,000 Units by mouth daily.     Roselee Nova, MD  conjugated estrogens (PREMARIN) vaginal cream Apply 0.74m (pea-sized amount)  just inside the vaginal introitus with a finger-tip on  Monday, Wednesday and Friday nights. 04/15/19   MZara CouncilA, PA-C  Cyanocobalamin (VITAMIN  DEFICIENCY SYSTEM-B12) 1000 MCG/ML KIT Inject 1,000 mcg as directed every 30 (thirty) days.     [provider]  diclofenac sodium (VOLTAREN) 1 % GEL Apply 2 g topically 2 (two) times daily as needed (knee pain).    [provider]  docusate sodium (COLACE) 100 MG capsule Take 100 mg by mouth at bedtime.     [provider]  ELIQUIS 5 MG TABS tablet TAKE 1 TABLET(5 MG) BY MOUTH TWICE DAILY 01/26/20   AWellington Hampshire MD  famotidine (PEPCID) 20 MG tablet Take 20 mg by mouth as needed for heartburn or indigestion.    [provider]  fluticasone (FLONASE) 50 MCG/ACT nasal spray Place 1 spray into both nostrils daily. 08/30/17   SWilhelmina Mcardle MD  Fluticasone Furoate (ARNUITY ELLIPTA) 100 MCG/ACT AEPB Inhale 1 puff into the lungs daily. 08/30/17   SWilhelmina Mcardle MD  furosemide (LASIX) 20 MG tablet Take 2 tablets (40MG) by mouth daily in the morning. May take additional 40 mg in the afternoon for weight gain >2 lb in 24 hours or 5 lbs over the course of 5 days. 02/03/20   BTheora Gianotti NP  gabapentin (NEURONTIN) 300 MG capsule daily.    [provider]  guaiFENesin (MUCINEX) 600 MG 12 hr tablet Take 600 mg by mouth 2 (two) times daily as needed for cough or to loosen phlegm.     [provider]  guaiFENesin (ROBITUSSIN) 100 MG/5ML SOLN Take 10 mLs by mouth every 6 (six) hours as needed for cough or to loosen phlegm.    [provider]  lidocaine (LMX) 4 % cream Apply 1 application topically at bedtime.    [provider]  Lidocaine-Glycerin (PREPARATION H EX) Apply topically daily as needed.    [provider]  loperamide (IMODIUM A-D) 2 MG tablet Take 2 mg by mouth as needed for diarrhea or loose stools (max 4 doses daily).     [provider]  loratadine (CLARITIN) 10 MG tablet Take 10 mg by mouth daily.    [provider]  LORazepam (ATIVAN) 0.5 MG tablet Take 1 tablet (0.5 mg total) by  mouth 2 (two) times daily as needed for anxiety. Patient taking differently: Take 0.5 mg by mouth 2 (two) times daily.  05/23/17   SRoselee Nova MD  losartan (COZAAR) 100 MG tablet Take 1 tablet (100 mg total) by mouth daily. 05/23/17   SRoselee Nova MD  lovastatin (MEVACOR) 40 MG tablet Take 1 tablet (40 mg total) by mouth daily. Patient taking differently: Take 40 mg by mouth every Monday, Wednesday, and Friday.  05/23/17   Roselee Nova, MD  Lutein 10 MG TABS Take 10 mg by mouth 2 (two) times daily.    [provider]  magnesium hydroxide (MILK OF MAGNESIA) 400 MG/5ML suspension Take 30 mLs by mouth at bedtime as needed for mild constipation.    [provider]  mirabegron ER (MYRBETRIQ) 25 MG TB24 tablet Take 25 mg by mouth daily.    [provider]  neomycin-bacitracin-polymyxin (NEOSPORIN) [phone] ointment Apply topically 4 (four) times daily as needed (wound care).    [provider]  polyethylene glycol (MIRALAX / GLYCOLAX) 17 g packet Take 17 g by mouth daily.     [provider]  Saccharomyces boulardii (PROBIOTIC) 250 MG CAPS Take 1 capsule by mouth daily.     [provider]  traMADol (ULTRAM) 50 MG tablet Take 50 mg by mouth daily.     [provider]  triamcinolone (KENALOG) 0.025 % ointment Apply 1 application topically as needed.    [provider]  trimethoprim (TRIMPEX) 100 MG tablet Take 1 tablet (100 mg total) by mouth daily. 03/04/18   Bjorn Loser, MD    Allergies 2,4-d dimethylamine (amisol); Celebrex [celecoxib]; Ciprofloxacin; Ibuprofen; Levamisole; Metronidazole; Naproxen; and Other  Family History  Problem Relation Age of Onset   Stroke Mother    Diabetes Father    Heart disease Father    Kidney disease Neg Hx    Bladder Cancer Neg Hx    Kidney cancer Neg Hx     Social History Social History   Tobacco Use   Smoking status: Former Smoker   Smokeless tobacco:  Never Used   Tobacco comment: smoked in high school for a few years only  Media planner   Vaping Use: Never used  Substance Use Topics   Alcohol use: No    Alcohol/week: 0.0 standard drinks   Drug use: No    Review of Systems  Constitutional: No fever/chills Eyes: No visual changes. ENT: No sore throat. Cardiovascular: Denies chest pain. Respiratory: Denies shortness of breath. Gastrointestinal: Positive for abdominal pain.  No nausea, no vomiting.  No diarrhea.  No constipation. Genitourinary: Negative for dysuria.  Positive for urinary frequency. Musculoskeletal: Positive for left hip and back pain. Skin: Negative for rash. Neurological: Negative for headaches, focal weakness or numbness.  ____________________________________________   PHYSICAL EXAM:  VITAL SIGNS: ED Triage Vitals [03/17/20 1236]  Enc Vitals Group     BP (!) 208/170     Pulse Rate (!) 50     Resp 18     Temp 97.9 F (36.6 C)     Temp Source Oral     SpO2 100 %     Weight 146 lb (66.2 kg)     Height _0  (1.676 m)     Head Circumference      Peak Flow      Pain Score 6     Pain Loc      Pain Edu?      Excl. in Power?     Constitutional: Alert and oriented. Eyes: Conjunctivae are normal. Head: Atraumatic. Nose: No congestion/rhinnorhea. Mouth/Throat: Mucous membranes are moist. Neck: Normal ROM Cardiovascular: Normal rate, irregularly irregular rhythm. Grossly normal heart sounds. Respiratory: Normal respiratory effort.  No retractions. Lungs CTAB. Gastrointestinal: Soft and tender to palpation in suprapubic area and left lower quadrant. No distention. Genitourinary: deferred Musculoskeletal: No lower extremity tenderness nor edema.  Range of motion intact to bilateral hips and knees.  No bony  tenderness noted. Neurologic:  Normal speech and language. No gross focal neurologic deficits are appreciated. Skin:  Skin is warm, dry and intact. No rash noted. Psychiatric: Mood and affect are  normal. Speech and behavior are normal.  ____________________________________________   LABS (all labs ordered are listed, but only abnormal results are displayed)  Labs Reviewed  COMPREHENSIVE METABOLIC PANEL - Abnormal; Notable for the following components:      Result Value   Glucose, Bld 103 (*)    Total Bilirubin 1.3 (*)    All other components within normal limits  URINALYSIS, COMPLETE (UACMP) WITH MICROSCOPIC - Abnormal; Notable for the following components:   Color, Urine STRAW (*)    APPearance CLEAR (*)    All other components within normal limits  LIPASE, BLOOD  CBC   ____________________________________________  EKG  ED ECG REPORT I, Blake Divine, the attending physician, personally viewed and interpreted this ECG.   Date: 03/17/2020  EKG Time: 12:34  Rate: 81  Rhythm: Atrial fibrillation, PVCs  Axis: Normal  Intervals:none  ST&T Change: None   PROCEDURES  Procedure(s) performed (including Critical Care):  Procedures   ____________________________________________   INITIAL IMPRESSION / ASSESSMENT AND PLAN / ED COURSE       86 year old female with possible history of hypertension, hyperlipidemia, CHF, and atrial flutter on Eliquis who presents to the ED complaining of left lower abdominal pain, low back pain, and left hip pain for about the past week.  She denies any traumatic injury and range of motion to her left hip is intact, doubt acute bony injury.  She does have some tenderness to her left lower quadrant on exam and we will perform CT scan to assess for diverticulitis.  Labs and UA are unremarkable thus far.  We will treat patient's pain with IV morphine and reassess.  EKG does show atrial fibrillation but with controlled rate and patient does have history of this.  Patient turned over to oncoming provider pending CT results, if negative I suspect her symptoms are musculoskeletal with potential element of sciatica.       ____________________________________________   FINAL CLINICAL IMPRESSION(S) / ED DIAGNOSES  Final diagnoses:  Acute left-sided low back pain with sciatica, sciatica laterality unspecified     ED Discharge Orders    None       Note:  This document was prepared using Dragon voice recognition software and may include unintentional dictation errors.   Blake Divine, MD 03/17/20 [id number]

## 2020-03-25 ENCOUNTER — Encounter: Payer: Self-pay | Admitting: Nurse Practitioner

## 2020-03-25 ENCOUNTER — Ambulatory Visit (INDEPENDENT_AMBULATORY_CARE_PROVIDER_SITE_OTHER): Payer: Medicare Other | Admitting: Nurse Practitioner

## 2020-03-25 ENCOUNTER — Other Ambulatory Visit: Payer: Self-pay

## 2020-03-25 VITALS — BP 140/62 | HR 57 | Ht 66.0 in | Wt 145.0 lb

## 2020-03-25 DIAGNOSIS — E782 Mixed hyperlipidemia: Secondary | ICD-10-CM

## 2020-03-25 DIAGNOSIS — I5032 Chronic diastolic (congestive) heart failure: Secondary | ICD-10-CM

## 2020-03-25 DIAGNOSIS — I10 Essential (primary) hypertension: Secondary | ICD-10-CM | POA: Diagnosis not present

## 2020-03-25 DIAGNOSIS — I4819 Other persistent atrial fibrillation: Secondary | ICD-10-CM | POA: Diagnosis not present

## 2020-03-25 NOTE — Patient Instructions (Signed)
Medication Instructions:  Your physician recommends that you continue on your current medications as directed. Please refer to the Current Medication list given to you today.  *If you need a refill on your cardiac medications before your next appointment, please call your pharmacy*  Follow-Up: At [facility], you and your health needs are our priority.  As part of our continuing mission to provide you with exceptional heart care, we have created designated Provider Care Teams.  These Care Teams include your primary Cardiologist (physician) and Advanced Practice Providers (APPs -  Physician Assistants and Nurse Practitioners) who all work together to provide you with the care you need, when you need it.  We recommend signing up for the patient portal called "MyChart".  Sign up information is provided on this After Visit Summary.  MyChart is used to connect with patients for Virtual Visits (Telemedicine).  Patients are able to view lab/test results, encounter notes, upcoming appointments, etc.  Non-urgent messages can be sent to your provider as well.   To learn more about what you can do with MyChart, go to [email or web address].    Your next appointment:   3 month(s)  The format for your next appointment:   In Person  Provider:   You may see Kathlyn [place], MD or one of the following Advanced Practice Providers on your designated Care Team:    Murray Hodgkins, NP  Christell Faith, PA-C  Marrianne Mood, PA-C  Cadence Aber[place], [place]

## 2020-03-25 NOTE — Progress Notes (Signed)
Office Visit    Patient Name: Jodi Bullock Date of Encounter: 03/25/2020  Primary Care Provider:  [facility] Primary Cardiologist:  Jodi Sacramento, MD  Chief Complaint    86 year old female with a history of nonischemic cardiomyopathy and subsequent improvement in LV function, chronic heart failure with improved EF, hypertension, hyperlipidemia, thymus cancer, right breast cancer, COVID-19 (January 2021), and permanent atrial fibrillation/flutter, presents for follow-up of heart failure.  Past Medical History    Past Medical History:  Diagnosis Date  . Arthritis   . Atrial flutter ([place])    a. Dx 07/2018. CHA2DS2VASc = 5-->Eliquis.  . Bladder incontinence   . Breast cancer ([place]) 1998  . Chronic heart failure with improved ejection fraction (HFimpEF) 01/10/2016   a. 09/2015 Echo: EF 40-45%, possible inf HK, mod PAH (PASP 108mHg); b. 11/2018 Echo: EF 60-65%, RVSP 672mg. Mod dil LA, mildly dil RA. Mod MR.  . Cl[place] of right [place] ([facility]   a. 08/2017->Managed conservatively.  . Marland KitchenOVID-19 virus infection    a. 05/2019 - asymptomatic.  . Marland Kitchenepression   . GERD (gastroesophageal reflux disease)   . History of kidney stones   . HLD (hyperlipidemia)   . Hypertension   . Neuropathy of both feet   . NICM (nonischemic cardiomyopathy) ([facility]   a. 08/2015 Lexiscan MV: EF 39%, no ischemia; b. 09/2015 Echo: EF 40-45%; c. 11/2018 Echo: EF 60-65%.  . Persistent atrial fibrillation ([facility]  . Right Humerus head fracture    a. 07/2016 - managed conservatively.  . 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   [facility]   Past Surgical History:  Procedure Laterality Date  . ABDOMINAL HYSTERECTOMY    . BREAST LUMPECTOMY    . CATARACT EXTRACTION    . CHOLECYSTECTOMY    . JOINT REPLACEMENT Right    Hip  . KNEE SURGERY Right     Allergies  Allergies  Allergen Reactions  . 2,4-D Dimethylamine (Amisol) Other (See Comments)    Other Reaction: OTHER REACTION-IRRITATION TO Esophagous  . Celebrex  [Celecoxib] Other (See Comments)     esophagitis Esophageal spasms    . Ciprofloxacin Other (See Comments)    Esophageal irritation  . Ibuprofen Other (See Comments)    Other reaction(s): Other (See Comments) reflux  . Levamisole Other (See Comments) and Nausea And Vomiting  . Metronidazole Other (See Comments)    neuropathy  . Naproxen Other (See Comments)    GI UPSET  . Other Other (See Comments)    Other Reaction: esophagitis    History of Present Illness    [age]ear old female with the above complex past medical history including hypertension, permanent atrial fibrillation/flutter, hyperlipidemia, GERD, nonischemic cardiomyopathy, heart failure with improved EF, thymus cancer, and remote breast cancer.  Cardiac history dates back to April 2017, when she was evaluated for exertional dyspnea.  Stress testing showed an EF of 39% without ischemia.  Follow-up echo showed an EF of 45-50% with possible inferior hypokinesis.  She has been medically managed.  In March 2020, she was diagnosed with atrial flutter, which has been permanent and asymptomatic.  She has been anticoagulated with Eliquis.  Most recent echocardiogram in July 2020 showed an EF of 60-65% with an RVSP of 61 mmHg.  In January 2021, she was diagnosed with COVID-19.  She was relatively asymptomatic and did not require hospitalization however, her husband was hospitalized for 4 days and then sent to rehab for 2 weeks.  She and her husband [facility].  [name] was last seen in clinic on September 30, at which time she reported weight gain (approximately 4 pounds) and increasing dyspnea on exertion.  She also reported elevated blood pressures at home, and trending in the 140s to 160s.  She was noted to have right greater than left lower extremity edema.  We agreed to continue Lasix at 40 mg daily with an additional 40 mg when weight is greater than 146 pounds.  In the setting bradycardia, carvedilol dosing was  reduced to 12.5 mg twice daily.  Since her last visit, she is continue to diligently watch her blood pressure and weights.  She seems to have found a happy medium with weight and has been trending between 142 and 145.  She has only required Lasix 40 mg once a day and does not think she is required an afternoon dose anytime recently.  Blood pressures have been trending mostly in the 130s or slightly lower.  Lower extremity swelling has been stable.  She continues to have concerns about how operations are carried out at [employer].  She also notes that her vision continues to worsen and she can barely see at this point.  Her husband's health remains poor though they are both stable.  She has not been having any chest pain but has been having left lower left back pain and was seen in the emergency department last week with unremarkable findings.  She has chronic, stable dyspnea exertion.  She denies palpitations, PND, orthopnea, dizziness, syncope, or early satiety.  Home Medications    Prior to Admission medications   Medication Sig Start Date End Date Taking? Authorizing Provider  acetaminophen (TYLENOL) 500 MG tablet Take 500 mg by mouth every 4 (four) hours as needed for mild pain or fever.    [provider]  acetaminophen (TYLENOL) 650 MG CR tablet Take 650 mg by mouth 3 (three) times daily.    [provider]  acidophilus (RISAQUAD) CAPS capsule Take by mouth daily.    [provider]  alum & mag hydroxide-simeth (MINTOX) 200-200-20 MG/5ML suspension Take 30 mLs by mouth at bedtime as needed for indigestion or heartburn.    [provider]  carbamide peroxide (GLY-OXIDE) 10 % solution Place 1 application onto teeth 4 (four) times daily -  with meals and at bedtime. (as needed for dry mouth)    [provider]  carvedilol (COREG) 12.5 MG tablet Take 1.5 tablets (18.75 mg total) by mouth 2 (two) times daily. 02/22/18 09/19/27  Jodi Gianotti, NP   Cholecalciferol 2000 UNITS TABS Take 2,000 Units by mouth daily.     Jodi Nova, MD  conjugated estrogens (PREMARIN) vaginal cream Apply 0.20m (pea-sized amount)  just inside the vaginal introitus with a finger-tip on  Monday, Wednesday and Friday nights. 04/15/19   MZara CouncilA, PA-C  Cyanocobalamin (VITAMIN DEFICIENCY SYSTEM-B12) 1000 MCG/ML KIT Inject 1,000 mcg as directed every 30 (thirty) days.     [provider]  diclofenac sodium (VOLTAREN) 1 % GEL Apply 2 g topically 2 (two) times daily as needed (knee pain).    [provider]  docusate sodium (COLACE) 100 MG capsule Take 100 mg by mouth at bedtime.     [provider]  ELIQUIS 5 MG TABS tablet TAKE 1 TABLET(5 MG) BY MOUTH TWICE DAILY 01/26/20   AWellington Hampshire MD  famotidine (PEPCID) 20 MG tablet Take 20 mg by mouth as needed for heartburn or indigestion.  [provider]  fluticasone (FLONASE) 50 MCG/ACT nasal spray Place 1 spray into both nostrils daily. 08/30/17   Wilhelmina Mcardle, MD  Fluticasone Furoate (ARNUITY ELLIPTA) 100 MCG/ACT AEPB Inhale 1 puff into the lungs daily. 08/30/17   Wilhelmina Mcardle, MD  furosemide (LASIX) 20 MG tablet Take 2 tablets (40MG) by mouth daily in the morning. May take additional 40 mg in the afternoon for weight gain >2 lb in 24 hours or 5 lbs over the course of 5 days. 02/03/20   Jodi Gianotti, NP  gabapentin (NEURONTIN) 300 MG capsule daily.    [provider]  guaiFENesin (MUCINEX) 600 MG 12 hr tablet Take 600 mg by mouth 2 (two) times daily as needed for cough or to loosen phlegm.     [provider]  guaiFENesin (ROBITUSSIN) 100 MG/5ML SOLN Take 10 mLs by mouth every 6 (six) hours as needed for cough or to loosen phlegm.    [provider]  lidocaine (LMX) 4 % cream Apply 1 application topically at bedtime.    [provider]  Lidocaine-Glycerin (PREPARATION H EX) Apply topically daily as needed.     [provider]  loperamide (IMODIUM A-D) 2 MG tablet Take 2 mg by mouth as needed for diarrhea or loose stools (max 4 doses daily).     [provider]  loratadine (CLARITIN) 10 MG tablet Take 10 mg by mouth daily.    [provider]  LORazepam (ATIVAN) 0.5 MG tablet Take 1 tablet (0.5 mg total) by mouth 2 (two) times daily as needed for anxiety. Patient taking differently: Take 0.5 mg by mouth 2 (two) times daily.  05/23/17   Jodi Nova, MD  losartan (COZAAR) 100 MG tablet Take 1 tablet (100 mg total) by mouth daily. 05/23/17   Jodi Nova, MD  lovastatin (MEVACOR) 40 MG tablet Take 1 tablet (40 mg total) by mouth daily. Patient taking differently: Take 40 mg by mouth every Monday, Wednesday, and Friday.  05/23/17   Jodi Nova, MD  Lutein 10 MG TABS Take 10 mg by mouth 2 (two) times daily.    [provider]  magnesium hydroxide (MILK OF MAGNESIA) 400 MG/5ML suspension Take 30 mLs by mouth at bedtime as needed for mild constipation.    [provider]  mirabegron ER (MYRBETRIQ) 25 MG TB24 tablet Take 25 mg by mouth daily.    [provider]  neomycin-bacitracin-polymyxin (NEOSPORIN) [phone] ointment Apply topically 4 (four) times daily as needed (wound care).    [provider]  polyethylene glycol (MIRALAX / GLYCOLAX) 17 g packet Take 17 g by mouth daily.     [provider]  Saccharomyces boulardii (PROBIOTIC) 250 MG CAPS Take 1 capsule by mouth daily.     [provider]  traMADol (ULTRAM) 50 MG tablet Take 50 mg by mouth daily.     [provider]  triamcinolone (KENALOG) 0.025 % ointment Apply 1 application topically as needed.    [provider]  trimethoprim (TRIMPEX) 100 MG tablet Take 1 tablet (100 mg total) by mouth daily. 03/04/18   Bjorn Loser, MD    Review of Systems    Depressed.  Chronic but stable dyspnea exertion.  Lower extremity swelling overall improved.   Weight has been stable.  She has left lower back pain status post recent ER visit.  She denies chest pain, palpitations, PND, orthopnea, dizziness, syncope, or early satiety.  All other systems reviewed  and are otherwise negative except as noted above.  Physical Exam    VS:  BP 140/62 (BP Location: Left Arm, Patient Position: Sitting, Cuff Size: Normal)   Pulse (!) 57   Ht '5\' 6"'  (1.676 m)   Wt 145 lb (65.8 kg)   SpO2 94%   BMI 23.40 kg/m  , BMI Body mass index is 23.4 kg/m. GEN: Somewhat frail, in no acute distress. HEENT: normal. Neck: Supple, no JVD, carotid bruits, or masses. Cardiac: Irregularly irregular, no murmurs, rubs, or gallops. No clubbing, cyanosis, trace right lower extremity edema.  Radials/PT 2+ and equal bilaterally.  Respiratory:  Respirations regular and unlabored, clear to auscultation bilaterally. GI: Soft, nontender, nondistended, BS + x 4. MS: no deformity or atrophy. Skin: warm and dry, no rash. Neuro:  Strength and sensation are intact. Psych: Normal affect.  Accessory Clinical Findings    ECG personally reviewed by me today -atrial fibrillation, 57, delayed R wave progression- no acute changes.  Lab Results  Component Value Date   WBC 9.1 03/17/2020   HGB 14.2 03/17/2020   HCT 43.9 03/17/2020   MCV 97.3 03/17/2020   PLT 152 03/17/2020   Lab Results  Component Value Date   CREATININE 0.71 03/17/2020   BUN 16 03/17/2020   NA 142 03/17/2020   K 4.6 03/17/2020   CL 107 03/17/2020   CO2 27 03/17/2020   Lab Results  Component Value Date   ALT 14 03/17/2020   AST 23 03/17/2020   ALKPHOS 49 03/17/2020   BILITOT 1.3 (H) 03/17/2020   Lab Results  Component Value Date   CHOL 139 05/24/2017   HDL 48 (L) 05/24/2017   LDLCALC 67 05/24/2017   TRIG 163 (H) 05/24/2017   CHOLHDL 2.9 05/24/2017    Lab Results  Component Value Date   HGBA1C 5.4 02/14/2016    Assessment & Plan    1.  Chronic heart failure with improved ejection fraction: EF 60  to 65% by echo in June 2020.  Since her last visit, her weight has been stable in the 142 to 145 pound range.  She identifies her dry weight at 144 pounds.  Lower extremity swelling has been stable and she has been taking Lasix 40 mg once a day without requiring a second dose in the afternoon.  Heart rate and blood pressure stable today on the lower dose of carvedilol (reduced at last visit to 12.5 mg twice daily in the setting bradycardia).  Continue beta-blocker and ARB.  2.  Permanent atrial fibrillation: I reduce her carvedilol from 18.75 mg twice daily to 12.5 mg twice daily at her last visit in the setting of slow ventricular response with a rate of 49.  Rate is 57 today.  She has not had any presyncope or syncope.  Labs from November 3 stable.  She remains on Eliquis therapy.  3.  Essential hypertension: Pressure 140/62 today and has been trending in the 120s to 140 more recently at home.  No changes to medications today.  4.  Hyperlipidemia: Continue statin therapy.  Lipids previously followed by primary care.  5.  Disposition: Follow-up in 3 months or sooner if necessary.  She is interested in receiving the COVID-19 vaccine booster and I did write on her paperwork for [place] that she received this soon as possible.   Murray Hodgkins, NP 03/25/2020, 11:01 AM

## 2020-05-11 ENCOUNTER — Ambulatory Visit: Payer: Self-pay | Admitting: Podiatry

## 2020-05-17 DIAGNOSIS — M79609 Pain in unspecified limb: Secondary | ICD-10-CM | POA: Insufficient documentation

## 2020-05-17 DIAGNOSIS — R350 Frequency of micturition: Secondary | ICD-10-CM | POA: Insufficient documentation

## 2020-05-17 DIAGNOSIS — G894 Chronic pain syndrome: Secondary | ICD-10-CM | POA: Insufficient documentation

## 2020-05-17 DIAGNOSIS — R634 Abnormal weight loss: Secondary | ICD-10-CM | POA: Insufficient documentation

## 2020-05-17 DIAGNOSIS — S0093XA Contusion of unspecified part of head, initial encounter: Secondary | ICD-10-CM | POA: Insufficient documentation

## 2020-05-17 DIAGNOSIS — J418 Mixed simple and mucopurulent chronic bronchitis: Secondary | ICD-10-CM | POA: Insufficient documentation

## 2020-05-17 DIAGNOSIS — B373 Candidiasis of vulva and vagina: Secondary | ICD-10-CM | POA: Insufficient documentation

## 2020-05-17 DIAGNOSIS — B351 Tinea unguium: Secondary | ICD-10-CM | POA: Insufficient documentation

## 2020-05-17 DIAGNOSIS — R600 Localized edema: Secondary | ICD-10-CM | POA: Insufficient documentation

## 2020-05-17 DIAGNOSIS — S32509A Unspecified fracture of unspecified pubis, initial encounter for closed fracture: Secondary | ICD-10-CM | POA: Insufficient documentation

## 2020-05-17 DIAGNOSIS — D519 Vitamin B12 deficiency anemia, unspecified: Secondary | ICD-10-CM | POA: Insufficient documentation

## 2020-05-17 DIAGNOSIS — U071 COVID-19: Secondary | ICD-10-CM | POA: Insufficient documentation

## 2020-05-17 DIAGNOSIS — K5901 Slow transit constipation: Secondary | ICD-10-CM | POA: Insufficient documentation

## 2020-05-17 DIAGNOSIS — N952 Postmenopausal atrophic vaginitis: Secondary | ICD-10-CM | POA: Insufficient documentation

## 2020-05-17 DIAGNOSIS — S51012A Laceration without foreign body of left elbow, initial encounter: Secondary | ICD-10-CM | POA: Insufficient documentation

## 2020-05-17 DIAGNOSIS — N3281 Overactive bladder: Secondary | ICD-10-CM | POA: Insufficient documentation

## 2020-05-17 DIAGNOSIS — S3210XA Unspecified fracture of sacrum, initial encounter for closed fracture: Secondary | ICD-10-CM | POA: Insufficient documentation

## 2020-05-17 DIAGNOSIS — D7589 Other specified diseases of blood and blood-forming organs: Secondary | ICD-10-CM | POA: Insufficient documentation

## 2020-05-17 DIAGNOSIS — B3731 Acute candidiasis of vulva and vagina: Secondary | ICD-10-CM | POA: Insufficient documentation

## 2020-05-17 DIAGNOSIS — I2581 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: Secondary | ICD-10-CM | POA: Insufficient documentation

## 2020-05-17 DIAGNOSIS — D510 Vitamin B12 deficiency anemia due to intrinsic factor deficiency: Secondary | ICD-10-CM | POA: Insufficient documentation

## 2020-05-17 DIAGNOSIS — R300 Dysuria: Secondary | ICD-10-CM | POA: Insufficient documentation

## 2020-05-17 DIAGNOSIS — J069 Acute upper respiratory infection, unspecified: Secondary | ICD-10-CM | POA: Insufficient documentation

## 2020-05-17 DIAGNOSIS — I4892 Unspecified atrial flutter: Secondary | ICD-10-CM | POA: Insufficient documentation

## 2020-05-17 DIAGNOSIS — G2581 Restless legs syndrome: Secondary | ICD-10-CM | POA: Insufficient documentation

## 2020-05-17 DIAGNOSIS — L97519 Non-pressure chronic ulcer of other part of right foot with unspecified severity: Secondary | ICD-10-CM | POA: Insufficient documentation

## 2020-05-17 DIAGNOSIS — F329 Major depressive disorder, single episode, unspecified: Secondary | ICD-10-CM | POA: Insufficient documentation

## 2020-05-17 DIAGNOSIS — R609 Edema, unspecified: Secondary | ICD-10-CM | POA: Insufficient documentation

## 2020-05-17 DIAGNOSIS — M5430 Sciatica, unspecified side: Secondary | ICD-10-CM | POA: Insufficient documentation

## 2020-05-18 ENCOUNTER — Encounter: Payer: Self-pay | Admitting: Podiatry

## 2020-05-18 ENCOUNTER — Ambulatory Visit (INDEPENDENT_AMBULATORY_CARE_PROVIDER_SITE_OTHER): Payer: Medicare Other | Admitting: Podiatry

## 2020-05-18 ENCOUNTER — Other Ambulatory Visit: Payer: Self-pay

## 2020-05-18 DIAGNOSIS — L97512 Non-pressure chronic ulcer of other part of right foot with fat layer exposed: Secondary | ICD-10-CM | POA: Diagnosis not present

## 2020-05-18 MED ORDER — DOXYCYCLINE HYCLATE 100 MG PO TABS: 100.0000 mg | ORAL_TABLET | Freq: Two times a day (BID) | ORAL | 0 refills | Status: DC

## 2020-05-18 NOTE — Progress Notes (Signed)
Subjective:  Patient ID: Jodi Bullock, female    DOB: Apr 12, 1925,  MRN: [id number]  Chief Complaint  Patient presents with  . Nail Problem  . Callouses    Nail, callous trim, RFC    She c/o painful callouses bilat feet, right worse than left and right foot pain midfoot and toes, some swelling    87 y.o. female presents for wound care.  Patient presents with complaint of right first metatarsophalangeal joint ulceration with fat layer exposed.  Patient states is painful to walk on.  She is at a nursing facility that has not been taking care of her.  She says there is some redness associated with it.  She is not a diabetic.  She denies any other acute complaints she has not seen anyone else prior to seeing me.   Review of Systems: Negative except as noted in the HPI. Denies N/V/F/Ch.  Past Medical History:  Diagnosis Date  . Arthritis   . Atrial flutter ([place])    a. Dx 07/2018. CHA2DS2VASc = 5-->Eliquis.  . Bladder incontinence   . Breast cancer ([place]) 1998  . Chronic heart failure with improved ejection fraction (HFimpEF) 01/10/2016   a. 09/2015 Echo: EF 40-45%, possible inf HK, mod PAH (PASP 240mHg); b. 11/2018 Echo: EF 60-65%, RVSP 664mg. Mod dil LA, mildly dil RA. Mod MR.  . Closed fracture of right femur ([facility]   a. 08/2017->Managed conservatively.  . Marland KitchenOVID-19 virus infection    a. 05/2019 - asymptomatic.  . Marland Kitchenepression   . GERD (gastroesophageal reflux disease)   . History of kidney stones   . HLD (hyperlipidemia)   . Hypertension   . Neuropathy of both feet   . [place] ([place]mic cardiomyopathy) ([facility]   a. 08/2015 Lexiscan MV: EF 39%, no ischemia; b. 09/2015 Echo: EF 40-45%; c. 11/2018 Echo: EF 60-65%.  . Persistent atrial fibrillation ([facility]  . Right Humerus head fracture    a. 07/2016 - managed conservatively.  . 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[facility]   cancer    Current Outpatient Medications:  .  doxycycline (VIBRA-TABS) 100 MG tablet, Take 1 tablet (100 mg total) by mouth 2 (two) times  daily., Disp: 28 tablet, Rfl: 0 .  acetaminophen (TYLENOL) 500 MG tablet, Take 500 mg by mouth every 4 (four) hours as needed for mild pain or fever., Disp: , Rfl:  .  acetaminophen (TYLENOL) 650 MG CR tablet, Take 650 mg by mouth 3 (three) times daily., Disp: , Rfl:  .  acidophilus (RISAQUAD) CAPS capsule, Take by mouth daily., Disp: , Rfl:  .  alum & mag hydroxide-simeth (MINTOX) 20825-003-70G/5ML suspension, Take 30 mLs by mouth at bedtime as needed for indigestion or heartburn., Disp: , Rfl:  .  azithromycin (ZITHROMAX) 250 MG tablet, Take 250 mg by mouth as directed., Disp: , Rfl:  .  carbamide peroxide (GLY-OXIDE) 10 % solution, Place 1 application onto teeth 4 (four) times daily -  with meals and at bedtime. (as needed for dry mouth), Disp: , Rfl:  .  carvedilol (COREG) 12.5 MG tablet, Take 12.5 mg by mouth 2 (two) times daily with a meal., Disp: , Rfl:  .  cephALEXin (KEFLEX) 500 MG capsule, Take 500 mg by mouth 3 (three) times daily., Disp: , Rfl:  .  Cholecalciferol 2000 UNITS TABS, Take 2,000 Units by mouth daily. , Disp: , Rfl:  .  conjugated estrogens (PREMARIN) vaginal cream, Apply 0.40m4mpea-sized amount)  just inside the vaginal introitus with a finger-tip on  Monday, Wednesday and Friday nights., Disp: 30 g, Rfl: 12 .  Cyanocobalamin (VITAMIN DEFICIENCY SYSTEM-B12) 1000 MCG/ML KIT, Inject 1,000 mcg as directed every 30 (thirty) days. , Disp: , Rfl:  .  diclofenac sodium (VOLTAREN) 1 % GEL, Apply 2 g topically 2 (two) times daily as needed (knee pain)., Disp: , Rfl:  .  docusate sodium (COLACE) 100 MG capsule, Take 100 mg by mouth at bedtime. , Disp: , Rfl:  .  ELIQUIS 5 MG TABS tablet, TAKE 1 TABLET(5 MG) BY MOUTH TWICE DAILY, Disp: 180 tablet, Rfl: 1 .  estrogens, conjugated, (PREMARIN) 0.625 MG tablet, Take by mouth., Disp: , Rfl:  .  famotidine (PEPCID) 20 MG tablet, Take 20 mg by mouth as needed for heartburn or indigestion., Disp: , Rfl:  .  fluticasone (FLONASE) 50 MCG/ACT  nasal spray, Place 1 spray into both nostrils daily., Disp: 16 g, Rfl: 2 .  Fluticasone Furoate (ARNUITY ELLIPTA) 100 MCG/ACT AEPB, Inhale 1 puff into the lungs daily., Disp: 30 each, Rfl: 10 .  furosemide (LASIX) 20 MG tablet, Take 2 tablets (40MG) by mouth daily in the morning. May take additional 40 mg in the afternoon for weight gain >2 lb in 24 hours or 5 lbs over the course of 5 days., Disp: 180 tablet, Rfl: 3 .  gabapentin (NEURONTIN) 100 MG capsule, Take by mouth., Disp: , Rfl:  .  gabapentin (NEURONTIN) 300 MG capsule, daily., Disp: , Rfl:  .  guaiFENesin (MUCINEX) 600 MG 12 hr tablet, Take 600 mg by mouth 2 (two) times daily as needed for cough or to loosen phlegm. , Disp: , Rfl:  .  guaiFENesin (ROBITUSSIN) 100 MG/5ML SOLN, Take 10 mLs by mouth every 6 (six) hours as needed for cough or to loosen phlegm., Disp: , Rfl:  .  lidocaine (LMX) 4 % cream, Apply 1 application topically at bedtime., Disp: , Rfl:  .  Lidocaine-Glycerin (PREPARATION H EX), Apply topically daily as needed., Disp: , Rfl:  .  loperamide (IMODIUM A-D) 2 MG tablet, Take 2 mg by mouth as needed for diarrhea or loose stools (max 4 doses daily). , Disp: , Rfl:  .  loratadine (CLARITIN) 10 MG tablet, Take 10 mg by mouth daily., Disp: , Rfl:  .  LORazepam (ATIVAN) 0.5 MG tablet, Take 1 tablet (0.5 mg total) by mouth 2 (two) times daily as needed for anxiety. (Patient taking differently: Take 0.5 mg by mouth 2 (two) times daily. ), Disp: 60 tablet, Rfl: 2 .  losartan (COZAAR) 100 MG tablet, Take 1 tablet (100 mg total) by mouth daily., Disp: 30 tablet, Rfl: 2 .  lovastatin (MEVACOR) 40 MG tablet, Take 1 tablet (40 mg total) by mouth daily. (Patient taking differently: Take 40 mg by mouth every Monday, Wednesday, and Friday. ), Disp: 90 tablet, Rfl: 0 .  Lutein 10 MG TABS, Take 10 mg by mouth 2 (two) times daily., Disp: , Rfl:  .  magnesium hydroxide (MILK OF MAGNESIA) 400 MG/5ML suspension, Take 30 mLs by mouth at bedtime as  needed for mild constipation., Disp: , Rfl:  .  mirabegron ER (MYRBETRIQ) 25 MG TB24 tablet, Take 25 mg by mouth daily., Disp: , Rfl:  .  neomycin-bacitracin-polymyxin (NEOSPORIN) [phone] ointment, Apply topically 4 (four) times daily as needed (wound care)., Disp: , Rfl:  .  polyethylene glycol (MIRALAX / GLYCOLAX) 17 g packet, Take 17 g by mouth daily. , Disp: , Rfl:  .  Saccharomyces boulardii (PROBIOTIC) 250 MG CAPS, Take 1 capsule  by mouth daily. , Disp: , Rfl:  .  Sodium Phosphates (ENEMA) 7-19 GM/118ML ENEM, Place rectally., Disp: , Rfl:  .  traMADol (ULTRAM) 50 MG tablet, Take 50 mg by mouth daily. , Disp: , Rfl:  .  triamcinolone (KENALOG) 0.025 % ointment, Apply 1 application topically as needed., Disp: , Rfl:  .  triamcinolone lotion (KENALOG) 0.1 %, Apply topically., Disp: , Rfl:  .  trimethoprim (TRIMPEX) 100 MG tablet, Take 1 tablet (100 mg total) by mouth daily., Disp: 30 tablet, Rfl: 11  Social History   Tobacco Use  Smoking Status Former Smoker  Smokeless Tobacco Never Used  Tobacco Comment   smoked in high school for a few years only    Allergies  Allergen Reactions  . 2,4-D Dimethylamine (Amisol) Other (See Comments)    Other Reaction: OTHER REACTION-IRRITATION TO Esophagous  . Celebrex [Celecoxib] Other (See Comments)     esophagitis Esophageal spasms    . Ciprofloxacin Other (See Comments)    Esophageal irritation  . Ibuprofen Other (See Comments)    Other reaction(s): Other (See Comments) reflux  . Levamisole Other (See Comments) and Nausea And Vomiting  . Metronidazole Other (See Comments)    neuropathy  . Naproxen Other (See Comments)    GI UPSET  . Other Other (See Comments)    Other Reaction: esophagitis   Objective:  There were no vitals filed for this visit. There is no height or weight on file to calculate BMI. Constitutional Well developed. Well nourished.  Vascular Dorsalis pedis pulses palpable bilaterally. Posterior tibial pulses  palpable bilaterally. Capillary refill normal to all digits.  No cyanosis or clubbing noted. Pedal hair growth normal.  Neurologic Normal speech. Oriented to person, place, and time. Protective sensation absent  Dermatologic Wound Location: Right dorsal first metatarsophalangeal joint with fat layer exposedDoes not probe down to bone. Mild redness noticed around the wound site Wound Base: Mixed Granular/Fibrotic Peri-wound: Reddened Exudate: Scant/small amount Serosanguinous exudate Wound Measurements: -See below  Orthopedic: No pain to palpation either foot.   Radiographs: None Assessment:   1. Ulcer of right foot with fat layer exposed ([place])    Plan:  Patient was evaluated and treated and all questions answered.  Ulcer right first MPJ plantar ulceration with fat layer exposed -Debridement as below. -Dressed with Betadine wet-to-dry, DSD. -Continue off-loading with surgical shoe.  Procedure: Excisional Debridement of Wound Tool: Sharp chisel blade/tissue nipper Rationale: Removal of non-viable soft tissue from the wound to promote healing.  Anesthesia: none Pre-Debridement Wound Measurements: 1.1 cm x 1.1 cm x 0.3 cm  Post-Debridement Wound Measurements: 1.3 cm x 1.2 cm x 0.3 cm  Type of Debridement: Sharp Excisional Tissue Removed: Non-viable soft tissue Blood loss: Minimal (<50cc) Depth of Debridement: subcutaneous tissue. Technique: Sharp excisional debridement to bleeding, viable wound base.  Wound Progress: This is my initial evaluation I will continue monitor the progression of the wound. Site healing conversation 7 Dressing: Dry, sterile, compression dressing. Disposition: Patient tolerated procedure well. Patient to return in 1 week for follow-up.  No follow-ups on file.

## 2020-05-19 ENCOUNTER — Ambulatory Visit: Payer: Medicare Other | Admitting: Nurse Practitioner

## 2020-06-01 ENCOUNTER — Other Ambulatory Visit: Payer: Self-pay

## 2020-06-01 ENCOUNTER — Ambulatory Visit (INDEPENDENT_AMBULATORY_CARE_PROVIDER_SITE_OTHER): Payer: Medicare Other | Admitting: Podiatry

## 2020-06-01 ENCOUNTER — Encounter: Payer: Self-pay | Admitting: Podiatry

## 2020-06-01 DIAGNOSIS — L97512 Non-pressure chronic ulcer of other part of right foot with fat layer exposed: Secondary | ICD-10-CM

## 2020-06-01 NOTE — Progress Notes (Signed)
Subjective:  Patient ID: Jodi Bullock, female    DOB: [birth year],  MRN: [id number]  Chief Complaint  Patient presents with  . Foot Ulcer    About the same as before.  "it hurts when I walk"    87 y.o. female presents for wound care. Patient presents with a follow-up of her right first metatarsophalangeal joint ulceration. Patient states is about the same. The nursing has not been doing any dressing changes. She states she does not like the nursing facility.  Review of Systems: Negative except as noted in the HPI. Denies N/V/F/Ch.  Past Medical History:  Diagnosis Date  . Arthritis   . Atrial flutter ([place])    a. Dx 07/2018. CHA2DS2VASc = 5-->Eliquis.  . Bladder incontinence   . Breast cancer ([place]) 1998  . Chronic heart failure with improved ejection fraction (HFimpEF) 01/10/2016   a. 09/2015 Echo: EF 40-45%, possible inf HK, mod PAH (PASP 74mmHg); b. 11/2018 Echo: EF 60-65%, RVSP 66mmHg. Mod dil LA, mildly dil RA. Mod MR.  . Closed fracture of right femur ([place])    a. 08/2017->Managed conservatively.  Marland Kitchen COVID-19 virus infection    a. 05/2019 - asymptomatic.  Marland Kitchen Depression   . GERD (gastroesophageal reflux disease)   . History of kidney stones   . HLD (hyperlipidemia)   . Hypertension   . Neuropathy of both feet   . NICM (nonischemic cardiomyopathy) ([place])    a. 08/2015 Lexiscan MV: EF 39%, no ischemia; b. 09/2015 Echo: EF 40-45%; c. 11/2018 Echo: EF 60-65%.  . Persistent atrial fibrillation ([place])   . Right Humerus head fracture    a. 07/2016 - managed conservatively.  . 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  acetaminophen (TYLENOL) 500 MG tablet, Take 500 mg by mouth every 4 (four) hours as needed for mild pain or fever., Disp: , Rfl:  .  acetaminophen (TYLENOL) 650 MG CR tablet, Take 650 mg by mouth 3 (three) times daily., Disp: , Rfl:  .  acidophilus (RISAQUAD) CAPS capsule, Take by mouth daily., Disp: , Rfl:  .  alum & mag hydroxide-simeth (MINTOX)  128-786-76 MG/5ML suspension, Take 30 mLs by mouth at bedtime as needed for indigestion or heartburn., Disp: , Rfl:  .  azithromycin (ZITHROMAX) 250 MG tablet, Take 250 mg by mouth as directed., Disp: , Rfl:  .  carbamide peroxide (GLY-OXIDE) 10 % solution, Place 1 application onto teeth 4 (four) times daily -  with meals and at bedtime. (as needed for dry mouth), Disp: , Rfl:  .  carvedilol (COREG) 12.5 MG tablet, Take 12.5 mg by mouth 2 (two) times daily with a meal., Disp: , Rfl:  .  cephALEXin (KEFLEX) 500 MG capsule, Take 500 mg by mouth 3 (three) times daily., Disp: , Rfl:  .  Cholecalciferol 2000 UNITS TABS, Take 2,000 Units by mouth daily. , Disp: , Rfl:  .  conjugated estrogens (PREMARIN) vaginal cream, Apply 0.$RemoveBefore'5mg'[id number]$  (pea-sized amount)  just inside the vaginal introitus with a finger-tip on  Monday, Wednesday and Friday nights., Disp: 30 g, Rfl: 12 .  Cyanocobalamin (VITAMIN DEFICIENCY SYSTEM-B12) 1000 MCG/ML KIT, Inject 1,000 mcg as directed every 30 (thirty) days. , Disp: , Rfl:  .  diclofenac sodium (VOLTAREN) 1 % GEL, Apply 2 g topically 2 (two) times daily as needed (knee pain)., Disp: , Rfl:  .  docusate sodium (COLACE) 100 MG capsule, Take 100 mg by mouth at bedtime. , Disp: , Rfl:  .  doxycycline (VIBRA-TABS) 100 MG tablet, Take 1 tablet (100 mg total) by mouth 2 (two) times daily., Disp: 28 tablet, Rfl: 0 .  ELIQUIS 5 MG TABS tablet, TAKE 1 TABLET(5 MG) BY MOUTH TWICE DAILY, Disp: 180 tablet, Rfl: 1 .  estrogens, conjugated, (PREMARIN) 0.625 MG tablet, Take by mouth., Disp: , Rfl:  .  famotidine (PEPCID) 20 MG tablet, Take 20 mg by mouth as needed for heartburn or indigestion., Disp: , Rfl:  .  fluticasone (FLONASE) 50 MCG/ACT nasal spray, Place 1 spray into both nostrils daily., Disp: 16 g, Rfl: 2 .  Fluticasone Furoate (ARNUITY ELLIPTA) 100 MCG/ACT AEPB, Inhale 1 puff into the lungs daily., Disp: 30 each, Rfl: 10 .  furosemide (LASIX) 20 MG tablet, Take 2 tablets (40MG) by mouth  daily in the morning. May take additional 40 mg in the afternoon for weight gain >2 lb in 24 hours or 5 lbs over the course of 5 days., Disp: 180 tablet, Rfl: 3 .  gabapentin (NEURONTIN) 100 MG capsule, Take by mouth., Disp: , Rfl:  .  gabapentin (NEURONTIN) 300 MG capsule, daily., Disp: , Rfl:  .  guaiFENesin (MUCINEX) 600 MG 12 hr tablet, Take 600 mg by mouth 2 (two) times daily as needed for cough or to loosen phlegm. , Disp: , Rfl:  .  guaiFENesin (ROBITUSSIN) 100 MG/5ML SOLN, Take 10 mLs by mouth every 6 (six) hours as needed for cough or to loosen phlegm., Disp: , Rfl:  .  lidocaine (LMX) 4 % cream, Apply 1 application topically at bedtime., Disp: , Rfl:  .  Lidocaine-Glycerin (PREPARATION H EX), Apply topically daily as needed., Disp: , Rfl:  .  loperamide (IMODIUM A-D) 2 MG tablet, Take 2 mg by mouth as needed for diarrhea or loose stools (max 4 doses daily). , Disp: , Rfl:  .  loratadine (CLARITIN) 10 MG tablet, Take 10 mg by mouth daily., Disp: , Rfl:  .  LORazepam (ATIVAN) 0.5 MG tablet, Take 1 tablet (0.5 mg total) by mouth 2 (two) times daily as needed for anxiety. (Patient taking differently: Take 0.5 mg by mouth 2 (two) times daily. ), Disp: 60 tablet, Rfl: 2 .  losartan (COZAAR) 100 MG tablet, Take 1 tablet (100 mg total) by mouth daily., Disp: 30 tablet, Rfl: 2 .  lovastatin (MEVACOR) 40 MG tablet, Take 1 tablet (40 mg total) by mouth daily. (Patient taking differently: Take 40 mg by mouth every Monday, Wednesday, and Friday. ), Disp: 90 tablet, Rfl: 0 .  Lutein 10 MG TABS, Take 10 mg by mouth 2 (two) times daily., Disp: , Rfl:  .  magnesium hydroxide (MILK OF MAGNESIA) 400 MG/5ML suspension, Take 30 mLs by mouth at bedtime as needed for mild constipation., Disp: , Rfl:  .  mirabegron ER (MYRBETRIQ) 25 MG TB24 tablet, Take 25 mg by mouth daily., Disp: , Rfl:  .  neomycin-bacitracin-polymyxin (NEOSPORIN) [phone] ointment, Apply topically 4 (four) times daily as needed (wound care).,  Disp: , Rfl:  .  polyethylene glycol (MIRALAX / GLYCOLAX) 17 g packet, Take 17 g by mouth daily. , Disp: , Rfl:  .  Saccharomyces boulardii (PROBIOTIC) 250 MG CAPS, Take 1 capsule by mouth daily. , Disp: , Rfl:  .  Sodium Phosphates (ENEMA) 7-19 GM/118ML ENEM, Place rectally., Disp: , Rfl:  .  traMADol (ULTRAM) 50 MG tablet, Take 50 mg by mouth daily. , Disp: , Rfl:  .  triamcinolone (KENALOG) 0.025 % ointment, Apply 1 application topically as needed., Disp: ,  Rfl:  .  triamcinolone lotion (KENALOG) 0.1 %, Apply topically., Disp: , Rfl:  .  trimethoprim (TRIMPEX) 100 MG tablet, Take 1 tablet (100 mg total) by mouth daily., Disp: 30 tablet, Rfl: 11  Social History   Tobacco Use  Smoking Status Former Smoker  Smokeless Tobacco Never Used  Tobacco Comment   smoked in high school for a few years only    Allergies  Allergen Reactions  . 2,4-D Dimethylamine (Amisol) Other (See Comments)    Other Reaction: OTHER REACTION-IRRITATION TO Esophagous  . Celebrex [Celecoxib] Other (See Comments)     esophagitis Esophageal spasms    . Ciprofloxacin Other (See Comments)    Esophageal irritation  . Ibuprofen Other (See Comments)    Other reaction(s): Other (See Comments) reflux  . Levamisole Other (See Comments) and Nausea And Vomiting  . Metronidazole Other (See Comments)    neuropathy  . Naproxen Other (See Comments)    GI UPSET  . Other Other (See Comments)    Other Reaction: esophagitis   Objective:  There were no vitals filed for this visit. There is no height or weight on file to calculate BMI. Constitutional Well developed. Well nourished.  Vascular Dorsalis pedis pulses palpable bilaterally. Posterior tibial pulses palpable bilaterally. Capillary refill normal to all digits.  No cyanosis or clubbing noted. Pedal hair growth normal.  Neurologic Normal speech. Oriented to person, place, and time. Protective sensation absent  Dermatologic Wound Location: Right dorsal first  metatarsophalangeal joint with fat layer exposedDoes not probe down to bone. Mild redness noticed around the wound site Wound Base: Mixed Granular/Fibrotic Peri-wound: Reddened Exudate: Scant/small amount Serosanguinous exudate Wound Measurements: -See below  Orthopedic: No pain to palpation either foot.   Radiographs: None Assessment:   No diagnosis found. Plan:  Patient was evaluated and treated and all questions answered.  Ulcer right first MPJ plantar ulceration with fat layer exposed -Debridement as below. -Dressed with Betadine wet-to-dry, DSD. -Continue off-loading with surgical shoe. -Given that this wound is now resolving I believe patient will benefit from times medical graft application as she has been dealing with this even prior to see me for greater than 4 weeks. -I will work on authorization for the times medical graft.  Procedure: Excisional Debridement of Wound~stagnant Tool: Sharp chisel blade/tissue nipper Rationale: Removal of non-viable soft tissue from the wound to promote healing.  Anesthesia: none Pre-Debridement Wound Measurements: 1.1 cm x 1.1 cm x 0.3 cm  Post-Debridement Wound Measurements: 1.3 cm x 1.2 cm x 0.3 cm  Type of Debridement: Sharp Excisional Tissue Removed: Non-viable soft tissue Blood loss: Minimal (<50cc) Depth of Debridement: subcutaneous tissue. Technique: Sharp excisional debridement to bleeding, viable wound base.  Wound Progress: This is my initial evaluation I will continue monitor the progression of the wound. Site healing conversation 7 Dressing: Dry, sterile, compression dressing. Disposition: Patient tolerated procedure well. Patient to return in 1 week for follow-up.  No follow-ups on file.

## 2020-06-10 ENCOUNTER — Telehealth: Payer: Self-pay | Admitting: *Deleted

## 2020-06-10 NOTE — Telephone Encounter (Signed)
Called and spoke with Pam ([facility] ), stated that she has received the verbal.

## 2020-06-10 NOTE — Telephone Encounter (Signed)
Pam / Reimb[facility] for wounds calling for status on intake form submitted not signed, please call with verbal or refax form with physician's signature and date

## 2020-06-10 NOTE — Telephone Encounter (Signed)
Yeah that is fine you can give my verbal auth

## 2020-06-15 ENCOUNTER — Ambulatory Visit (INDEPENDENT_AMBULATORY_CARE_PROVIDER_SITE_OTHER): Payer: Medicare Other | Admitting: Podiatry

## 2020-06-15 ENCOUNTER — Encounter: Payer: Self-pay | Admitting: Podiatry

## 2020-06-15 ENCOUNTER — Other Ambulatory Visit: Payer: Self-pay

## 2020-06-15 DIAGNOSIS — L97512 Non-pressure chronic ulcer of other part of right foot with fat layer exposed: Secondary | ICD-10-CM | POA: Diagnosis not present

## 2020-06-16 ENCOUNTER — Encounter: Payer: Self-pay | Admitting: Podiatry

## 2020-06-16 NOTE — Progress Notes (Signed)
Subjective:  Patient ID: Jodi Bullock, female    DOB: [birth year],  MRN: [id number]  Chief Complaint  Patient presents with  . Foot Ulcer    "its doing ok.  I still have pain at times"    87 y.o. female presents for wound care. Patient presents with a follow-up of her right first metatarsophalangeal joint ulceration. Patient states is about the same. The nursing has not been doing any dressing changes. She states she does not like the nursing facility.  Review of Systems: Negative except as noted in the HPI. Denies N/V/F/Ch.  Past Medical History:  Diagnosis Date  . Arthritis   . Atrial flutter ([place])    a. Dx 07/2018. CHA2DS2VASc = 5-->Eliquis.  . Bladder incontinence   . Breast cancer ([place]) 1998  . Chronic heart failure with improved ejection fraction (HFimpEF) 01/10/2016   a. 09/2015 Echo: EF 40-45%, possible inf HK, mod PAH (PASP 34mHg); b. 11/2018 Echo: EF 60-65%, RVSP 621mg. Mod dil LA, mildly dil RA. Mod MR.  . [place] right [place]   a. 08/2017->Managed conservatively.  . Marland KitchenOVID-19 virus infection    a. 05/2019 - asymptomatic.  . Marland Kitchenepression   . GERD (gastroesophageal reflux disease)   . History of kidney stones   . HLD (hyperlipidemia)   . Hypertension   . Neuropathy of both feet   . NIC[place] ([place]mic cardiomyopathy) ([place]   a. 08/2015 Lexiscan MV: EF 39%, no ischemia; b. 09/2015 Echo: EF 40-45%; c. 11/2018 Echo: EF 60-65%.  . Persistent atrial fibrillation ([facility]  . Right Humerus head fracture    a. 07/2016 - managed conservatively.  . 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 [place]   [place]    Current Outpatient Medications:  .  acetaminophen (TYLENOL) 500 MG tablet, Take 500 mg by mouth every 4 (four) hours as needed for mild pain or fever., Disp: , Rfl:  .  acetaminophen (TYLENOL) 650 MG CR tablet, Take 650 mg by mouth 3 (three) times daily., Disp: , Rfl:  .  acidophilus (RISAQUAD) CAPS capsule, Take by mouth daily., Disp: , Rfl:  .  alum & mag hydroxide-simeth (MINTOX)  20179-150-56G/5ML suspension, Take 30 mLs by mouth at bedtime as needed for indigestion or heartburn., Disp: , Rfl:  .  azithromycin (ZITHROMAX) 250 MG tablet, Take 250 mg by mouth as directed., Disp: , Rfl:  .  carbamide peroxide (GLY-OXIDE) 10 % solution, Place 1 application onto teeth 4 (four) times daily -  with meals and at bedtime. (as needed for dry mouth), Disp: , Rfl:  .  carvedilol (COREG) 12.5 MG tablet, Take 12.5 mg by mouth 2 (two) times daily with a meal., Disp: , Rfl:  .  cephALEXin (KEFLEX) 500 MG capsule, Take 500 mg by mouth 3 (three) times daily., Disp: , Rfl:  .  Cholecalciferol 2000 UNITS TABS, Take 2,000 Units by mouth daily. , Disp: , Rfl:  .  conjugated estrogens (PREMARIN) vaginal cream, Apply 0.22m21mpea-sized amount)  just inside the vaginal introitus with a finger-tip on  Monday, Wednesday and Friday nights., Disp: 30 g, Rfl: 12 .  Cyanocobalamin (VITAMIN DEFICIENCY SYSTEM-B12) 1000 MCG/ML KIT, Inject 1,000 mcg as directed every 30 (thirty) days. , Disp: , Rfl:  .  diclofenac sodium (VOLTAREN) 1 % GEL, Apply 2 g topically 2 (two) times daily as needed (knee pain)., Disp: , Rfl:  .  docusate sodium (COLACE) 100 MG capsule, Take 100 mg by mouth at bedtime. , Disp: , Rfl:  .  doxycycline (VIBRA-TABS) 100 MG tablet, Take 1 tablet (100 mg total) by mouth 2 (two) times daily., Disp: 28 tablet, Rfl: 0 .  ELIQUIS 5 MG TABS tablet, TAKE 1 TABLET(5 MG) BY MOUTH TWICE DAILY, Disp: 180 tablet, Rfl: 1 .  estrogens, conjugated, (PREMARIN) 0.625 MG tablet, Take by mouth., Disp: , Rfl:  .  famotidine (PEPCID) 20 MG tablet, Take 20 mg by mouth as needed for heartburn or indigestion., Disp: , Rfl:  .  fluticasone (FLONASE) 50 MCG/ACT nasal spray, Place 1 spray into both nostrils daily., Disp: 16 g, Rfl: 2 .  Fluticasone Furoate (ARNUITY ELLIPTA) 100 MCG/ACT AEPB, Inhale 1 puff into the lungs daily., Disp: 30 each, Rfl: 10 .  furosemide (LASIX) 20 MG tablet, Take 2 tablets (40MG) by mouth  daily in the morning. May take additional 40 mg in the afternoon for weight gain >2 lb in 24 hours or 5 lbs over the course of 5 days., Disp: 180 tablet, Rfl: 3 .  gabapentin (NEURONTIN) 100 MG capsule, Take by mouth., Disp: , Rfl:  .  gabapentin (NEURONTIN) 300 MG capsule, daily., Disp: , Rfl:  .  guaiFENesin (MUCINEX) 600 MG 12 hr tablet, Take 600 mg by mouth 2 (two) times daily as needed for cough or to loosen phlegm. , Disp: , Rfl:  .  guaiFENesin (ROBITUSSIN) 100 MG/5ML SOLN, Take 10 mLs by mouth every 6 (six) hours as needed for cough or to loosen phlegm., Disp: , Rfl:  .  lidocaine (LMX) 4 % cream, Apply 1 application topically at bedtime., Disp: , Rfl:  .  Lidocaine-Glycerin (PREPARATION H EX), Apply topically daily as needed., Disp: , Rfl:  .  loperamide (IMODIUM A-D) 2 MG tablet, Take 2 mg by mouth as needed for diarrhea or loose stools (max 4 doses daily). , Disp: , Rfl:  .  loratadine (CLARITIN) 10 MG tablet, Take 10 mg by mouth daily., Disp: , Rfl:  .  LORazepam (ATIVAN) 0.5 MG tablet, Take 1 tablet (0.5 mg total) by mouth 2 (two) times daily as needed for anxiety. (Patient taking differently: Take 0.5 mg by mouth 2 (two) times daily. ), Disp: 60 tablet, Rfl: 2 .  losartan (COZAAR) 100 MG tablet, Take 1 tablet (100 mg total) by mouth daily., Disp: 30 tablet, Rfl: 2 .  lovastatin (MEVACOR) 40 MG tablet, Take 1 tablet (40 mg total) by mouth daily. (Patient taking differently: Take 40 mg by mouth every Monday, Wednesday, and Friday. ), Disp: 90 tablet, Rfl: 0 .  Lutein 10 MG TABS, Take 10 mg by mouth 2 (two) times daily., Disp: , Rfl:  .  magnesium hydroxide (MILK OF MAGNESIA) 400 MG/5ML suspension, Take 30 mLs by mouth at bedtime as needed for mild constipation., Disp: , Rfl:  .  mirabegron ER (MYRBETRIQ) 25 MG TB24 tablet, Take 25 mg by mouth daily., Disp: , Rfl:  .  neomycin-bacitracin-polymyxin (NEOSPORIN) 5-980 063 0422 ointment, Apply topically 4 (four) times daily as needed (wound care).,  Disp: , Rfl:  .  polyethylene glycol (MIRALAX / GLYCOLAX) 17 g packet, Take 17 g by mouth daily. , Disp: , Rfl:  .  Saccharomyces boulardii (PROBIOTIC) 250 MG CAPS, Take 1 capsule by mouth daily. , Disp: , Rfl:  .  Sodium Phosphates (ENEMA) 7-19 GM/118ML ENEM, Place rectally., Disp: , Rfl:  .  traMADol (ULTRAM) 50 MG tablet, Take 50 mg by mouth daily. , Disp: , Rfl:  .  triamcinolone (KENALOG) 0.025 % ointment, Apply 1 application topically as needed., Disp: ,  Rfl:  .  triamcinolone lotion (KENALOG) 0.1 %, Apply topically., Disp: , Rfl:  .  trimethoprim (TRIMPEX) 100 MG tablet, Take 1 tablet (100 mg total) by mouth daily., Disp: 30 tablet, Rfl: 11  Social History   Tobacco Use  Smoking Status Former Smoker  Smokeless Tobacco Never Used  Tobacco Comment   smoked in high school for a few years only    Allergies  Allergen Reactions  . 2,4-D Dimethylamine (Amisol) Other (See Comments)    Other Reaction: OTHER REACTION-IRRITATION TO Esophagous  . Celebrex [Celecoxib] Other (See Comments)     esophagitis Esophageal spasms    . Ciprofloxacin Other (See Comments)    Esophageal irritation  . Ibuprofen Other (See Comments)    Other reaction(s): Other (See Comments) reflux  . Levamisole Other (See Comments) and Nausea And Vomiting  . Metronidazole Other (See Comments)    neuropathy  . Naproxen Other (See Comments)    GI UPSET  . Other Other (See Comments)    Other Reaction: esophagitis   Objective:  There were no vitals filed for this visit. There is no height or weight on file to calculate BMI. Constitutional Well developed. Well nourished.  Vascular Dorsalis pedis pulses palpable bilaterally. Posterior tibial pulses palpable bilaterally. Capillary refill normal to all digits.  No cyanosis or clubbing noted. Pedal hair growth normal.  Neurologic Normal speech. Oriented to person, place, and time. Protective sensation absent  Dermatologic Wound Location: Right dorsal first  metatarsophalangeal joint with fat layer exposedDoes not probe down to bone. Mild redness noticed around the wound site Wound Base: Mixed Granular/Fibrotic Peri-wound: Reddened Exudate: Scant/small amount Serosanguinous exudate Wound Measurements: -See below  Orthopedic: No pain to palpation either foot.   Radiographs: None Assessment:   1. Ulcer of right foot with fat layer exposed ([place])    Plan:  Patient was evaluated and treated and all questions answered.  Ulcer right first MPJ plantar ulceration with fat layer exposed -Debridement as below. -Dressed with Betadine wet-to-dry, DSD. -Continue off-loading with surgical shoe. -Given that this wound is now resolving I believe patient will benefit from times medical graft application as she has been dealing with this even prior to see me for greater than 4 weeks. -I will work on authorization for the times medical graft. -She states that the facility that she is that she is not able to get anyone do dressing changes. Even with my instruction she is not able to do any Betadine wet-to-dry dressing changes.  Procedure: Excisional Debridement of Wound~stagnant Tool: Sharp chisel blade/tissue nipper Rationale: Removal of non-viable soft tissue from the wound to promote healing.  Anesthesia: none Pre-Debridement Wound Measurements: 1.1 cm x 1.1 cm x 0.3 cm  Post-Debridement Wound Measurements: 1.3 cm x 1.2 cm x 0.3 cm  Type of Debridement: Sharp Excisional Tissue Removed: Non-viable soft tissue Blood loss: Minimal (<50cc) Depth of Debridement: subcutaneous tissue. Technique: Sharp excisional debridement to bleeding, viable wound base.  Wound Progress: This is my initial evaluation I will continue monitor the progression of the wound. Site healing conversation 7 Dressing: Dry, sterile, compression dressing. Disposition: Patient tolerated procedure well. Patient to return in 1 week for follow-up.  No follow-ups on file.

## 2020-06-29 ENCOUNTER — Ambulatory Visit (INDEPENDENT_AMBULATORY_CARE_PROVIDER_SITE_OTHER): Payer: Medicare Other | Admitting: Podiatry

## 2020-06-29 ENCOUNTER — Encounter: Payer: Self-pay | Admitting: Podiatry

## 2020-06-29 ENCOUNTER — Ambulatory Visit: Payer: Medicare Other | Admitting: Podiatry

## 2020-06-29 ENCOUNTER — Other Ambulatory Visit: Payer: Self-pay

## 2020-06-29 DIAGNOSIS — L97512 Non-pressure chronic ulcer of other part of right foot with fat layer exposed: Secondary | ICD-10-CM | POA: Diagnosis not present

## 2020-06-30 ENCOUNTER — Encounter: Payer: Self-pay | Admitting: Podiatry

## 2020-06-30 NOTE — Progress Notes (Signed)
Subjective:  Patient ID: Jodi Bullock, female    DOB: 1924/11/19,  MRN: [id number]  Chief Complaint  Patient presents with  . Foot Ulcer    Follow up ulcer right foot       87 y.o. female presents for wound care. Patient presents with a follow-up of her right first metatarsophalangeal joint ulceration. Patient states is about the same.  She has been approved for tides medical graft.  We will begin application today.  Review of Systems: Negative except as noted in the HPI. Denies N/V/F/Ch.  Past Medical History:  Diagnosis Date  . Arthritis   . Atrial flutter ([place])    a. Dx 07/2018. CHA2DS2VASc = 5-->Eliquis.  . Bladder incontinence   . Breast cancer ([place]) 1998  . Chronic heart failure with improved ejection fraction (HFimpEF) 01/10/2016   a. 09/2015 Echo: EF 40-45%, possible inf HK, mod PAH (PASP 22mHg); b. 11/2018 Echo: EF 60-65%, RVSP 67mg. Mod dil LA, mildly dil RA. Mod MR.  . Cl[place] [facility] of right femur ([facility]   a. 08/2017->Managed conservatively.  . Marland KitchenOVID-19 virus infection    a. 05/2019 - asymptomatic.  . Marland Kitchenepression   . GERD (gastroesophageal reflux disease)   . History of kidney stones   . HLD (hyperlipidemia)   . Hypertension   . Neuropathy of both feet   . NICM ([place]ischemic cardiomyopathy) ([facility]   a. 08/2015 Lexiscan MV: EF 39%, no ischemia; b. 09/2015 Echo: EF 40-45%; c. 11/2018 Echo: EF 60-65%.  . Persistent atrial fibrillation ([facility]  . Right Humerus head fracture    a. 07/2016 - managed conservatively.  . 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  acetaminophen (TYLENOL) 500 MG tablet, Take 500 mg by mouth every 4 (four) hours as needed for mild pain or fever., Disp: , Rfl:  .  acetaminophen (TYLENOL) 650 MG CR tablet, Take 650 mg by mouth 3 (three) times daily., Disp: , Rfl:  .  acidophilus (RISAQUAD) CAPS capsule, Take by mouth daily., Disp: , Rfl:  .  alum & mag hydroxide-simeth (MINTOX) 20953-202-33G/5ML suspension, Take 30 mLs  by mouth at bedtime as needed for indigestion or heartburn., Disp: , Rfl:  .  azithromycin (ZITHROMAX) 250 MG tablet, Take 250 mg by mouth as directed., Disp: , Rfl:  .  carbamide peroxide (GLY-OXIDE) 10 % solution, Place 1 application onto teeth 4 (four) times daily -  with meals and at bedtime. (as needed for dry mouth), Disp: , Rfl:  .  carvedilol (COREG) 12.5 MG tablet, Take 12.5 mg by mouth 2 (two) times daily with a meal., Disp: , Rfl:  .  cephALEXin (KEFLEX) 500 MG capsule, Take 500 mg by mouth 3 (three) times daily., Disp: , Rfl:  .  Cholecalciferol 2000 UNITS TABS, Take 2,000 Units by mouth daily. , Disp: , Rfl:  .  conjugated estrogens (PREMARIN) vaginal cream, Apply 0.38m59mpea-sized amount)  just inside the vaginal introitus with a finger-tip on  Monday, Wednesday and Friday nights., Disp: 30 g, Rfl: 12 .  Cyanocobalamin (VITAMIN DEFICIENCY SYSTEM-B12) 1000 MCG/ML KIT, Inject 1,000 mcg as directed every 30 (thirty) days. , Disp: , Rfl:  .  diclofenac sodium (VOLTAREN) 1 % GEL, Apply 2 g topically 2 (two) times daily as needed (knee pain)., Disp: , Rfl:  .  docusate sodium (COLACE) 100 MG capsule, Take 100 mg by mouth at bedtime. , Disp: , Rfl:  .  doxycycline (VIBRA-TABS) 100 MG tablet,  Take 1 tablet (100 mg total) by mouth 2 (two) times daily., Disp: 28 tablet, Rfl: 0 .  ELIQUIS 5 MG TABS tablet, TAKE 1 TABLET(5 MG) BY MOUTH TWICE DAILY, Disp: 180 tablet, Rfl: 1 .  estrogens, conjugated, (PREMARIN) 0.625 MG tablet, Take by mouth., Disp: , Rfl:  .  famotidine (PEPCID) 20 MG tablet, Take 20 mg by mouth as needed for heartburn or indigestion., Disp: , Rfl:  .  fluticasone (FLONASE) 50 MCG/ACT nasal spray, Place 1 spray into both nostrils daily., Disp: 16 g, Rfl: 2 .  Fluticasone Furoate (ARNUITY ELLIPTA) 100 MCG/ACT AEPB, Inhale 1 puff into the lungs daily., Disp: 30 each, Rfl: 10 .  furosemide (LASIX) 20 MG tablet, Take 2 tablets ($RemoveBe'40MG'VhpGzSdir$ ) by mouth daily in the morning. May take additional 40  mg in the afternoon for weight gain >2 lb in 24 hours or 5 lbs over the course of 5 days., Disp: 180 tablet, Rfl: 3 .  gabapentin (NEURONTIN) 100 MG capsule, Take by mouth., Disp: , Rfl:  .  gabapentin (NEURONTIN) 300 MG capsule, daily., Disp: , Rfl:  .  guaiFENesin (MUCINEX) 600 MG 12 hr tablet, Take 600 mg by mouth 2 (two) times daily as needed for cough or to loosen phlegm. , Disp: , Rfl:  .  guaiFENesin (ROBITUSSIN) 100 MG/5ML SOLN, Take 10 mLs by mouth every 6 (six) hours as needed for cough or to loosen phlegm., Disp: , Rfl:  .  lidocaine (LMX) 4 % cream, Apply 1 application topically at bedtime., Disp: , Rfl:  .  Lidocaine-Glycerin (PREPARATION H EX), Apply topically daily as needed., Disp: , Rfl:  .  loperamide (IMODIUM A-D) 2 MG tablet, Take 2 mg by mouth as needed for diarrhea or loose stools (max 4 doses daily). , Disp: , Rfl:  .  loratadine (CLARITIN) 10 MG tablet, Take 10 mg by mouth daily., Disp: , Rfl:  .  LORazepam (ATIVAN) 0.5 MG tablet, Take 1 tablet (0.5 mg total) by mouth 2 (two) times daily as needed for anxiety. (Patient taking differently: Take 0.5 mg by mouth 2 (two) times daily. ), Disp: 60 tablet, Rfl: 2 .  losartan (COZAAR) 100 MG tablet, Take 1 tablet (100 mg total) by mouth daily., Disp: 30 tablet, Rfl: 2 .  lovastatin (MEVACOR) 40 MG tablet, Take 1 tablet (40 mg total) by mouth daily. (Patient taking differently: Take 40 mg by mouth every Monday, Wednesday, and Friday. ), Disp: 90 tablet, Rfl: 0 .  Lutein 10 MG TABS, Take 10 mg by mouth 2 (two) times daily., Disp: , Rfl:  .  magnesium hydroxide (MILK OF MAGNESIA) 400 MG/5ML suspension, Take 30 mLs by mouth at bedtime as needed for mild constipation., Disp: , Rfl:  .  mirabegron ER (MYRBETRIQ) 25 MG TB24 tablet, Take 25 mg by mouth daily., Disp: , Rfl:  .  neomycin-bacitracin-polymyxin (NEOSPORIN) [phone] ointment, Apply topically 4 (four) times daily as needed (wound care)., Disp: , Rfl:  .  polyethylene glycol  (MIRALAX / GLYCOLAX) 17 g packet, Take 17 g by mouth daily. , Disp: , Rfl:  .  Saccharomyces boulardii (PROBIOTIC) 250 MG CAPS, Take 1 capsule by mouth daily. , Disp: , Rfl:  .  Sodium Phosphates (ENEMA) 7-19 GM/118ML ENEM, Place rectally., Disp: , Rfl:  .  traMADol (ULTRAM) 50 MG tablet, Take 50 mg by mouth daily. , Disp: , Rfl:  .  triamcinolone (KENALOG) 0.025 % ointment, Apply 1 application topically as needed., Disp: , Rfl:  .  triamcinolone  lotion (KENALOG) 0.1 %, Apply topically., Disp: , Rfl:  .  trimethoprim (TRIMPEX) 100 MG tablet, Take 1 tablet (100 mg total) by mouth daily., Disp: 30 tablet, Rfl: 11  Social History   Tobacco Use  Smoking Status Former Smoker  Smokeless Tobacco Never Used  Tobacco Comment   smoked in high school for a few years only    Allergies  Allergen Reactions  . 2,4-D Dimethylamine (Amisol) Other (See Comments)    Other Reaction: OTHER REACTION-IRRITATION TO Esophagous  . Celebrex [Celecoxib] Other (See Comments)     esophagitis Esophageal spasms    . Ciprofloxacin Other (See Comments)    Esophageal irritation  . Ibuprofen Other (See Comments)    Other reaction(s): Other (See Comments) reflux  . Levamisole Other (See Comments) and Nausea And Vomiting  . Metronidazole Other (See Comments)    neuropathy  . Naproxen Other (See Comments)    GI UPSET  . Other Other (See Comments)    Other Reaction: esophagitis   Objective:  There were no vitals filed for this visit. There is no height or weight on file to calculate BMI. Constitutional Well developed. Well nourished.  Vascular Dorsalis pedis pulses palpable bilaterally. Posterior tibial pulses palpable bilaterally. Capillary refill normal to all digits.  No cyanosis or clubbing noted. Pedal hair growth normal.  Neurologic Normal speech. Oriented to person, place, and time. Protective sensation absent  Dermatologic Wound Location: Right dorsal first metatarsophalangeal joint with fat layer  exposedDoes not probe down to bone. Mild redness noticed around the wound site Wound Base: Mixed Granular/Fibrotic Peri-wound: Reddened Exudate: Scant/small amount Serosanguinous exudate Wound Measurements: -See below  Orthopedic: No pain to palpation either foot.   Radiographs: None Assessment:   1. Ulcer of right foot with fat layer exposed ([place])    Plan:  Patient was evaluated and treated and all questions answered.  Ulcer right first MPJ plantar ulceration with fat layer exposed -Debridement as below. -Dressed with Betadine wet-to-dry, DSD. -Continue off-loading with surgical shoe. -We will continue to apply tides medical graft and to resolve meant.  Application of synthetic skin graft/substitute  Name: Tides Medical Graft  Usage: 2 x 2 cm Graft was applied directly to the listed above wound site and secured with Adaptic, kerlix, Ace bandage. Waste: No graft material was wasted and was applied in its entirety.   Procedure: Excisional Debridement of Wound~stagnant Tool: Sharp chisel blade/tissue nipper Rationale: Removal of non-viable soft tissue from the wound to promote healing.  Anesthesia: none Pre-Debridement Wound Measurements: 1.1 cm x 1.1 cm x 0.3 cm  Post-Debridement Wound Measurements: 1.3 cm x 1.2 cm x 0.3 cm  Type of Debridement: Sharp Excisional Tissue Removed: Non-viable soft tissue Blood loss: Minimal (<50cc) Depth of Debridement: subcutaneous tissue. Technique: Sharp excisional debridement to bleeding, viable wound base.  Wound Progress: The wound has been stagnant.  I applied the first application of tides medical skin graft Dressing: Dry, sterile, compression dressing. Disposition: Patient tolerated procedure well. Patient to return in 1 week for follow-up.  No follow-ups on file.

## 2020-07-08 ENCOUNTER — Other Ambulatory Visit: Payer: Self-pay

## 2020-07-08 ENCOUNTER — Ambulatory Visit (INDEPENDENT_AMBULATORY_CARE_PROVIDER_SITE_OTHER): Payer: Medicare Other | Admitting: Cardiovascular Disease

## 2020-07-08 ENCOUNTER — Encounter: Payer: Self-pay | Admitting: Cardiovascular Disease

## 2020-07-08 VITALS — BP 140/70 | HR 52 | Ht 66.0 in | Wt 146.0 lb

## 2020-07-08 DIAGNOSIS — I4821 Permanent atrial fibrillation: Secondary | ICD-10-CM | POA: Diagnosis not present

## 2020-07-08 DIAGNOSIS — I10 Essential (primary) hypertension: Secondary | ICD-10-CM | POA: Diagnosis not present

## 2020-07-08 DIAGNOSIS — I5022 Chronic systolic (congestive) heart failure: Secondary | ICD-10-CM

## 2020-07-08 MED ORDER — FUROSEMIDE 40 MG PO TABS: 40.0000 mg | ORAL_TABLET | Freq: Every day | ORAL | Status: DC

## 2020-07-08 MED ORDER — CARVEDILOL 6.25 MG PO TABS: 6.2500 mg | ORAL_TABLET | Freq: Two times a day (BID) | ORAL | Status: DC

## 2020-07-08 NOTE — Patient Instructions (Signed)
Medication Instructions:  Your physician has recommended you make the following change in your medication:   1) INCREASE Lasix to 40 mg daily.  2) DECREASE Carvedilol to 6.25 mg twice daily.   *If you need a refill on your cardiac medications before your next appointment, please call your pharmacy*   Lab Work: None ordered If you have labs (blood work) drawn today and your tests are completely normal, you will receive your results only by: Marland Kitchen MyChart Message (if you have MyChart) OR . A paper copy in the mail If you have any lab test that is abnormal or we need to change your treatment, we will call you to review the results.   Testing/Procedures: None ordered   Follow-Up: At [facility], you and your health needs are our priority.  As part of our continuing mission to provide you with exceptional heart care, we have created designated Provider Care Teams.  These Care Teams include your primary Cardiologist (physician) and Advanced Practice Providers (APPs -  Physician Assistants and Nurse Practitioners) who all work together to provide you with the care you need, when you need it.  We recommend signing up for the patient portal called "MyChart".  Sign up information is provided on this After Visit Summary.  MyChart is used to connect with patients for Virtual Visits (Telemedicine).  Patients are able to view lab/test results, encounter notes, upcoming appointments, etc.  Non-urgent messages can be sent to your provider as well.   To learn more about what you can do with MyChart, go to [email or web address].    Your next appointment:   4 month(s)  The format for your next appointment:   In Person  Provider:   You may see Kathlyn Sacramento, MD or one of the following Advanced Practice Providers on your designated Care Team:    Murray Hodgkins, NP  Christell Faith, PA-C  Marrianne Mood, PA-C  Cadence Altura, [place]  Laurann Montana, NP    Other Instructions N/A

## 2020-07-08 NOTE — Progress Notes (Signed)
Cardiology Office Note   Date:  07/08/2020   ID:  Jodi Bullock 01-17-1925, MRN [id number]  PCP:  Jodi Bullock, [facility]  Cardiologist:  Jodi Sacramento, MD   Chief Complaint  Patient presents with  . 3 month follow up     Patient c/o LE edema and shortness of breath with little to no exertion. Medications reviewed by the patient verbally.       History of Present Illness: Jodi Bullock is a 87 y.o. female who presents for a follow-up visit regarding chronic systolic heart failure and permanent atrial fibrillation/flutter. Other medical conditions include essential hypertension, hyperlipidemia, thymus cancer, right breast cancer and COVID-19 infection in January 2021. Previous echocardiogram in 2017 showed an EF of 45 to 50% with possible inferior wall hypokinesis.  She was diagnosed with atrial flutter in March 2020.  She was treated with rate control and anticoagulation.  Most recent echocardiogram in July 2020 showed an EF of 60 to 65% with pulmonary systolic pressure of 61 mmHg. She was seen in September for weight gain and increased shortness of breath with elevated blood pressure.  She improved with additional diuretics. She seems to be significantly stressed today and she reports that her husband is dying.  She reports worsening exertional dyspnea and leg edema.  She is supposed to be on furosemide 40 mg once daily but reports that they only give her 20 mg daily at [facility].  No chest pain.  Past Medical History:  Diagnosis Date  . Arthritis   . Atrial flutter ([place])    a. Dx 07/2018. CHA2DS2VASc = 5-->Eliquis.  . Bladder incontinence   . Breast cancer ([place]) 1998  . Chronic heart failure with improved ejection fraction (HFimpEF) 01/10/2016   a. 09/2015 Echo: EF 40-45%, possible inf HK, mod PAH (PASP 35mHg); b. 11/2018 Echo: EF 60-65%, RVSP 667mg. Mod dil LA, mildly dil RA. Mod MR.  . Closed fracture of right femur ([facility]   a. 08/2017->Managed conservatively.   . Marland KitchenOVID-19 virus infection    a. 05/2019 - asymptomatic.  . Marland Kitchenepression   . GERD (gastroesophageal reflux disease)   . History of kidney stones   . HLD (hyperlipidemia)   . Hypertension   . Neuropathy of both feet   . NICM (nonischemic cardiomyopathy) ([facility]   a. 08/2015 Lexiscan MV: EF 39%, no ischemia; b. 09/2015 Echo: EF 40-45%; c. 11/2018 Echo: EF 60-65%.  . Persistent atrial fibrillation ([facility]  . Right Humerus head fracture    a. 07/2016 - managed conservatively.  . 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   [facility]    Past Surgical History:  Procedure Laterality Date  . ABDOMINAL HYSTERECTOMY    . BREAST LUMPECTOMY    . CATARACT EXTRACTION    . CHOLECYSTECTOMY    . JOINT REPLACEMENT Right    Hip  . KNEE SURGERY Right      Current Outpatient Medications  Medication Sig Dispense Refill  . acetaminophen (TYLENOL) 500 MG tablet Take 500 mg by mouth every 4 (four) hours as needed for mild pain or fever.    . Marland Kitchencetaminophen (TYLENOL) 650 MG CR tablet Take 650 mg by mouth 3 (three) times daily.    . Marland Kitchencidophilus (RISAQUAD) CAPS capsule Take by mouth daily.    . Marland Kitchenlum & mag hydroxide-simeth (MAALOX/MYLANTA) 200-200-20 MG/5ML suspension Take 30 mLs by mouth at bedtime as needed for indigestion or heartburn.    . carbamide peroxide (GLY-OXIDE) 10 % solution Place 1 application onto teeth 4 (  four) times daily -  with meals and at bedtime. (as needed for dry mouth)    . carvedilol (COREG) 12.5 MG tablet Take 12.5 mg by mouth 2 (two) times daily with a meal.    . Cholecalciferol 2000 UNITS TABS Take 2,000 Units by mouth daily.     Marland Kitchen conjugated estrogens (PREMARIN) vaginal cream Apply 0.67m (pea-sized amount)  just inside the vaginal introitus with a finger-tip on  Monday, Wednesday and Friday nights. 30 g 12  . Cyanocobalamin 1000 MCG/ML KIT Inject 1,000 mcg as directed every 30 (thirty) days.     . diclofenac sodium (VOLTAREN) 1 % GEL Apply 2 g topically 2 (two) times daily as needed (knee pain).    .Marland Kitchen docusate sodium (COLACE) 100 MG capsule Take 100 mg by mouth at bedtime.     .Marland KitchenELIQUIS 5 MG TABS tablet TAKE 1 TABLET(5 MG) BY MOUTH TWICE DAILY 180 tablet 1  . estrogens, conjugated, (PREMARIN) 0.625 MG tablet Take by mouth.    . famotidine (PEPCID) 20 MG tablet Take 20 mg by mouth as needed for heartburn or indigestion.    . fluticasone (FLONASE) 50 MCG/ACT nasal spray Place 1 spray into both nostrils daily. 16 g 2  . Fluticasone Furoate (ARNUITY ELLIPTA) 100 MCG/ACT AEPB Inhale 1 puff into the lungs daily. 30 each 10  . furosemide (LASIX) 20 MG tablet Take 2 tablets (40MG) by mouth daily in the morning. May take additional 40 mg in the afternoon for weight gain >2 lb in 24 hours or 5 lbs over the course of 5 days. 180 tablet 3  . gabapentin (NEURONTIN) 300 MG capsule daily.    .Marland KitchenguaiFENesin (MUCINEX) 600 MG 12 hr tablet Take 600 mg by mouth 2 (two) times daily as needed for cough or to loosen phlegm.     .Marland KitchenguaiFENesin (ROBITUSSIN) 100 MG/5ML SOLN Take 10 mLs by mouth every 6 (six) hours as needed for cough or to loosen phlegm.    . lidocaine (LMX) 4 % cream Apply 1 application topically at bedtime.    . Lidocaine-Glycerin (PREPARATION H EX) Apply topically daily as needed.    . loperamide (IMODIUM A-D) 2 MG tablet Take 2 mg by mouth as needed for diarrhea or loose stools (max 4 doses daily).     .Marland Kitchenloratadine (CLARITIN) 10 MG tablet Take 10 mg by mouth daily.    .Marland KitchenLORazepam (ATIVAN) 0.5 MG tablet Take 1 tablet (0.5 mg total) by mouth 2 (two) times daily as needed for anxiety. 60 tablet 2  . losartan (COZAAR) 100 MG tablet Take 1 tablet (100 mg total) by mouth daily. 30 tablet 2  . lovastatin (MEVACOR) 40 MG tablet Take 1 tablet (40 mg total) by mouth daily. 90 tablet 0  . Lutein 10 MG TABS Take 10 mg by mouth 2 (two) times daily.    . magnesium hydroxide (MILK OF MAGNESIA) 400 MG/5ML suspension Take 30 mLs by mouth at bedtime as needed for mild constipation.    . mirabegron ER (MYRBETRIQ) 25 MG  TB24 tablet Take 25 mg by mouth daily.    .Marland Kitchenneomycin-bacitracin-polymyxin (NEOSPORIN) [phone] ointment Apply topically 4 (four) times daily as needed (wound care).    . polyethylene glycol (MIRALAX / GLYCOLAX) 17 g packet Take 17 g by mouth daily.     . Saccharomyces boulardii (PROBIOTIC) 250 MG CAPS Take 1 capsule by mouth daily.     . Sodium Phosphates (ENEMA) 7-19 GM/118ML ENEM Place rectally.    .Marland Kitchen  traMADol (ULTRAM) 50 MG tablet Take 50 mg by mouth daily.     Marland Kitchen triamcinolone (KENALOG) 0.025 % ointment Apply 1 application topically as needed.    . triamcinolone lotion (KENALOG) 0.1 % Apply topically.    Marland Kitchen trimethoprim (TRIMPEX) 100 MG tablet Take 1 tablet (100 mg total) by mouth daily. 30 tablet 11   No current facility-administered medications for this visit.    Allergies:   2,4-d dimethylamine (amisol); Celebrex [celecoxib]; Ciprofloxacin; Ibuprofen; Levamisole; Metronidazole; Naproxen; and Other    Social History:  The patient  reports that she has quit smoking. She has never used smokeless tobacco. She reports that she does not drink alcohol and does not use drugs.   Family History:  The patient's family history includes Diabetes in her father; Heart disease in her father; Stroke in her mother.    ROS:  Please see the history of present illness.   Otherwise, review of systems are positive for none.   All other systems are reviewed and negative.    PHYSICAL EXAM: VS:  BP 140/70 (BP Location: Left Arm, Patient Position: Sitting, Cuff Size: Normal)   Pulse (!) 52   Ht '5\' 6"'  (1.676 m)   Wt 146 lb (66.2 kg)   SpO2 97%   BMI 23.57 kg/m  , BMI Body mass index is 23.57 kg/m. GEN: Well nourished, well developed, in no acute distress  HEENT: normal  Neck: Mild JVD, carotid bruits, or masses Cardiac: Irregularly irregular; no murmurs, rubs, or gallops, moderate bilateral leg edema Respiratory:  clear to auscultation bilaterally, normal work of breathing GI: soft, nontender,  nondistended, + BS MS: no deformity or atrophy  Skin: warm and dry, no rash Neuro:  Strength and sensation are intact Psych: euthymic mood, full affect   EKG:  EKG is ordered today. The ekg ordered today demonstrates atrial fibrillation with ventricular rate of 52 bpm.  Possible old inferior infarct.   Recent Labs: 03/17/2020: ALT 14; BUN 16; Creatinine, Ser 0.71; Hemoglobin 14.2; Platelets 152; Potassium 4.6; Sodium 142    Lipid Panel    Component Value Date/Time   CHOL 139 05/24/2017 1049   CHOL 131 06/04/2015 0810   TRIG 163 (H) 05/24/2017 1049   HDL 48 (L) 05/24/2017 1049   HDL 54 06/04/2015 0810   CHOLHDL 2.9 05/24/2017 1049   VLDL 29 10/11/2016 0804   LDLCALC 67 05/24/2017 1049      Wt Readings from Last 3 Encounters:  07/08/20 146 lb (66.2 kg)  03/25/20 145 lb (65.8 kg)  03/17/20 146 lb (66.2 kg)      No flowsheet data found.    ASSESSMENT AND PLAN:   1.  Permanent atrial fibrillation: Ventricular rate continues to be slow.  I elected to decrease carvedilol to 6.25 mg twice daily.  Continue anticoagulation with Eliquis.  Reviewed her most recent labs done in November which showed normal renal function and normal CBC.  2.  Chronic systolic/diastolic heart failure: Most recent ejection fraction was normal.  She appears to be mildly volume overloaded.  I increase furosemide to 40 mg daily.   3.  Essential hypertension: Blood pressure is reasonably controlled.   Disposition:   FU with APP in 4 months.  Signed,  Jodi Sacramento, MD  07/08/2020 2:44 PM    [place]

## 2020-07-09 ENCOUNTER — Encounter: Payer: Self-pay | Admitting: Cardiovascular Disease

## 2020-07-13 ENCOUNTER — Ambulatory Visit: Payer: Medicare Other | Admitting: Podiatry

## 2020-07-23 ENCOUNTER — Telehealth: Payer: Self-pay | Admitting: Cardiovascular Disease

## 2020-07-23 NOTE — Telephone Encounter (Signed)
Pt c/o medication issue:  1. Name of Medication:    Eliquis  5 m po BID and furosemide 40 mg po q d   2. How are you currently taking this medication (dosage and times per day)?  See above   3. Are you having a reaction (difficulty breathing--STAT)? No   4. What is your medication issue? Patient  Calling to discuss medication .    Son told her she can no longer afford eliquis and needs to change to something cheaper.    Patient also wants someone to send an order to [facility] to decrease furosemide back to 20 mg for a while.    She states she cannot leave home due to frequent urination and heavy urination.  Last weight 138 lbs. Per patient .   Also, Patient just lost spouse.  She wants Murray Hodgkins to be aware of this and above .

## 2020-07-23 NOTE — Telephone Encounter (Signed)
Attempted to call pt back. No answer. Lmtcb.  

## 2020-07-29 NOTE — Telephone Encounter (Signed)
I'm very sorry to hear about [name].  Please send my condolences and perhaps the office can put a card together for Jodi Bullock.  Ok to reduce lasix to 20mg  daily and 20mg  daily prn wt gain of 2 lbs or greater.  Re: eliquis, options might include warfarin (with INR f/u - could likely be managed by team @ [facility] if transportation still difficult) vs seeing if xarelto might be less expensive for her.

## 2020-07-29 NOTE — Telephone Encounter (Signed)
Left voicemail message to call back to discuss any medication changes.

## 2020-07-29 NOTE — Telephone Encounter (Signed)
Left voicemail message to call back  

## 2020-07-29 NOTE — Telephone Encounter (Signed)
Called number in chart which was to [place] at facility. Patient is managed by [facility]. Baxter Flattery states that any changes in medications need to be made by the facility doctor that prescribes changes there in their facility. Reviewed patients call and requests. She has put patient down to have review by provider there and will discuss our recommendations as well regarding reducing furosemide and possible changes with Eliquis. Advised I would let provider know and would try to call her to provide condolences. She was appreciative for call and updates on potential changes so she can review with their provider.

## 2020-07-30 NOTE — Telephone Encounter (Signed)
Called numbers available and patient number is being switched per son. Left voicemail message on number provided by son per release form.   Multiple attempts to call patient have been made and facility administrator aware of recommendations. Will close this encounter for now since we have not reached her at numbers listed.

## 2020-08-02 ENCOUNTER — Telehealth: Payer: Self-pay | Admitting: Nurse Practitioner

## 2020-08-02 NOTE — Telephone Encounter (Signed)
Letter given to provider. Reviewed that I have tried reaching this patient but goes to voicemail.

## 2020-08-02 NOTE — Telephone Encounter (Signed)
Patient sent letter via [employer], stating that she is having to pay for her Eliquis now. States that she is having to '"cut back". Delivered the letter to Jule Ser, RN. Patient requests Murray Hodgkins, NP to call her to discuss this.

## 2020-08-03 NOTE — Telephone Encounter (Signed)
   Ms. Mun called to speak with me this afternoon.  Her husband recently passed.  She is considering changing facilities as she remains dissatisfied with the care provided.  She has two main concerns today:  1.  She is having to change insurance and her son has advised that she will not be able to afford eliquis going forward.  She would be willing to switch to warfarin.  We discussed that ideally, we'd enroll [facility], who provide her primary care, to Rx and follow INRs as appropriate.  Transportation has historically been difficult for Ms. Fringer, but she says she'd be willing to try and make it work so that INR could be followed in our anticoagulation clinic.  Before making any changes, she has asked me to reach out to her son Marya Amsler, who is also POA.  2.  She is currently taking lasix 40mg  daily and notes that her wt has been coming down and she doesn't think she needs that much anymore.  She like to go back to taking 20mg  daily w/ 20mg  daily prn wt gain.  I think that that is reasonable and will work with our nsg team to fax over an order.  Caller verbalized understanding and was grateful for the call back.  Murray Hodgkins, NP 08/03/2020, 1:29 PM

## 2020-08-04 NOTE — Telephone Encounter (Signed)
Spoke with Engineer, manufacturing systems for [employer] to see if they are able to manage and monitor her coumadin checks. She states they are not able to manage these checks. Reviewed patients request to switch and how regular checks would be needed. Inquired about transportation for these regular checks and she states they are able to do that as long as they are aware of her appointments. Also discussed changes and orders we wish to fax over to their office and number for transportation. She provided fax number of [phone] and for transportation request to call [phone] to schedule appointments and transportation requests. She states at times patient does not notify them of appointments in time to set up transportation. Advised I would be happy to notify them of upcoming appointments. She verbalized understanding with no further questions.

## 2020-08-04 NOTE — Telephone Encounter (Signed)
Spoke with patients son Jodi Bullock per release form and reviewed her conversation with the provider. He did report her Eliquis was expensive but there have been some changes in her coverage and he wants to wait for now. He will reevaluate how much her insurance will cover once that new coverage starts. Expressed the need for her to be on something to reduce her risk of stroke. He verbalized understanding and wants to wait for about a month or so to see what their cost will be. He is going to call us in about 6 weeks once he finds out the coverage with her new insurance. Requested that he please call his mother to update her of this and that he can call us with any further changes or requests. He was appreciative for the call with no further questions at this time.

## 2020-08-04 NOTE — Telephone Encounter (Signed)
Reached out to [facility] to discuss some potential orders for this patient. Practice administrator not currently in office. Will verify where to send orders for [facility] and discuss regular checks. Left my name and number for her to call back.

## 2020-08-05 ENCOUNTER — Ambulatory Visit (INDEPENDENT_AMBULATORY_CARE_PROVIDER_SITE_OTHER): Payer: Medicare Other | Admitting: Podiatry

## 2020-08-05 ENCOUNTER — Encounter: Payer: Self-pay | Admitting: Podiatry

## 2020-08-05 ENCOUNTER — Other Ambulatory Visit: Payer: Self-pay

## 2020-08-05 ENCOUNTER — Ambulatory Visit: Payer: Medicare Other | Admitting: Podiatry

## 2020-08-05 DIAGNOSIS — L97512 Non-pressure chronic ulcer of other part of right foot with fat layer exposed: Secondary | ICD-10-CM

## 2020-08-05 NOTE — Progress Notes (Signed)
Subjective:  Patient ID: Jodi Bullock, female    DOB: 16-Jun-1924,  MRN: [id number]  Chief Complaint  Patient presents with  . Foot Ulcer    Its looking much better    87 y.o. female presents for wound care. Patient presents with a follow-up of her right first metatarsophalangeal joint ulceration.  She states she is doing well.  She was lost to follow-up because her husband died.  She just wants to get it evaluated make sure the foot is doing well.  She denies any other acute complaints.  Review of Systems: Negative except as noted in the HPI. Denies N/V/F/Ch.  Past Medical History:  Diagnosis Date  . Arthritis   . Atrial flutter ([place])    a. Dx 07/2018. CHA2DS2VASc = 5-->Eliquis.  . Bladder incontinence   . Breast cancer ([place]) 1998  . Chronic heart failure with improved ejection fraction (HFimpEF) 01/10/2016   a. 09/2015 Echo: EF 40-45%, possible inf HK, mod PAH (PASP 90mHg); b. 11/2018 Echo: EF 60-65%, RVSP 648mg. Mod dil LA, mildly dil RA. Mod MR.  . Closed fracture of right femur ([facility]   a. 08/2017->Managed conservatively.  . Marland KitchenOVID-19 virus infection    a. 05/2019 - asymptomatic.  . Marland Kitchenepression   . GERD (gastroesophageal reflux disease)   . History of kidney stones   . HLD (hyperlipidemia)   . Hypertension   . Neuropathy of both feet   . NIC[place] ([place]mic cardiomyopathy) ([place]   a. 08/2015 Lexiscan MV: EF 39%, no ischemia; b. 09/2015 Echo: EF 40-45%; c. 11/2018 Echo: EF 60-65%.  . Persistent atrial fibrillation ([facility] [place]  . Right Humerus head fracture    a. 07/2016 - managed conservatively.  . 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  acetaminophen (TYLENOL) 500 MG tablet, Take 500 mg by mouth every 4 (four) hours as needed for mild pain or fever., Disp: , Rfl:  .  acetaminophen (TYLENOL) 650 MG CR tablet, Take 650 mg by mouth 3 (three) times daily., Disp: , Rfl:  .  acidophilus (RISAQUAD) CAPS capsule, Take by mouth daily., Disp: , Rfl:  .  alum &  mag hydroxide-simeth (MAALOX/MYLANTA) 200-200-20 MG/5ML suspension, Take 30 mLs by mouth at bedtime as needed for indigestion or heartburn., Disp: , Rfl:  .  carbamide peroxide (GLY-OXIDE) 10 % solution, Place 1 application onto teeth 4 (four) times daily -  with meals and at bedtime. (as needed for dry mouth), Disp: , Rfl:  .  carvedilol (COREG) 6.25 MG tablet, Take 1 tablet (6.25 mg total) by mouth 2 (two) times daily with a meal., Disp: , Rfl:  .  Cholecalciferol 2000 UNITS TABS, Take 2,000 Units by mouth daily. , Disp: , Rfl:  .  conjugated estrogens (PREMARIN) vaginal cream, Apply 0.17m617mpea-sized amount)  just inside the vaginal introitus with a finger-tip on  Monday, Wednesday and Friday nights., Disp: 30 g, Rfl: 12 .  Cyanocobalamin 1000 MCG/ML KIT, Inject 1,000 mcg as directed every 30 (thirty) days. , Disp: , Rfl:  .  diclofenac sodium (VOLTAREN) 1 % GEL, Apply 2 g topically 2 (two) times daily as needed (knee pain)., Disp: , Rfl:  .  docusate sodium (COLACE) 100 MG capsule, Take 100 mg by mouth at bedtime. , Disp: , Rfl:  .  ELIQUIS 5 MG TABS tablet, TAKE 1 TABLET(5 MG) BY MOUTH TWICE DAILY, Disp: 180 tablet, Rfl: 1 .  estrogens, conjugated, (PREMARIN) 0.625 MG tablet, Take by mouth.,  Disp: , Rfl:  .  famotidine (PEPCID) 20 MG tablet, Take 20 mg by mouth as needed for heartburn or indigestion., Disp: , Rfl:  .  fluticasone (FLONASE) 50 MCG/ACT nasal spray, Place 1 spray into both nostrils daily., Disp: 16 g, Rfl: 2 .  Fluticasone Furoate (ARNUITY ELLIPTA) 100 MCG/ACT AEPB, Inhale 1 puff into the lungs daily., Disp: 30 each, Rfl: 10 .  furosemide (LASIX) 40 MG tablet, Take 1 tablet (40 mg total) by mouth daily., Disp: , Rfl:  .  gabapentin (NEURONTIN) 300 MG capsule, daily., Disp: , Rfl:  .  guaiFENesin (MUCINEX) 600 MG 12 hr tablet, Take 600 mg by mouth 2 (two) times daily as needed for cough or to loosen phlegm. , Disp: , Rfl:  .  guaiFENesin (ROBITUSSIN) 100 MG/5ML SOLN, Take 10 mLs by  mouth every 6 (six) hours as needed for cough or to loosen phlegm., Disp: , Rfl:  .  lidocaine (LMX) 4 % cream, Apply 1 application topically at bedtime., Disp: , Rfl:  .  Lidocaine-Glycerin (PREPARATION H EX), Apply topically daily as needed., Disp: , Rfl:  .  loperamide (IMODIUM A-D) 2 MG tablet, Take 2 mg by mouth as needed for diarrhea or loose stools (max 4 doses daily). , Disp: , Rfl:  .  loratadine (CLARITIN) 10 MG tablet, Take 10 mg by mouth daily., Disp: , Rfl:  .  LORazepam (ATIVAN) 0.5 MG tablet, Take 1 tablet (0.5 mg total) by mouth 2 (two) times daily as needed for anxiety., Disp: 60 tablet, Rfl: 2 .  losartan (COZAAR) 100 MG tablet, Take 1 tablet (100 mg total) by mouth daily., Disp: 30 tablet, Rfl: 2 .  lovastatin (MEVACOR) 40 MG tablet, Take 1 tablet (40 mg total) by mouth daily., Disp: 90 tablet, Rfl: 0 .  Lutein 10 MG TABS, Take 10 mg by mouth 2 (two) times daily., Disp: , Rfl:  .  magnesium hydroxide (MILK OF MAGNESIA) 400 MG/5ML suspension, Take 30 mLs by mouth at bedtime as needed for mild constipation., Disp: , Rfl:  .  mirabegron ER (MYRBETRIQ) 25 MG TB24 tablet, Take 25 mg by mouth daily., Disp: , Rfl:  .  neomycin-bacitracin-polymyxin (NEOSPORIN) [phone] ointment, Apply topically 4 (four) times daily as needed (wound care)., Disp: , Rfl:  .  polyethylene glycol (MIRALAX / GLYCOLAX) 17 g packet, Take 17 g by mouth daily. , Disp: , Rfl:  .  Saccharomyces boulardii (PROBIOTIC) 250 MG CAPS, Take 1 capsule by mouth daily. , Disp: , Rfl:  .  Sodium Phosphates (ENEMA) 7-19 GM/118ML ENEM, Place rectally., Disp: , Rfl:  .  traMADol (ULTRAM) 50 MG tablet, Take 50 mg by mouth daily. , Disp: , Rfl:  .  triamcinolone (KENALOG) 0.025 % ointment, Apply 1 application topically as needed., Disp: , Rfl:  .  triamcinolone lotion (KENALOG) 0.1 %, Apply topically., Disp: , Rfl:  .  trimethoprim (TRIMPEX) 100 MG tablet, Take 1 tablet (100 mg total) by mouth daily., Disp: 30 tablet, Rfl:  11  Social History   Tobacco Use  Smoking Status Former Smoker  Smokeless Tobacco Never Used  Tobacco Comment   smoked in high school for a few years only    Allergies  Allergen Reactions  . 2,4-D Dimethylamine (Amisol) Other (See Comments)    Other Reaction: OTHER REACTION-IRRITATION TO Esophagous  . Celebrex [Celecoxib] Other (See Comments)     esophagitis Esophageal spasms    . Ciprofloxacin Other (See Comments)    Esophageal irritation  . Ibuprofen  Other (See Comments)    Other reaction(s): Other (See Comments) reflux  . Levamisole Other (See Comments) and Nausea And Vomiting  . Metronidazole Other (See Comments)    neuropathy  . Naproxen Other (See Comments)    GI UPSET  . Other Other (See Comments)    Other Reaction: esophagitis   Objective:  There were no vitals filed for this visit. There is no height or weight on file to calculate BMI. Constitutional Well developed. Well nourished.  Vascular Dorsalis pedis pulses palpable bilaterally. Posterior tibial pulses palpable bilaterally. Capillary refill normal to all digits.  No cyanosis or clubbing noted. Pedal hair growth normal.  Neurologic Normal speech. Oriented to person, place, and time. Protective sensation absent  Dermatologic Wound Location: Right first dorsal first metatarsophalangeal joint ulcer completely reepithelialized.  No further ulceration noted.  Orthopedic: No pain to palpation either foot.   Radiographs: None Assessment:   1. Ulcer of right foot with fat layer exposed ([place])    Plan:  Patient was evaluated and treated and all questions answered.  Ulcer right first MPJ plantar ulceration with fat layer exposed -Clinically healed.  No further ulceration noted.  I discussed shoe gear modification with her in extensive detail.  If any foot and ankle issues arise in the future to come back and see me.  Patient states understanding. No follow-ups on file.

## 2020-09-21 ENCOUNTER — Telehealth: Payer: Self-pay | Admitting: Cardiovascular Disease

## 2020-09-21 NOTE — Telephone Encounter (Signed)
   Patient Name: Jodi Bullock  DOB: Jan 03, 1925  MRN: [id number]   Primary Cardiologist: Kathlyn Sacramento, MD  Chart reviewed as part of pre-operative protocol coverage. Simple dental extractions are considered low risk procedures per guidelines and generally do not require any specific cardiac clearance. It is also generally accepted that for simple extractions and dental cleanings, there is no need to interrupt blood thinner therapy.  SBE prophylaxis is not required for the patient from a cardiac standpoint.  I will route this recommendation to the requesting party via Epic fax function and remove from pre-op pool.  Please call with questions.  Darreld Mclean, PA-C 09/21/2020, 3:51 PM

## 2020-09-21 NOTE — Telephone Encounter (Signed)
   [facility] Pre-operative Risk Assessment    Patient Name: Jodi Bullock  DOB: 01-Sep-1924  MRN: [id number]   HEARTCARE STAFF: - Please ensure there is not already an duplicate clearance open for this procedure. - Under Visit Info/Reason for Call, type in Other and utilize the format Clearance MM/DD/YY or Clearance TBD. Do not use dashes or single digits. - If request is for dental extraction, please clarify the # of teeth to be extracted.  Request for surgical clearance:  1. What type of surgery is being performed? 1 tooth exraction  2. When is this surgery scheduled? 11/01/20  3. What type of clearance is required (medical clearance vs. Pharmacy clearance to hold med vs. Both)? pharm  4. Are there any medications that need to be held prior to surgery and how long? Advise on how long eliquis prior   5. Practice name and name of physician performing surgery? [facility]  6. What is the office phone number? [phone]   7.   What is the office fax number? [phone]  8.   Anesthesia type (None, local, MAC, general) ? local   Marykay Lex 09/21/2020, 3:28 PM  _________________________________________________________________   (provider comments below)

## 2020-10-07 ENCOUNTER — Ambulatory Visit (INDEPENDENT_AMBULATORY_CARE_PROVIDER_SITE_OTHER): Payer: Medicare Other | Admitting: Podiatry

## 2020-10-07 ENCOUNTER — Other Ambulatory Visit: Payer: Self-pay

## 2020-10-07 ENCOUNTER — Encounter: Payer: Self-pay | Admitting: Podiatry

## 2020-10-07 ENCOUNTER — Telehealth: Payer: Self-pay | Admitting: *Deleted

## 2020-10-07 DIAGNOSIS — L03031 Cellulitis of right toe: Secondary | ICD-10-CM

## 2020-10-07 DIAGNOSIS — L97511 Non-pressure chronic ulcer of other part of right foot limited to breakdown of skin: Secondary | ICD-10-CM | POA: Diagnosis not present

## 2020-10-07 MED ORDER — DOXYCYCLINE HYCLATE 100 MG PO TABS: 100.0000 mg | ORAL_TABLET | Freq: Two times a day (BID) | ORAL | 0 refills | Status: DC

## 2020-10-07 NOTE — Telephone Encounter (Signed)
"  Jodi Bullock was seen there today by Dr. Posey Pronto.  He prescribed her Doxycycline.  We need an order and clarification of how long she is to take the medicine.  He gave her a quantity of 40.  Can he stat it because we can't give it to her until we have the order.  My clinical email is [email or web address] and our fax number is [phone]."

## 2020-10-07 NOTE — Telephone Encounter (Signed)
I gave her 14 day course of antibiotics. She can take it BID until completion of antibiotics

## 2020-10-07 NOTE — Addendum Note (Signed)
Addended by: Boneta Lucks on: 10/07/2020 09:30 AM   Modules accepted: Orders

## 2020-10-07 NOTE — Progress Notes (Signed)
Subjective:  Patient ID: Jodi Bullock, female    DOB: 1925/03/30,  MRN: [id number]  Chief Complaint  Patient presents with  . Foot Ulcer    Right foot ulcer  Long thick painful nails     87 y.o. female presents for wound care.  Patient presents with new right foot ulcers for submetatarsal 1 and distal tip of the third digit.  Patient states that this started back up again.  She states that she has not been taking care of at the nursing facility properly.  She states that they have not been putting any kind dressings on it.  Is been very sore to touch.  She would like to discuss wound care options again.   Review of Systems: Negative except as noted in the HPI. Denies N/V/F/Ch.  Past Medical History:  Diagnosis Date  . Arthritis   . Atrial flutter ([place])    a. Dx 07/2018. CHA2DS2VASc = 5-->Eliquis.  . Bladder incontinence   . Breast cancer ([place]) 1998  . Chronic heart failure with improved ejection fraction (HFimpEF) 01/10/2016   a. 09/2015 Echo: EF 40-45%, possible inf HK, mod PAH (PASP 98mHg); b. 11/2018 Echo: EF 60-65%, RVSP 651mg. Mod dil LA, mildly dil RA. Mod MR.  . Closed fracture of right femur ([facility]   a. 08/2017->Managed conservatively.  . Marland KitchenOVID-19 virus infection    a. 05/2019 - asymptomatic.  . Marland Kitchenepression   . GERD (gastroesophageal reflux disease)   . History of kidney stones   . HLD (hyperlipidemia)   . Hypertension   . Neuropathy of both feet   . NICM (nonischemic cardiomyopathy) ([facility]   a. 08/2015 Lexiscan MV: EF 39%, no ischemia; b. 09/2015 Echo: EF 40-45%; c. 11/2018 Echo: EF 60-65%.  . Persistent atrial fibrillation ([facility]  . Right Humerus head fracture    a. 07/2016 - managed conservatively.  . 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  doxycycline (VIBRA-TABS) 100 MG tablet, Take 1 tablet (100 mg total) by mouth 2 (two) times daily., Disp: 40 tablet, Rfl: 0 .  acetaminophen (TYLENOL) 500 MG tablet, Take 500 mg by mouth every 4  (four) hours as needed for mild pain or fever., Disp: , Rfl:  .  acetaminophen (TYLENOL) 650 MG CR tablet, Take 650 mg by mouth 3 (three) times daily., Disp: , Rfl:  .  acidophilus (RISAQUAD) CAPS capsule, Take by mouth daily., Disp: , Rfl:  .  alum & mag hydroxide-simeth (MAALOX/MYLANTA) 200-200-20 MG/5ML suspension, Take 30 mLs by mouth at bedtime as needed for indigestion or heartburn., Disp: , Rfl:  .  carbamide peroxide (GLY-OXIDE) 10 % solution, Place 1 application onto teeth 4 (four) times daily -  with meals and at bedtime. (as needed for dry mouth), Disp: , Rfl:  .  carvedilol (COREG) 6.25 MG tablet, Take 1 tablet (6.25 mg total) by mouth 2 (two) times daily with a meal., Disp: , Rfl:  .  Cholecalciferol 2000 UNITS TABS, Take 2,000 Units by mouth daily. , Disp: , Rfl:  .  conjugated estrogens (PREMARIN) vaginal cream, Apply 0.50m12mpea-sized amount)  just inside the vaginal introitus with a finger-tip on  Monday, Wednesday and Friday nights., Disp: 30 g, Rfl: 12 .  Cyanocobalamin 1000 MCG/ML KIT, Inject 1,000 mcg as directed every 30 (thirty) days. , Disp: , Rfl:  .  diclofenac sodium (VOLTAREN) 1 % GEL, Apply 2 g topically 2 (two) times daily as needed (knee pain)., Disp: ,  Rfl:  .  docusate sodium (COLACE) 100 MG capsule, Take 100 mg by mouth at bedtime. , Disp: , Rfl:  .  ELIQUIS 5 MG TABS tablet, TAKE 1 TABLET(5 MG) BY MOUTH TWICE DAILY, Disp: 180 tablet, Rfl: 1 .  estrogens, conjugated, (PREMARIN) 0.625 MG tablet, Take by mouth., Disp: , Rfl:  .  famotidine (PEPCID) 20 MG tablet, Take 20 mg by mouth as needed for heartburn or indigestion., Disp: , Rfl:  .  fluticasone (FLONASE) 50 MCG/ACT nasal spray, Place 1 spray into both nostrils daily., Disp: 16 g, Rfl: 2 .  Fluticasone Furoate (ARNUITY ELLIPTA) 100 MCG/ACT AEPB, Inhale 1 puff into the lungs daily., Disp: 30 each, Rfl: 10 .  furosemide (LASIX) 40 MG tablet, Take 1 tablet (40 mg total) by mouth daily., Disp: , Rfl:  .  gabapentin  (NEURONTIN) 300 MG capsule, daily., Disp: , Rfl:  .  guaiFENesin (MUCINEX) 600 MG 12 hr tablet, Take 600 mg by mouth 2 (two) times daily as needed for cough or to loosen phlegm. , Disp: , Rfl:  .  guaiFENesin (ROBITUSSIN) 100 MG/5ML SOLN, Take 10 mLs by mouth every 6 (six) hours as needed for cough or to loosen phlegm., Disp: , Rfl:  .  lidocaine (LMX) 4 % cream, Apply 1 application topically at bedtime., Disp: , Rfl:  .  Lidocaine-Glycerin (PREPARATION H EX), Apply topically daily as needed., Disp: , Rfl:  .  loperamide (IMODIUM A-D) 2 MG tablet, Take 2 mg by mouth as needed for diarrhea or loose stools (max 4 doses daily). , Disp: , Rfl:  .  loratadine (CLARITIN) 10 MG tablet, Take 10 mg by mouth daily., Disp: , Rfl:  .  LORazepam (ATIVAN) 0.5 MG tablet, Take 1 tablet (0.5 mg total) by mouth 2 (two) times daily as needed for anxiety., Disp: 60 tablet, Rfl: 2 .  losartan (COZAAR) 100 MG tablet, Take 1 tablet (100 mg total) by mouth daily., Disp: 30 tablet, Rfl: 2 .  lovastatin (MEVACOR) 40 MG tablet, Take 1 tablet (40 mg total) by mouth daily., Disp: 90 tablet, Rfl: 0 .  Lutein 10 MG TABS, Take 10 mg by mouth 2 (two) times daily., Disp: , Rfl:  .  magnesium hydroxide (MILK OF MAGNESIA) 400 MG/5ML suspension, Take 30 mLs by mouth at bedtime as needed for mild constipation., Disp: , Rfl:  .  mirabegron ER (MYRBETRIQ) 25 MG TB24 tablet, Take 25 mg by mouth daily., Disp: , Rfl:  .  neomycin-bacitracin-polymyxin (NEOSPORIN) [phone] ointment, Apply topically 4 (four) times daily as needed (wound care)., Disp: , Rfl:  .  polyethylene glycol (MIRALAX / GLYCOLAX) 17 g packet, Take 17 g by mouth daily. , Disp: , Rfl:  .  Saccharomyces boulardii (PROBIOTIC) 250 MG CAPS, Take 1 capsule by mouth daily. , Disp: , Rfl:  .  Sodium Phosphates (ENEMA) 7-19 GM/118ML ENEM, Place rectally., Disp: , Rfl:  .  traMADol (ULTRAM) 50 MG tablet, Take 50 mg by mouth daily. , Disp: , Rfl:  .  triamcinolone (KENALOG) 0.025 %  ointment, Apply 1 application topically as needed., Disp: , Rfl:  .  triamcinolone lotion (KENALOG) 0.1 %, Apply topically., Disp: , Rfl:  .  trimethoprim (TRIMPEX) 100 MG tablet, Take 1 tablet (100 mg total) by mouth daily., Disp: 30 tablet, Rfl: 11  Social History   Tobacco Use  Smoking Status Former Smoker  Smokeless Tobacco Never Used  Tobacco Comment   smoked in high school for a few years only  Allergies  Allergen Reactions  . 2,4-D Dimethylamine (Amisol) Other (See Comments)    Other Reaction: OTHER REACTION-IRRITATION TO Esophagous  . Celebrex [Celecoxib] Other (See Comments)     esophagitis Esophageal spasms    . Ciprofloxacin Other (See Comments)    Esophageal irritation  . Ibuprofen Other (See Comments)    Other reaction(s): Other (See Comments) reflux  . Levamisole Other (See Comments) and Nausea And Vomiting  . Metronidazole Other (See Comments)    neuropathy  . Naproxen Other (See Comments)    GI UPSET  . Other Other (See Comments)    Other Reaction: esophagitis   Objective:  There were no vitals filed for this visit. There is no height or weight on file to calculate BMI. Constitutional Well developed. Well nourished.  Vascular Dorsalis pedis pulses palpable bilaterally. Posterior tibial pulses palpable bilaterally. Capillary refill normal to all digits.  No cyanosis or clubbing noted. Pedal hair growth normal.  Neurologic Normal speech. Oriented to person, place, and time. Protective sensation absent  Dermatologic Wound Location: Right submetatarsal 1 and right third digit distal tip ulceration.  Limited to the breakdown of the skin.  Cellulitis noted to the third digit up to the IPJ.  No purulent drainage noted.  No malodor present. Wound Base: Mixed Granular/Fibrotic Peri-wound: Reddened Exudate: Scant/small amount Serosanguinous exudate Wound Measurements: -See below  Orthopedic: No pain to palpation either foot.   Radiographs:  None Assessment:   1. Ulcer of right foot, limited to breakdown of skin ([place])   2. Cellulitis of toe of right foot    Plan:  Patient was evaluated and treated and all questions answered.  Ulcer right submetatarsal 1 and right third digit distal tip ulceration -Debridement as below. -Dressed with Betadine wet-to-dry, DSD. -Continue off-loading with surgical shoe.  Procedure: Excisional Debridement of Wound right submetatarsal 1 Tool: Sharp chisel blade/tissue nipper Rationale: Removal of non-viable soft tissue from the wound to promote healing.  Anesthesia: none Pre-Debridement Wound Measurements: 0.5 cm x 0.5 cm x 0.2 cm  Post-Debridement Wound Measurements: 0.6 cm x 0.5 cm x 0.2 cm  Type of Debridement: Sharp Excisional Tissue Removed: Non-viable soft tissue Blood loss: Minimal (<50cc) Depth of Debridement: subcutaneous tissue. Technique: Sharp excisional debridement to bleeding, viable wound base.  Wound Progress: This is my initial evaluation.  I will continue monitor the progression of it. Site healing conversation 7 Dressing: Dry, sterile, compression dressing. Disposition: Patient tolerated procedure well. Patient to return in 1 week for follow-up.  Procedure: Excisional Debridement of Wound right s distal tip of the third digit Tool: Sharp chisel blade/tissue nipper Rationale: Removal of non-viable soft tissue from the wound to promote healing.  Anesthesia: none Pre-Debridement Wound Measurements: 0.4 cm x 0.5 cm x 0.2 cm  Post-Debridement Wound Measurements: 0.6 cm x 0.5 cm x 0.2 cm  Type of Debridement: Sharp Excisional Tissue Removed: Non-viable soft tissue Blood loss: Minimal (<50cc) Depth of Debridement: subcutaneous tissue. Technique: Sharp excisional debridement to bleeding, viable wound base.  Wound Progress: This is my initial evaluation I will continue to monitor the progression of Site healing conversation 7 Dressing: Dry, sterile, compression  dressing. Disposition: Patient tolerated procedure well. Patient to return in 1 week for follow-up.  No follow-ups on file.

## 2020-10-19 ENCOUNTER — Ambulatory Visit: Payer: Medicare Other | Admitting: Podiatry

## 2020-10-20 ENCOUNTER — Other Ambulatory Visit: Payer: Self-pay

## 2020-10-20 ENCOUNTER — Emergency Department
Admission: EM | Admit: 2020-10-20 | Discharge: 2020-10-20 | Disposition: A | Discharge status: Home / Self Care | Payer: Medicare Other | Attending: Emergency Medicine | Admitting: Emergency Medicine

## 2020-10-20 ENCOUNTER — Emergency Department: Payer: Medicare Other

## 2020-10-20 DIAGNOSIS — Z87891 Personal history of nicotine dependence: Secondary | ICD-10-CM | POA: Diagnosis not present

## 2020-10-20 DIAGNOSIS — I5022 Chronic systolic (congestive) heart failure: Secondary | ICD-10-CM | POA: Insufficient documentation

## 2020-10-20 DIAGNOSIS — R1030 Lower abdominal pain, unspecified: Secondary | ICD-10-CM | POA: Diagnosis present

## 2020-10-20 DIAGNOSIS — I2510 Atherosclerotic heart disease of native coronary artery without angina pectoris: Secondary | ICD-10-CM | POA: Diagnosis not present

## 2020-10-20 DIAGNOSIS — Z79899 Other long term (current) drug therapy: Secondary | ICD-10-CM | POA: Diagnosis not present

## 2020-10-20 DIAGNOSIS — Z853 Personal history of malignant neoplasm of breast: Secondary | ICD-10-CM | POA: Diagnosis not present

## 2020-10-20 DIAGNOSIS — Z85238 Personal history of other malignant neoplasm of thymus: Secondary | ICD-10-CM | POA: Diagnosis not present

## 2020-10-20 DIAGNOSIS — I110 Hypertensive heart disease with heart failure: Secondary | ICD-10-CM | POA: Diagnosis not present

## 2020-10-20 DIAGNOSIS — Z7901 Long term (current) use of anticoagulants: Secondary | ICD-10-CM | POA: Insufficient documentation

## 2020-10-20 DIAGNOSIS — K219 Gastro-esophageal reflux disease without esophagitis: Secondary | ICD-10-CM | POA: Insufficient documentation

## 2020-10-20 DIAGNOSIS — I4819 Other persistent atrial fibrillation: Secondary | ICD-10-CM | POA: Diagnosis not present

## 2020-10-20 DIAGNOSIS — Z96641 Presence of right artificial hip joint: Secondary | ICD-10-CM | POA: Insufficient documentation

## 2020-10-20 DIAGNOSIS — Z8616 Personal history of COVID-19: Secondary | ICD-10-CM | POA: Insufficient documentation

## 2020-10-20 LAB — CBC
HCT: 43 % (ref 36.0–46.0)
Hemoglobin: 14.1 g/dL (ref 12.0–15.0)
MCH: 31.3 pg (ref 26.0–34.0)
MCHC: 32.8 g/dL (ref 30.0–36.0)
MCV: 95.3 fL (ref 80.0–100.0)
Platelets: 179 10*3/uL (ref 150–400)
RBC: 4.51 MIL/uL (ref 3.87–5.11)
RDW: 13.1 % (ref 11.5–15.5)
WBC: 8.1 10*3/uL (ref 4.0–10.5)
nRBC: 0 % (ref 0.0–0.2)

## 2020-10-20 LAB — URINALYSIS, COMPLETE (UACMP) WITH MICROSCOPIC
Bacteria, UA: NONE SEEN
Bilirubin Urine: NEGATIVE
Glucose, UA: NEGATIVE mg/dL
Hgb urine dipstick: NEGATIVE
Ketones, ur: NEGATIVE mg/dL
Leukocytes,Ua: NEGATIVE
Nitrite: NEGATIVE
Protein, ur: NEGATIVE mg/dL
Specific Gravity, Urine: 1.005 (ref 1.005–1.030)
pH: 8 (ref 5.0–8.0)

## 2020-10-20 LAB — COMPREHENSIVE METABOLIC PANEL
ALT: 15 U/L (ref 0–44)
AST: 22 U/L (ref 15–41)
Albumin: 3.9 g/dL (ref 3.5–5.0)
Alkaline Phosphatase: 62 U/L (ref 38–126)
Anion gap: 7 (ref 5–15)
BUN: 18 mg/dL (ref 8–23)
CO2: 27 mmol/L (ref 22–32)
Calcium: 9.4 mg/dL (ref 8.9–10.3)
Chloride: 106 mmol/L (ref 98–111)
Creatinine, Ser: 0.86 mg/dL (ref 0.44–1.00)
GFR, Estimated: >60 mL/min (ref >60)
Glucose, Bld: 115 mg/dL — ABNORMAL HIGH (ref 70–99)
Potassium: 4.2 mmol/L (ref 3.5–5.1)
Sodium: 140 mmol/L (ref 135–145)
Total Bilirubin: 1.4 mg/dL — ABNORMAL HIGH (ref 0.3–1.2)
Total Protein: 7.1 g/dL (ref 6.5–8.1)

## 2020-10-20 LAB — LIPASE, BLOOD: Lipase: 24 U/L (ref 11–51)

## 2020-10-20 NOTE — ED Notes (Signed)
Patient transported to CT 

## 2020-10-20 NOTE — ED Triage Notes (Signed)
Pt BIBA from [facility] for abd pain. Pt states its been hurting "for a while". Pt denies N/V/D. Pt refused to take medications today per facility.

## 2020-10-20 NOTE — Discharge Instructions (Signed)
Patient had reassuring lab work and a normal CT scan.  Her urine was normal

## 2020-10-20 NOTE — ED Provider Notes (Signed)
[facility] Emergency Department Provider Note   ____________________________________________    I have reviewed the triage vital signs and the nursing notes.   HISTORY  Chief Complaint Abdominal Pain     HPI Jodi Bullock is a 87 y.o. female with extensive past medical history as detailed above who apparently was complaining of abdominal discomfort today.  She reports it is feeling better.  She denies dysuria.  No fevers chills nausea or vomiting.  Describes a pulling sensation in her lower abdomen.  Has not take anything for this.   Past Medical History:  Diagnosis Date  . Arthritis   . Atrial flutter ([place])    a. Dx 07/2018. CHA2DS2VASc = 5-->Eliquis.  . Bladder incontinence   . Breast cancer ([place]) 1998  . Chronic heart failure with improved ejection fraction (HFimpEF) 01/10/2016   a. 09/2015 Echo: EF 40-45%, possible inf HK, mod PAH (PASP 33mHg); b. 11/2018 Echo: EF 60-65%, RVSP 656mg. Mod dil LA, mildly dil RA. Mod MR.  . Closed fracture of right femur ([facility]   a. 08/2017->Managed conservatively.  . Marland KitchenOVID-19 virus infection    a. 05/2019 - asymptomatic.  . Marland Kitchenepression   . GERD (gastroesophageal reflux disease)   . History of kidney stones   . HLD (hyperlipidemia)   . Hypertension   . Neuropathy of both feet   . NICM (nonischemic cardiomyopathy) ([facility]CDw[place]   a. 08/2015 Lexiscan MV: EF 39%, no ischemia; b. 09/2015 Echo: EF 40-45%; c. 11/2018 Echo: EF 60-65%.  . Persistent atrial fibrillation (HCFraz[place]  . Right Humerus head fracture    a. 07/2016 - managed conservatively.  . 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    Patient Active Problem List   Diagnosis Date Noted  . 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 Atrophy of vagina 05/17/2020  . Chronic pain syndrome 05/17/2020  . Closed fracture pubis (HCWanda01/07/2020  . Constipation by delayed colonic transit 05/17/2020  . Contusion of unspecified part of head,  initial encounter 05/17/2020  . COVID-19 05/17/2020  . Dysuria 05/17/2020  . Increased frequency of urination 05/17/2020  . Acute upper respiratory infection 05/17/2020  . Laceration without foreign body of left elbow, initial encounter 05/17/2020  . Macrocytosis 05/17/2020  . Major depression, single episode 05/17/2020  . Mixed simple and mucopurulent chronic bronchitis (HCCatalina01/07/2020  . Ulcer of right foot (HCRand01/07/2020  . Overactive bladder 05/17/2020  . Pain in limb 05/17/2020  . Atrial flutter (HC[facility] Creek01/07/2020  . Peripheral edema 05/17/2020  . Pernicious anemia 05/17/2020  . Restless legs syndrome 05/17/2020  . Sciatica 05/17/2020  . Tinea unguium 05/17/2020  . Unspecified fracture of sacrum, initial encounter for closed fracture (HCBeasley01/07/2020  . Vaginal candidiasis 05/17/2020  . Vitamin B12 deficiency anemia, unspecified 05/17/2020  . Weight loss 05/17/2020  . Microscopic hematuria 05/21/2019  . Femur fracture (HCNondalton04/28/2019  . Abnormal gait 12/19/2016  . Acquired spondylolisthesis 12/19/2016  . Breast lump 12/19/2016  . Cervical spondylosis without myelopathy 12/19/2016  . Closed Colles' fracture 12/19/2016  . Closed fracture of base of neck of femur (HCBoaz08/11/2016  . Closed fracture of lower end of radius and ulna 12/19/2016  . Closed fracture of neck of femur (HCMiami08/11/2016  . Spondylolisthesis, congenital 12/19/2016  . Contracture of joint of hand 12/19/2016  . Contracture of wrist joint 12/19/2016  . Contusion of hand 12/19/2016  . Contusion of hip 12/19/2016  . Contusion of knee  12/19/2016  . Degeneration of intervertebral disc at C4-C5 level 12/19/2016  . Degeneration of lumbar intervertebral disc 12/19/2016  . Disorder of coccyx 12/19/2016  . Dry eyes 12/19/2016  . Enthesopathy 12/19/2016  . Enthesopathy of hip region 12/19/2016  . Hand joint pain 12/19/2016  . Hand joint stiff 12/19/2016  . Hip pain 12/19/2016  . Joint pain 12/19/2016  .  Knee pain 12/19/2016  . Localized, primary osteoarthritis 12/19/2016  . Loose body in hip joint 12/19/2016  . Lumbar sprain 12/19/2016  . Abnormal mammogram 12/19/2016  . Meibomian gland dysfunction 12/19/2016  . Muscle weakness 12/19/2016  . Open fracture of lower end of forearm 12/19/2016  . Presbyopia 12/19/2016  . Regular astigmatism 12/19/2016  . Shoulder joint pain 12/19/2016  . Skin sensation disturbance 12/19/2016  . Sprain of ankle 12/19/2016  . Trochanteric bursitis 12/19/2016  . Wrist joint pain 12/19/2016  . Stiffness of wrist joint 12/19/2016  . Acute bilateral low back pain without sciatica 09/29/2016  . Acute bronchitis 06/19/2016  . Recurrent productive cough 06/14/2016  . Medicare annual wellness visit, subsequent 05/17/2016  . Fall in elderly patient 01/27/2016  . Sprain of wrist, right 01/26/2016  . Systolic CHF with reduced left ventricular function, NYHA class 3 ([place]) 01/10/2016  . Productive cough 11/19/2015  . Chronic systolic heart failure ([place]) 10/12/2015  . Dyspnea on exertion 08/31/2015  . Fatigue 08/31/2015  . 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 Irregular heart rhythm 07/22/2015  . Left arm pain 06/29/2015  . Dyslipidemia 06/03/2015  . Acute recurrent maxillary sinusitis 06/03/2015  . Oral mucosal lesion 05/26/2015  . Urge incontinence 02/14/2015  . Ankle edema 02/02/2015  . At risk for falling 01/25/2015  . Dizziness 01/25/2015  . Acid reflux 01/25/2015  . Big thyroid 01/25/2015  . Gravida 2 para 2 01/25/2015  . Personal history of malignant neoplasm of breast 01/25/2015  . Arthritis, degenerative 01/25/2015  . Parity 2 01/25/2015  . Peripheral blood vessel disorder ([place]) 01/25/2015  . Need for vaccination 01/25/2015  . Pain in rectum 01/25/2015  . Reflux 01/25/2015  . Screening for depression 01/25/2015  . Disease of accessory sinus 01/25/2015  . Headache, temporal 01/25/2015  . Primary cancer of thymus ([place]) 01/25/2015  . Disease of  thyroid gland 01/25/2015  . Avitaminosis D 01/25/2015  . Family history of diabetes mellitus in father 12/31/2014  . Fasting hyperglycemia 12/31/2014  . Hypertension 12/01/2014  . HLD (hyperlipidemia) 12/01/2014  . Anxiety 12/01/2014  . Myofascial pain 12/01/2014  . Breast CA ([place]) 11/24/2014  . Absence of bladder continence 11/24/2014  . Urgency of micturation 11/24/2014  . Common bile duct dilatation 10/20/2014  . Pancreatic cyst 10/20/2014  . Kidney stones 10/20/2014  . Neuritis or radiculitis due to rupture of lumbar intervertebral disc 06/15/2014  . Lumbar canal stenosis 06/15/2014  . Degenerative arthritis of lumbar spine 06/15/2014  . Lumbar radiculitis 06/15/2014  . Osteoarthritis of spine with radiculopathy, lumbar region 06/15/2014  . 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 Bloodgood disease 11/05/2012  . Arthritis of temporomandibular joint 06/24/2012  . Atypical chest pain 01/05/2012  . Breath shortness 12/04/2011  . Lung mass 11/15/2011  . Thyroid nodule 11/15/2011  . Age-related macular degeneration, dry 10/03/2011  . Disorder of peripheral nervous system 10/03/2011  . Chronic rhinitis 03/03/2011  . Carotid artery narrowing 03/01/2011  . Polypharmacy 12/02/2010    Past Surgical History:  Procedure Laterality Date  . ABDOMINAL HYSTERECTOMY    . BREAST LUMPECTOMY    .  CATARACT EXTRACTION    . CHOLECYSTECTOMY    . JOINT REPLACEMENT Right    Hip  . KNEE SURGERY Right     Prior to Admission medications   Medication Sig Start Date End Date Taking? Authorizing Provider  acetaminophen (TYLENOL) 500 MG tablet Take 500 mg by mouth every 4 (four) hours as needed for mild pain or fever.   Yes [provider]  carvedilol (COREG) 6.25 MG tablet Take 1 tablet (6.25 mg total) by mouth 2 (two) times daily with a meal. 07/08/20  Yes Wellington Hampshire, MD  Cholecalciferol 2000 UNITS TABS Take 2,000 Units by mouth daily.    Yes Keith Rake Asad A, MD   Cyanocobalamin 1000 MCG/ML KIT Inject 1,000 mcg as directed every 30 (thirty) days.    Yes [provider]  diclofenac sodium (VOLTAREN) 1 % GEL Apply 2 g topically 2 (two) times daily as needed (knee pain).   Yes [provider]  docusate sodium (COLACE) 100 MG capsule Take 100 mg by mouth at bedtime.    Yes [provider]  doxycycline (VIBRA-TABS) 100 MG tablet Take 1 tablet (100 mg total) by mouth 2 (two) times daily. 10/07/20  Yes Patel, Kevin P, DPM  ELIQUIS 5 MG TABS tablet TAKE 1 TABLET(5 MG) BY MOUTH TWICE DAILY Patient taking differently: Take 5 mg by mouth 2 (two) times daily. 01/26/20  Yes Wellington Hampshire, MD  fluticasone (FLONASE) 50 MCG/ACT nasal spray Place 1 spray into both nostrils daily. 08/30/17  Yes Wilhelmina Mcardle, MD  Fluticasone Furoate (ARNUITY ELLIPTA) 100 MCG/ACT AEPB Inhale 1 puff into the lungs daily. 08/30/17  Yes Wilhelmina Mcardle, MD  furosemide (LASIX) 40 MG tablet Take 1 tablet (40 mg total) by mouth daily. 07/08/20  Yes Wellington Hampshire, MD  gabapentin (NEURONTIN) 300 MG capsule Take 300 mg by mouth at bedtime.   Yes [provider]  loratadine (CLARITIN) 10 MG tablet Take 10 mg by mouth daily.   Yes [provider]  LORazepam (ATIVAN) 0.5 MG tablet Take 1 tablet (0.5 mg total) by mouth 2 (two) times daily as needed for anxiety. 05/23/17  Yes Roselee Nova, MD  losartan (COZAAR) 100 MG tablet Take 1 tablet (100 mg total) by mouth daily. 05/23/17  Yes Roselee Nova, MD  mirabegron ER (MYRBETRIQ) 25 MG TB24 tablet Take 25 mg by mouth daily.   Yes [provider]  traMADol (ULTRAM) 50 MG tablet Take 50 mg by mouth daily.    Yes [provider]  trimethoprim (TRIMPEX) 100 MG tablet Take 1 tablet (100 mg total) by mouth daily. 03/04/18  Yes MacDiarmid, Nicki Reaper, MD  acetaminophen (TYLENOL) 650 MG CR tablet Take 650 mg by mouth 3 (three) times daily.    [provider]  acidophilus (RISAQUAD) CAPS  capsule Take by mouth daily. Patient not taking: No sig reported    [provider]  alum & mag hydroxide-simeth (MAALOX/MYLANTA) 200-200-20 MG/5ML suspension Take 30 mLs by mouth at bedtime as needed for indigestion or heartburn.    [provider]  carbamide peroxide (GLY-OXIDE) 10 % solution Place 1 application onto teeth 4 (four) times daily -  with meals and at bedtime. (as needed for dry mouth)    [provider]  conjugated estrogens (PREMARIN) vaginal cream Apply 0.84m (pea-sized amount)  just inside the vaginal introitus with a finger-tip on  Monday, Wednesday and Friday nights. 04/15/19   MZara CouncilA, PA-C  famotidine (  PEPCID) 20 MG tablet Take 20 mg by mouth as needed for heartburn or indigestion. Patient not taking: No sig reported    [provider]  Lidocaine-Glycerin (PREPARATION H EX) Apply topically daily as needed.    [provider]  loperamide (IMODIUM A-D) 2 MG tablet Take 2 mg by mouth as needed for diarrhea or loose stools (max 4 doses daily).     [provider]  lovastatin (MEVACOR) 40 MG tablet Take 1 tablet (40 mg total) by mouth daily. 05/23/17   Roselee Nova, MD  Lutein 10 MG TABS Take 10 mg by mouth 2 (two) times daily. Patient not taking: Reported on 10/20/2020    [provider]  magnesium hydroxide (MILK OF MAGNESIA) 400 MG/5ML suspension Take 30 mLs by mouth at bedtime as needed for mild constipation.    [provider]  neomycin-bacitracin-polymyxin (NEOSPORIN) [phone] ointment Apply topically 4 (four) times daily as needed (wound care).    [provider]  polyethylene glycol (MIRALAX / GLYCOLAX) 17 g packet Take 17 g by mouth daily.     [provider]  Saccharomyces boulardii (PROBIOTIC) 250 MG CAPS Take 1 capsule by mouth daily.  Patient not taking: Reported on 10/20/2020    [provider]  Sodium Phosphates (ENEMA) 7-19 GM/118ML ENEM Place rectally.     [provider]     Allergies 2,4-d dimethylamine (amisol); Celebrex [celecoxib]; Ciprofloxacin; Ibuprofen; Levamisole; Metronidazole; Naproxen; and Other  Family History  Problem Relation Age of Onset  . Stroke Mother   . Diabetes Father   . Heart disease Father   . Kidney disease Neg Hx   . Bladder Cancer Neg Hx   . Kidney cancer Neg Hx     Social History Social History   Tobacco Use  . Smoking status: Former Research scientist (life sciences)  . Smokeless tobacco: Never Used  . Tobacco comment: smoked in high school for a few years only  Vaping Use  . Vaping Use: Never used  Substance Use Topics  . Alcohol use: No    Alcohol/week: 0.0 standard drinks  . Drug use: No    Review of Systems  Constitutional: No fever/chills Eyes: No visual changes.  ENT: No sore throat. Cardiovascular: Denies chest pain. Respiratory: Denies shortness of breath. Gastrointestinal: As above.   Genitourinary: Negative for dysuria. Musculoskeletal: Negative for back pain. Skin: Negative for rash. Neurological: Negative for headaches or weakness   ____________________________________________   PHYSICAL EXAM:  VITAL SIGNS: ED Triage Vitals  Enc Vitals Group     BP 10/20/20 1145 (!) 214/90     Pulse Rate 10/20/20 1139 78     Resp 10/20/20 1139 (!) 22     Temp 10/20/20 1139 98.3 F (36.8 C)     Temp Source 10/20/20 1139 Oral     SpO2 10/20/20 1139 96 %     Weight 10/20/20 1136 64.2 kg (141 lb 8 oz)     Height 10/20/20 1136 1.676 m (_0 )     Head Circumference --      Peak Flow --      Pain Score --      Pain Loc --      Pain Edu? --      Excl. in [place]? --     Constitutional: Alert and oriented.   Nose: No congestion/rhinnorhea. Mouth/Throat: Mucous membranes are moist.   Neck:  Painless ROM Cardiovascular: Normal rate, regular rhythm. Grossly normal heart sounds.  Good peripheral circulation. Respiratory: Normal respiratory  effort.  No retractions. Lungs CTAB. Gastrointestinal: Soft and  nontender. No distention.  No CVA tenderness.  Reassuring exam  Musculoskeletal: Warm and well perfused Neurologic:  Normal speech and language. No gross focal neurologic deficits are appreciated.  Skin:  Skin is warm, dry and intact. No rash noted. Psychiatric: Mood and affect are normal. Speech and behavior are normal.  ____________________________________________   LABS (all labs ordered are listed, but only abnormal results are displayed)  Labs Reviewed  COMPREHENSIVE METABOLIC PANEL - Abnormal; Notable for the following components:      Result Value   Glucose, Bld 115 (*)    Total Bilirubin 1.4 (*)    All other components within normal limits  URINALYSIS, COMPLETE (UACMP) WITH MICROSCOPIC - Abnormal; Notable for the following components:   Color, Urine STRAW (*)    APPearance CLEAR (*)    All other components within normal limits  CBC  LIPASE, BLOOD   ____________________________________________  EKG  ED ECG REPORT I, Lavonia Drafts, the attending physician, personally viewed and interpreted this ECG.  Date: 10/20/2020  Rhythm: Atrial fibrillation QRS Axis: normal Intervals: normal ST/T Wave abnormalities: normal Narrative Interpretation: no evidence of acute ischemia  ____________________________________________  RADIOLOGY  CT abdomen pelvis reviewed by me ____________________________________________   PROCEDURES  Procedure(s) performed: No  Procedures   Critical Care performed: No ____________________________________________   INITIAL IMPRESSION / ASSESSMENT AND PLAN / ED COURSE  Pertinent labs & imaging results that were available during my care of the patient were reviewed by me and considered in my medical decision making (see chart for details).  Patient overall well-appearing in no acute distress, abdominal exam is reassuring.  Lab work thus far is unremarkable.  Urinalysis is normal.  Pending CT abdomen pelvis  CT scan without acute  abnormality, patient appears improved she is asymptomatic at this time, appropriate for discharge with outpatient follow-up    ____________________________________________   FINAL CLINICAL IMPRESSION(S) / ED DIAGNOSES  Final diagnoses:  Lower abdominal pain        Note:  This document was prepared using Dragon voice recognition software and may include unintentional dictation errors.   Lavonia Drafts, MD 10/20/20 1455

## 2020-10-21 ENCOUNTER — Ambulatory Visit: Payer: Medicare Other | Admitting: Podiatry

## 2020-10-21 ENCOUNTER — Ambulatory Visit (INDEPENDENT_AMBULATORY_CARE_PROVIDER_SITE_OTHER): Payer: Medicare Other | Admitting: Podiatry

## 2020-10-21 DIAGNOSIS — L97511 Non-pressure chronic ulcer of other part of right foot limited to breakdown of skin: Secondary | ICD-10-CM

## 2020-10-25 ENCOUNTER — Other Ambulatory Visit: Payer: Self-pay

## 2020-10-25 ENCOUNTER — Emergency Department: Payer: Medicare Other

## 2020-10-25 ENCOUNTER — Encounter: Payer: Self-pay | Admitting: Emergency Medicine

## 2020-10-25 ENCOUNTER — Emergency Department
Admission: EM | Admit: 2020-10-25 | Discharge: 2020-10-25 | Disposition: A | Discharge status: Home / Self Care | Payer: Medicare Other | Attending: Emergency Medicine | Admitting: Emergency Medicine

## 2020-10-25 DIAGNOSIS — I110 Hypertensive heart disease with heart failure: Secondary | ICD-10-CM | POA: Insufficient documentation

## 2020-10-25 DIAGNOSIS — Z853 Personal history of malignant neoplasm of breast: Secondary | ICD-10-CM | POA: Diagnosis not present

## 2020-10-25 DIAGNOSIS — Z87891 Personal history of nicotine dependence: Secondary | ICD-10-CM | POA: Diagnosis not present

## 2020-10-25 DIAGNOSIS — Z7901 Long term (current) use of anticoagulants: Secondary | ICD-10-CM | POA: Insufficient documentation

## 2020-10-25 DIAGNOSIS — Z85238 Personal history of other malignant neoplasm of thymus: Secondary | ICD-10-CM | POA: Insufficient documentation

## 2020-10-25 DIAGNOSIS — Z7951 Long term (current) use of inhaled steroids: Secondary | ICD-10-CM | POA: Diagnosis not present

## 2020-10-25 DIAGNOSIS — Z79899 Other long term (current) drug therapy: Secondary | ICD-10-CM | POA: Insufficient documentation

## 2020-10-25 DIAGNOSIS — I5033 Acute on chronic diastolic (congestive) heart failure: Secondary | ICD-10-CM | POA: Insufficient documentation

## 2020-10-25 DIAGNOSIS — Z96641 Presence of right artificial hip joint: Secondary | ICD-10-CM | POA: Insufficient documentation

## 2020-10-25 DIAGNOSIS — W19XXXA Unspecified fall, initial encounter: Secondary | ICD-10-CM

## 2020-10-25 DIAGNOSIS — S0990XA Unspecified injury of head, initial encounter: Secondary | ICD-10-CM | POA: Insufficient documentation

## 2020-10-25 DIAGNOSIS — E1140 Type 2 diabetes mellitus with diabetic neuropathy, unspecified: Secondary | ICD-10-CM | POA: Diagnosis not present

## 2020-10-25 DIAGNOSIS — Z8616 Personal history of COVID-19: Secondary | ICD-10-CM | POA: Diagnosis not present

## 2020-10-25 NOTE — ED Triage Notes (Signed)
Arrives via [facility] from [facility].  S/P mechanical fall.  Hit head. No LOC.  Takes Eliquis.  AAOx3.  Skin warm and dry. NAD

## 2020-10-25 NOTE — ED Notes (Signed)
Called [facility] for transport to [facility] [employer]

## 2020-10-25 NOTE — ED Notes (Signed)
Patient arrives from [facility] s/p fall at [facility] house and hit the back of her head on hardwood floor. Small knot on back of head left side.  S/o soreness denies loss of conciousness

## 2020-10-25 NOTE — ED Provider Notes (Signed)
[facility] Emergency Department Provider Note   ____________________________________________   Event Date/Time   First MD Initiated Contact with Patient 10/25/20 1335     (approximate)  I have reviewed the triage vital signs and the nursing notes.   HISTORY  Chief Complaint Fall    HPI Jodi Bullock is a 87 y.o. female with past medical history of hypertension, hyperlipidemia, CHF, atrial fibrillation on Eliquis, and thymus cancer who presents to the ED complaining of fall.  Patient reports that she was walking using her walker this morning when she got tripped up on the postop shoe in place to her right foot.  She reports falling backwards and hitting the back of her head on the radiator, but denies losing consciousness.  She denies any specific areas of pain but staff at [facility] wanted her checked out due to her being on blood thinners.  Patient specifically denies any pain in her chest, abdomen, or extremities.        Past Medical History:  Diagnosis Date   Arthritis    Atrial flutter ([place])    a. Dx 07/2018. CHA2DS2VASc = 5-->Eliquis.   Bladder incontinence    Breast cancer ([place]) 1998   Chronic heart failure with improved ejection fraction (HFimpEF) 01/10/2016   a. 09/2015 Echo: EF 40-45%, possible inf HK, mod PAH (PASP 63mHg); b. 11/2018 Echo: EF 60-65%, RVSP 689mg. Mod dil LA, mildly dil RA. Mod MR.   Closed fracture of right femur ([facility]   a. 08/2017->Managed conservatively.   COVID-19 virus infection    a. 05/2019 - asymptomatic.   Depression    GERD (gastroesophageal reflux disease)    History of kidney stones    HLD (hyperlipidemia)    Hypertension    Neuropathy of both feet    NICM (nonischemic cardiomyopathy) ([place]   a. 08/2015 Lexiscan MV: EF 39%, no ischemia; b. 09/2015 Echo: EF 40-45%; c. 11/2018 Echo: EF 60-65%.   Persistent atrial fibrillation (HCC)    Right Humerus head fracture    a. 07/2016 - managed conservatively.    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 05/17/2020   Atrophy of vagina 05/17/2020   Chronic pain syndrome 05/17/2020   Closed fracture pubis (H[facility]01/07/2020   Constipation by delayed colonic transit 05/17/2020   Contusion of unspecified part of head, initial encounter 05/17/2020   COVID-19 05/17/2020   Dysuria 05/17/2020   Increased frequency of urination 05/17/2020   Acute upper respiratory infection 05/17/2020   Laceration without foreign body of left elbow, initial encounter 05/17/2020   Macrocytosis 05/17/2020   Major depression, single episode 05/17/2020   Mixed simple and mucopurulent chronic bronchitis (HCPolk01/07/2020   Ulcer of right foot (HCTaos01/07/2020   Overactive bladder 05/17/2020   Pain in limb 05/17/2020   Atrial flutter ([facility]01/07/2020   Peripheral edema 05/17/2020   Pernicious anemia 05/17/2020   Restless legs syndrome 05/17/2020   Sciatica 05/17/2020   Tinea unguium 05/17/2020   Unspecified fracture of sacrum, initial encounter for closed fracture ([facility]01/07/2020   Vaginal candidiasis 05/17/2020   Vitamin B12 deficiency anemia, unspecified 05/17/2020   Weight loss 05/17/2020   Microscopic hematuria 05/21/2019   Femur fracture (HCWest[place]04/28/2019   Abnormal gait 12/19/2016   Acquired spondylolisthesis 12/19/2016   Breast lump 12/19/2016   Cervical spondylosis without myelopathy 12/19/2016   Closed  Colles' fracture 12/19/2016   Closed fracture of base of neck of femur ([place]) 12/19/2016   Closed fracture of lower end of radius and ulna 12/19/2016   Closed fracture of neck of femur ([place]) 12/19/2016   Spondylolisthesis, congenital 12/19/2016   Contracture of joint of hand 12/19/2016   Contracture of wrist joint 12/19/2016   Contusion of hand 12/19/2016   Contusion of hip 12/19/2016   Contusion of knee 12/19/2016   Degeneration of  intervertebral disc at C4-C5 level 12/19/2016   Degeneration of lumbar intervertebral disc 12/19/2016   Disorder of coccyx 12/19/2016   Dry eyes 12/19/2016   Enthesopathy 12/19/2016   Enthesopathy of hip region 12/19/2016   Hand joint pain 12/19/2016   Hand joint stiff 12/19/2016   Hip pain 12/19/2016   Joint pain 12/19/2016   Knee pain 12/19/2016   Localized, primary osteoarthritis 12/19/2016   Loose body in hip joint 12/19/2016   Lumbar sprain 12/19/2016   Abnormal mammogram 12/19/2016   Meibomian gland dysfunction 12/19/2016   Muscle weakness 12/19/2016   Open fracture of lower end of forearm 12/19/2016   Presbyopia 12/19/2016   Regular astigmatism 12/19/2016   Shoulder joint pain 12/19/2016   Skin sensation disturbance 12/19/2016   Sprain of ankle 12/19/2016   Trochanteric bursitis 12/19/2016   Wrist joint pain 12/19/2016   Stiffness of wrist joint 12/19/2016   Acute bilateral low back pain without sciatica 09/29/2016   Acute bronchitis 06/19/2016   Recurrent productive cough 06/14/2016   Medicare annual wellness visit, subsequent 05/17/2016   Fall in elderly patient 01/27/2016   Sprain of wrist, right 75/30/0511   Systolic CHF with reduced left ventricular function, NYHA class 3 ([place]) 01/10/2016   Productive cough 06/25/1733   Chronic systolic heart failure ([place]) 10/12/2015   Dyspnea on exertion 08/31/2015   Fatigue 08/31/2015   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[place]) 08/04/2015   Irregular heart rhythm 07/22/2015   Left arm pain 06/29/2015   Dyslipidemia 06/03/2015   Acute recurrent maxillary sinusitis 06/03/2015   Oral mucosal lesion 05/26/2015   Urge incontinence 02/14/2015   Ankle edema 02/02/2015   At risk for falling 01/25/2015   Dizziness 01/25/2015   Acid reflux 01/25/2015   Big thyroid 01/25/2015   Gravida 2 para 2 01/25/2015   Personal history of malignant neoplasm of breast 01/25/2015   Arthritis, degenerative 01/25/2015   Parity 2 01/25/2015   Peripheral blood  vessel disorder ([place]) 01/25/2015   Need for vaccination 01/25/2015   Pain in rectum 01/25/2015   Reflux 01/25/2015   Screening for depression 01/25/2015   Disease of accessory sinus 01/25/2015   Headache, temporal 01/25/2015   Primary cancer of thymus ([place]) 01/25/2015   Disease of thyroid gland 01/25/2015   Avitaminosis D 01/25/2015   Family history of diabetes mellitus in father 12/31/2014   Fasting hyperglycemia 12/31/2014   Hypertension 12/01/2014   HLD (hyperlipidemia) 12/01/2014   Anxiety 12/01/2014   Myofascial pain 12/01/2014   Breast CA ([place]) 11/24/2014   Absence of bladder continence 11/24/2014   Urgency of micturation 11/24/2014   Common bile duct dilatation 10/20/2014   Pancreatic cyst 10/20/2014   Kidney stones 10/20/2014   Neuritis or radiculitis due to rupture of lumbar intervertebral disc 06/15/2014   Lumbar canal stenosis 06/15/2014   Degenerative arthritis of lumbar spine 06/15/2014   Lumbar radiculitis 06/15/2014   Osteoarthritis of spine with radiculopathy, lumbar region 06/15/2014   Carrier of infectious disease 07/21/2013   Bloodgood disease 11/05/2012   Arthritis of temporomandibular joint 06/24/2012  Atypical chest pain 01/05/2012   Breath shortness 12/04/2011   Lung mass 11/15/2011   Thyroid nodule 11/15/2011   Age-related macular degeneration, dry 10/03/2011   Disorder of peripheral nervous system 10/03/2011   Chronic rhinitis 03/03/2011   Carotid artery narrowing 03/01/2011   Polypharmacy 12/02/2010    Past Surgical History:  Procedure Laterality Date   ABDOMINAL HYSTERECTOMY     BREAST LUMPECTOMY     CATARACT EXTRACTION     CHOLECYSTECTOMY     JOINT REPLACEMENT Right    Hip   KNEE SURGERY Right     Prior to Admission medications   Medication Sig Start Date End Date Taking? Authorizing Provider  acetaminophen (TYLENOL) 500 MG tablet Take 500 mg by mouth every 4 (four) hours as needed for mild pain or fever.    [provider]   acetaminophen (TYLENOL) 650 MG CR tablet Take 650 mg by mouth 3 (three) times daily.    [provider]  acidophilus (RISAQUAD) CAPS capsule Take by mouth daily. Patient not taking: No sig reported    [provider]  alum & mag hydroxide-simeth (MAALOX/MYLANTA) 200-200-20 MG/5ML suspension Take 30 mLs by mouth at bedtime as needed for indigestion or heartburn.    [provider]  carbamide peroxide (GLY-OXIDE) 10 % solution Place 1 application onto teeth 4 (four) times daily -  with meals and at bedtime. (as needed for dry mouth)    [provider]  carvedilol (COREG) 6.25 MG tablet Take 1 tablet (6.25 mg total) by mouth 2 (two) times daily with a meal. 07/08/20   Wellington Hampshire, MD  Cholecalciferol 2000 UNITS TABS Take 2,000 Units by mouth daily.     Roselee Nova, MD  conjugated estrogens (PREMARIN) vaginal cream Apply 0.20m (pea-sized amount)  just inside the vaginal introitus with a finger-tip on  Monday, Wednesday and Friday nights. 04/15/19   MZara CouncilA, PA-C  Cyanocobalamin 1000 MCG/ML KIT Inject 1,000 mcg as directed every 30 (thirty) days.     [provider]  diclofenac sodium (VOLTAREN) 1 % GEL Apply 2 g topically 2 (two) times daily as needed (knee pain).    [provider]  docusate sodium (COLACE) 100 MG capsule Take 100 mg by mouth at bedtime.     [provider]  doxycycline (VIBRA-TABS) 100 MG tablet Take 1 tablet (100 mg total) by mouth 2 (two) times daily. 10/07/20   PBoneta LucksP, DPM  ELIQUIS 5 MG TABS tablet TAKE 1 TABLET(5 MG) BY MOUTH TWICE DAILY Patient taking differently: Take 5 mg by mouth 2 (two) times daily. 01/26/20   AWellington Hampshire MD  famotidine (PEPCID) 20 MG tablet Take 20 mg by mouth as needed for heartburn or indigestion. Patient not taking: No sig reported    [provider]  fluticasone (FLONASE) 50 MCG/ACT nasal spray Place 1 spray into both nostrils daily. 08/30/17    SWilhelmina Mcardle MD  Fluticasone Furoate (ARNUITY ELLIPTA) 100 MCG/ACT AEPB Inhale 1 puff into the lungs daily. 08/30/17   SWilhelmina Mcardle MD  furosemide (LASIX) 40 MG tablet Take 1 tablet (40 mg total) by mouth daily. 07/08/20   AWellington Hampshire MD  gabapentin (NEURONTIN) 300 MG capsule Take 300 mg by mouth at bedtime.    [provider]  Lidocaine-Glycerin (PREPARATION H EX) Apply topically daily as needed.    [provider]  loperamide (IMODIUM A-D) 2 MG tablet Take 2 mg by mouth as needed for  diarrhea or loose stools (max 4 doses daily).     [provider]  loratadine (CLARITIN) 10 MG tablet Take 10 mg by mouth daily.    [provider]  LORazepam (ATIVAN) 0.5 MG tablet Take 1 tablet (0.5 mg total) by mouth 2 (two) times daily as needed for anxiety. 05/23/17   Roselee Nova, MD  losartan (COZAAR) 100 MG tablet Take 1 tablet (100 mg total) by mouth daily. 05/23/17   Roselee Nova, MD  lovastatin (MEVACOR) 40 MG tablet Take 1 tablet (40 mg total) by mouth daily. 05/23/17   Roselee Nova, MD  Lutein 10 MG TABS Take 10 mg by mouth 2 (two) times daily. Patient not taking: Reported on 10/20/2020    [provider]  magnesium hydroxide (MILK OF MAGNESIA) 400 MG/5ML suspension Take 30 mLs by mouth at bedtime as needed for mild constipation.    [provider]  mirabegron ER (MYRBETRIQ) 25 MG TB24 tablet Take 25 mg by mouth daily.    [provider]  neomycin-bacitracin-polymyxin (NEOSPORIN) [phone] ointment Apply topically 4 (four) times daily as needed (wound care).    [provider]  polyethylene glycol (MIRALAX / GLYCOLAX) 17 g packet Take 17 g by mouth daily.     [provider]  Saccharomyces boulardii (PROBIOTIC) 250 MG CAPS Take 1 capsule by mouth daily.  Patient not taking: Reported on 10/20/2020    [provider]  Sodium Phosphates (ENEMA) 7-19 GM/118ML ENEM Place rectally.    [provider]  traMADol (ULTRAM) 50 MG tablet Take 50 mg by mouth daily.     [provider]  trimethoprim (TRIMPEX) 100 MG tablet Take 1 tablet (100 mg total) by mouth daily. 03/04/18   Bjorn Loser, MD    Allergies 2,4-d dimethylamine (amisol); Celebrex [celecoxib]; Ciprofloxacin; Ibuprofen; Levamisole; Metronidazole; Naproxen; and Other  Family History  Problem Relation Age of Onset   Stroke Mother    Diabetes Father    Heart disease Father    Kidney disease Neg Hx    Bladder Cancer Neg Hx    Kidney cancer Neg Hx     Social History Social History   Tobacco Use   Smoking status: Former    Pack years: 0.00   Smokeless tobacco: Never   Tobacco comments:    smoked in high school for a few years only  Vaping Use   Vaping Use: Never used  Substance Use Topics   Alcohol use: No    Alcohol/week: 0.0 standard drinks   Drug use: No    Review of Systems  Constitutional: No fever/chills.  Positive for fall. Eyes: No visual changes. ENT: No sore throat. Cardiovascular: Denies chest pain. Respiratory: Denies shortness of breath. Gastrointestinal: No abdominal pain.  No nausea, no vomiting.  No diarrhea.  No constipation. Genitourinary: Negative for dysuria. Musculoskeletal: Negative for back pain. Skin: Negative for rash. Neurological: Negative for headaches, focal weakness or numbness.  ____________________________________________   PHYSICAL EXAM:  VITAL SIGNS: ED Triage Vitals  Enc Vitals Group     BP 10/25/20 1244 (!) 167/75     Pulse Rate 10/25/20 1244 100     Resp 10/25/20 1244 17     Temp 10/25/20 1244 98 F (36.7 C)     Temp src --      SpO2 10/25/20 1244 96 %     Weight 10/25/20 1231 141 lb 1.5 oz (64 kg)     Height 10/25/20 1231 5'  6" (1.676 m)     Head Circumference --      Peak Flow --      Pain Score 10/25/20 1231 0     Pain Loc --      Pain Edu? --      Excl. in [place]? --     Constitutional: Alert and oriented. Eyes: Conjunctivae  are normal. Head: Atraumatic. Nose: No congestion/rhinnorhea. Mouth/Throat: Mucous membranes are moist. Neck: Normal ROM, no midline cervical spine tenderness to palpation. Cardiovascular: Normal rate, regular rhythm. Grossly normal heart sounds. Respiratory: Normal respiratory effort.  No retractions. Lungs CTAB. Gastrointestinal: Soft and nontender. No distention. Genitourinary: deferred Musculoskeletal: No lower extremity tenderness nor edema. Neurologic:  Normal speech and language. No gross focal neurologic deficits are appreciated. Skin:  Skin is warm, dry and intact. No rash noted. Psychiatric: Mood and affect are normal. Speech and behavior are normal.  ____________________________________________   LABS (all labs ordered are listed, but only abnormal results are displayed)  Labs Reviewed - No data to display   PROCEDURES  Procedure(s) performed (including Critical Care):  Procedures   ____________________________________________   INITIAL IMPRESSION / ASSESSMENT AND PLAN / ED COURSE      87 year old female with past medical history of hypertension, hyperlipidemia, CHF, atrial fibrillation on Eliquis, and thymus cancer who presents to the ED complaining of fall where she got tripped up on her postop shoe and fell backwards, hitting her head.  She is on blood thinners but did not lose consciousness and has no focal neurologic deficits on exam.  CT head reviewed by me with no obvious hemorrhage, negative for acute process per radiology.  CT cervical spine shows no acute fracture, does show thyroid nodules although patient states she is aware of this and has already seen her PCP for this problem.  She is appropriate for discharge home with PCP follow-up, was counseled to return to the ED for new or worsening symptoms.  Patient agrees with plan.      ____________________________________________   FINAL CLINICAL IMPRESSION(S) / ED DIAGNOSES  Final diagnoses:  Fall,  initial encounter     ED Discharge Orders     None        Note:  This document was prepared using Dragon voice recognition software and may include unintentional dictation errors.    Blake Divine, MD 10/25/20 1420

## 2020-10-27 ENCOUNTER — Encounter: Payer: Self-pay | Admitting: Podiatry

## 2020-10-27 NOTE — Progress Notes (Signed)
Subjective:  Patient ID: Jodi Bullock, female    DOB: 1925-03-17,  MRN: [id number]  Chief Complaint  Patient presents with   Foot Ulcer    Right foot ulcer     87 y.o. female presents for wound care.  Patient presents for follow-up of right submetatarsal 1 ulcer and right third digit ulcer.  She states she is doing well.  She has been doing the dressing changes.  She has been keeping it covered.  She states that the third toe is doing a lot better.  She denies any other acute complaints.   Review of Systems: Negative except as noted in the HPI. Denies N/V/F/Ch.  Past Medical History:  Diagnosis Date   Arthritis    Atrial flutter ([place])    a. Dx 07/2018. CHA2DS2VASc = 5-->Eliquis.   Bladder incontinence    Breast cancer ([place]) 1998   Chronic heart failure with improved ejection fraction (HFimpEF) 01/10/2016   a. 09/2015 Echo: EF 40-45%, possible inf HK, mod PAH (PASP 68mHg); b. 11/2018 Echo: EF 60-65%, RVSP 6676mg. Mod dil LA, mildly dil RA. Mod MR.   Closed fracture of right femur ([facility]   a. 08/2017->Managed conservatively.   COVID-19 virus infection    a. 05/2019 - asymptomatic.   Depression    GERD (gastroesophageal reflux disease)    History of kidney stones    HLD (hyperlipidemia)    Hypertension    Neuropathy of both feet    NICM (nonischemic cardiomyopathy) ([facility]   a. 08/2015 Lexiscan MV: EF 39%, no ischemia; b. 09/2015 Echo: EF 40-45%; c. 11/2018 Echo: EF 60-65%.   Persistent atrial fibrillation (HCC)    Right Humerus head fracture    a. 07/2016 - managed conservatively.   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[facility]   cancer    Current Outpatient Medications:    acetaminophen (TYLENOL) 500 MG tablet, Take 500 mg by mouth every 4 (four) hours as needed for mild pain or fever., Disp: , Rfl:    acetaminophen (TYLENOL) 650 MG CR tablet, Take 650 mg by mouth 3 (three) times daily., Disp: , Rfl:    acidophilus (RISAQUAD) CAPS capsule, Take by mouth daily. (Patient not taking: No sig reported),  Disp: , Rfl:    alum & mag hydroxide-simeth (MAALOX/MYLANTA) 200-200-20 MG/5ML suspension, Take 30 mLs by mouth at bedtime as needed for indigestion or heartburn., Disp: , Rfl:    carbamide peroxide (GLY-OXIDE) 10 % solution, Place 1 application onto teeth 4 (four) times daily -  with meals and at bedtime. (as needed for dry mouth), Disp: , Rfl:    carvedilol (COREG) 6.25 MG tablet, Take 1 tablet (6.25 mg total) by mouth 2 (two) times daily with a meal., Disp: , Rfl:    Cholecalciferol 2000 UNITS TABS, Take 2,000 Units by mouth daily. , Disp: , Rfl:    conjugated estrogens (PREMARIN) vaginal cream, Apply 0.76m16mpea-sized amount)  just inside the vaginal introitus with a finger-tip on  Monday, Wednesday and Friday nights., Disp: 30 g, Rfl: 12   Cyanocobalamin 1000 MCG/ML KIT, Inject 1,000 mcg as directed every 30 (thirty) days. , Disp: , Rfl:    diclofenac sodium (VOLTAREN) 1 % GEL, Apply 2 g topically 2 (two) times daily as needed (knee pain)., Disp: , Rfl:    docusate sodium (COLACE) 100 MG capsule, Take 100 mg by mouth at bedtime. , Disp: , Rfl:    doxycycline (VIBRA-TABS) 100 MG tablet, Take 1 tablet (100 mg total) by mouth 2 (two) times daily.,  Disp: 40 tablet, Rfl: 0   ELIQUIS 5 MG TABS tablet, TAKE 1 TABLET(5 MG) BY MOUTH TWICE DAILY (Patient taking differently: Take 5 mg by mouth 2 (two) times daily.), Disp: 180 tablet, Rfl: 1   famotidine (PEPCID) 20 MG tablet, Take 20 mg by mouth as needed for heartburn or indigestion. (Patient not taking: No sig reported), Disp: , Rfl:    fluticasone (FLONASE) 50 MCG/ACT nasal spray, Place 1 spray into both nostrils daily., Disp: 16 g, Rfl: 2   Fluticasone Furoate (ARNUITY ELLIPTA) 100 MCG/ACT AEPB, Inhale 1 puff into the lungs daily., Disp: 30 each, Rfl: 10   furosemide (LASIX) 40 MG tablet, Take 1 tablet (40 mg total) by mouth daily., Disp: , Rfl:    gabapentin (NEURONTIN) 300 MG capsule, Take 300 mg by mouth at bedtime., Disp: , Rfl:     Lidocaine-Glycerin (PREPARATION H EX), Apply topically daily as needed., Disp: , Rfl:    loperamide (IMODIUM A-D) 2 MG tablet, Take 2 mg by mouth as needed for diarrhea or loose stools (max 4 doses daily). , Disp: , Rfl:    loratadine (CLARITIN) 10 MG tablet, Take 10 mg by mouth daily., Disp: , Rfl:    LORazepam (ATIVAN) 0.5 MG tablet, Take 1 tablet (0.5 mg total) by mouth 2 (two) times daily as needed for anxiety., Disp: 60 tablet, Rfl: 2   losartan (COZAAR) 100 MG tablet, Take 1 tablet (100 mg total) by mouth daily., Disp: 30 tablet, Rfl: 2   lovastatin (MEVACOR) 40 MG tablet, Take 1 tablet (40 mg total) by mouth daily., Disp: 90 tablet, Rfl: 0   Lutein 10 MG TABS, Take 10 mg by mouth 2 (two) times daily. (Patient not taking: Reported on 10/20/2020), Disp: , Rfl:    magnesium hydroxide (MILK OF MAGNESIA) 400 MG/5ML suspension, Take 30 mLs by mouth at bedtime as needed for mild constipation., Disp: , Rfl:    mirabegron ER (MYRBETRIQ) 25 MG TB24 tablet, Take 25 mg by mouth daily., Disp: , Rfl:    neomycin-bacitracin-polymyxin (NEOSPORIN) [phone] ointment, Apply topically 4 (four) times daily as needed (wound care)., Disp: , Rfl:    polyethylene glycol (MIRALAX / GLYCOLAX) 17 g packet, Take 17 g by mouth daily. , Disp: , Rfl:    Saccharomyces boulardii (PROBIOTIC) 250 MG CAPS, Take 1 capsule by mouth daily.  (Patient not taking: Reported on 10/20/2020), Disp: , Rfl:    Sodium Phosphates (ENEMA) 7-19 GM/118ML ENEM, Place rectally., Disp: , Rfl:    traMADol (ULTRAM) 50 MG tablet, Take 50 mg by mouth daily. , Disp: , Rfl:    trimethoprim (TRIMPEX) 100 MG tablet, Take 1 tablet (100 mg total) by mouth daily., Disp: 30 tablet, Rfl: 11  Social History   Tobacco Use  Smoking Status Former   Pack years: 0.00  Smokeless Tobacco Never  Tobacco Comments   smoked in high school for a few years only    Allergies  Allergen Reactions   2,4-D Dimethylamine (Amisol) Other (See Comments)    Other Reaction:  OTHER REACTION-IRRITATION TO Esophagous   Celebrex [Celecoxib] Other (See Comments)     esophagitis Esophageal spasms     Ciprofloxacin Other (See Comments)    Esophageal irritation   Ibuprofen Other (See Comments)    Other reaction(s): Other (See Comments) reflux   Levamisole Other (See Comments) and Nausea And Vomiting   Metronidazole Other (See Comments)    neuropathy   Naproxen Other (See Comments)    GI UPSET   Other  Other (See Comments)    Other Reaction: esophagitis   Objective:  There were no vitals filed for this visit. There is no height or weight on file to calculate BMI. Constitutional Well developed. Well nourished.  Vascular Dorsalis pedis pulses palpable bilaterally. Posterior tibial pulses palpable bilaterally. Capillary refill normal to all digits.  No cyanosis or clubbing noted. Pedal hair growth normal.  Neurologic Normal speech. Oriented to person, place, and time. Protective sensation absent  Dermatologic Wound Location: Right submetatarsal 1 ulceration.  Limited to the breakdown of the skin.  Cellulitis noted to the third digit up to the IPJ.  No purulent drainage noted.  No malodor present. Wound Base: Mixed Granular/Fibrotic Peri-wound: Reddened Exudate: Scant/small amount Serosanguinous exudate Wound Measurements: -See below  Right third digit ulceration clinically healed and we epithelialized.  No signs of wound.  Orthopedic: No pain to palpation either foot.   Radiographs: None Assessment:   1. Ulcer of right foot, limited to breakdown of skin [facility])     Plan:  Patient was evaluated and treated and all questions answered.  Right third digit ulceration -Clinically healed and we epithelialized.  Ulcer right submetatarsal 1  -Debridement as below. -Dressed with Betadine wet-to-dry, DSD. -Continue off-loading with surgical shoe.  Procedure: Excisional Debridement of Wound right submetatarsal 1 Tool: Sharp chisel blade/tissue  nipper Rationale: Removal of non-viable soft tissue from the wound to promote healing.  Anesthesia: none Pre-Debridement Wound Measurements: 0.4 cm x 0.4 cm x 0.2 cm  Post-Debridement Wound Measurements: 0.5 cm x 0.5 cm x 0.2 cm  Type of Debridement: Sharp Excisional Tissue Removed: Non-viable soft tissue Blood loss: Minimal (<50cc) Depth of Debridement: subcutaneous tissue. Technique: Sharp excisional debridement to bleeding, viable wound base.  Wound Progress: The wound is decreasing with continued local wound care Dressing: Dry, sterile, compression dressing. Disposition: Patient tolerated procedure well. Patient to return in 1 week for follow-up. Marland Kitchen  No follow-ups on file.

## 2020-10-31 IMAGING — US RIGHT LOWER EXTREMITY VENOUS ULTRASOUND
1 series · 13 of 24 positions shown · non-contrast
Comparison: 10/26/2017.

CLINICAL DATA: Swelling and pain.



[Series 1: right lower extremity venous ultrasound · 13 of 30 slices shown]
[im 1/30]
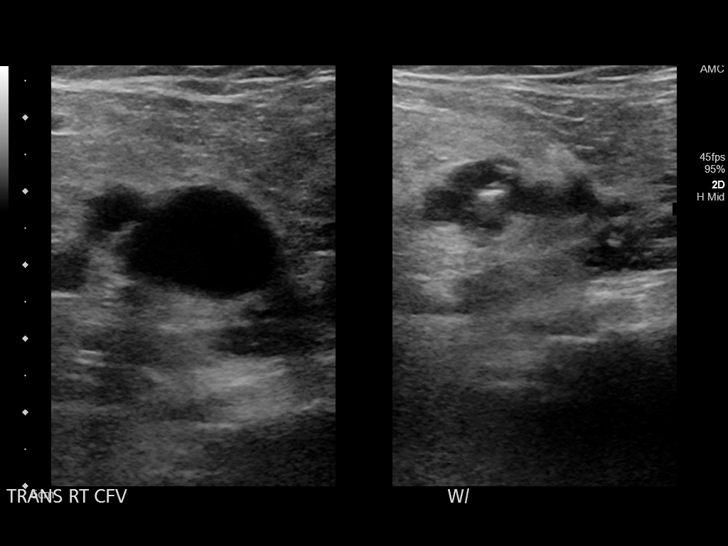
[im 3/30]
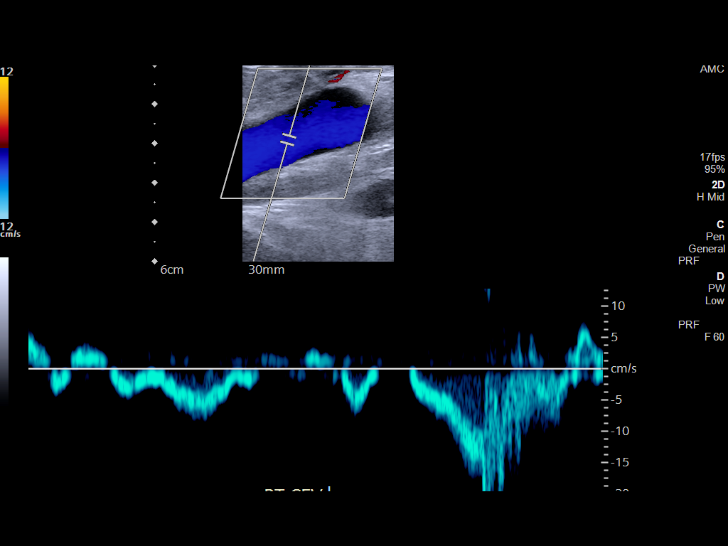
[im 6/30]
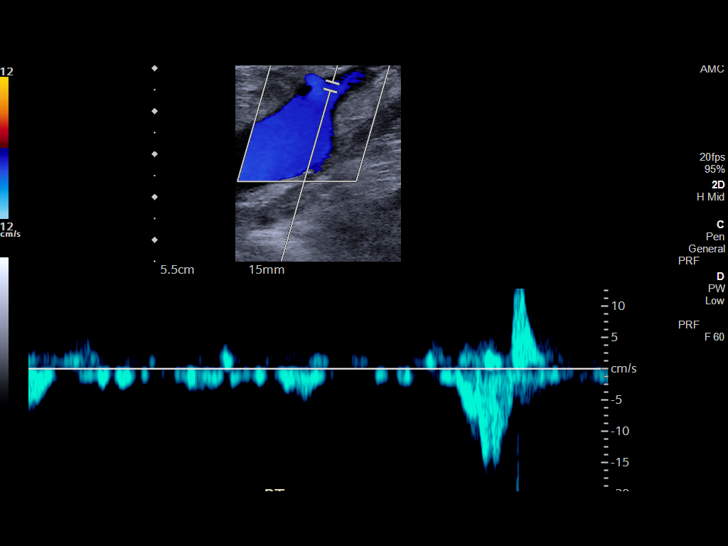
[im 8/30]
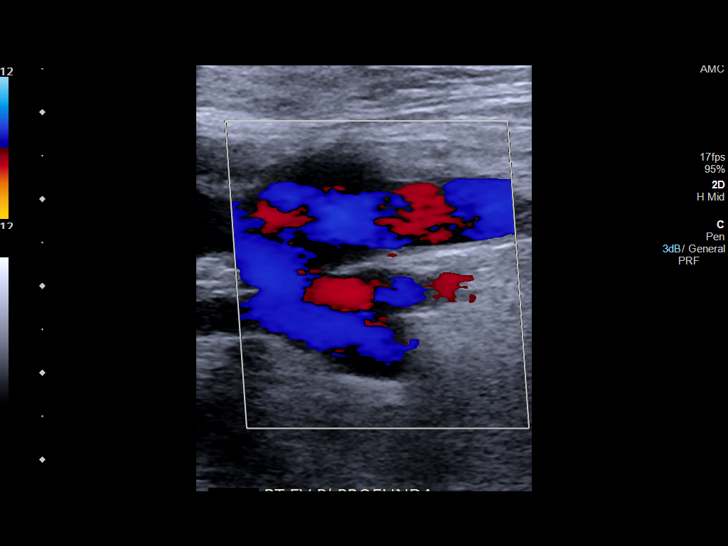
[im 11/30]
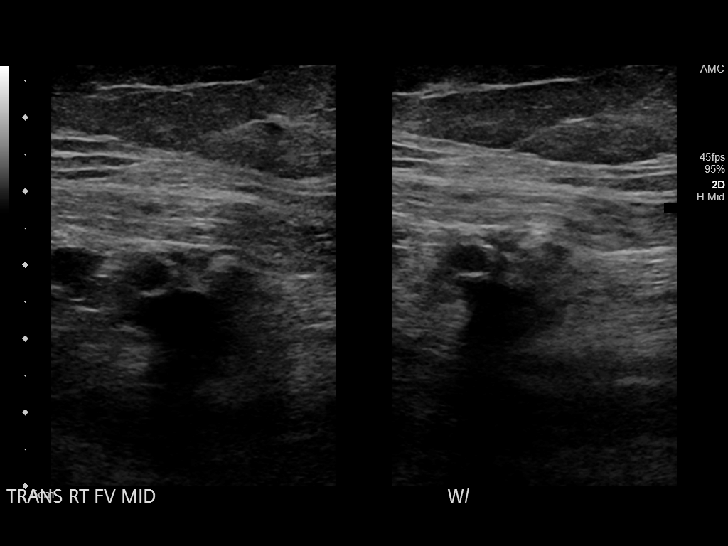
[im 13/30]
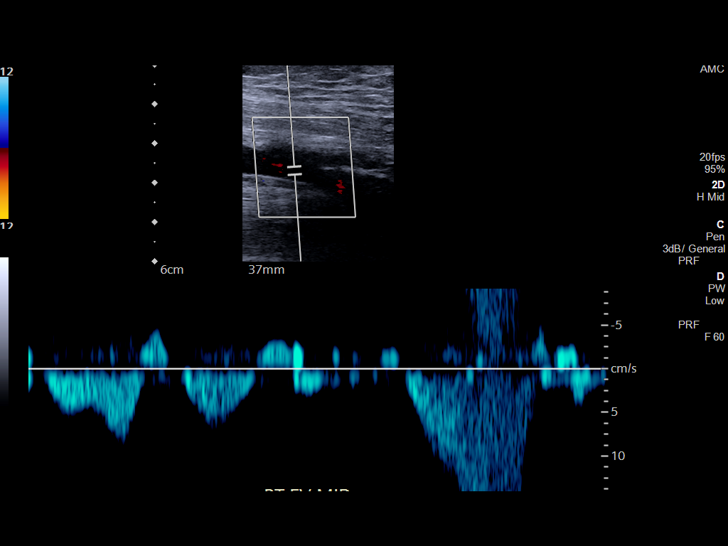
[im 16/30]
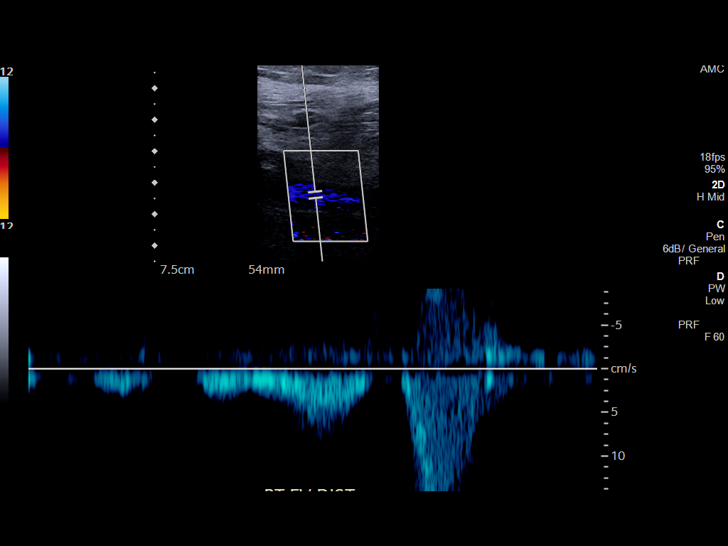
[im 17/30]
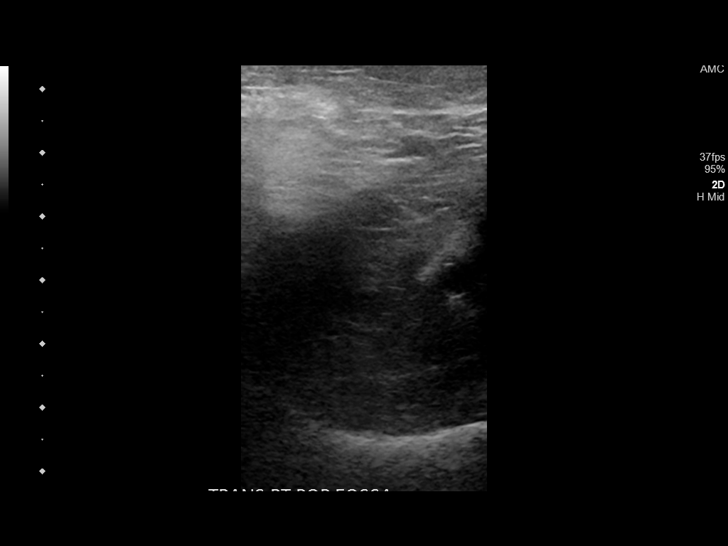
[im 19/30]
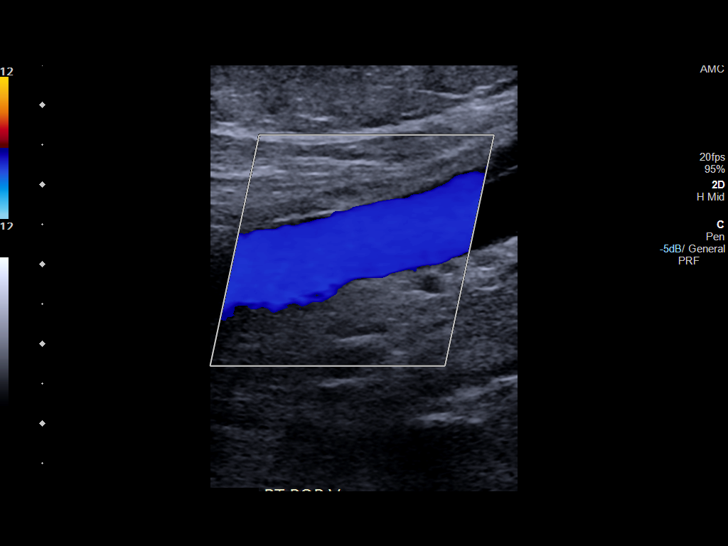
[im 22/30]
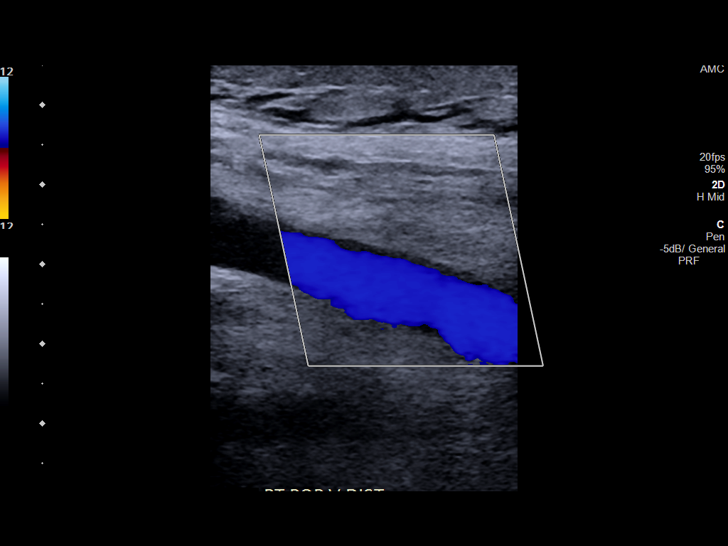
[im 24/30]
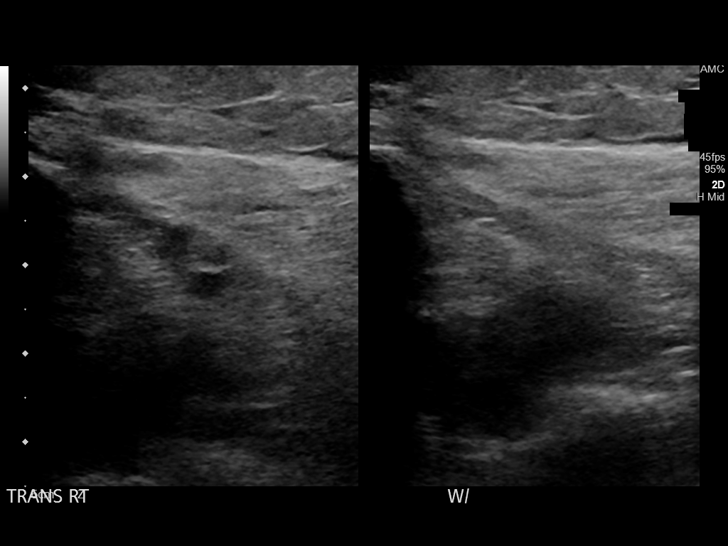
[im 27/30]
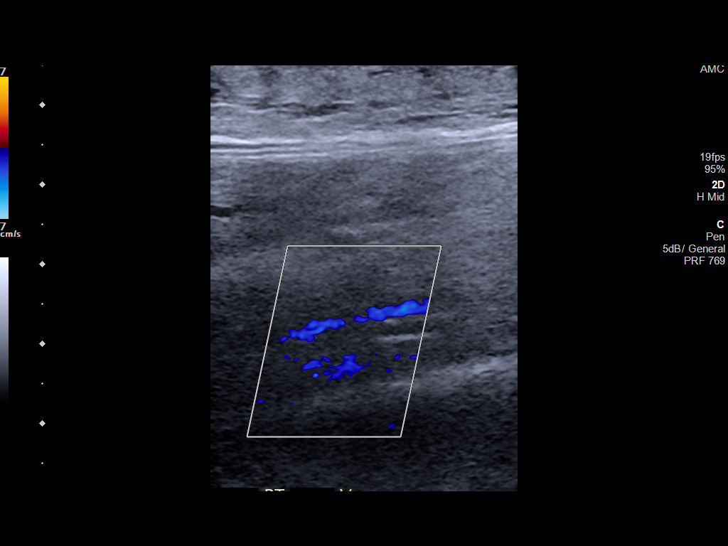
[im 30/30]
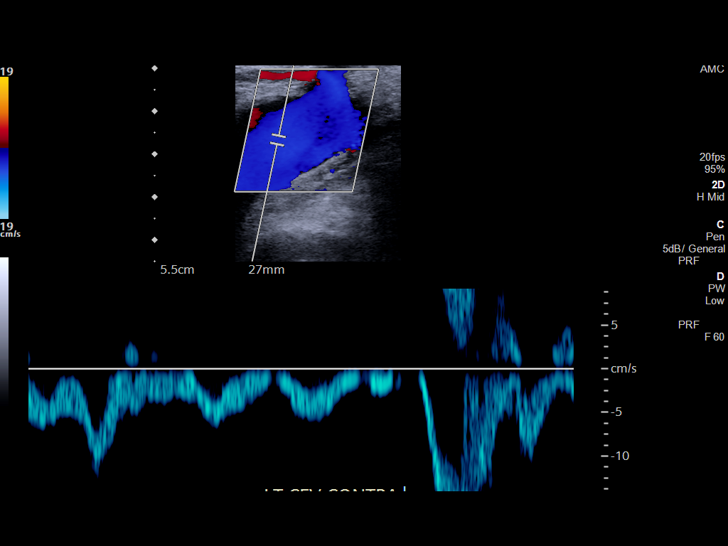

[13 of 24 positions shown; findings below may reference images not displayed]

FINDINGS: Contralateral Common Femoral Vein: Respiratory phasicity is normal
and symmetric with the symptomatic side. No evidence of thrombus.
Normal compressibility.

Common Femoral Vein: No evidence of thrombus. Normal
compressibility, respiratory phasicity and response to augmentation.

Saphenofemoral Junction: No evidence of thrombus. Normal
compressibility and flow on color Doppler imaging.

Profunda Femoral Vein: No evidence of thrombus. Normal
compressibility and flow on color Doppler imaging.

Femoral Vein: No evidence of thrombus. Normal compressibility,
respiratory phasicity and response to augmentation.

Popliteal Vein: No evidence of thrombus. Normal compressibility,
respiratory phasicity and response to augmentation.

Calf Veins: No evidence of thrombus. Normal compressibility and flow
on color Doppler imaging.

Other Findings:  None.
IMPRESSION: No evidence of deep venous thrombosis.

## 2020-11-16 ENCOUNTER — Telehealth: Payer: Self-pay | Admitting: Cardiovascular Disease

## 2020-11-16 NOTE — Telephone Encounter (Signed)
To Dr. Fletcher Anon to review/ advise regarding the patient's message as below.

## 2020-11-16 NOTE — Telephone Encounter (Signed)
Pt c/o swelling: STAT is pt has developed SOB within 24 hours  If swelling, where is the swelling located?  No particular swelling   How much weight have you gained and in what time span? Going up a pound a day the last 3 or 4 days - from 144-146  Have you gained 3 pounds in a day or 5 pounds in a week? no  Do you have a log of your daily weights (if so, list)? 144, 145, 146  Are you currently taking a fluid pill? Yes, would like to know if she can take an extra, nurse at facility will need an order  Are you currently SOB? SOB when she walks to the dining area   Have you traveled recently? no

## 2020-11-17 NOTE — Telephone Encounter (Signed)
Left voicemail message to call back for review of medications and to confirm appointment.

## 2020-11-17 NOTE — Telephone Encounter (Signed)
Please confirm that current lasix dose is 40mg  daily, as we have in our records.  She may take additional 40mg  of lasix today and tomorrow and we can re-evaluate at 1:30 PM visit tomorrow afternoon.

## 2020-11-17 NOTE — Telephone Encounter (Addendum)
Patient is usually seen by Ignacia Bayley, NP will route to CB to review. Pt has an appt on 11/18/20 with Gerald Stabs.

## 2020-11-18 ENCOUNTER — Other Ambulatory Visit: Payer: Self-pay

## 2020-11-18 ENCOUNTER — Ambulatory Visit (INDEPENDENT_AMBULATORY_CARE_PROVIDER_SITE_OTHER): Payer: Medicare Other | Admitting: Nurse Practitioner

## 2020-11-18 ENCOUNTER — Encounter: Payer: Self-pay | Admitting: Nurse Practitioner

## 2020-11-18 VITALS — BP 160/100 | HR 82 | Ht 66.0 in | Wt 149.0 lb

## 2020-11-18 DIAGNOSIS — E782 Mixed hyperlipidemia: Secondary | ICD-10-CM

## 2020-11-18 DIAGNOSIS — I10 Essential (primary) hypertension: Secondary | ICD-10-CM | POA: Diagnosis not present

## 2020-11-18 DIAGNOSIS — I4821 Permanent atrial fibrillation: Secondary | ICD-10-CM | POA: Diagnosis not present

## 2020-11-18 DIAGNOSIS — I5033 Acute on chronic diastolic (congestive) heart failure: Secondary | ICD-10-CM | POA: Diagnosis not present

## 2020-11-18 MED ORDER — FUROSEMIDE 20 MG PO TABS: 20.0000 mg | ORAL_TABLET | ORAL | 0 refills | Status: DC

## 2020-11-18 NOTE — Progress Notes (Signed)
Office Visit    Patient Name: Jodi Bullock Date of Encounter: 11/18/2020  Primary Care Provider:  [facility] Primary Cardiologist:  Kathlyn Sacramento, MD  Chief Complaint    87 year old female with a history of nonischemic cardiomyopathy and subsequent improvement in LV function, chronic heart failure with improved EF, hypertension, hyperlipidemia, thymus cancer, right breast cancer, COVID-19 (January 2021), and permanent atrial fibrillation/flutter, who presents for follow-up of heart failure and lower extremity edema.  Past Medical History    Past Medical History:  Diagnosis Date   Arthritis    Atrial flutter ([place])    a. Dx 07/2018. CHA2DS2VASc = 5-->Eliquis.   Bladder incontinence    Breast cancer ([facility]) 1998   Chronic heart failure with improved ejection fraction (HFimpEF) 01/10/2016   a. 09/2015 Echo: EF 40-45%, possible inf HK, mod PAH (PASP 27mHg); b. 11/2018 Echo: EF 60-65%, RVSP 644mg. Mod dil LA, mildly dil RA. Mod MR.   [place] fracture of right femur [place]   a. 08/2017->Managed conservatively.   COVID-19 virus infection    a. 05/2019 - asymptomatic.   Depression    GERD (gastroesophageal reflux disease)    History of kidney stones    HLD (hyperlipidemia)    Hypertension    Neuropathy of both feet    NICM (nonischemic cardiomyopathy) ([facility]   a. 08/2015 Lexiscan MV: EF 39%, no ischemia; b. 09/2015 Echo: EF 40-45%; c. 11/2018 Echo: EF 60-65%.   Persistent atrial fibrillation ([facility])    Right Humerus head fracture    a. 07/2016 - managed conservatively.   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[facility]   cancer   Past Surgical History:  Procedure Laterality Date   ABDOMINAL HYSTERECTOMY     BREAST LUMPECTOMY     CATARACT EXTRACTION     CHOLECYSTECTOMY     JOINT REPLACEMENT Right    Hip   KNEE SURGERY Right     Allergies  Allergies  Allergen Reactions   2,4-D Dimethylamine (Amisol) Other (See Comments)    Other Reaction: OTHER REACTION-IRRITATION TO Esophagous    Celebrex [Celecoxib] Other (See Comments)     esophagitis Esophageal spasms     Ciprofloxacin Other (See Comments)    Esophageal irritation   Ibuprofen Other (See Comments)    Other reaction(s): Other (See Comments) reflux   Levamisole Other (See Comments) and Nausea And Vomiting   Metronidazole Other (See Comments)    neuropathy   Naproxen Other (See Comments)    GI UPSET   Other Other (See Comments)    Other Reaction: esophagitis    History of Present Illness    [age]ear old female with the above complex past medical history including hypertension, permanent atrial fibrillation/flutter, hyperlipidemia, GERD, nonischemic cardiomyopathy, heart failure with improved EF, thymus cancer, and remote breast cancer.  Cardiac history dates back to April 2017, when she was evaluated for exertional dyspnea.  Stress testing showed an EF of 39% without ischemia.  Follow-up echo showed an EF of 45-50% with possible inferior hypokinesis.  She has been medically managed.  In March 2020, she was diagnosed with atrial flutter, which has been permanent and asymptomatic.  She has been anticoagulated with Eliquis.  Most recent echocardiogram in July 2020 showed an EF of 60-65% with an RVSP of 61 mmHg.  In January 2021, she was diagnosed with COVID-19.  She was relatively asymptomatic and did not require hospitalization however, her husband was hospitalized for 4 days and then sent to rehab for 2 weeks.  She lives at  [place] [facility] and lost her husband earlier this year.  She was last seen in clinic in February, at which time her weight was 146 pounds, which is anywhere between 6 and 10 pounds above her dry weight.  Lasix was increased to 40 mg daily and then subsequently reduced back to 20 mg daily after weight dropped.  She weighs her self regularly and weight had been doing well until about 2 weeks ago when it started climbing and was 146 pounds on her scale today.  With this, she has had increasing lower  extremity edema and mild dyspnea.  She has been dealing with chronic low back pain.  In early June, she presented to the ED with lower abdominal and pelvic pain.  CT was unremarkable.  In late June, she presented after a fall and had a head CT which was normal.  She does not experience chest pain and denies palpitations, PND, orthopnea, dizziness, syncope, or early satiety.  Home Medications    Prior to Admission medications   Medication Sig Start Date End Date Taking? Authorizing Provider  acetaminophen (TYLENOL) 650 MG CR tablet Take 650 mg by mouth 3 (three) times daily.   Yes [provider]  Cholecalciferol 2000 UNITS TABS Take 2,000 Units by mouth daily.    Yes Keith Rake Asad A, MD  Cyanocobalamin 1000 MCG/ML KIT Inject 1,000 mcg as directed every 30 (thirty) days.    Yes [provider]  diclofenac sodium (VOLTAREN) 1 % GEL Apply 2 g topically 2 (two) times daily as needed (knee pain).   Yes [provider]  docusate sodium (COLACE) 100 MG capsule Take 100 mg by mouth at bedtime.    Yes [provider]  doxycycline (VIBRA-TABS) 100 MG tablet Take 1 tablet (100 mg total) by mouth 2 (two) times daily. 10/07/20  Yes Patel, Kevin P, DPM  ELIQUIS 5 MG TABS tablet TAKE 1 TABLET(5 MG) BY MOUTH TWICE DAILY 01/26/20  Yes Wellington Hampshire, MD  famotidine (PEPCID) 20 MG tablet Take 20 mg by mouth as needed for heartburn or indigestion.   Yes [provider]  fluticasone (FLONASE) 50 MCG/ACT nasal spray Place 1 spray into both nostrils daily. 08/30/17  Yes Wilhelmina Mcardle, MD  Fluticasone Furoate (ARNUITY ELLIPTA) 100 MCG/ACT AEPB Inhale 1 puff into the lungs daily. 08/30/17  Yes Wilhelmina Mcardle, MD  furosemide (LASIX) 20 MG tablet Take 20 mg by mouth daily.   Yes [provider]  gabapentin (NEURONTIN) 300 MG capsule Take 300 mg by mouth at bedtime.   Yes [provider]  Lidocaine-Glycerin (PREPARATION H EX) Apply topically daily as  needed.   Yes [provider]  loratadine (CLARITIN) 10 MG tablet Take 10 mg by mouth daily.   Yes [provider]  LORazepam (ATIVAN) 0.5 MG tablet Take 1 tablet (0.5 mg total) by mouth 2 (two) times daily as needed for anxiety. 05/23/17  Yes Roselee Nova, MD  losartan (COZAAR) 100 MG tablet Take 1 tablet (100 mg total) by mouth daily. 05/23/17  Yes Roselee Nova, MD  lovastatin (MEVACOR) 40 MG tablet Take 40 mg by mouth every Monday, Wednesday, and Friday.   Yes [provider]  magnesium hydroxide (MILK OF MAGNESIA) 400 MG/5ML suspension Take 30 mLs by mouth at bedtime as needed for mild constipation.   Yes [provider]  mirabegron ER (MYRBETRIQ) 25 MG TB24 tablet Take 25 mg by mouth daily.   Yes [provider]  neomycin-bacitracin-polymyxin (NEOSPORIN) [phone] ointment Apply topically 4 (four) times daily as needed (wound care).   Yes [provider]  polyethylene glycol (MIRALAX / GLYCOLAX) 17 g packet Take 17 g by mouth daily.    Yes [provider]  Sodium Phosphates (ENEMA) 7-19 GM/118ML ENEM Place rectally.   Yes [provider]  traMADol (ULTRAM) 50 MG tablet Take 50 mg by mouth daily.    Yes [provider]  trimethoprim (TRIMPEX) 100 MG tablet Take 1 tablet (100 mg total) by mouth daily. 03/04/18  Yes MacDiarmid, Nicki Reaper, MD  alum & mag hydroxide-simeth (MAALOX/MYLANTA) 200-200-20 MG/5ML suspension Take 30 mLs by mouth at bedtime as needed for indigestion or heartburn. Patient not taking: Reported on 11/18/2020    [provider]  carbamide peroxide (GLY-OXIDE) 10 % solution Place 1 application onto teeth 4 (four) times daily -  with meals and at bedtime. (as needed for dry mouth) Patient not taking: Reported on 11/18/2020    [provider]  carvedilol (COREG) 6.25 MG tablet Take 1 tablet (6.25 mg total) by mouth 2 (two) times daily with a meal. 07/08/20   Wellington Hampshire, MD   conjugated estrogens (PREMARIN) vaginal cream Apply 0.32m (pea-sized amount)  just inside the vaginal introitus with a finger-tip on  Monday, Wednesday and Friday nights. Patient not taking: Reported on 11/18/2020 04/15/19   MZara CouncilA, PA-C  loperamide (IMODIUM A-D) 2 MG tablet Take 2 mg by mouth as needed for diarrhea or loose stools (max 4 doses daily).  Patient not taking: Reported on 11/18/2020    [provider]  Saccharomyces boulardii (PROBIOTIC) 250 MG CAPS Take 1 capsule by mouth daily.  Patient not taking: No sig reported    [provider]    Review of Systems    Chronic low back pain.  Increasing lower extremity edema with about 10 pound weight gain over the past 2 weeks.  With this, she has had more dyspnea.  She denies chest pain, palpitations, PND, orthopnea, dizziness, syncope, or early satiety.  She has significant grief following the death of her husband earlier this year.  All other systems reviewed and are otherwise negative except as noted above.  Physical Exam    VS:  BP (!) 160/100 (BP Location: Right Arm, Patient Position: Sitting, Cuff Size: Normal)   Pulse 82   Ht '5\' 6"'  (1.676 m)   Wt 149 lb (67.6 kg)   SpO2 93%   BMI 24.05 kg/m  , BMI Body mass index is 24.05 kg/m. GEN: Well nourished, well developed, in no acute distress. HEENT: normal. Neck: Supple, no JVD, carotid bruits, or masses. Cardiac: Irregularly irregular, no murmurs, rubs, or gallops. No clubbing, cyanosis.  2+ bilateral lower extremity edema to the knees.  Radials 2+/PT 1+ and equal bilaterally.  Respiratory:  Respirations regular and unlabored, clear to auscultation bilaterally. GI: Soft, nontender, nondistended, BS + x 4. MS: no deformity or atrophy. Skin: warm and dry, no rash. Neuro:  Strength and sensation are intact. Psych: Normal affect.  Accessory Clinical Findings    ECG personally reviewed by me today -atrial fibrillation, 82, PVCs, delayed R wave  progression- no acute changes.  Lab Results  Component Value Date   WBC 8.1 10/20/2020   HGB 14.1 10/20/2020   HCT 43.0 10/20/2020   MCV 95.3 10/20/2020   PLT 179 10/20/2020   Lab Results  Component Value Date   CREATININE 0.86 10/20/2020   BUN 18 10/20/2020   NA 140  10/20/2020   K 4.2 10/20/2020   CL 106 10/20/2020   CO2 27 10/20/2020   Lab Results  Component Value Date   ALT 15 10/20/2020   AST 22 10/20/2020   ALKPHOS 62 10/20/2020   BILITOT 1.4 (H) 10/20/2020   Lab Results  Component Value Date   CHOL 139 05/24/2017   HDL 48 (L) 05/24/2017   LDLCALC 67 05/24/2017   TRIG 163 (H) 05/24/2017   CHOLHDL 2.9 05/24/2017    Lab Results  Component Value Date   HGBA1C 5.4 02/14/2016    Assessment & Plan    1.  Acute on chronic heart failure with improved ejection fraction: EF 60-65% by echo in June 2020.  Over the past 2 weeks, her weight is up about 10 pounds and she has been having increasing lower extremity swelling and dyspnea.  She has evidence of volume overload on exam today.  She has been taking Lasix 20 mg daily.  Previously, she was able to take an additional 20 mg for volume excess however, her current living situation does not allow for that as all of her medications are provided by house staff.  I am going to increase her Lasix to 40 mg daily until weight is less than 140 pounds.  At that point, she can drop 20 mg daily with 20 mg as needed for weight gain greater than 2 pounds in 24 hours or 5 pounds in the course of the week.  Follow-up basic metabolic panel in a week.  Heart rate is stable.  Blood pressure is elevated today however this will likely come down with diuresis.  Continue beta-blocker and ARB.  2.  Permanent atrial fibrillation: Heart rate 82 and stable.  Beta-blocker dose previously reduced down to 6.25 mg twice daily in the setting of bradycardia.  She remains anticoagulated with Eliquis and had recent labs in June showing stable electrolytes, renal  function, and blood counts.  3.  Essential hypertension: Adjusting diuretic dose.  4.  Hyperlipidemia: Remains on statin therapy.  Lipids followed by primary care.  5.  Disposition: Follow-up basic metabolic panel through [facility] in 1 week.  Follow-up in clinic in 1 month or sooner if necessary.  Murray Hodgkins, NP 11/18/2020, 4:46 PM

## 2020-11-18 NOTE — Patient Instructions (Addendum)
Medication Instructions:  Your physician has recommended you make the following change in your medication:   CHANGE Furosemide 20 mg and take 2 tablets (40 mg) once daily for weight greater than 140 pounds. Once weight is down under 140 pounds take 1 tablet (20 mg) once daily with extra tablet (20 mg) as needed for weight gain of 2 pounds overnight or 5 pounds in a week.   *If you need a refill on your cardiac medications before your next appointment, please call your pharmacy*   Lab Work: BMP to be done in one week at [facility].   If you have labs (blood work) drawn today and your tests are completely normal, you will receive your results only by: [facility] (if you have MyChart) OR A paper copy in the mail If you have any lab test that is abnormal or we need to change your treatment, we will call you to review the results.   Testing/Procedures: None   Follow-Up: At [facility], you and your health needs are our priority.  As part of our continuing mission to provide you with exceptional heart care, we have created designated Provider Care Teams.  These Care Teams include your primary Cardiologist (physician) and Advanced Practice Providers (APPs -  Physician Assistants and Nurse Practitioners) who all work together to provide you with the care you need, when you need it.   Your next appointment:   1 month(s)  The format for your next appointment:   In Person  Provider:   Murray Hodgkins, NP

## 2020-11-19 NOTE — Telephone Encounter (Signed)
Patient was seen here in the office 7/7 and concerns have been addressed with orders provided. Closing encounter.

## 2020-11-25 ENCOUNTER — Telehealth: Payer: Self-pay | Admitting: Cardiovascular Disease

## 2020-11-25 NOTE — Telephone Encounter (Signed)
Spoke with patient and she states she has been doing good and feeling much better. She has tapered herself off medication and now only takes as needed. Confirmed upcoming appointment and reviewed that if she should have any weight gain she should resume medication with instructions given. She verbalized understanding of our conversation with no further questions at this time.

## 2020-11-25 NOTE — Telephone Encounter (Signed)
Left voicemail message to call back and apologies we are missing each other.

## 2020-11-25 NOTE — Telephone Encounter (Signed)
Patient calling to make C. [name] aware States she has only been taking 1 lasix and today her weight was 141 and BP was 123/68 - feels like she is doing well right now

## 2020-11-25 NOTE — Telephone Encounter (Signed)
Patient returning call.

## 2020-11-25 NOTE — Telephone Encounter (Signed)
Left voicemail message to call back  

## 2020-12-01 ENCOUNTER — Telehealth: Payer: Self-pay | Admitting: Cardiovascular Disease

## 2020-12-01 NOTE — Telephone Encounter (Signed)
Returned the patient call. Lmtcb.

## 2020-12-01 NOTE — Telephone Encounter (Signed)
Patient calling  States she is still having issue with weight and overall just does not feel good Wants to discuss possible changes to lasix  Please call to discuss

## 2020-12-02 ENCOUNTER — Ambulatory Visit (INDEPENDENT_AMBULATORY_CARE_PROVIDER_SITE_OTHER): Payer: Medicare Other | Admitting: Podiatry

## 2020-12-02 ENCOUNTER — Ambulatory Visit: Payer: Medicare Other | Admitting: Podiatry

## 2020-12-02 ENCOUNTER — Encounter: Payer: Self-pay | Admitting: Podiatry

## 2020-12-02 ENCOUNTER — Other Ambulatory Visit: Payer: Self-pay

## 2020-12-02 DIAGNOSIS — L97511 Non-pressure chronic ulcer of other part of right foot limited to breakdown of skin: Secondary | ICD-10-CM

## 2020-12-02 NOTE — Telephone Encounter (Signed)
Left voicemail message to call back for review of orders for our mutual patient.

## 2020-12-02 NOTE — Telephone Encounter (Signed)
Spoke with patient and reviewed current concerns. Current weight is 146 pounds and she wanted to clarify what dose she should be taking. Based on orders she should be taking 2 tablets until weight is less than 140. She verbalized understanding and requested that I call Otila Kluver or Jasmine to clarify dosing with them because they are not following the instructions. They are giving her one in the morning and then another at night. So she just wants [name] to call and review instructions again. Advised I would call to review this information with them again and answer any of their questions. Instructed her to call back if she had any further concerns. She acknowledged instructions and no further concerns.

## 2020-12-02 NOTE — Telephone Encounter (Signed)
[place] and requested to speak with Delana Meyer or Otila Kluver about mutual patient. They are currently in meeting so she wrote down my information and will have them call me back once available.

## 2020-12-02 NOTE — Progress Notes (Signed)
Subjective:  Patient ID: Jodi Bullock, female    DOB: 12-05-1924,  MRN: [id number]  Chief Complaint  Patient presents with   Foot Ulcer    Ulcer of right foot     87 y.o. female presents for wound care.  Patient presents for follow-up of right submetatarsal 1 ulceration.  Patient states she is doing well.  No acute complaints.  The third digit is still healed.  She denies any other acute complaints.  She is been doing regular dressing changes.   Review of Systems: Negative except as noted in the HPI. Denies N/V/F/Ch.  Past Medical History:  Diagnosis Date   Arthritis    Atrial flutter ([place])    a. Dx 07/2018. CHA2DS2VASc = 5-->Eliquis.   Bladder incontinence    Breast cancer ([place]) 1998   Chronic heart failure with improved ejection fraction (HFimpEF) 01/10/2016   a. 09/2015 Echo: EF 40-45%, possible inf HK, mod PAH (PASP 41mHg); b. 11/2018 Echo: EF 60-65%, RVSP 653mg. Mod dil LA, mildly dil RA. Mod MR.   Closed fracture of right femur ([facility]   a. 08/2017->Managed conservatively.   COVID-19 virus infection    a. 05/2019 - asymptomatic.   Depression    GERD (gastroesophageal reflux disease)    History of kidney stones    HLD (hyperlipidemia)    Hypertension    Neuropathy of both feet    [place]mic [place]) [place] [facility]   a. 08/2015 Lexiscan MV: EF 39%, no ischemia; b. 09/2015 Echo: EF 40-45%; c. 11/2018 Echo: EF 60-65%.   Persistent atrial fibrillation ([facility])    Right Humerus head fracture    a. 07/2016 - managed conservatively.   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 (Patient not taking: Reported on 11/18/2020), Disp: , Rfl:    carbamide peroxide (GLY-OXIDE) 10 % solution, Place 1 application onto  teeth 4 (four) times daily -  with meals and at bedtime. (as needed for dry mouth) (Patient not taking: Reported on 11/18/2020), Disp: , Rfl:    carvedilol (COREG) 6.25 MG tablet, Take 1 tablet (6.25 mg total) by mouth 2 (two) times daily with a meal., Disp: , Rfl:    Cholecalciferol 2000 UNITS TABS, Take 2,000 Units by mouth daily. , Disp: , Rfl:    conjugated estrogens (PREMARIN) vaginal cream, Apply 0.65m19mpea-sized amount)  just inside the vaginal introitus with a finger-tip on  Monday, Wednesday and Friday nights. (Patient not taking: Reported on 11/18/2020), Disp: 30 g, Rfl: 12   Cyanocobalamin 1000 MCG/ML KIT, Inject 1,000 mcg as directed every 30 (thirty) days. , Disp: , Rfl:    diclofenac sodium (VOLTAREN) 1 % GEL, Apply 2 g topically 2 (two) times daily as needed (knee pain)., Disp: , Rfl:    docusate sodium (COLACE) 100 MG capsule, Take 100 mg by mouth at bedtime. , Disp: , Rfl:    doxycycline (VIBRA-TABS) 100 MG tablet, Take 1 tablet (100 mg total) by mouth 2 (two) times daily., Disp: 40 tablet, Rfl: 0   ELIQUIS 5 MG TABS tablet, TAKE 1 TABLET(5 MG) BY MOUTH TWICE DAILY, Disp: 180 tablet, Rfl: 1   famotidine (PEPCID) 20 MG tablet, Take 20 mg by mouth as needed for heartburn  or indigestion., Disp: , Rfl:    fluticasone (FLONASE) 50 MCG/ACT nasal spray, Place 1 spray into both nostrils daily., Disp: 16 g, Rfl: 2   Fluticasone Furoate (ARNUITY ELLIPTA) 100 MCG/ACT AEPB, Inhale 1 puff into the lungs daily., Disp: 30 each, Rfl: 10   furosemide (LASIX) 20 MG tablet, Take 1-2 tablets (20-40 mg total) by mouth as directed. Take 2 tablets (40 mg) daily for weight greater than 140 pounds. When weight is less than 140 then take 1 tablet (20 mg) daily with an additional 20 mg as needed for weight gain overnight of 2 pounds., Disp: 180 tablet, Rfl: 0   gabapentin (NEURONTIN) 300 MG capsule, Take 300 mg by mouth at bedtime., Disp: , Rfl:    Lidocaine-Glycerin (PREPARATION H EX), Apply topically daily as  needed., Disp: , Rfl:    loperamide (IMODIUM A-D) 2 MG tablet, Take 2 mg by mouth as needed for diarrhea or loose stools (max 4 doses daily).  (Patient not taking: Reported on 11/18/2020), Disp: , Rfl:    loratadine (CLARITIN) 10 MG tablet, Take 10 mg by mouth daily., Disp: , Rfl:    LORazepam (ATIVAN) 0.5 MG tablet, Take 1 tablet (0.5 mg total) by mouth 2 (two) times daily as needed for anxiety., Disp: 60 tablet, Rfl: 2   losartan (COZAAR) 100 MG tablet, Take 1 tablet (100 mg total) by mouth daily., Disp: 30 tablet, Rfl: 2   lovastatin (MEVACOR) 40 MG tablet, Take 40 mg by mouth every Monday, Wednesday, and Friday., Disp: , Rfl:    magnesium hydroxide (MILK OF MAGNESIA) 400 MG/5ML suspension, Take 30 mLs by mouth at bedtime as needed for mild constipation., Disp: , Rfl:    mirabegron ER (MYRBETRIQ) 25 MG TB24 tablet, Take 25 mg by mouth daily., Disp: , Rfl:    neomycin-bacitracin-polymyxin (NEOSPORIN) [phone] ointment, Apply topically 4 (four) times daily as needed (wound care)., Disp: , Rfl:    polyethylene glycol (MIRALAX / GLYCOLAX) 17 g packet, Take 17 g by mouth daily. , Disp: , Rfl:    Saccharomyces boulardii (PROBIOTIC) 250 MG CAPS, Take 1 capsule by mouth daily.  (Patient not taking: No sig reported), Disp: , Rfl:    Sodium Phosphates (ENEMA) 7-19 GM/118ML ENEM, Place rectally., Disp: , Rfl:    traMADol (ULTRAM) 50 MG tablet, Take 50 mg by mouth daily. , Disp: , Rfl:    trimethoprim (TRIMPEX) 100 MG tablet, Take 1 tablet (100 mg total) by mouth daily., Disp: 30 tablet, Rfl: 11  Social History   Tobacco Use  Smoking Status Former  Smokeless Tobacco Never  Tobacco Comments   smoked in high school for a few years only    Allergies  Allergen Reactions   2,4-D Dimethylamine (Amisol) Other (See Comments)    Other Reaction: OTHER REACTION-IRRITATION TO Esophagous   Celebrex [Celecoxib] Other (See Comments)     esophagitis Esophageal spasms     Ciprofloxacin Other (See Comments)     Esophageal irritation   Ibuprofen Other (See Comments)    Other reaction(s): Other (See Comments) reflux   Levamisole Other (See Comments) and Nausea And Vomiting   Metronidazole Other (See Comments)    neuropathy   Naproxen Other (See Comments)    GI UPSET   Other Other (See Comments)    Other Reaction: esophagitis   Objective:  There were no vitals filed for this visit. There is no height or weight on file to calculate BMI. Constitutional Well developed. Well nourished.  Vascular Dorsalis pedis pulses  palpable bilaterally. Posterior tibial pulses palpable bilaterally. Capillary refill normal to all digits.  No cyanosis or clubbing noted. Pedal hair growth normal.  Neurologic Normal speech. Oriented to person, place, and time. Protective sensation absent  Dermatologic Wound Location: Right submetatarsal 1 ulceration.  Limited to the breakdown of the skin.  Cellulitis noted to the third digit up to the IPJ.  No purulent drainage noted.  No malodor present. Wound Base: Mixed Granular/Fibrotic Peri-wound: Reddened Exudate: Scant/small amount Serosanguinous exudate Wound Measurements: -See below  Right third digit ulceration clinically healed and we epithelialized.  No signs of wound.  Orthopedic: No pain to palpation either foot.   Radiographs: None Assessment:   1. Ulcer of right foot, limited to breakdown of skin [facility])      Plan:  Patient was evaluated and treated and all questions answered.  Right third digit ulceration -Clinically healed and we epithelialized.  Ulcer right submetatarsal 1  -Debridement as below. -Dressed with triple antibiotic and a Band-Aid, DSD. -Continue off-loading with surgical shoe.  Procedure: Excisional Debridement of Wound right submetatarsal 1 Tool: Sharp chisel blade/tissue nipper Rationale: Removal of non-viable soft tissue from the wound to promote healing.  Anesthesia: none Pre-Debridement Wound Measurements: 0.3cm x 0.3 cm x  0.2 cm  Post-Debridement Wound Measurements: 0.4 cm x 0.4 cm x 0.2 cm  Type of Debridement: Sharp Excisional Tissue Removed: Non-viable soft tissue Blood loss: Minimal (<50cc) Depth of Debridement: subcutaneous tissue. Technique: Sharp excisional debridement to bleeding, viable wound base.  Wound Progress: The wound is decreasing with continued local wound care Dressing: Dry, sterile, compression dressing. Disposition: Patient tolerated procedure well. Patient to return in 1 week for follow-up. Marland Kitchen  No follow-ups on file.

## 2020-12-02 NOTE — Telephone Encounter (Signed)
Spoke with Jodi Bullock at [facility] and reviewed patients request to clarify dosing of her furosemide. She reports that patient is given Furosemide 40 mg tablet daily when weight is elevated. If additional 20 mg is needed they she only receives 20 mg pill. She states that when they give her the 40 mg pill she requests them to cut in half. Recommended they give her Furosamide 20 mg and use 2 tablets to equal 40 mg since she questions if they are giving her 20 or 40. She will make sure they update her on this change and clarified all other instructions. She verbalized understanding of our conversation, agreement with plan, and had no further questions at this time.

## 2020-12-02 NOTE — Telephone Encounter (Signed)
Spoke with patient and reviewed that I did review instructions with Jodi Bullock there at [facility]. Advised they would update her pill box pack but that it may take a few days to get that switched. She verbalized understanding with no further questions at this time.

## 2020-12-23 ENCOUNTER — Telehealth: Payer: Self-pay | Admitting: Nurse Practitioner

## 2020-12-23 NOTE — Telephone Encounter (Signed)
Spoke with the patient. Reviewed with the patient her Lasix instructions.  Take 1-2 tablets (20-40 mg total) by mouth as directed. Take 2 tablets (40 mg) daily for weight greater than 140 pounds. When weight is less than 140 then take 1 tablet (20 mg) daily with an additional 20 mg as needed for weight gain overnight of 2 pounds.,   Patients weight this morning 141 lb. Her weights have been running 139-141 lbs over the last week.  Adv the patient to continue daily weights and the sliding scale lasix. Patient to contact the office if her weight is up 2 lbs overnight or 5 lbs in a week.  Patient verbalized understanding and voiced appreciation for the call.

## 2020-12-23 NOTE — Telephone Encounter (Signed)
Patient states she was awoken during the middle of the night extremely thirsty and drank some water and could not stop urinating. States she has went 5 times since 10 am. Please call to discuss her Lasix.

## 2020-12-28 ENCOUNTER — Ambulatory Visit: Payer: Medicare Other | Admitting: Nurse Practitioner

## 2021-01-09 ENCOUNTER — Other Ambulatory Visit: Payer: Self-pay

## 2021-01-09 ENCOUNTER — Emergency Department: Payer: Medicare Other

## 2021-01-09 ENCOUNTER — Emergency Department
Admission: EM | Admit: 2021-01-09 | Discharge: 2021-01-09 | Disposition: A | Discharge status: Home / Self Care | Payer: Medicare Other | Attending: Emergency Medicine | Admitting: Emergency Medicine

## 2021-01-09 DIAGNOSIS — Z87891 Personal history of nicotine dependence: Secondary | ICD-10-CM | POA: Diagnosis not present

## 2021-01-09 DIAGNOSIS — Z853 Personal history of malignant neoplasm of breast: Secondary | ICD-10-CM | POA: Diagnosis not present

## 2021-01-09 DIAGNOSIS — S066X0A Traumatic subarachnoid hemorrhage without loss of consciousness, initial encounter: Secondary | ICD-10-CM | POA: Diagnosis not present

## 2021-01-09 DIAGNOSIS — W19XXXA Unspecified fall, initial encounter: Secondary | ICD-10-CM

## 2021-01-09 DIAGNOSIS — S0083XA Contusion of other part of head, initial encounter: Secondary | ICD-10-CM

## 2021-01-09 DIAGNOSIS — W06XXXA Fall from bed, initial encounter: Secondary | ICD-10-CM | POA: Insufficient documentation

## 2021-01-09 DIAGNOSIS — Z8616 Personal history of COVID-19: Secondary | ICD-10-CM | POA: Insufficient documentation

## 2021-01-09 DIAGNOSIS — Z96641 Presence of right artificial hip joint: Secondary | ICD-10-CM | POA: Insufficient documentation

## 2021-01-09 DIAGNOSIS — Z85238 Personal history of other malignant neoplasm of thymus: Secondary | ICD-10-CM | POA: Insufficient documentation

## 2021-01-09 DIAGNOSIS — S01112A Laceration without foreign body of left eyelid and periocular area, initial encounter: Secondary | ICD-10-CM | POA: Diagnosis not present

## 2021-01-09 DIAGNOSIS — F0390 Unspecified dementia without behavioral disturbance: Secondary | ICD-10-CM | POA: Insufficient documentation

## 2021-01-09 DIAGNOSIS — S0990XA Unspecified injury of head, initial encounter: Secondary | ICD-10-CM | POA: Diagnosis present

## 2021-01-09 DIAGNOSIS — I5020 Unspecified systolic (congestive) heart failure: Secondary | ICD-10-CM | POA: Insufficient documentation

## 2021-01-09 DIAGNOSIS — I110 Hypertensive heart disease with heart failure: Secondary | ICD-10-CM | POA: Diagnosis not present

## 2021-01-09 DIAGNOSIS — I609 Nontraumatic subarachnoid hemorrhage, unspecified: Secondary | ICD-10-CM

## 2021-01-09 LAB — CBC
HCT: 46.5 % — ABNORMAL HIGH (ref 36.0–46.0)
Hemoglobin: 15.8 g/dL — ABNORMAL HIGH (ref 12.0–15.0)
MCH: 33.3 pg (ref 26.0–34.0)
MCHC: 34 g/dL (ref 30.0–36.0)
MCV: 97.9 fL (ref 80.0–100.0)
Platelets: 140 10*3/uL — ABNORMAL LOW (ref 150–400)
RBC: 4.75 MIL/uL (ref 3.87–5.11)
RDW: 12.7 % (ref 11.5–15.5)
WBC: 10.1 10*3/uL (ref 4.0–10.5)
nRBC: 0 % (ref 0.0–0.2)

## 2021-01-09 LAB — BASIC METABOLIC PANEL
Anion gap: 9 (ref 5–15)
BUN: 23 mg/dL (ref 8–23)
CO2: 29 mmol/L (ref 22–32)
Calcium: 9.4 mg/dL (ref 8.9–10.3)
Chloride: 103 mmol/L (ref 98–111)
Creatinine, Ser: 0.92 mg/dL (ref 0.44–1.00)
GFR, Estimated: 58 mL/min — ABNORMAL LOW (ref >60)
Glucose, Bld: 116 mg/dL — ABNORMAL HIGH (ref 70–99)
Potassium: 4 mmol/L (ref 3.5–5.1)
Sodium: 141 mmol/L (ref 135–145)

## 2021-01-09 LAB — APTT: aPTT: 43 seconds — ABNORMAL HIGH (ref 24–36)

## 2021-01-09 LAB — TYPE AND SCREEN
ABO/RH(D): O POS
Antibody Screen: NEGATIVE

## 2021-01-09 LAB — PROTIME-INR
INR: 1.4 — ABNORMAL HIGH (ref 0.8–1.2)
Prothrombin Time: 17 seconds — ABNORMAL HIGH (ref 11.4–15.2)

## 2021-01-09 MED ORDER — NICARDIPINE HCL IN NACL 20-0.86 MG/200ML-% IV SOLN
3.0000 mg/h | INTRAVENOUS | Status: DC
  Administered 2021-01-09: 5 mg/h via INTRAVENOUS
  Filled 2021-01-09: qty 200

## 2021-01-09 MED ORDER — PROTHROMBIN COMPLEX CONC HUMAN 500 UNITS IV KIT
3130.0000 [IU] | PACK | Status: AC
  Administered 2021-01-09: 3130 [IU] via INTRAVENOUS
  Filled 2021-01-09: qty 3130

## 2021-01-09 NOTE — ED Notes (Signed)
Dr. Cheri Fowler MD at bedside to speak to the granddaughter at this time.

## 2021-01-09 NOTE — ED Notes (Signed)
Patient transported to CT 

## 2021-01-09 NOTE — ED Notes (Addendum)
Pt's laceration to right eye cleaned with saline and gauze by this RN.

## 2021-01-09 NOTE — ED Provider Notes (Signed)
-----------------------------------------   7:28 AM on 01/09/2021 -----------------------------------------  Took call from Dr. Clovis Riley with radiology, patient has traumatic subarachnoid hemorrhage.  Informed charge nurse and daytime ED provider, patient will be placed in next available bed.   Hinda Kehr, MD 01/09/21 [phone]

## 2021-01-09 NOTE — ED Triage Notes (Signed)
Pt arrived via [facility] from [facility], pt has been in floor x 90 mins after sliding out of bed. Pt has laceration and ecchymosis noted to R eye.   SB on monitor at 58, 190/78 97% RA,

## 2021-01-09 NOTE — ED Notes (Addendum)
Pt ambulated to restroom at this time and repositioned in bed.

## 2021-01-09 NOTE — ED Notes (Signed)
Pt able to stand and pivot to the restroom. Pt urinated and had a BM at this time. Pt placed in stretcher.

## 2021-01-09 NOTE — ED Notes (Signed)
Dr. Cheri Fowler made aware of pt's HTN at this time. See new orders.

## 2021-01-09 NOTE — ED Provider Notes (Signed)
[facility] Emergency Department Provider Note   ____________________________________________   Event Date/Time   First MD Initiated Contact with Patient 01/09/21 [phone]     (approximate)  I have reviewed the triage vital signs and the nursing notes.   HISTORY  Chief Complaint Fall    HPI Jodi Bullock is a 87 y.o. female who presents after a mechanical fall from her bed sliding onto the floor and resulting in right-sided facial trauma.  Patient has history of Alzheimer's dementia and cannot adequately participate in history and review of systems.  Patient does have bruising and bleeding to the right periorbital area.  Per report from staff, patient was found approximately 90 minutes after sliding from her bed onto the floor.  Patient is minimally ambulatory at baseline.  Patient does take Eliquis for persistent atrial fibrillation with last dose taken last night.          Past Medical History:  Diagnosis Date  . Arthritis   . Atrial flutter ([place])    a. Dx 07/2018. CHA2DS2VASc = 5-->Eliquis.  . Bladder incontinence   . Breast cancer ([place]) 1998  . Chronic heart failure with improved ejection fraction (HFimpEF) 01/10/2016   a. 09/2015 Echo: EF 40-45%, possible inf HK, mod PAH (PASP 47mHg); b. 11/2018 Echo: EF 60-65%, RVSP 625mg. Mod dil LA, mildly dil RA. Mod MR.  . Closed fracture of right femur (HCSpringdale   a. 08/2017->Managed conservatively.  . Marland KitchenOVID-19 virus infection    a. 05/2019 - asymptomatic.  . Marland Kitchenepression   . GERD (gastroesophageal reflux disease)   . History of kidney stones   . HLD (hyperlipidemia)   . Hypertension   . Neuropathy of both feet   . NICM (nonischemic cardiomyopathy) (HCPoole   a. 08/2015 Lexiscan MV: EF 39%, no ischemia; b. 09/2015 Echo: EF 40-45%; c. 11/2018 Echo: EF 60-65%.  . Persistent atrial fibrillation (HCEdge[place]  . Right Humerus head fracture    a. 07/2016 - managed conservatively.  . 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   [facility]     Patient Active Problem List   Diagnosis Date Noted  . 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 Atrophy of vagina 05/17/2020  . Chronic pain syndrome 05/17/2020  . Closed fracture pubis (HCGeronimo01/07/2020  . Constipation by delayed colonic transit 05/17/2020  . Contusion of unspecified part of head, initial encounter 05/17/2020  . COVID-19 05/17/2020  . Dysuria 05/17/2020  . Increased frequency of urination 05/17/2020  . Acute upper respiratory infection 05/17/2020  . Laceration without foreign body of left elbow, initial encounter 05/17/2020  . Macrocytosis 05/17/2020  . Major depression, single episode 05/17/2020  . Mixed simple and mucopurulent chronic bronchitis (HCNunez01/07/2020  . Ulcer of right foot (HCWillisburg01/07/2020  . Overactive bladder 05/17/2020  . Pain in limb 05/17/2020  . Atrial flutter (HCNewellton01/07/2020  . Peripheral edema 05/17/2020  . Pernicious anemia 05/17/2020  . Restless legs syndrome 05/17/2020  . Sciatica 05/17/2020  . Tinea unguium 05/17/2020  . Unspecified fracture of sacrum, initial encounter for closed fracture (HCTroy01/07/2020  . Vaginal candidiasis 05/17/2020  . Vitamin B12 deficiency anemia, unspecified 05/17/2020  . Weight loss 05/17/2020  . Microscopic hematuria 05/21/2019  . Femur fracture (HCTanacross04/28/2019  . Abnormal gait 12/19/2016  . Acquired spondylolisthesis 12/19/2016  . Breast lump 12/19/2016  . Cervical spondylosis without myelopathy 12/19/2016  . Closed Colles' fracture 12/19/2016  . Closed fracture of base of neck of femur (HCPort St. Lucie  12/19/2016  . Closed fracture of lower end of radius and ulna 12/19/2016  . Closed fracture of neck of femur ([place]) 12/19/2016  . Spondylolisthesis, congenital 12/19/2016  . Contracture of joint of hand 12/19/2016  . Contracture of wrist joint 12/19/2016  . Contusion of hand 12/19/2016  . Contusion of hip 12/19/2016  . Contusion of knee 12/19/2016  .  Degeneration of intervertebral disc at C4-C5 level 12/19/2016  . Degeneration of lumbar intervertebral disc 12/19/2016  . Disorder of coccyx 12/19/2016  . Dry eyes 12/19/2016  . Enthesopathy 12/19/2016  . Enthesopathy of hip region 12/19/2016  . Hand joint pain 12/19/2016  . Hand joint stiff 12/19/2016  . Hip pain 12/19/2016  . Joint pain 12/19/2016  . Knee pain 12/19/2016  . Localized, primary osteoarthritis 12/19/2016  . Loose body in hip joint 12/19/2016  . Lumbar sprain 12/19/2016  . Abnormal mammogram 12/19/2016  . Meibomian gland dysfunction 12/19/2016  . Muscle weakness 12/19/2016  . Open fracture of lower end of forearm 12/19/2016  . Presbyopia 12/19/2016  . Regular astigmatism 12/19/2016  . Shoulder joint pain 12/19/2016  . Skin sensation disturbance 12/19/2016  . Sprain of ankle 12/19/2016  . Trochanteric bursitis 12/19/2016  . Wrist joint pain 12/19/2016  . Stiffness of wrist joint 12/19/2016  . Acute bilateral low back pain without sciatica 09/29/2016  . Acute bronchitis 06/19/2016  . Recurrent productive cough 06/14/2016  . Medicare annual wellness visit, subsequent 05/17/2016  . Fall in elderly patient 01/27/2016  . Sprain of wrist, right 01/26/2016  . Systolic CHF with reduced left ventricular function, NYHA class 3 ([place]) 01/10/2016  . Productive cough 11/19/2015  . Chronic systolic heart failure ([place]) 10/12/2015  . Dyspnea on exertion 08/31/2015  . Fatigue 08/31/2015  . 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 Irregular heart rhythm 07/22/2015  . Left arm pain 06/29/2015  . Dyslipidemia 06/03/2015  . Acute recurrent maxillary sinusitis 06/03/2015  . Oral mucosal lesion 05/26/2015  . Urge incontinence 02/14/2015  . Ankle edema 02/02/2015  . At risk for falling 01/25/2015  . Dizziness 01/25/2015  . Acid reflux 01/25/2015  . Big thyroid 01/25/2015  . Gravida 2 para 2 01/25/2015  . Personal history of malignant neoplasm of breast 01/25/2015  . Arthritis,  degenerative 01/25/2015  . Parity 2 01/25/2015  . Peripheral blood vessel disorder ([place]) 01/25/2015  . Need for vaccination 01/25/2015  . Pain in rectum 01/25/2015  . Reflux 01/25/2015  . Screening for depression 01/25/2015  . Disease of accessory sinus 01/25/2015  . Headache, temporal 01/25/2015  . Primary cancer of thymus ([place]) 01/25/2015  . Disease of thyroid gland 01/25/2015  . Avitaminosis D 01/25/2015  . Family history of diabetes mellitus in father 12/31/2014  . Fasting hyperglycemia 12/31/2014  . Hypertension 12/01/2014  . HLD (hyperlipidemia) 12/01/2014  . Anxiety 12/01/2014  . Myofascial pain 12/01/2014  . Breast CA ([place]) 11/24/2014  . Absence of bladder continence 11/24/2014  . Urgency of micturation 11/24/2014  . Common bile duct dilatation 10/20/2014  . Pancreatic cyst 10/20/2014  . Kidney stones 10/20/2014  . Neuritis or radiculitis due to rupture of lumbar intervertebral disc 06/15/2014  . Lumbar canal stenosis 06/15/2014  . Degenerative arthritis of lumbar spine 06/15/2014  . Lumbar radiculitis 06/15/2014  . Osteoarthritis of spine with radiculopathy, lumbar region 06/15/2014  . 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 Bloodgood disease 11/05/2012  . Arthritis of temporomandibular joint 06/24/2012  . Atypical chest pain 01/05/2012  . Breath shortness 12/04/2011  . Lung mass  11/15/2011  . Thyroid nodule 11/15/2011  . Age-related macular degeneration, dry 10/03/2011  . Disorder of peripheral nervous system 10/03/2011  . Chronic rhinitis 03/03/2011  . Carotid artery narrowing 03/01/2011  . Polypharmacy 12/02/2010    Past Surgical History:  Procedure Laterality Date  . ABDOMINAL HYSTERECTOMY    . BREAST LUMPECTOMY    . CATARACT EXTRACTION    . CHOLECYSTECTOMY    . JOINT REPLACEMENT Right    Hip  . KNEE SURGERY Right     Prior to Admission medications   Medication Sig Start Date End Date Taking? Authorizing Provider  docusate sodium (COLACE) 100  MG capsule Take 100 mg by mouth at bedtime.    Yes [provider]  fluticasone (FLONASE) 50 MCG/ACT nasal spray Place 1 spray into both nostrils daily. 08/30/17  Yes Wilhelmina Mcardle, MD  furosemide (LASIX) 20 MG tablet Take 1-2 tablets (20-40 mg total) by mouth as directed. Take 2 tablets (40 mg) daily for weight greater than 140 pounds. When weight is less than 140 then take 1 tablet (20 mg) daily with an additional 20 mg as needed for weight gain overnight of 2 pounds. 11/18/20  Yes Theora Gianotti, NP  furosemide (LASIX) 40 MG tablet Take 40 mg by mouth daily.   Yes [provider]  gabapentin (NEURONTIN) 300 MG capsule Take 300 mg by mouth at bedtime.   Yes [provider]  loratadine (CLARITIN) 10 MG tablet Take 10 mg by mouth daily.   Yes [provider]  LORazepam (ATIVAN) 0.5 MG tablet Take 1 tablet (0.5 mg total) by mouth 2 (two) times daily as needed for anxiety. 05/23/17  Yes Roselee Nova, MD  losartan (COZAAR) 100 MG tablet Take 1 tablet (100 mg total) by mouth daily. 05/23/17  Yes Roselee Nova, MD  mirabegron ER (MYRBETRIQ) 25 MG TB24 tablet Take 25 mg by mouth daily.   Yes [provider]  sertraline (ZOLOFT) 25 MG tablet Take 25 mg by mouth daily.   Yes [provider]  traMADol (ULTRAM) 50 MG tablet Take 50 mg by mouth daily.    Yes [provider]  trimethoprim (TRIMPEX) 100 MG tablet Take 1 tablet (100 mg total) by mouth daily. 03/04/18  Yes MacDiarmid, Nicki Reaper, MD  acetaminophen (TYLENOL) 650 MG CR tablet Take 650 mg by mouth 3 (three) times daily.    [provider]  alum & mag hydroxide-simeth (MAALOX/MYLANTA) 200-200-20 MG/5ML suspension Take 30 mLs by mouth at bedtime as needed for indigestion or heartburn. Patient not taking: Reported on 11/18/2020    [provider]  carbamide peroxide (GLY-OXIDE) 10 % solution Place 1 application onto teeth 4 (four) times daily -  with meals and at  bedtime. (as needed for dry mouth) Patient not taking: Reported on 11/18/2020    [provider]  carvedilol (COREG) 6.25 MG tablet Take 1 tablet (6.25 mg total) by mouth 2 (two) times daily with a meal. Patient not taking: Reported on 01/09/2021 07/08/20   Wellington Hampshire, MD  Cholecalciferol 2000 UNITS TABS Take 2,000 Units by mouth daily.  Patient not taking: Reported on 01/09/2021    Roselee Nova, MD  conjugated estrogens (PREMARIN) vaginal cream Apply 0.54m (pea-sized amount)  just inside the vaginal introitus with a finger-tip on  Monday, Wednesday and Friday nights. Patient not taking: Reported on 11/18/2020 04/15/19   MZara CouncilA, PA-C  Cyanocobalamin 1000 MCG/ML KIT Inject 1,000 mcg as directed every  30 (thirty) days.  Patient not taking: Reported on 01/09/2021    [provider]  diclofenac sodium (VOLTAREN) 1 % GEL Apply 2 g topically 2 (two) times daily as needed (knee pain).    [provider]  doxycycline (VIBRA-TABS) 100 MG tablet Take 1 tablet (100 mg total) by mouth 2 (two) times daily. Patient not taking: Reported on 01/09/2021 10/07/20   Felipa Furnace, DPM  famotidine (PEPCID) 20 MG tablet Take 20 mg by mouth as needed for heartburn or indigestion. Patient not taking: Reported on 01/09/2021    [provider]  Fluticasone Furoate (ARNUITY ELLIPTA) 100 MCG/ACT AEPB Inhale 1 puff into the lungs daily. 08/30/17   Wilhelmina Mcardle, MD  Lidocaine-Glycerin (PREPARATION H EX) Apply topically daily as needed.    [provider]  loperamide (IMODIUM A-D) 2 MG tablet Take 2 mg by mouth as needed for diarrhea or loose stools (max 4 doses daily).  Patient not taking: Reported on 11/18/2020    [provider]  lovastatin (MEVACOR) 40 MG tablet Take 40 mg by mouth every Monday, Wednesday, and Friday.    [provider]  magnesium hydroxide (MILK OF MAGNESIA) 400 MG/5ML suspension Take 30 mLs by mouth at bedtime as needed for mild  constipation.    [provider]  neomycin-bacitracin-polymyxin (NEOSPORIN) [phone] ointment Apply topically 4 (four) times daily as needed (wound care).    [provider]  polyethylene glycol (MIRALAX / GLYCOLAX) 17 g packet Take 17 g by mouth daily.     [provider]  Saccharomyces boulardii (PROBIOTIC) 250 MG CAPS Take 1 capsule by mouth daily.  Patient not taking: No sig reported    [provider]  Sodium Phosphates (ENEMA) 7-19 GM/118ML ENEM Place rectally.    [provider]    Allergies 2,4-d dimethylamine (amisol); Celecoxib; Ciprofloxacin; Ibuprofen; Levamisole; Metronidazole; Naproxen; and Other  Family History  Problem Relation Age of Onset  . Stroke Mother   . Diabetes Father   . Heart disease Father   . Kidney disease Neg Hx   . Bladder Cancer Neg Hx   . Kidney cancer Neg Hx     Social History Social History   Tobacco Use  . Smoking status: Former  . Smokeless tobacco: Never  . Tobacco comments:    smoked in high school for a few years only  Vaping Use  . Vaping Use: Never used  Substance Use Topics  . Alcohol use: No    Alcohol/week: 0.0 standard drinks  . Drug use: No    Review of Systems Able to assess ____________________________________________   PHYSICAL EXAM:  VITAL SIGNS: ED Triage Vitals  Enc Vitals Group     BP 01/09/21 0617 (!) 192/86     Pulse Rate 01/09/21 0617 (!) 52     Resp 01/09/21 0617 18     Temp 01/09/21 0617 98 F (36.7 C)     Temp Source 01/09/21 0617 Oral     SpO2 01/09/21 0617 94 %     Weight 01/09/21 0607 135 lb (61.2 kg)     Height 01/09/21 0607 '5\' 4"'  (1.626 m)     Head Circumference --      Peak Flow --      Pain Score --      Pain Loc --      Pain Edu? --      Excl. in [place]? --    Constitutional: Alert and oriented only to person. Well appearing and  in no acute distress. Eyes: Conjunctivae are normal. PERRL. Head: Ecchymosis and oozing bleeding to the right  periorbital region Nose: No congestion/rhinnorhea. Mouth/Throat: Mucous membranes are moist. Neck: No stridor Cardiovascular: Grossly normal heart sounds.  Good peripheral circulation. Respiratory: Normal respiratory effort.  No retractions. Gastrointestinal: Soft and nontender. No distention. Musculoskeletal: No obvious deformities Neurologic:  Normal speech and language. No gross focal neurologic deficits are appreciated. Skin:  Skin is warm and dry. No rash noted. Psychiatric: Mood and affect are normal. Speech and behavior are normal.  ____________________________________________   LABS (all labs ordered are listed, but only abnormal results are displayed)  Labs Reviewed  BASIC METABOLIC PANEL - Abnormal; Notable for the following components:      Result Value   Glucose, Bld 116 (*)    GFR, Estimated 58 (*)    All other components within normal limits  APTT - Abnormal; Notable for the following components:   aPTT 43 (*)    All other components within normal limits  PROTIME-INR - Abnormal; Notable for the following components:   Prothrombin Time 17.0 (*)    INR 1.4 (*)    All other components within normal limits  CBC - Abnormal; Notable for the following components:   Hemoglobin 15.8 (*)    HCT 46.5 (*)    Platelets 140 (*)    All other components within normal limits  TYPE AND SCREEN   ____________________________________________  RADIOLOGY  ED MD interpretation: CT of the head without contrast shows a's small focus of subarachnoid hemorrhage overlying the left posterior parietal lobe and posterior temporal lobe  CT of the cervical spine does not show any evidence of acute abnormalities including no dislocations or fractures  Official radiology report(s): CT HEAD WO CONTRAST (5MM)  Result Date: 01/09/2021 CLINICAL DATA:  Stroke follow-up. EXAM: CT HEAD WITHOUT CONTRAST TECHNIQUE: Contiguous axial images were obtained from the base of the skull through the vertex  without intravenous contrast. COMPARISON:  Same day. FINDINGS: Brain: Stable small subarachnoid hemorrhage is noted in the left posterior parietal cortex. No mass effect or midline shift is noted. Ventricular size is within normal limits. No new hemorrhage is noted. No acute infarction is noted. Chronic ischemic white matter disease is again noted. Vascular: No hyperdense vessel or unexpected calcification. Skull: Normal. Negative for fracture or focal lesion. Sinuses/Orbits: No acute finding. Other: Right periorbital hematoma noted on prior exam is now significantly enlarged. IMPRESSION: Stable small subarachnoid hemorrhage is noted in the left posterior parietal cortex. Right periorbital scalp hematoma is significantly enlarged compared to prior exam. Electronically Signed   By: Marijo Conception M.D.   On: 01/09/2021 14:30   CT HEAD WO CONTRAST (5MM)  Addendum Date: 01/09/2021   ADDENDUM REPORT: 01/09/2021 07:25 ADDENDUM: Critical Value/emergent results were called by telephone at the time of interpretation on 01/09/2021 at 7:25 am to provider [facility] , who verbally acknowledged these results. Electronically Signed   By: Kerby Moors M.D.   On: 01/09/2021 07:25   Result Date: 01/09/2021 CLINICAL DATA:  Head trauma. Laceration in ecchymosis noted to the right eye. EXAM: CT HEAD WITHOUT CONTRAST CT CERVICAL SPINE WITHOUT CONTRAST TECHNIQUE: Multidetector CT imaging of the head and cervical spine was performed following the standard protocol without intravenous contrast. Multiplanar CT image reconstructions of the cervical spine were also generated. COMPARISON:  10/25/2020 FINDINGS: CT HEAD FINDINGS Brain: There is a small focus of subarachnoid hemorrhage overlying the left posterior parietal lobe and temporal lobe, images 12/2 through  16/2. No additional areas of acute hemorrhage identified. No mass effect or midline shift. There is mild diffuse low-attenuation within the subcortical and periventricular  white matter compatible with chronic microvascular disease. Prominence of the sulci and ventricles compatible with brain atrophy. Vascular: No hyperdense vessel or unexpected calcification. Skull: Normal. Negative for fracture or focal lesion. Sinuses/Orbits: Chronic mucosal thickening involving the paranasal sinuses. Status post bilateral median antrectomy. Mastoid air cells are clear. Other: Large right periorbital hematoma measures 1.5 x 4.5 cm, image 17/3. CT CERVICAL SPINE FINDINGS Alignment: Normal. Skull base and vertebrae: The vertebral body heights are well preserved. The facet joints are aligned. Soft tissues and spinal canal: No prevertebral fluid or swelling. No visible canal hematoma. Disc levels: Multi level disc space narrowing and mild endplate spurring is identified. This is most advanced at C6-7. Upper chest: Negative. Other: Extensive aortic atherosclerotic calcifications and calcifications of the carotid arteries. IMPRESSION: 1. Small focus of subarachnoid hemorrhage overlies the left posterior parietal lobe and posterior temporal lobe. 2. Chronic small vessel ischemic change and brain atrophy. 3. Large right periorbital hematoma. 4. No evidence for cervical spine fracture or dislocation. 5. Cervical spondylosis. 6. Aortic atherosclerosis. Aortic Atherosclerosis (ICD10-I70.0). Electronically Signed: By: Kerby Moors M.D. On: 01/09/2021 07:23   CT Cervical Spine Wo Contrast  Addendum Date: 01/09/2021   ADDENDUM REPORT: 01/09/2021 07:25 ADDENDUM: Critical Value/emergent results were called by telephone at the time of interpretation on 01/09/2021 at 7:25 am to provider [facility] , who verbally acknowledged these results. Electronically Signed   By: Kerby Moors M.D.   On: 01/09/2021 07:25   Result Date: 01/09/2021 CLINICAL DATA:  Head trauma. Laceration in ecchymosis noted to the right eye. EXAM: CT HEAD WITHOUT CONTRAST CT CERVICAL SPINE WITHOUT CONTRAST TECHNIQUE: Multidetector CT  imaging of the head and cervical spine was performed following the standard protocol without intravenous contrast. Multiplanar CT image reconstructions of the cervical spine were also generated. COMPARISON:  10/25/2020 FINDINGS: CT HEAD FINDINGS Brain: There is a small focus of subarachnoid hemorrhage overlying the left posterior parietal lobe and temporal lobe, images 12/2 through 16/2. No additional areas of acute hemorrhage identified. No mass effect or midline shift. There is mild diffuse low-attenuation within the subcortical and periventricular white matter compatible with chronic microvascular disease. Prominence of the sulci and ventricles compatible with brain atrophy. Vascular: No hyperdense vessel or unexpected calcification. Skull: Normal. Negative for fracture or focal lesion. Sinuses/Orbits: Chronic mucosal thickening involving the paranasal sinuses. Status post bilateral median antrectomy. Mastoid air cells are clear. Other: Large right periorbital hematoma measures 1.5 x 4.5 cm, image 17/3. CT CERVICAL SPINE FINDINGS Alignment: Normal. Skull base and vertebrae: The vertebral body heights are well preserved. The facet joints are aligned. Soft tissues and spinal canal: No prevertebral fluid or swelling. No visible canal hematoma. Disc levels: Multi level disc space narrowing and mild endplate spurring is identified. This is most advanced at C6-7. Upper chest: Negative. Other: Extensive aortic atherosclerotic calcifications and calcifications of the carotid arteries. IMPRESSION: 1. Small focus of subarachnoid hemorrhage overlies the left posterior parietal lobe and posterior temporal lobe. 2. Chronic small vessel ischemic change and brain atrophy. 3. Large right periorbital hematoma. 4. No evidence for cervical spine fracture or dislocation. 5. Cervical spondylosis. 6. Aortic atherosclerosis. Aortic Atherosclerosis (ICD10-I70.0). Electronically Signed: By: Kerby Moors M.D. On: 01/09/2021 07:23     ____________________________________________   PROCEDURES  Procedure(s) performed (including Critical Care):  .1-3 Lead EKG Interpretation  Date/Time: 01/09/2021 3:53 PM Performed  by: Naaman Plummer, MD Authorized by: Naaman Plummer, MD     Interpretation: normal     ECG rate:  64   ECG rate assessment: normal     Rhythm: sinus rhythm     Ectopy: none     Conduction: normal   ..Laceration Repair  Date/Time: 01/09/2021 3:53 PM Performed by: Naaman Plummer, MD Authorized by: Naaman Plummer, MD   Consent:    Consent obtained:  Verbal   Consent given by:  Patient   Risks, benefits, and alternatives were discussed: yes     Risks discussed:  Infection and poor cosmetic result   Alternatives discussed:  No treatment and delayed treatment Universal protocol:    Immediately prior to procedure, a time out was called: yes     Patient identity confirmed:  Verbally with patient Anesthesia:    Anesthesia method:  None Laceration details:    Location:  Face   Face location:  L eyebrow   Length (cm):  1.5   Depth (mm):  3 Pre-procedure details:    Preparation:  Patient was prepped and draped in usual sterile fashion and imaging obtained to evaluate for foreign bodies Exploration:    Limited defect created (wound extended): no     Wound exploration: entire depth of wound visualized     Contaminated: no   Treatment:    Area cleansed with:  Povidone-iodine and saline   Amount of cleaning:  Standard   Irrigation solution:  Sterile saline   Irrigation volume:  200   Irrigation method:  Pressure wash   Visualized foreign bodies/material removed: no     Debridement:  None   Undermining:  None   Scar revision: no   Skin repair:    Repair method:  Tissue adhesive Approximation:    Approximation:  Close Repair type:    Repair type:  Simple Post-procedure details:    Dressing:  Open (no dressing)   ____________________________________________   INITIAL IMPRESSION /  ASSESSMENT AND PLAN / ED COURSE  As part of my medical decision making, I reviewed the following data within the electronic medical record, if available:  Nursing notes reviewed and incorporated, Labs reviewed, EKG interpreted, Old chart reviewed, Radiograph reviewed and Notes from prior ED visits reviewed and incorporated     - neurological deficits from baseline + elevated blood pressures to 192/86 Likely traumatic subarachnoid Unlikely infectious etiology or changes secondary to ingestion  Workup: POCT glucose. CBC, BMP, PT/INR, PTT, troponin, type and screen ECG and non-contrast head CT  Findings: CT Brain: intracranial hemorrhage in the left parietal and temporal region ECG: No cerebral T waves. No STEMI  Interventions: BP Control: PRN Nicardipine 60m/hr by slow infusion (587mhr) titrating to maximum of 3045mr with permissive hypertension (SBP goal 180)  Consults: Neurosurgery-recommends holding Eliquis and repeat head CT in 4 hours.  Recommend discharge with Eliquis held if no change at 4 hours  Reassess: 1445 Repeat head CT shows no increase in subarachnoid hemorrhage in any location and is therefore stable for discharge at this time.  Laceration repaired at bedside.  Please see laceration repair note for full details.  Patient provided with follow-up information to contact neurosurgery within the next 3-5 days for follow-up.  Caretaker at bedside understands plan of care and all questions were answered  Dispo: Discharge to assisted living facility      ____________________________________________   FINAL CLINICAL IMPRESSION(S) / ED DIAGNOSES  Final diagnoses:  Fall, initial encounter  Contusion of face,  initial encounter  SAH (subarachnoid hemorrhage) [facility])     ED Discharge Orders     None        Note:  This document was prepared using Dragon voice recognition software and may include unintentional dictation errors.    Naaman Plummer, MD 01/09/21  [phone]

## 2021-01-09 NOTE — Discharge Instructions (Addendum)
Please hold your Eliquis until you follow-up with the neurosurgery team

## 2021-01-09 NOTE — ED Notes (Addendum)
Pt ambulatory to the restroom at this time. Pt repositioned in the bed at this time.

## 2021-01-09 NOTE — ED Notes (Addendum)
Pt presents to the ED from [facility]. Pt had a fall this AM, pt states that she slide out of the bed and hit her head on floor. Pt states she was on the floor for an hour or so. Pt is on Eliquis. Pt has a hematoma to the R aspect of her R eye. Pt is A&Ox3 and NAD. Dr. Cheri Fowler at bedside at this time.

## 2021-01-13 ENCOUNTER — Ambulatory Visit: Payer: Medicare Other | Admitting: Podiatry

## 2021-01-24 ENCOUNTER — Emergency Department: Payer: Medicare Other

## 2021-01-24 ENCOUNTER — Inpatient Hospital Stay
Admission: EM | Admit: 2021-01-24 | Discharge: 2021-01-27 | DRG: 689 | Disposition: A | Discharge status: Hospice | Payer: Medicare Other | Attending: Student | Admitting: Student

## 2021-01-24 ENCOUNTER — Other Ambulatory Visit: Payer: Self-pay

## 2021-01-24 DIAGNOSIS — N1831 Chronic kidney disease, stage 3a: Secondary | ICD-10-CM | POA: Diagnosis present

## 2021-01-24 DIAGNOSIS — N12 Tubulo-interstitial nephritis, not specified as acute or chronic: Secondary | ICD-10-CM

## 2021-01-24 DIAGNOSIS — M545 Low back pain, unspecified: Secondary | ICD-10-CM | POA: Diagnosis present

## 2021-01-24 DIAGNOSIS — R627 Adult failure to thrive: Secondary | ICD-10-CM | POA: Diagnosis present

## 2021-01-24 DIAGNOSIS — G9341 Metabolic encephalopathy: Secondary | ICD-10-CM | POA: Diagnosis not present

## 2021-01-24 DIAGNOSIS — E785 Hyperlipidemia, unspecified: Secondary | ICD-10-CM | POA: Diagnosis present

## 2021-01-24 DIAGNOSIS — R296 Repeated falls: Secondary | ICD-10-CM | POA: Diagnosis present

## 2021-01-24 DIAGNOSIS — N10 Acute tubulo-interstitial nephritis: Secondary | ICD-10-CM | POA: Diagnosis not present

## 2021-01-24 DIAGNOSIS — Z853 Personal history of malignant neoplasm of breast: Secondary | ICD-10-CM

## 2021-01-24 DIAGNOSIS — Z90710 Acquired absence of both cervix and uterus: Secondary | ICD-10-CM

## 2021-01-24 DIAGNOSIS — R269 Unspecified abnormalities of gait and mobility: Secondary | ICD-10-CM | POA: Diagnosis present

## 2021-01-24 DIAGNOSIS — R5381 Other malaise: Secondary | ICD-10-CM | POA: Diagnosis present

## 2021-01-24 DIAGNOSIS — R911 Solitary pulmonary nodule: Secondary | ICD-10-CM | POA: Diagnosis present

## 2021-01-24 DIAGNOSIS — Z881 Allergy status to other antibiotic agents status: Secondary | ICD-10-CM

## 2021-01-24 DIAGNOSIS — Z79899 Other long term (current) drug therapy: Secondary | ICD-10-CM

## 2021-01-24 DIAGNOSIS — Z6823 Body mass index (BMI) 23.0-23.9, adult: Secondary | ICD-10-CM

## 2021-01-24 DIAGNOSIS — M1990 Unspecified osteoarthritis, unspecified site: Secondary | ICD-10-CM | POA: Diagnosis present

## 2021-01-24 DIAGNOSIS — H353190 Nonexudative age-related macular degeneration, unspecified eye, stage unspecified: Secondary | ICD-10-CM | POA: Diagnosis present

## 2021-01-24 DIAGNOSIS — Z96641 Presence of right artificial hip joint: Secondary | ICD-10-CM | POA: Diagnosis present

## 2021-01-24 DIAGNOSIS — T1490XA Injury, unspecified, initial encounter: Secondary | ICD-10-CM

## 2021-01-24 DIAGNOSIS — R918 Other nonspecific abnormal finding of lung field: Secondary | ICD-10-CM | POA: Diagnosis present

## 2021-01-24 DIAGNOSIS — M25473 Effusion, unspecified ankle: Secondary | ICD-10-CM | POA: Diagnosis present

## 2021-01-24 DIAGNOSIS — Z8249 Family history of ischemic heart disease and other diseases of the circulatory system: Secondary | ICD-10-CM

## 2021-01-24 DIAGNOSIS — I4892 Unspecified atrial flutter: Secondary | ICD-10-CM | POA: Diagnosis present

## 2021-01-24 DIAGNOSIS — I4821 Permanent atrial fibrillation: Secondary | ICD-10-CM | POA: Diagnosis present

## 2021-01-24 DIAGNOSIS — I5020 Unspecified systolic (congestive) heart failure: Secondary | ICD-10-CM | POA: Diagnosis present

## 2021-01-24 DIAGNOSIS — F0391 Unspecified dementia with behavioral disturbance: Secondary | ICD-10-CM | POA: Diagnosis present

## 2021-01-24 DIAGNOSIS — E43 Unspecified severe protein-calorie malnutrition: Secondary | ICD-10-CM | POA: Diagnosis present

## 2021-01-24 DIAGNOSIS — Z7901 Long term (current) use of anticoagulants: Secondary | ICD-10-CM

## 2021-01-24 DIAGNOSIS — Z87442 Personal history of urinary calculi: Secondary | ICD-10-CM

## 2021-01-24 DIAGNOSIS — F32A Depression, unspecified: Secondary | ICD-10-CM | POA: Diagnosis present

## 2021-01-24 DIAGNOSIS — G894 Chronic pain syndrome: Secondary | ICD-10-CM | POA: Diagnosis present

## 2021-01-24 DIAGNOSIS — I130 Hypertensive heart and chronic kidney disease with heart failure and stage 1 through stage 4 chronic kidney disease, or unspecified chronic kidney disease: Secondary | ICD-10-CM | POA: Diagnosis present

## 2021-01-24 DIAGNOSIS — G2581 Restless legs syndrome: Secondary | ICD-10-CM | POA: Diagnosis present

## 2021-01-24 DIAGNOSIS — K219 Gastro-esophageal reflux disease without esophagitis: Secondary | ICD-10-CM | POA: Diagnosis present

## 2021-01-24 DIAGNOSIS — Z66 Do not resuscitate: Secondary | ICD-10-CM | POA: Diagnosis present

## 2021-01-24 DIAGNOSIS — Z9049 Acquired absence of other specified parts of digestive tract: Secondary | ICD-10-CM

## 2021-01-24 DIAGNOSIS — Z8616 Personal history of COVID-19: Secondary | ICD-10-CM

## 2021-01-24 DIAGNOSIS — M5030 Other cervical disc degeneration, unspecified cervical region: Secondary | ICD-10-CM | POA: Diagnosis present

## 2021-01-24 DIAGNOSIS — R531 Weakness: Secondary | ICD-10-CM

## 2021-01-24 DIAGNOSIS — N179 Acute kidney failure, unspecified: Secondary | ICD-10-CM | POA: Diagnosis present

## 2021-01-24 DIAGNOSIS — Z85238 Personal history of other malignant neoplasm of thymus: Secondary | ICD-10-CM

## 2021-01-24 DIAGNOSIS — Z888 Allergy status to other drugs, medicaments and biological substances status: Secondary | ICD-10-CM

## 2021-01-24 DIAGNOSIS — R58 Hemorrhage, not elsewhere classified: Secondary | ICD-10-CM | POA: Diagnosis present

## 2021-01-24 DIAGNOSIS — Z515 Encounter for palliative care: Secondary | ICD-10-CM

## 2021-01-24 DIAGNOSIS — M47812 Spondylosis without myelopathy or radiculopathy, cervical region: Secondary | ICD-10-CM | POA: Diagnosis present

## 2021-01-24 DIAGNOSIS — I2510 Atherosclerotic heart disease of native coronary artery without angina pectoris: Secondary | ICD-10-CM | POA: Diagnosis present

## 2021-01-24 DIAGNOSIS — M5136 Other intervertebral disc degeneration, lumbar region: Secondary | ICD-10-CM | POA: Diagnosis present

## 2021-01-24 DIAGNOSIS — Z87891 Personal history of nicotine dependence: Secondary | ICD-10-CM

## 2021-01-24 DIAGNOSIS — I10 Essential (primary) hypertension: Secondary | ICD-10-CM | POA: Diagnosis present

## 2021-01-24 DIAGNOSIS — F419 Anxiety disorder, unspecified: Secondary | ICD-10-CM | POA: Diagnosis present

## 2021-01-24 DIAGNOSIS — D519 Vitamin B12 deficiency anemia, unspecified: Secondary | ICD-10-CM | POA: Diagnosis present

## 2021-01-24 LAB — BASIC METABOLIC PANEL
Anion gap: 11 (ref 5–15)
BUN: 26 mg/dL — ABNORMAL HIGH (ref 8–23)
CO2: 27 mmol/L (ref 22–32)
Calcium: 9.3 mg/dL (ref 8.9–10.3)
Chloride: 97 mmol/L — ABNORMAL LOW (ref 98–111)
Creatinine, Ser: 1.53 mg/dL — ABNORMAL HIGH (ref 0.44–1.00)
GFR, Estimated: 32 mL/min — ABNORMAL LOW (ref >60)
Glucose, Bld: 144 mg/dL — ABNORMAL HIGH (ref 70–99)
Potassium: 4.4 mmol/L (ref 3.5–5.1)
Sodium: 135 mmol/L (ref 135–145)

## 2021-01-24 LAB — URINALYSIS, COMPLETE (UACMP) WITH MICROSCOPIC
Bilirubin Urine: NEGATIVE
Glucose, UA: NEGATIVE mg/dL
Hgb urine dipstick: NEGATIVE
Ketones, ur: 5 mg/dL — AB
Nitrite: NEGATIVE
Protein, ur: >=300 mg/dL — AB
Specific Gravity, Urine: 1.033 — ABNORMAL HIGH (ref 1.005–1.030)
WBC, UA: >50 WBC/hpf — ABNORMAL HIGH (ref 0–5)
pH: 5 (ref 5.0–8.0)

## 2021-01-24 LAB — CBC
HCT: 46.1 % — ABNORMAL HIGH (ref 36.0–46.0)
Hemoglobin: 15.5 g/dL — ABNORMAL HIGH (ref 12.0–15.0)
MCH: 32.2 pg (ref 26.0–34.0)
MCHC: 33.6 g/dL (ref 30.0–36.0)
MCV: 95.6 fL (ref 80.0–100.0)
Platelets: 147 10*3/uL — ABNORMAL LOW (ref 150–400)
RBC: 4.82 MIL/uL (ref 3.87–5.11)
RDW: 12.7 % (ref 11.5–15.5)
WBC: 22.3 10*3/uL — ABNORMAL HIGH (ref 4.0–10.5)
nRBC: 0 % (ref 0.0–0.2)

## 2021-01-24 LAB — AMMONIA: Ammonia: <9 umol/L — ABNORMAL LOW (ref 9–35)

## 2021-01-24 MED ORDER — HYDRALAZINE HCL 20 MG/ML IJ SOLN: 5.0000 mg | Freq: Four times a day (QID) | INTRAMUSCULAR | Status: DC | PRN

## 2021-01-24 MED ORDER — ONDANSETRON HCL 4 MG PO TABS
4.0000 mg | ORAL_TABLET | Freq: Four times a day (QID) | ORAL | Status: DC | PRN
Start: 2021-01-24 — End: 2021-01-27

## 2021-01-24 MED ORDER — ACETAMINOPHEN 650 MG RE SUPP: 650.0000 mg | Freq: Four times a day (QID) | RECTAL | Status: AC | PRN

## 2021-01-24 MED ORDER — SODIUM CHLORIDE 0.9 % IV SOLN
2.0000 g | INTRAVENOUS | Status: DC
  Administered 2021-01-25 – 2021-01-27 (×3): 2 g via INTRAVENOUS
  Filled 2021-01-24 (×2): qty 2
  Filled 2021-01-24 (×2): qty 20
  Filled 2021-01-24: qty 2

## 2021-01-24 MED ORDER — ONDANSETRON HCL 4 MG/2ML IJ SOLN
4.0000 mg | Freq: Four times a day (QID) | INTRAMUSCULAR | Status: DC | PRN
Start: 2021-01-24 — End: 2021-01-27

## 2021-01-24 MED ORDER — HEPARIN SODIUM (PORCINE) 5000 UNIT/ML IJ SOLN
5000.0000 [IU] | Freq: Three times a day (TID) | INTRAMUSCULAR | Status: DC
  Administered 2021-01-24 (×2): 5000 [IU] via SUBCUTANEOUS
  Filled 2021-01-24 (×2): qty 1

## 2021-01-24 MED ORDER — SODIUM CHLORIDE 0.9 % IV BOLUS
500.0000 mL | Freq: Once | INTRAVENOUS | Status: AC
  Administered 2021-01-24: 500 mL via INTRAVENOUS

## 2021-01-24 MED ORDER — HALOPERIDOL LACTATE 5 MG/ML IJ SOLN
2.0000 mg | Freq: Once | INTRAMUSCULAR | Status: AC
  Administered 2021-01-24: 2 mg via INTRAVENOUS
  Filled 2021-01-24: qty 1

## 2021-01-24 MED ORDER — SODIUM CHLORIDE 0.9 % IV SOLN
2.0000 g | Freq: Once | INTRAVENOUS | Status: AC
  Administered 2021-01-24: 2 g via INTRAVENOUS
  Filled 2021-01-24: qty 20

## 2021-01-24 MED ORDER — ACETAMINOPHEN 325 MG PO TABS: 650.0000 mg | ORAL_TABLET | Freq: Four times a day (QID) | ORAL | Status: AC | PRN

## 2021-01-24 NOTE — ED Triage Notes (Signed)
See first nurse note. Pt cannot states why she is here. Bruising noted to nose and around eyes. Pt states does not know why she is here and she has fallen but not today. Oriented to person and year. Has to be asked questions multiple times. Per EMS unknown if pt at baseline. + blood thinners

## 2021-01-24 NOTE — ED Provider Notes (Signed)
[facility] Emergency Department Provider Note  ____________________________________________  Time seen: Approximately 1:35 PM  I have reviewed the triage vital signs and the nursing notes.   HISTORY  Chief Complaint Fall and Altered Mental Status    Level 5 Caveat: Portions of the History and Physical including HPI and review of systems are unable to be completely obtained due to patient being a poor historian and confused  HPI Jodi Bullock is a 87 y.o. female with a history of atrial fibrillation, heart failure, hypertension who was brought to the ED due to confusion and decreased alertness.  She has had 2 or 3 falls over the last 2 weeks.  Patient reports pain in the upper abdomen and face.  Denies neck pain or paresthesias or motor weakness.    Past Medical History:  Diagnosis Date  . Arthritis   . Atrial flutter ([place])    a. Dx 07/2018. CHA2DS2VASc = 5-->Eliquis.  . Bladder incontinence   . Breast cancer ([place]) 1998  . Chronic heart failure with improved ejection fraction (HFimpEF) 01/10/2016   a. 09/2015 Echo: EF 40-45%, possible inf HK, mod PAH (PASP 7mHg); b. 11/2018 Echo: EF 60-65%, RVSP 682mg. Mod dil LA, mildly dil RA. Mod MR.  . Closed fracture of right femur (HCOdin   a. 08/2017->Managed conservatively.  . Marland KitchenOVID-19 virus infection    a. 05/2019 - asymptomatic.  . Marland Kitchenepression   . GERD (gastroesophageal reflux disease)   . History of kidney stones   . HLD (hyperlipidemia)   . Hypertension   . Neuropathy of both feet   . NICM (nonischemic cardiomyopathy) ([place]   a. 08/2015 Lexiscan MV: EF 39%, no ischemia; b. 09/2015 Echo: EF 40-45%; c. 11/2018 Echo: EF 60-65%.  . Persistent atrial fibrillation (HCP[place]  . Right Humerus head fracture    a. 07/2016 - managed conservatively.  . 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     Patient Active Problem List   Diagnosis Date Noted  . 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 Atrophy of vagina 05/17/2020  . Chronic pain syndrome 05/17/2020  . Closed fracture pubis ([facility]fields01/07/2020  . Constipation by delayed colonic transit 05/17/2020  . Contusion of unspecified part of head, initial encounter 05/17/2020  . COVID-19 05/17/2020  . Dysuria 05/17/2020  . Increased frequency of urination 05/17/2020  . Acute upper respiratory infection 05/17/2020  . Laceration without foreign body of left elbow, initial encounter 05/17/2020  . Macrocytosis 05/17/2020  . Major depression, single episode 05/17/2020  . Mixed simple and mucopurulent chronic bronchitis (HCL[place]isure [place] East01/07/2020  . Ulcer of right foot (HC[place]01/07/2020  . Overactive bladder 05/17/2020  . Pain in limb 05/17/2020  . Atrial flutter (HCHines[place]01/07/2020  . Peripheral edema 05/17/2020  . Pernicious anemia 05/17/2020  . Restless legs syndrome 05/17/2020  . Sciatica 05/17/2020  . Tinea unguium 05/17/2020  . Unspecified fracture of sacrum, initial encounter for closed fracture (HCSomers[place]01/07/2020  . Vaginal candidiasis 05/17/2020  . Vitamin B12 deficiency anemia, unspecified 05/17/2020  . Weight loss 05/17/2020  . Microscopic hematuria 05/21/2019  . Femur fracture (HCLimestone04/28/2019  . Abnormal gait 12/19/2016  . Acquired spondylolisthesis 12/19/2016  . Breast lump 12/19/2016  . Cervical spondylosis without myelopathy 12/19/2016  . Closed Colles' fracture 12/19/2016  . Closed fracture of base of neck of femur (HCCort[place]08/11/2016  . Closed fracture of lower end of radius and ulna 12/19/2016  . Closed fracture of neck of femur (  [place]) 12/19/2016  . Spondylolisthesis, congenital 12/19/2016  . Contracture of joint of hand 12/19/2016  . Contracture of wrist joint 12/19/2016  . Contusion of hand 12/19/2016  . Contusion of hip 12/19/2016  . Contusion of knee 12/19/2016  . Degeneration of intervertebral disc at C4-C5 level 12/19/2016  . Degeneration of lumbar intervertebral disc 12/19/2016  . Disorder of coccyx  12/19/2016  . Dry eyes 12/19/2016  . Enthesopathy 12/19/2016  . Enthesopathy of hip region 12/19/2016  . Hand joint pain 12/19/2016  . Hand joint stiff 12/19/2016  . Hip pain 12/19/2016  . Joint pain 12/19/2016  . Knee pain 12/19/2016  . Localized, primary osteoarthritis 12/19/2016  . Loose body in hip joint 12/19/2016  . Lumbar sprain 12/19/2016  . Abnormal mammogram 12/19/2016  . Meibomian gland dysfunction 12/19/2016  . Muscle weakness 12/19/2016  . Open fracture of lower end of forearm 12/19/2016  . Presbyopia 12/19/2016  . Regular astigmatism 12/19/2016  . Shoulder joint pain 12/19/2016  . Skin sensation disturbance 12/19/2016  . Sprain of ankle 12/19/2016  . Trochanteric bursitis 12/19/2016  . Wrist joint pain 12/19/2016  . Stiffness of wrist joint 12/19/2016  . Acute bilateral low back pain without sciatica 09/29/2016  . Acute bronchitis 06/19/2016  . Recurrent productive cough 06/14/2016  . Medicare annual wellness visit, subsequent 05/17/2016  . Fall in elderly patient 01/27/2016  . Sprain of wrist, right 01/26/2016  . Systolic CHF with reduced left ventricular function, NYHA class 3 ([place]) 01/10/2016  . Productive cough 11/19/2015  . Chronic systolic heart failure ([place]) 10/12/2015  . Dyspnea on exertion 08/31/2015  . Fatigue 08/31/2015  . 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 Irregular heart rhythm 07/22/2015  . Left arm pain 06/29/2015  . Dyslipidemia 06/03/2015  . Acute recurrent maxillary sinusitis 06/03/2015  . Oral mucosal lesion 05/26/2015  . Urge incontinence 02/14/2015  . Ankle edema 02/02/2015  . At risk for falling 01/25/2015  . Dizziness 01/25/2015  . Acid reflux 01/25/2015  . Big thyroid 01/25/2015  . Gravida 2 para 2 01/25/2015  . Personal history of malignant neoplasm of breast 01/25/2015  . Arthritis, degenerative 01/25/2015  . Parity 2 01/25/2015  . Peripheral blood vessel disorder ([place]) 01/25/2015  . Need for vaccination 01/25/2015  . Pain  in rectum 01/25/2015  . Reflux 01/25/2015  . Screening for depression 01/25/2015  . Disease of accessory sinus 01/25/2015  . Headache, temporal 01/25/2015  . Primary cancer of thymus ([place]) 01/25/2015  . Disease of thyroid gland 01/25/2015  . Avitaminosis D 01/25/2015  . Family history of diabetes mellitus in father 12/31/2014  . Fasting hyperglycemia 12/31/2014  . Hypertension 12/01/2014  . HLD (hyperlipidemia) 12/01/2014  . Anxiety 12/01/2014  . Myofascial pain 12/01/2014  . Breast CA ([place]) 11/24/2014  . Absence of bladder continence 11/24/2014  . Urgency of micturation 11/24/2014  . Common bile duct dilatation 10/20/2014  . Pancreatic cyst 10/20/2014  . Kidney stones 10/20/2014  . Neuritis or radiculitis due to rupture of lumbar intervertebral disc 06/15/2014  . Lumbar canal stenosis 06/15/2014  . Degenerative arthritis of lumbar spine 06/15/2014  . Lumbar radiculitis 06/15/2014  . Osteoarthritis of spine with radiculopathy, lumbar region 06/15/2014  . 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 Bloodgood disease 11/05/2012  . Arthritis of temporomandibular joint 06/24/2012  . Atypical chest pain 01/05/2012  . Breath shortness 12/04/2011  . Lung mass 11/15/2011  . Thyroid nodule 11/15/2011  . Age-related macular degeneration, dry 10/03/2011  . Disorder of peripheral nervous system 10/03/2011  .  Chronic rhinitis 03/03/2011  . Carotid artery narrowing 03/01/2011  . Polypharmacy 12/02/2010     Past Surgical History:  Procedure Laterality Date  . ABDOMINAL HYSTERECTOMY    . BREAST LUMPECTOMY    . CATARACT EXTRACTION    . CHOLECYSTECTOMY    . JOINT REPLACEMENT Right    Hip  . KNEE SURGERY Right      Prior to Admission medications   Medication Sig Start Date End Date Taking? Authorizing Provider  acetaminophen (TYLENOL) 650 MG CR tablet Take 650 mg by mouth 3 (three) times daily.   Yes [provider]  apixaban (ELIQUIS) 5 MG TABS tablet Take 5 mg by mouth  2 (two) times daily.   Yes [provider]  carvedilol (COREG) 6.25 MG tablet Take 1 tablet (6.25 mg total) by mouth 2 (two) times daily with a meal. 07/08/20  Yes Wellington Hampshire, MD  Cholecalciferol 2000 UNITS TABS Take 2,000 Units by mouth daily.   Yes Keith Rake Asad A, MD  diclofenac sodium (VOLTAREN) 1 % GEL Apply 2 g topically 2 (two) times daily as needed (knee pain).   Yes [provider]  docusate sodium (COLACE) 100 MG capsule Take 100 mg by mouth at bedtime.    Yes [provider]  fluticasone (FLONASE) 50 MCG/ACT nasal spray Place 1 spray into both nostrils daily. 08/30/17  Yes Wilhelmina Mcardle, MD  Fluticasone Furoate (ARNUITY ELLIPTA) 100 MCG/ACT AEPB Inhale 1 puff into the lungs daily. 08/30/17  Yes Wilhelmina Mcardle, MD  furosemide (LASIX) 40 MG tablet Take 40 mg by mouth daily.   Yes [provider]  gabapentin (NEURONTIN) 300 MG capsule Take 300 mg by mouth at bedtime.   Yes [provider]  lidocaine (LMX) 4 % cream Apply 1 application topically 3 (three) times daily as needed.   Yes [provider]  loratadine (CLARITIN) 10 MG tablet Take 10 mg by mouth daily.   Yes [provider]  LORazepam (ATIVAN) 0.5 MG tablet Take 1 tablet (0.5 mg total) by mouth 2 (two) times daily as needed for anxiety. 05/23/17  Yes Roselee Nova, MD  losartan (COZAAR) 100 MG tablet Take 1 tablet (100 mg total) by mouth daily. 05/23/17  Yes Roselee Nova, MD  lovastatin (MEVACOR) 40 MG tablet Take 40 mg by mouth every Monday, Wednesday, and Friday.   Yes [provider]  mirabegron ER (MYRBETRIQ) 25 MG TB24 tablet Take 25 mg by mouth daily.   Yes [provider]  polyethylene glycol (MIRALAX / GLYCOLAX) 17 g packet Take 17 g by mouth daily.    Yes [provider]  sertraline (ZOLOFT) 25 MG tablet Take 25 mg by mouth daily.   Yes [provider]  traMADol (ULTRAM) 50 MG tablet Take 50 mg by mouth daily.     Yes [provider]  trimethoprim (TRIMPEX) 100 MG tablet Take 1 tablet (100 mg total) by mouth daily. 03/04/18  Yes MacDiarmid, Nicki Reaper, MD  conjugated estrogens (PREMARIN) vaginal cream Apply 0.'5mg'$  (pea-sized amount)  just inside the vaginal introitus with a finger-tip on  Monday, Wednesday and Friday nights. Patient not taking: No sig reported 04/15/19   Zara Council A, PA-C  doxycycline (VIBRA-TABS) 100 MG tablet Take 1 tablet (100 mg total) by mouth 2 (two) times daily. Patient not taking: No sig reported 10/07/20   Felipa Furnace, DPM  furosemide (LASIX) 20 MG tablet Take 1-2 tablets (20-40 mg total) by mouth as  directed. Take 2 tablets (40 mg) daily for weight greater than 140 pounds. When weight is less than 140 then take 1 tablet (20 mg) daily with an additional 20 mg as needed for weight gain overnight of 2 pounds. Patient not taking: Reported on 01/24/2021 11/18/20   Theora Gianotti, NP     Allergies 2,4-d dimethylamine (amisol); Celecoxib; Ciprofloxacin; Ibuprofen; Levamisole; Metronidazole; Naproxen; and Other   Family History  Problem Relation Age of [place]  . Stroke Mother   . Diabetes Father   . Heart disease Father   . Kidney disease Neg Hx   . Bladder Cancer Neg Hx   . Kidney cancer Neg Hx     Social History Social History   Tobacco Use  . Smoking status: Former  . Smokeless tobacco: Never  . Tobacco comments:    smoked in high school for a few years only  Vaping Use  . Vaping Use: Never used  Substance Use Topics  . Alcohol use: No    Alcohol/week: 0.0 standard drinks  . Drug use: No    Review of Systems Level 5 Caveat: Portions of the History and Physical including HPI and review of systems are unable to be completely obtained due to patient being a poor historian   Constitutional:   No known fever.  ENT:   No rhinorrhea. Cardiovascular:   No chest pain or syncope. Respiratory:   No dyspnea or cough. Gastrointestinal: Positive  abdominal pain.  No vomiting or diarrhea Musculoskeletal: Positive facial pain ____________________________________________   PHYSICAL EXAM:  VITAL SIGNS: ED Triage Vitals  Enc Vitals Group     BP 01/24/21 1058 (!) 169/59     Pulse Rate 01/24/21 1058 68     Resp 01/24/21 1058 20     Temp 01/24/21 1058 98.6 F (37 C)     Temp Source 01/24/21 1058 Oral     SpO2 01/24/21 1058 94 %     Weight 01/24/21 1059 136 lb 11 oz (62 kg)     Height 01/24/21 1059 '5\' 4"'$  (1.626 m)     Head Circumference --      Peak Flow --      Pain Score 01/24/21 1059 0     Pain Loc --      Pain Edu? --      Excl. in [place]? --     Vital signs reviewed, nursing assessments reviewed.   Constitutional:   Alert and oriented to self.  Eyes:   Conjunctivae are normal. EOMI. PERRL. ENT      Head:   Normocephalic with diffuse ecchymosis of varying ages consistent with multiple falls.      Nose:   No congestion/rhinnorhea.       Mouth/Throat:   Dry mucous membranes, no pharyngeal erythema. No peritonsillar mass.       Neck:   No meningismus. Full ROM.  No midline spinal tenderness Hematological/Lymphatic/Immunilogical:   No cervical lymphadenopathy. Cardiovascular:   RRR. Symmetric bilateral radial and DP pulses.  No murmurs. Cap refill less than 2 seconds. Respiratory:   Normal respiratory effort without tachypnea/retractions. Breath sounds are clear and equal bilaterally. No wheezes/rales/rhonchi. Gastrointestinal:   Soft with diffuse upper abdominal tenderness. Non distended.  No rebound, rigidity, or guarding.  Musculoskeletal:   Normal range of motion in all extremities. No joint effusions.  No lower extremity tenderness.  1+ peripheral edema bilateral lower extremities, symmetric.  Scattered bruises on the extremities consistent with multiple falls Neurologic:   Confused.  Clear  speech, limited language range Motor grossly intact.   Skin:    Skin is warm, dry and intact.  Multiple bruises.  No petechiae,  purpura, or bullae.  ____________________________________________    LABS (pertinent positives/negatives) (all labs ordered are listed, but only abnormal results are displayed) Labs Reviewed  BASIC METABOLIC PANEL - Abnormal; Notable for the following components:      Result Value   Chloride 97 (*)    Glucose, Bld 144 (*)    BUN 26 (*)    Creatinine, Ser 1.53 (*)    GFR, Estimated 32 (*)    All other components within normal limits  CBC - Abnormal; Notable for the following components:   WBC 22.3 (*)    Hemoglobin 15.5 (*)    HCT 46.1 (*)    Platelets 147 (*)    All other components within normal limits  URINALYSIS, COMPLETE (UACMP) WITH MICROSCOPIC   ____________________________________________   EKG  Interpreted by me Atrial fibrillation, rate of 78.  Normal axis, normal intervals.  Poor R wave progression.  Normal ST segments and T waves.  No acute ischemic changes.  ____________________________________________    RADIOLOGY  No results found.  ____________________________________________   PROCEDURES Procedures  ____________________________________________  DIFFERENTIAL DIAGNOSIS   Intracranial hemorrhage, facial fracture, pneumonia, UTI, dehydration, electrolyte abnormality, deconditioning, dementia  CLINICAL IMPRESSION / ASSESSMENT AND PLAN / ED COURSE  Medications ordered in the ED: Medications  sodium chloride 0.9 % bolus 500 mL (has no administration in time range)    Pertinent labs & imaging results that were available during my care of the patient were reviewed by me and considered in my medical decision making (see chart for details).   Jodi Bullock was evaluated in Emergency Department on 01/24/2021 for the symptoms described in the history of present illness. She was evaluated in the context of the global COVID-19 pandemic, which necessitated consideration that the patient might be at risk for infection with the SARS-CoV-2 virus that causes  COVID-19. Institutional protocols and algorithms that pertain to the evaluation of patients at risk for COVID-19 are in a state of rapid change based on information released by regulatory bodies including the CDC and federal and state organizations. These policies and algorithms were followed during the patient's care in the ED.   Patient presents with recurrent falls.  No focal tenderness on exam except for in the abdomen.  CT of the head and face are unremarkable.  Chest x-ray unremarkable.  Will need urinalysis, CT chest abdomen pelvis to look for occult infectious sources given leukocytosis, AKI and evidence of dehydration.  She is not septic.  Clinical Course as of 01/24/21 1515  Mon Jan 24, 2021  1454 Urinalysis consistent with UTI.  CT abdomen suggest pyelonephritis as well.  We will start Rocephin and plan to admit due to encephalopathy and high risk of falls and further injury [PS]  1514 Discussed results with the patient, who clearly is not able to understand.  She responds incoherently.  She is clearly confused, appears fearful.  May be a degree of delirium present.  I will give her low-dose Haldol to help with this. [PS]    Clinical Course User Index [PS] Carrie Mew, MD     ____________________________________________   FINAL CLINICAL IMPRESSION(S) / ED DIAGNOSES    Final diagnoses:  Weakness generalized  Blunt trauma     ED Discharge Orders     None       Portions of this note were generated with  Lobbyist. Dictation errors may occur despite best attempts at proofreading.   Carrie Mew, MD 01/24/21 1340

## 2021-01-24 NOTE — ED Notes (Addendum)
Patient to CT at this time. Patient not happy with staff. Patient trying to hit staff and saying that she would like to leave. Patient only alert and self and year. Patient states she does not know why she is here. Purewick placed on patient. Joni Fears, MD aware.

## 2021-01-24 NOTE — H&P (Signed)
History and Physical   Jodi Bullock [id number] DOB: [birth year] DOA: 01/24/2021  PCP: Orvis Brill, [facility]  Outpatient Specialists: [facility] cardiology, Dr. Fletcher Anon Patient coming from: Facility  I have personally briefly reviewed patient's old medical records in [place].  Chief Concern: Altered mental status  HPI: Jodi Bullock is a 87 y.o. female with medical history significant for nonischemic cardiomyopathy, subsequent improvement in LV function, chronic heart failure with improved ejection fraction, hypertension, hyperlipidemia, right breast cancer, thymus cancer, history of COVID-19 infection in January 2021, permanent atrial fibrillation, history of bladder incontinence, depression, GERD, history of right femur closed fracture in 2019, managed conservatively, history of right humerus head fracture in 2018, managed conservatively, history of right knee surgery, history of cataract surgery, history of right hip joint replacement presents to the emergency department from facility for chief concerns of frequent falls over the last 2 weeks.  She was brought to the emergency department from [place] for decreased level of consciousness.  Patient has been having increased falling for the last 2 weeks.  Per EMS there was a bottle of Robitussin and another on the floor in the patient's room.  At bedside patient is able to tell me her name and knows that she is in the hospital.  She gets very agitated when I try to listen to her heart and talk to her.  She states that she is never felt better.  She states she wants to be left alone.  She states she is not hurting anywhere.  On physical exam, PA patient has diffuse ecchymosis of her face and extremities, bilateral eyes.  She attempts to hit nursing and myself when we perform a thorough physical exam.  There is bilateral lower extremity 1+ to 2+ pitting edema on presentation.  Attempts at assessing her extremity edema resulted in her  yelling at [name].  Social history: Is from [place].  Patient is a former tobacco user, only smoked in high school.  Patient currently does not consume EtOH.  No drug use.  Vaccination history: Unknown  ROS: Unable to complete as patient appears to acute confusion on dementia  ED Course: Discussed with emergency medicine provider, patient requiring hospitalization for chief concerns of pyelonephritis.  Vitals in the emergency department was remarkable for temperature 98.6, respiration rate of 20, heart rate 68, initial blood pressure 169/59, SPO2 of 94% on room air.  CT head without contrast was read as no acute or traumatic finding.  Atrophy and chronic small vessel ischemic changes of white matter.  CT maxillofacial without contrast was read as no acute or traumatic finding.    CT chest abdomen pelvis without contrast was read as increased soft tissue stranding seen in the lower pole and mid region of the left kidney, concerning for pyelonephritis.  Soft tissue nodule of the right mainstem bronchus, possibly adherent debris.  Recommended to follow-up with CT of the chest in 3 months to ensure resolution.  Small solid bilateral pulmonary nodules.  Assessment/Plan  Principal Problem:   Pyelonephritis Active Problems:   Hypertension   HLD (hyperlipidemia)   Anxiety   Polypharmacy   Arthritis, degenerative   Ankle edema   Dyslipidemia   Systolic CHF with reduced left ventricular function, NYHA class 3 (HCC)   Acute bilateral low back pain without sciatica   Abnormal gait   Vitamin B12 deficiency anemia, unspecified   Ecchymosis on examination   Frequent falls   Debility   Failure to thrive in adult   Protein-calorie malnutrition,  severe ([place])   # Altered mental status-etiology work-up in progress, differentials include pyelonephritis versus encephalopathy - Status post ceftriaxone 2 g IV, we will continue this, 4 doses ordered - TSH on 09/30/2020 was within normal limits at  0.86 - Check B12, ammonia - Aspiration precautions, fall precautions  # Right mainstem bronchus nodule-possible for debris - Recommend PCP to order a CT of the chest in 3 months to ensure resolution - Aspiration precautions - N.p.o. - SLP ordered  # Acute kidney injury on CKD 3 A/no CKD - Presumed prerenal versus secondary to intrarenal in setting of pyelonephritis - Ceftriaxone 2 g IV, 4 additional doses ordered - Sodium chloride 125 mL/h, 1 day ordered - BMP in the a.m.  # Atrial flutter-patient was stopped on Eliquis due to risk of bleeding and frequent falls  # Bilateral solid pulmonary nodules-follow-up with CT chest, noncontrast in 12 months if patient is high risk # Severe protein/calorie malnutrition-dietitian has been consulted # Dementia with agitation-continue Haldol 2 mg IV as needed for agitation # Aspiration risk-n.p.o. except for sips with meds and ice chips, SLP ordered for swallow study # Debility/age-related weakness-PT, OT ordered # Failure to thrive in adult # History of stable small subarachnoid hemorrhage, left posterior parietal cortex - No pharmacologic DVT prophylaxis  Son last spoke with patient on Saturday, 9/10 on the phone, saw her last week. Last Wednesday, niece took her out, and she was her baseline self on 9/7 Son states that patient likes chocolate flavored Ensure  Chart reviewed.   DVT prophylaxis: TED hose Code Status: DNR/DNI Diet: N.p.o. except for ice chips and sips with meds Family Communication: Updated son Disposition Plan: Pending clinical course; patient has overall poor prognosis in setting of failure to thrive Consults called: Dietitian, PT, OT, SLP Admission status: MedSurg, observation, telemetry ordered for 24 hours  Past Medical History:  Diagnosis Date   Arthritis    Atrial flutter ([place])    a. Dx 07/2018. CHA2DS2VASc = 5-->Eliquis.   Bladder incontinence    Breast cancer ([place]) 1998   Chronic heart failure with improved  ejection fraction (HFimpEF) 01/10/2016   a. 09/2015 Echo: EF 40-45%, possible inf HK, mod PAH (PASP 8mHg); b. 11/2018 Echo: EF 60-65%, RVSP 665mg. Mod dil LA, mildly dil RA. Mod MR.   [place]   a. 08/2017->Managed conservatively.   COVID-19 virus infection    a. 05/2019 - asymptomatic.   Depression    GERD (gastroesophageal reflux disease)    History of kidney stones    HLD (hyperlipidemia)    Hypertension    Neuropathy of both feet    NIC[place] ([place]ischemic cardiomyopathy) ([place]   a. 08/2015 Lexiscan MV: EF 39%, no ischemia; b. 09/2015 Echo: EF 40-45%; c. 11/2018 Echo: EF 60-65%.   Persistent atrial fibrillation (HCC)    Right Humerus head fracture    a. 07/2016 - managed conservatively.   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   cancer   Past Surgical History:  Procedure Laterality Date   ABDOMINAL HYSTERECTOMY     BREAST LUMPECTOMY     CATARACT EXTRACTION     CHOLECYSTECTOMY     JOINT REPLACEMENT Right    Hip   KNEE SURGERY Right    Social History:  reports that she has quit smoking. She has never used smokeless tobacco. She reports that she does not drink alcohol and does not use drugs.  Allergies  Allergen Reactions   2,4-D Dimethylamine (Amisol) Other (See Comments)  Other Reaction: OTHER REACTION-IRRITATION TO Esophagous   Celecoxib Other (See Comments)     esophagitis Esophageal spasms   Other reaction(s): Unknown   Ciprofloxacin Other (See Comments)    Esophageal irritation Other reaction(s): Unknown   Ibuprofen Other (See Comments)    Other reaction(s): Other (See Comments) reflux Other reaction(s): Unknown   Levamisole Other (See Comments) and Nausea And Vomiting    Other reaction(s): Unknown   Metronidazole Other (See Comments)    neuropathy Other reaction(s): Unknown   Naproxen Other (See Comments)    GI UPSET Other reaction(s): Unknown   Other Other (See Comments)    Other Reaction: esophagitis   Family History  Problem Relation Age of  Onset   Stroke Mother    Diabetes Father    Heart disease Father    Kidney disease Neg Hx    Bladder Cancer Neg Hx    Kidney cancer Neg Hx    Family history: Family history reviewed and not pertinent  Prior to Admission medications   Medication Sig Start Date End Date Taking? Authorizing Provider  acetaminophen (TYLENOL) 650 MG CR tablet Take 650 mg by mouth 3 (three) times daily.   Yes [provider]  apixaban (ELIQUIS) 5 MG TABS tablet Take 5 mg by mouth 2 (two) times daily.   Yes [provider]  carvedilol (COREG) 6.25 MG tablet Take 1 tablet (6.25 mg total) by mouth 2 (two) times daily with a meal. 07/08/20  Yes Wellington Hampshire, MD  Cholecalciferol 2000 UNITS TABS Take 2,000 Units by mouth daily.   Yes Keith Rake Asad A, MD  diclofenac sodium (VOLTAREN) 1 % GEL Apply 2 g topically 2 (two) times daily as needed (knee pain).   Yes [provider]  docusate sodium (COLACE) 100 MG capsule Take 100 mg by mouth at bedtime.    Yes [provider]  fluticasone (FLONASE) 50 MCG/ACT nasal spray Place 1 spray into both nostrils daily. 08/30/17  Yes Wilhelmina Mcardle, MD  Fluticasone Furoate (ARNUITY ELLIPTA) 100 MCG/ACT AEPB Inhale 1 puff into the lungs daily. 08/30/17  Yes Wilhelmina Mcardle, MD  furosemide (LASIX) 40 MG tablet Take 40 mg by mouth daily.   Yes [provider]  gabapentin (NEURONTIN) 300 MG capsule Take 300 mg by mouth at bedtime.   Yes [provider]  lidocaine (LMX) 4 % cream Apply 1 application topically 3 (three) times daily as needed.   Yes [provider]  loratadine (CLARITIN) 10 MG tablet Take 10 mg by mouth daily.   Yes [provider]  LORazepam (ATIVAN) 0.5 MG tablet Take 1 tablet (0.5 mg total) by mouth 2 (two) times daily as needed for anxiety. 05/23/17  Yes Roselee Nova, MD  losartan (COZAAR) 100 MG tablet Take 1 tablet (100 mg total) by mouth daily. 05/23/17  Yes Roselee Nova, MD   lovastatin (MEVACOR) 40 MG tablet Take 40 mg by mouth every Monday, Wednesday, and Friday.   Yes [provider]  mirabegron ER (MYRBETRIQ) 25 MG TB24 tablet Take 25 mg by mouth daily.   Yes [provider]  polyethylene glycol (MIRALAX / GLYCOLAX) 17 g packet Take 17 g by mouth daily.    Yes [provider]  sertraline (ZOLOFT) 25 MG tablet Take 25 mg by mouth daily.   Yes [provider]  traMADol (ULTRAM) 50 MG tablet Take 50 mg by mouth daily.    Yes [provider]  trimethoprim (TRIMPEX)  100 MG tablet Take 1 tablet (100 mg total) by mouth daily. 03/04/18  Yes MacDiarmid, Nicki Reaper, MD  conjugated estrogens (PREMARIN) vaginal cream Apply 0.'5mg'$  (pea-sized amount)  just inside the vaginal introitus with a finger-tip on  Monday, Wednesday and Friday nights. Patient not taking: No sig reported 04/15/19   Zara Council A, PA-C  doxycycline (VIBRA-TABS) 100 MG tablet Take 1 tablet (100 mg total) by mouth 2 (two) times daily. Patient not taking: No sig reported 10/07/20   Felipa Furnace, DPM  furosemide (LASIX) 20 MG tablet Take 1-2 tablets (20-40 mg total) by mouth as directed. Take 2 tablets (40 mg) daily for weight greater than 140 pounds. When weight is less than 140 then take 1 tablet (20 mg) daily with an additional 20 mg as needed for weight gain overnight of 2 pounds. Patient not taking: Reported on 01/24/2021 11/18/20   Theora Gianotti, NP   Physical Exam: Vitals:   01/24/21 1059 01/24/21 1324 01/24/21 1503 01/24/21 1600  BP:  (!) 165/88 (!) 140/111 (!) 158/86  Pulse:  80 82 63  Resp:  '18 18 18  '$ Temp:      TempSrc:      SpO2:  93% 94% 97%  Weight: 62 kg     Height: '5\' 4"'$  (1.626 m)      Constitutional: appears age-appropriate, frail, cachectic, NAD, calm, comfortable Eyes: PERRL, lids and conjunctivae normal ENMT: Mucous membranes are moist. Posterior pharynx clear of any exudate or lesions. Age-appropriate dentition. Hearing  appropriate Neck: normal, supple, no masses, no thyromegaly Respiratory: clear to auscultation bilaterally, no wheezing, no crackles. Normal respiratory effort. No accessory muscle use.  Cardiovascular: irregular rate and rhythm, no murmurs / rubs / gallops. No extremity edema. 2+ pedal pulses. No carotid bruits.  Abdomen: no tenderness, no masses palpated, no hepatosplenomegaly. Bowel sounds positive.  Musculoskeletal: no clubbing / cyanosis. No joint deformity upper and lower extremities. Good ROM, no contractures, no atrophy. Normal muscle tone.  Skin: no rashes, lesions, ulcers. No induration.  Ecchymosis throughout Neurologic: Sensation intact. Strength 5/5 in all 4.  Psychiatric: Normal judgment and insight. Alert and awake. Normal mood.   EKG: independently reviewed, showing atrial fibrillation/flutter with rate of 78, QTc 405  Chest x-ray on Admission: I personally reviewed and I agree with radiologist reading as below.  CT HEAD WO CONTRAST  Result Date: 01/24/2021 CLINICAL DATA:  Decreased level of consciousness.  Recent fall. EXAM: CT HEAD WITHOUT CONTRAST TECHNIQUE: Contiguous axial images were obtained from the base of the skull through the vertex without intravenous contrast. COMPARISON:  01/09/2021. FINDINGS: Brain: Age related atrophy. Chronic small-vessel ischemic changes of the white matter. No sign of acute infarction, mass lesion, hemorrhage, hydrocephalus or extra-axial collection. Vascular: There is atherosclerotic calcification of the major vessels at the base of the brain. Skull: Negative Sinuses/Orbits: Previous functional endoscopic sinus surgery. Orbits negative. Other: None IMPRESSION: No acute or traumatic finding. Atrophy and chronic small-vessel ischemic changes of the white matter. Electronically Signed   By: Nelson Chimes M.D.   On: 01/24/2021 14:08   DG Chest Portable 1 View  Result Date: 01/24/2021 CLINICAL DATA:  Weakness EXAM: PORTABLE CHEST 1 VIEW COMPARISON:   Right humerus radiograph 07/20/2016 FINDINGS: Unchanged, enlarged cardiac silhouette with prior median sternotomy and mediastinal postsurgical changes. Evidence of prior right lower lung wedge resection. There are mild diffuse interstitial opacities and possible patchy peripheral airspace opacities bilaterally. Chronic right proximal humerus fracture with residual deformity. Bilateral AC joint arthropathy. Bilateral glenohumeral  arthritis. Thoracic spondylosis. No acute osseous abnormality. IMPRESSION: Unchanged cardiomegaly. Diffuse interstitial opacities with possible patchy airspace disease in the periphery bilaterally, suggestive of mild edema or atypical multifocal infectious process. Electronically Signed   By: Maurine Simmering M.D.   On: 01/24/2021 13:53   CT CHEST ABDOMEN PELVIS WO CONTRAST  Result Date: 01/24/2021 CLINICAL DATA:  Abdominal pain EXAM: CT CHEST, ABDOMEN AND PELVIS WITHOUT CONTRAST TECHNIQUE: Multidetector CT imaging of the chest, abdomen and pelvis was performed following the standard protocol without IV contrast. COMPARISON:  CT abdomen and pelvis dated October 20, 2020 FINDINGS: CT CHEST FINDINGS Cardiovascular: Cardiomegaly. No pericardial effusion. Coronary artery calcifications status post CABG. Atherosclerotic disease of the thoracic aorta. Mediastinum/Nodes: Esophagus is unremarkable. No pathologically enlarged lymph nodes seen in the chest. Lungs/Pleura: Soft tissue nodule of the right mainstem bronchus. Post surgical changes of the right lower lobe. Additional bilateral basilar linear opacities, likely due to scarring or atelectasis. Bilateral solid pulmonary nodules. Reference nodule of the left upper lobe measures 4 mm on series 4, image 38. No consolidation, pleural effusion or pneumothorax. Musculoskeletal: Mild compression deformities of T11, T10, unchanged compared to prior. Mild T4 compression deformity. CT ABDOMEN PELVIS FINDINGS Hepatobiliary: No focal liver abnormality is seen.  Status post cholecystectomy. No biliary dilatation. Pancreas: Cystic lesion of the pancreatic head, measuring up to 1.6 cm, unchanged compared to prior. Spleen: Unremarkable. Adrenals/Urinary Tract: Adrenal glands are unremarkable. No hydronephrosis. Punctate bilateral nonobstructing renal stones increased soft tissue stranding seen about the lower pole and mid region of the left kidney. Bladder is not well visualized due to streak artifact. Stomach/Bowel: Stomach is within normal limits. Appendix is not visualized. No evidence of bowel wall thickening, distention, or inflammatory changes. Vascular/Lymphatic: Aortic atherosclerosis. No enlarged abdominal or pelvic lymph nodes. Reproductive: Status post hysterectomy. No adnexal masses. Other: Previous ventral wall hernia repair. No abdominopelvic ascites. Musculoskeletal: Prior right total hip arthroplasty. Grade 1 anterolisthesis of L4 on L5. Compression deformities of L3 and L1, unchanged compared to prior. IMPRESSION: Increased soft tissue stranding seen about the lower pole and mid region of the left kidney, concerning for pyelonephritis. Correlate with urinalysis. Soft tissue nodule of the right mainstem bronchus, possibly adherent debris. Recommend follow-up chest CT in 3 months to ensure resolution. Small solid bilateral pulmonary nodules. No follow-up needed if patient is low-risk. Non-contrast chest CT can be considered in 12 months if patient is high-risk. This recommendation follows the consensus statement: Guidelines for Management of Incidental Pulmonary Nodules Detected on CT Images: From the Fleischner Society 2017; Radiology 2017; 284:228-243. Aortic Atherosclerosis (ICD10-I70.0). Electronically Signed   By: Yetta Glassman M.D.   On: 01/24/2021 14:46   CT Maxillofacial Wo Contrast  Result Date: 01/24/2021 CLINICAL DATA:  Decreased level of consciousness.  Recent fall. EXAM: CT HEAD WITHOUT CONTRAST TECHNIQUE: Contiguous axial images were  obtained from the base of the skull through the vertex without intravenous contrast. COMPARISON:  01/09/2021. FINDINGS: Brain: Age related atrophy. Chronic small-vessel ischemic changes of the white matter. No sign of acute infarction, mass lesion, hemorrhage, hydrocephalus or extra-axial collection. Vascular: There is atherosclerotic calcification of the major vessels at the base of the brain. Skull: Negative Sinuses/Orbits: Previous functional endoscopic sinus surgery. Orbits negative. Other: None IMPRESSION: No acute or traumatic finding. Atrophy and chronic small-vessel ischemic changes of the white matter. Electronically Signed   By: Nelson Chimes M.D.   On: 01/24/2021 14:08    Labs on Admission: I have personally reviewed following labs CBC: Recent Labs  Lab 01/24/21 1102  WBC 22.3*  HGB 15.5*  HCT 46.1*  MCV 95.6  PLT [id number]*   Basic Metabolic Panel: Recent Labs  Lab 01/24/21 1102  NA 135  K 4.4  CL 97*  CO2 27  GLUCOSE 144*  BUN 26*  CREATININE 1.53*  CALCIUM 9.3   GFR: Estimated Creatinine Clearance: 19.8 mL/min (A) (by C-G formula based on SCr of 1.53 mg/dL (H)).  Urine analysis:    Component Value Date/Time   COLORURINE AMBER (A) 01/24/2021 1323   APPEARANCEUR CLOUDY (A) 01/24/2021 1323   APPEARANCEUR Hazy (A) 05/21/2019 1142   LABSPEC 1.033 (H) 01/24/2021 1323   PHURINE 5.0 01/24/2021 1323   GLUCOSEU NEGATIVE 01/24/2021 1323   HGBUR NEGATIVE 01/24/2021 1323   BILIRUBINUR NEGATIVE 01/24/2021 1323   BILIRUBINUR Negative 05/21/2019 1142   KETONESUR 5 (A) 01/24/2021 1323   PROTEINUR >=300 (A) 01/24/2021 1323   NITRITE NEGATIVE 01/24/2021 1323   LEUKOCYTESUR SMALL (A) 01/24/2021 1323   Dr. Tobie Poet [facility]  If 7PM-7AM, please contact overnight-coverage provider If 7AM-7PM, please contact day coverage provider [email or web address]  01/24/2021, 5:40 PM

## 2021-01-24 NOTE — ED Triage Notes (Signed)
Pt comes into the ED via EMS from [facility], decreased level of consciousness Pt was here on 8/28 for a fall and states she fell again on Saturday EMS reports a bottle of robitussin and another on the floor in the pt room  [id number] 94%RA 190/78 72HR

## 2021-01-24 NOTE — ED Notes (Signed)
Patient resting comfortably at this time. Respirations even and unlabored. No signs of distress.

## 2021-01-24 NOTE — ED Notes (Signed)
Lab at bedside

## 2021-01-24 NOTE — ED Notes (Signed)
Update given to patient's son, Marya Amsler.

## 2021-01-25 DIAGNOSIS — M545 Low back pain, unspecified: Secondary | ICD-10-CM | POA: Diagnosis not present

## 2021-01-25 DIAGNOSIS — N12 Tubulo-interstitial nephritis, not specified as acute or chronic: Secondary | ICD-10-CM | POA: Diagnosis not present

## 2021-01-25 DIAGNOSIS — I4821 Permanent atrial fibrillation: Secondary | ICD-10-CM | POA: Diagnosis present

## 2021-01-25 DIAGNOSIS — E43 Unspecified severe protein-calorie malnutrition: Secondary | ICD-10-CM | POA: Diagnosis present

## 2021-01-25 DIAGNOSIS — N10 Acute tubulo-interstitial nephritis: Secondary | ICD-10-CM | POA: Diagnosis present

## 2021-01-25 DIAGNOSIS — D519 Vitamin B12 deficiency anemia, unspecified: Secondary | ICD-10-CM | POA: Diagnosis present

## 2021-01-25 DIAGNOSIS — N1831 Chronic kidney disease, stage 3a: Secondary | ICD-10-CM | POA: Diagnosis present

## 2021-01-25 DIAGNOSIS — K219 Gastro-esophageal reflux disease without esophagitis: Secondary | ICD-10-CM | POA: Diagnosis present

## 2021-01-25 DIAGNOSIS — G2581 Restless legs syndrome: Secondary | ICD-10-CM | POA: Diagnosis present

## 2021-01-25 DIAGNOSIS — M5030 Other cervical disc degeneration, unspecified cervical region: Secondary | ICD-10-CM | POA: Diagnosis present

## 2021-01-25 DIAGNOSIS — M25473 Effusion, unspecified ankle: Secondary | ICD-10-CM

## 2021-01-25 DIAGNOSIS — F0391 Unspecified dementia with behavioral disturbance: Secondary | ICD-10-CM | POA: Diagnosis present

## 2021-01-25 DIAGNOSIS — F32A Depression, unspecified: Secondary | ICD-10-CM | POA: Diagnosis present

## 2021-01-25 DIAGNOSIS — N179 Acute kidney failure, unspecified: Secondary | ICD-10-CM | POA: Diagnosis present

## 2021-01-25 DIAGNOSIS — I4892 Unspecified atrial flutter: Secondary | ICD-10-CM | POA: Diagnosis present

## 2021-01-25 DIAGNOSIS — Z66 Do not resuscitate: Secondary | ICD-10-CM | POA: Diagnosis present

## 2021-01-25 DIAGNOSIS — R269 Unspecified abnormalities of gait and mobility: Secondary | ICD-10-CM | POA: Diagnosis present

## 2021-01-25 DIAGNOSIS — F419 Anxiety disorder, unspecified: Secondary | ICD-10-CM

## 2021-01-25 DIAGNOSIS — R911 Solitary pulmonary nodule: Secondary | ICD-10-CM | POA: Diagnosis present

## 2021-01-25 DIAGNOSIS — I2510 Atherosclerotic heart disease of native coronary artery without angina pectoris: Secondary | ICD-10-CM | POA: Diagnosis present

## 2021-01-25 DIAGNOSIS — M47812 Spondylosis without myelopathy or radiculopathy, cervical region: Secondary | ICD-10-CM | POA: Diagnosis present

## 2021-01-25 DIAGNOSIS — I5020 Unspecified systolic (congestive) heart failure: Secondary | ICD-10-CM | POA: Diagnosis present

## 2021-01-25 DIAGNOSIS — G9341 Metabolic encephalopathy: Secondary | ICD-10-CM | POA: Diagnosis present

## 2021-01-25 DIAGNOSIS — I130 Hypertensive heart and chronic kidney disease with heart failure and stage 1 through stage 4 chronic kidney disease, or unspecified chronic kidney disease: Secondary | ICD-10-CM | POA: Diagnosis present

## 2021-01-25 DIAGNOSIS — Z515 Encounter for palliative care: Secondary | ICD-10-CM | POA: Diagnosis not present

## 2021-01-25 DIAGNOSIS — E785 Hyperlipidemia, unspecified: Secondary | ICD-10-CM | POA: Diagnosis present

## 2021-01-25 DIAGNOSIS — G894 Chronic pain syndrome: Secondary | ICD-10-CM | POA: Diagnosis present

## 2021-01-25 DIAGNOSIS — Z8616 Personal history of COVID-19: Secondary | ICD-10-CM | POA: Diagnosis not present

## 2021-01-25 LAB — CBC
HCT: 41.9 % (ref 36.0–46.0)
Hemoglobin: 14.5 g/dL (ref 12.0–15.0)
MCH: 32.4 pg (ref 26.0–34.0)
MCHC: 34.6 g/dL (ref 30.0–36.0)
MCV: 93.5 fL (ref 80.0–100.0)
Platelets: 147 10*3/uL — ABNORMAL LOW (ref 150–400)
RBC: 4.48 MIL/uL (ref 3.87–5.11)
RDW: 12.6 % (ref 11.5–15.5)
WBC: 21.6 10*3/uL — ABNORMAL HIGH (ref 4.0–10.5)
nRBC: 0 % (ref 0.0–0.2)

## 2021-01-25 LAB — BASIC METABOLIC PANEL
Anion gap: 13 (ref 5–15)
BUN: 26 mg/dL — ABNORMAL HIGH (ref 8–23)
CO2: 22 mmol/L (ref 22–32)
Calcium: 9 mg/dL (ref 8.9–10.3)
Chloride: 102 mmol/L (ref 98–111)
Creatinine, Ser: 1.22 mg/dL — ABNORMAL HIGH (ref 0.44–1.00)
GFR, Estimated: 41 mL/min — ABNORMAL LOW (ref >60)
Glucose, Bld: 133 mg/dL — ABNORMAL HIGH (ref 70–99)
Potassium: 4 mmol/L (ref 3.5–5.1)
Sodium: 137 mmol/L (ref 135–145)

## 2021-01-25 LAB — VITAMIN B12: Vitamin B-12: 1182 pg/mL — ABNORMAL HIGH (ref 180–914)

## 2021-01-25 MED ORDER — HALOPERIDOL LACTATE 5 MG/ML IJ SOLN
2.5000 mg | INTRAMUSCULAR | Status: DC | PRN
  Administered 2021-01-26: 11:00:00 2.5 mg via INTRAVENOUS
  Filled 2021-01-25: qty 1

## 2021-01-25 MED ORDER — MORPHINE SULFATE (PF) 2 MG/ML IV SOLN
2.0000 mg | INTRAVENOUS | Status: DC | PRN
  Administered 2021-01-25 – 2021-01-27 (×10): 2 mg via INTRAVENOUS
  Filled 2021-01-25 (×10): qty 1

## 2021-01-25 MED ORDER — DEXTROSE 5 % IV SOLN
INTRAVENOUS | Status: DC
  Administered 2021-01-25: 1000 mL via INTRAVENOUS

## 2021-01-25 MED ORDER — GLYCOPYRROLATE 0.2 MG/ML IJ SOLN
0.2000 mg | INTRAMUSCULAR | Status: DC | PRN
  Administered 2021-01-27: 0.2 mg via INTRAVENOUS
  Filled 2021-01-25 (×2): qty 1

## 2021-01-25 MED ORDER — GLYCOPYRROLATE 0.2 MG/ML IJ SOLN
0.2000 mg | INTRAMUSCULAR | Status: DC | PRN
  Filled 2021-01-25: qty 1

## 2021-01-25 MED ORDER — POLYVINYL ALCOHOL 1.4 % OP SOLN
1.0000 [drp] | Freq: Four times a day (QID) | OPHTHALMIC | Status: DC | PRN
  Filled 2021-01-25: qty 15

## 2021-01-25 MED ORDER — SODIUM CHLORIDE 0.9 % IV SOLN: INTRAVENOUS | Status: AC

## 2021-01-25 MED ORDER — DIPHENHYDRAMINE HCL 50 MG/ML IJ SOLN
25.0000 mg | INTRAMUSCULAR | Status: DC | PRN
  Administered 2021-01-25 – 2021-01-26 (×2): 25 mg via INTRAVENOUS
  Filled 2021-01-25 (×2): qty 1

## 2021-01-25 MED ORDER — ACETAMINOPHEN 650 MG RE SUPP: 650.0000 mg | Freq: Four times a day (QID) | RECTAL | Status: DC | PRN

## 2021-01-25 MED ORDER — LORAZEPAM 2 MG/ML IJ SOLN
2.0000 mg | INTRAMUSCULAR | Status: DC | PRN
  Administered 2021-01-26: 2 mg via INTRAVENOUS
  Filled 2021-01-25: qty 1

## 2021-01-25 MED ORDER — GLYCOPYRROLATE 1 MG PO TABS
1.0000 mg | ORAL_TABLET | ORAL | Status: DC | PRN
  Filled 2021-01-25: qty 1

## 2021-01-25 MED ORDER — ACETAMINOPHEN 325 MG PO TABS: 650.0000 mg | ORAL_TABLET | Freq: Four times a day (QID) | ORAL | Status: DC | PRN

## 2021-01-25 NOTE — Progress Notes (Signed)
1       [facility] NAME: Jodi Bullock    MR#:  [id number]  PCP: [facility]  DATE OF BIRTH:  [birth year]  SUBJECTIVE:  CHIEF COMPLAINT:   Chief Complaint  Patient presents with   Fall   Altered Mental Status  Patient keeps repeating wanting to leave and denies any insight into why she is here REVIEW OF SYSTEMS:  ROS unable to obtain due to her mental status DRUG ALLERGIES:   Allergies  Allergen Reactions   2,4-D Dimethylamine (Amisol) Other (See Comments)    Other Reaction: OTHER REACTION-IRRITATION TO Esophagous   Celecoxib Other (See Comments)     esophagitis Esophageal spasms   Other reaction(s): Unknown   Ciprofloxacin Other (See Comments)    Esophageal irritation Other reaction(s): Unknown   Ibuprofen Other (See Comments)    Other reaction(s): Other (See Comments) reflux Other reaction(s): Unknown   Levamisole Other (See Comments) and Nausea And Vomiting    Other reaction(s): Unknown   Metronidazole Other (See Comments)    neuropathy Other reaction(s): Unknown   Naproxen Other (See Comments)    GI UPSET Other reaction(s): Unknown   Other Other (See Comments)    Other Reaction: esophagitis   VITALS:  Blood pressure (!) 194/64, pulse 78, temperature (!) 97.4 F (36.3 C), resp. rate 16, height '5\' 4"'$  (1.626 m), weight 62 kg, SpO2 92 %. PHYSICAL EXAMINATION:  Physical Exam 87 year old female lying in the bed in pain and distress Eyes pupil equal round reactive to light and accommodation Face she has multiple bruises and ecchymosis all over her face Lungs clear to auscultation bilaterally no wheezing rales rhonchi or crepitation Cardiovascular S1-S2 normal, no murmur rales or gallop Abdomen soft, benign Neuro: Awake and alert but confused and disoriented, nonfocal Skin: Patient has open laceration and skin tear on her left forearm.  Multiple ecchymosis and bruising all over her body particularly on all  extremities likely from falls LABORATORY PANEL:  Female CBC Recent Labs  Lab 01/25/21 0908  WBC 21.6*  HGB 14.5  HCT 41.9  PLT 147*   ------------------------------------------------------------------------------------------------------------------ Chemistries  Recent Labs  Lab 01/25/21 0908  NA 137  K 4.0  CL 102  CO2 22  GLUCOSE 133*  BUN 26*  CREATININE 1.22*  CALCIUM 9.0   MEDICATIONS:  Scheduled Meds: Continuous Infusions:  sodium chloride 125 mL/hr at 01/25/21 1049   cefTRIAXone (ROCEPHIN)  IV 2 g (01/25/21 1051)   dextrose     RADIOLOGY:  No results found. ASSESSMENT AND PLAN:  87 year old female with a known history of nonischemic cardiomyopathy, essential hypertension, hyperlipidemia, right breast cancer, thymus cancer, history of COVID-19 in January 2021, permanent A. fib, history of bladder incontinence, depression, GERD, history of right femur fracture in 2019.  Right humerus head fracture in 2018.  Right knee surgery, cataract surgery, right hip joint replacement, multiple falls is admitted for altered mental status from [facility]  9/13: Comfort care in place.  Hospice screening pending  Principal Problem:   Pyelonephritis Active Problems:   Hypertension   HLD (hyperlipidemia)   Anxiety   Polypharmacy   Arthritis, degenerative   Ankle edema   Dyslipidemia   Systolic CHF with reduced left ventricular function, NYHA class 3 (HCC)   Acute bilateral low back pain without sciatica   Abnormal gait   Atrial flutter (HCC)   Vitamin B12 deficiency anemia, unspecified   Ecchymosis on examination   Frequent falls   Debility  Failure to thrive in adult   Protein-calorie malnutrition, severe (HCC)  Acute metabolic encephalopathy Acute pyelonephritis Right mainstem bronchus nodule/debris Acute kidney injury  Atrial flutter Bilateral solid pulmonary nodule Severe protein calorie malnutrition Dementia with behavioral disturbances Recurrent  falls Failure to thrive History of subarachnoid hemorrhage which is stable  Goals of care: Patient's husband passed away in Aug 18, 2020 and was under hospice care.  Since then patient has had significant clinical decline according to son.  Patient's son who is her POA is agreeable for transition to comfort care and hospice.  I have requested social worker to get hospice screening   Body mass index is 23.46 kg/m.  Net IO Since Admission: 442.27 mL [01/25/21 1437]      LOS: 0 days   Consultants: Palliative care    Antibiotics: Rocephin  Status is: Inpatient  The patient will require care spanning > 2 midnights and should be moved to inpatient because: Inpatient level of care appropriate due to severity of illness  Dispo: The patient is from:  [place]              Anticipated d/c is to:  [place] with hospice versus hospice home              Patient currently is not medically stable to d/c.   Difficult to place patient No    DVT prophylaxis:       Place TED hose Start: 01/24/21 1518     Family Communication: Discussed with son/POA over phone   All the records are reviewed and case discussed with Nursing and TOC team. Management plans discussed with the patient, family and they are in agreement.  CODE STATUS: DNR Level of care: Med-Surg  TOTAL TIME TAKING CARE OF THIS PATIENT: 35 minutes.   More than 50% of the time was spent in counseling/coordination of care: YES  POSSIBLE D/C IN 1-2 DAYS, DEPENDING ON CLINICAL CONDITION.   Max Sane M.D on 01/25/2021 at 2:37 PM  [facility]   CC: Primary care physician; [facility]  Note: This dictation was prepared with Dragon dictation along with smaller phrase technology. Any transcriptional errors that result from this process are unintentional.

## 2021-01-25 NOTE — Progress Notes (Signed)
PT Cancellation Note  Patient Details Name: Jodi Bullock MRN: [id number] DOB: 09/11/1924   Cancelled Treatment:    Reason Eval/Treat Not Completed: Medical issues which prohibited therapy. Orders received and chart reviewed. At 0755 BP recorded at 179/131 mm Hg. PT discussed with attending RN and will plan to hold for pt safety. Will re-attempt at later time/date as available.    Salem Caster. Fairly IV, PT, DPT Physical Therapist- [facility]  01/25/2021, 8:42 AM

## 2021-01-25 NOTE — Evaluation (Addendum)
Occupational Therapy Evaluation Patient Details Name: Jodi Bullock MRN: [id number] DOB: [birth year] Today's Date: 01/25/2021   History of Present Illness Jodi Bullock is a 87 y.o. female with a history of atrial fibrillation, heart failure, hypertension who was brought to the ED due to confusion and decreased alertness.   Clinical Impression   Pt seen for OT evaluation this date. Prior to admission, pt was residing at [facility]. Per discussion with [facility] staff, pt was receiving OT/PT prior to admission.This Pryor Curia is currently awaiting call from rehab team to discuss pt's PLOF and OT/PT goals. Will update as able. Upon arrival to room this date, pt with eyes closed and refusing to open eyes for session, however answering orientation questions and able to state needs/wants. This date, pt disoriented to self, place, and situation. With washcloth placed on face, pt able to remove with LUE following multi-modal cues, however refusing to engage futher in washing/dry face, and requiring MAX A to complete. Pt denying pain/discomfort however refusing to participate in additional bed-level ADLs and bed mobility and becoming more agitated with further discussion. Pt left in bed, with all needs within and with RN at bedside. Based on chart review and initial discussion with [facility] staff, pt's cognition and performance in ADLs/functional mobility is not at baseline. Pt would benefit from additional skilled OT services to maximize return to PLOF and minimize risk of future falls, injury, caregiver burden, and readmission. Upon discharge, recommend SNF.    Addendum: [place] reports that at baseline, pt performs ADLs with MOD A and functional mobility with RW and SUPERVISION.   Recommendations for follow up therapy are one component of a multi-disciplinary discharge planning process, led by the attending physician.  Recommendations may be updated based on patient status, additional  functional criteria and insurance authorization.   Follow Up Recommendations  SNF    Equipment Recommendations  Other (comment) (defer to next venue of care)       Precautions / Restrictions Precautions Precautions: Fall Restrictions Weight Bearing Restrictions: No      Mobility Bed Mobility               General bed mobility comments: pt declining to participate in mobility                          ADL either performed or assessed with clinical judgement   ADL Overall ADL's : Needs assistance/impaired     Grooming: Wash/dry face;Maximal assistance;Bed level Grooming Details (indicate cue type and reason): Pt able to use RUE to remove washcloth from face, however refusing to engage futher in washing/dry face, and requiring MAX A to complete             Lower Body Dressing: Maximal assistance;Bed level Lower Body Dressing Details (indicate cue type and reason): to don/doff socks                      Pertinent Vitals/Pain Pain Assessment: No/denies pain        Extremity/Trunk Assessment Upper Extremity Assessment Upper Extremity Assessment: Difficult to assess due to impaired cognition   Lower Extremity Assessment Lower Extremity Assessment: Difficult to assess due to impaired cognition       Communication Communication Communication: HOH   Cognition Arousal/Alertness: Lethargic Behavior During Therapy: Agitated Overall Cognitive Status: History of cognitive impairments - at baseline  General Comments: Upon arrival to room, pt with eyes closed and refusing to open for session, however answering orientation questions and able to state needs/wants. This date, pt disoriented to self, place, and situation. pt required MAX multi-modal cues to participate in bed-level ADLs   General Comments  pt with bruising on face and open wound on L elbow            Home Living Family/patient  expects to be discharged to:: Assisted living                                 Additional Comments: Pt resides at [facility].      Prior Functioning/Environment Level of Independence: Needs assistance        Comments: Per discussion with [facility] staff, pt currently receiving OT/PT at [facility]. Currently awaiting call from rehab team to discuss pt's PLOF and OT/PT goals.        OT Problem List: Decreased activity tolerance;Decreased cognition;Pain      OT Treatment/Interventions: Self-care/ADL training;Therapeutic exercise;DME and/or AE instruction;Therapeutic activities;Cognitive remediation/compensation;Patient/family education;Balance training    OT Goals(Current goals can be found in the care plan section) Acute Rehab OT Goals OT Goal Formulation: Patient unable to participate in goal setting Time For Goal Achievement: 02/08/21 ADL Goals Pt Will Perform Grooming: with min assist;bed level Pt Will Perform Upper Body Dressing: with min assist;bed level Pt Will Transfer to Toilet: with mod assist;stand pivot transfer;bedside commode  OT Frequency: Min 1X/week    AM-PAC OT "6 Clicks" Daily Activity     Outcome Measure Help from another person eating meals?: A Little Help from another person taking care of personal grooming?: A Lot Help from another person toileting, which includes using toliet, bedpan, or urinal?: Total Help from another person bathing (including washing, rinsing, drying)?: A Lot Help from another person to put on and taking off regular upper body clothing?: A Lot Help from another person to put on and taking off regular lower body clothing?: A Lot 6 Click Score: 12   End of Session Nurse Communication: Mobility status  Activity Tolerance: Treatment limited secondary to agitation Patient left: in bed;with call bell/phone within reach;with bed alarm set;with nursing/sitter in room  OT Visit Diagnosis: Muscle weakness  (generalized) (M62.81);History of falling (Z91.81)                Time: 1050-1103 OT Time Calculation (min): 13 min Charges:  OT General Charges $OT Visit: 1 Visit OT Evaluation $OT Eval Moderate Complexity: [place], OTR/L [place]

## 2021-01-25 NOTE — Progress Notes (Addendum)
[facility]) Hospital Liaison Note  Received request from Transitions of Care Manager Pricilla Riffle, LCSW for hospice services at [facility] after discharge. Chart and patient information reviewed by [facility] physician. Hospice eligibility confirmed.  Spoke with patient's son, Lillieann Ivers. He states he would like for his mother to return to [facility] with [facility] hospice services.  Initiated education related to hospice philosophy, services and team approach to care. Patient/family verbalized understanding of information provided.  DME needs discussed. There are no DME needs at this time.  Please send signed and completed DNR to [facility] with patient/family. Please provide prescriptions at discharge as needed to ensure ongoing symptom management.   [facility] information and contact numbers given to family. Above information shared with [place].   Please do not hesitate to call with any hospice related questions or concerns.   Thank you for the opportunity to participate in this patient's care.   Bobbie "Loren Racer, RN, BSN [facility] Liaison [phone]

## 2021-01-25 NOTE — Plan of Care (Signed)
  Problem: Education: Goal: Knowledge of General Education information will improve Description: Including pain rating scale, medication(s)/side effects and non-pharmacologic comfort measures 01/25/2021 1524 by Emmaline Life, RN Outcome: Progressing 01/25/2021 1523 by Emmaline Life, RN Outcome: Progressing

## 2021-01-25 NOTE — Hospital Course (Signed)
87 year old female with a known history of nonischemic cardiomyopathy, essential hypertension, hyperlipidemia, right breast cancer, thymus cancer, history of COVID-19 in January 2021, permanent A. fib, history of bladder incontinence, depression, GERD, history of right femur fracture in 2019.  Right humerus head fracture in 2018.  Right knee surgery, cataract surgery, right hip joint replacement, multiple falls is admitted for altered mental status from [facility]  9/13: Comfort care in place.  Hospice screening pending

## 2021-01-25 NOTE — Plan of Care (Signed)
  Problem: Education: Goal: Knowledge of General Education information will improve Description Including pain rating scale, medication(s)/side effects and non-pharmacologic comfort measures Outcome: Progressing   

## 2021-01-25 NOTE — Progress Notes (Signed)
SLP Cancellation Note  Patient Details Name: Tenaja Ohle MRN: [id number] DOB: 03/27/1925   Cancelled treatment:       Reason Eval/Treat Not Completed:  (chart reviewed). Per MD's note this PM, patient's son who is her POA is agreeable for transition to comfort care and hospice d/t her decline in status. ST services can be available for further needs per MD consult. Recommend oral care for comfort of hygiene and stimulation of swallowing and moist mouth if pt desires.      Orinda Kenner, MS, CCC-SLP Speech Language Pathologist Rehab Services [phone] [facility] 01/25/2021, 3:33 PM

## 2021-01-25 NOTE — Progress Notes (Signed)
Nutrition Brief Note  Chart reviewed.  Noted that after discussion with pt's son, pt now transitioning to comfort care. SLP evaluated earlier today and felt a safe PO diet could not be recommended. No aggressive nutrition interventions are warranted. Would recommend allowing for pleasure feeds and offering liquids if pt expresses interest/desire for nourishment.   No further nutrition interventions planned at this time.  Please re-consult as needed.   Ranell Patrick, RD, LDN Clinical Dietitian Pager on [place]

## 2021-01-25 NOTE — Progress Notes (Signed)
Pt alert with confusion, combative during care. Refused labs, refused oxygen and purewick. Removes Telemetry monitor. Yells when you try to help her and tries to hit or scratch caregivers. Bed alarm in use. Will continue to assess for safety.

## 2021-01-25 NOTE — Progress Notes (Signed)
   01/25/21 1605  Clinical Encounter Type  Visited With Patient  Visit Type Initial  Referral From Nurse  Consult/Referral To Chaplain  Spiritual Encounters  Spiritual Needs Prayer;Emotional  Chaplain Autumn Patty responded to an OR requesting emotional and grief support. Pt end of life, prayer was provided at the end of visit. OR completed

## 2021-01-25 NOTE — Evaluation (Signed)
Clinical/Bedside Swallow Evaluation Patient Details  Name: Jodi Bullock MRN: [id number] Date of Birth: [birth year]  Today's Date: 01/25/2021 Time: SLP Start Time (ACUTE ONLY): 1120 SLP Stop Time (ACUTE ONLY): 1215 SLP Time Calculation (min) (ACUTE ONLY): 55 min  Past Medical History:  Past Medical History:  Diagnosis Date   Arthritis    Atrial flutter ([place])    a. Dx 07/2018. CHA2DS2VASc = 5-->Eliquis.   Bladder incontinence    Breast cancer ([place]) 1998   Chronic heart failure with improved ejection fraction (HFimpEF) 01/10/2016   a. 09/2015 Echo: EF 40-45%, possible inf HK, mod PAH (PASP 68mHg); b. 11/2018 Echo: EF 60-65%, RVSP 682mg. Mod dil LA, mildly dil RA. Mod MR.   Closed fracture of right femur ([place]   a. 08/2017->Managed conservatively.   COVID-19 virus infection    a. 05/2019 - asymptomatic.   Depression    GERD (gastroesophageal reflux disease)    History of kidney stones    HLD (hyperlipidemia)    Hypertension    Neuropathy of both feet    N[place]ischemic cardiomyopathy) ([facility]   a. 08/2015 Lexiscan MV: EF 39%, no ischemia; b. 09/2015 Echo: EF 40-45%; c. 11/2018 Echo: EF 60-65%.   Persistent atrial fibrillation (HCC)    Right Humerus head fracture    a. 07/2016 - managed conservatively.   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 female with medical history significant for MULTIPLE medical issues including nonischemic cardiomyopathy, subsequent improvement in LV function, chronic heart failure with improved ejection fraction, hypertension, hyperlipidemia, right breast cancer, thymus cancer, history of COVID-19 infection in January 2021, permanent atrial fibrillation, history of bladder incontinence, depression, GERD, history of  right femur closed fracture in 2019, managed conservatively, history of right humerus head fracture in 2018, managed conservatively, history of right knee surgery, history of cataract surgery, history of right hip joint replacement, Major depression, single episode, mixed simple and mucopurulent chronic bronchitis, Falls who presents to the emergency department from facility for chief concerns of frequent falls over the last 2 weeks.  From a Cognitive standpoint, she was quite agitated, yelling, and attempting to hit/bite Staff during the physical exam in the ED.  Per ED MD note, "patient appears to have acute confusion on Dementia".  CT of Chest: "soft tissue nodule of the right mainstem bronchus.  Post surgical changes of the right lower lobe. Additional bilateral  basilar linear opacities, likely due to scarring or atelectasis.  Bilateral solid pulmonary nodules. Reference nodule of the left  upper lobe measures 4 mm on series 4, image 38. No consolidation,  pleural effusion or pneumothorax.".   Assessment / Plan / Recommendation Clinical Impression  Pt appears to present w/ oropharyngeal phase dysphagia in light of SIGNIFICANTLY declined Cognitive status; MD suspected Dementia per chart note. This degree of Cognitive decline and acute illness/confusion can impact her overall awareness/timing of swallow and safety during po's which increases risk for aspiration, choking. Pt's risk for aspiration is too High at this time for oral intake to be recommended.   She required MAX verbal/tactile/visual cues during few po tasks/trials offered at lips.  Pt tended to refuse all po's offered  but was semi-agreeable to 3 tsps of water put to lips. No immediate, overt decline in vocal quality; no cough, and no decline in respiratory status during/post trials. However, audibel swallows were noted w/ a strong suspicion of delayed pharyngeal swallow intiition. Oral phase was c/b poor awareness of boluses presented at lips;  she often did not readily receive boluses. Quick oral phase moments w/ the boluess leading to suspect poor oral awareness for appropriate, safe bolus management. OM Exam was quite limited d/t pt's Cognitive decline and reduced awareness for follow through w/ oral tasks. Noted native dentition. No overt, unilateral lingula weakness noted as she licked her lips. Much confusion w/ OM tasks and oral care attempted causing min+ agitation so an overall limited assessment.         D/t pt's declined Cognitive status w/ Confusion and Agitation, and her High risk for aspiration at this time, recommend Full NPO status w/ alternative means for medications. Recommend frequent oral care for hygiene and stimulation of swallow as pt premits. MD/NSG updated. ST services recommends follow w/ Palliative Care for [place] and education re: impact of Cognitive decline/Dementia on safe swallowing. ST services will follow pt while admitted for ongoing assessment to determine least restrictive oral diet as possible. Largely suspect that pt's Cognitive decline could hamper upgrade of diet. Recommendations and precautions posted in room. SLP Visit Diagnosis: Dysphagia, oropharyngeal phase (R13.12) (Cognitive decline)    Aspiration Risk  Severe aspiration risk;Risk for inadequate nutrition/hydration    Diet Recommendation   NPO w/ frequent oral care for hygiene and stimulation of swallowing  Medication Administration: Via alternative means    Other  Recommendations Recommended Consults:  (Palliative Care consult; Dietician f/u) Oral Care Recommendations: Oral care QID;Staff/trained caregiver to provide oral care Other Recommendations:  (TBD)   Follow up Recommendations  (TBD)      Frequency and Duration min 3x week  2 weeks       Prognosis Prognosis for Safe Diet Advancement: Guarded Barriers to Reach Goals: Cognitive deficits;Language deficits;Time post onset;Behavior;Severity of deficits      Swallow Study   General  Date of Onset: 01/24/21 HPI: Pt is a 87 y.o. female with medical history significant for MULTIPLE medical issues including nonischemic cardiomyopathy, subsequent improvement in LV function, chronic heart failure with improved ejection fraction, hypertension, hyperlipidemia, right breast cancer, thymus cancer, history of COVID-19 infection in January 2021, permanent atrial fibrillation, history of bladder incontinence, depression, GERD, history of right femur closed fracture in 2019, managed conservatively, history of right humerus head fracture in 2018, managed conservatively, history of right knee surgery, history of cataract surgery, history of right hip joint replacement, Major depression, single episode, mixed simple and mucopurulent chronic bronchitis, Falls who presents to the emergency department from facility for chief concerns of frequent falls over the last 2 weeks.  From a Cognitive standpoint, she was quite agitated, yelling, and attempting to hit/bite Staff during the physical exam in the ED.  Per ED MD note, "patient appears to have acute confusion on Dementia".  CT of Chest: "soft tissue nodule of the right mainstem bronchus.  Post surgical changes of the right lower lobe. Additional bilateral  basilar linear opacities, likely due to scarring or atelectasis.  Bilateral solid pulmonary nodules. Reference nodule of the left  upper lobe measures 4 mm on series 4, image 38. No consolidation,  pleural effusion or pneumothorax.". Type of Study: Bedside Swallow Evaluation Previous Swallow Assessment: none per chart Diet Prior to this Study: NPO Temperature Spikes  Noted: No (wbc 21.6 declining) Respiratory Status: Room air (O2 sats 92%) History of Recent Intubation: No Behavior/Cognition: Alert;Confused;Agitated;Uncooperative;Requires cueing;Doesn't follow directions Oral Cavity Assessment: Dry (unable to determine further d/t Cognitive status) Oral Care Completed by SLP: Yes (attempted -- much  agitation) Oral Cavity - Dentition: Adequate natural dentition Vision:  (n/a) Self-Feeding Abilities: Total assist Patient Positioning: Upright in bed (needed full positioning) Baseline Vocal Quality: Normal Volitional Cough: Cognitively unable to elicit Volitional Swallow: Unable to elicit    Oral/Motor/Sensory Function Overall Oral Motor/Sensory Function:  (could not assess d/t Cognitive status)   Ice Chips Ice chips: Not tested   Thin Liquid Thin Liquid: Impaired Presentation: Spoon (3 trials) Oral Phase Impairments: Reduced lingual movement/coordination;Poor awareness of bolus Oral Phase Functional Implications:  (poor bolus control) Pharyngeal  Phase Impairments: Suspected delayed Swallow (audible swallows) Other Comments: high risk    Nectar Thick Nectar Thick Liquid: Not tested   Honey Thick Honey Thick Liquid: Not tested   Puree Puree: Not tested   Solid     Solid: Not tested        Orinda Kenner, MS, CCC-SLP Speech Language Pathologist Rehab Services [phone] [facility] 01/25/2021,12:13 PM

## 2021-01-26 NOTE — Progress Notes (Signed)
PT Cancellation Note  Patient Details Name: Jodi Bullock MRN: [id number] DOB: May 08, 1925   Cancelled Treatment:    Reason Eval/Treat Not Completed: Other (comment). Orders received and chart reviewed. Per MD note, pt's son who is POA plan to transition pt to comfort care. PT to sign off on orders.    Salem Caster. Fairly IV, PT, DPT Physical Therapist- [facility]  01/26/2021, 8:05 AM

## 2021-01-26 NOTE — TOC Initial Note (Signed)
Transition of Care [facility]) - Initial/Assessment Note    Patient Details  Name: Jodi Bullock MRN: [id number] Date of Birth: 1925/01/17  Transition of [facility]) CM/SW Contact:    Shelbie Hutching, RN Phone Number: 01/26/2021, 2:30 PM  Clinical Narrative:                 Patient admitted to the hospital with pyelonephritis.  Patient from [place].  Patient's family has decided to make patient comfort care and originally wanted patient to return to [facility] with hospice but after son sees patient at the bedside today he does not want the patient returning there he opts for the hospice home after speaking with the hospice liaison, Sonia Baller.   Hospice home referral placed.    Expected Discharge Plan: [place] Barriers to Discharge: Hospice Bed not available   Patient Goals and CMS Choice Patient states their goals for this hospitalization and ongoing recovery are:: Family has choosen comfort care and hospice home CMS Medicare.gov Compare Post Acute Care list provided to:: Patient Represenative (must comment) Choice offered to / list presented to : [facility] POA / Guardian  Expected Discharge Plan and Services Expected Discharge Plan: [place]   Discharge Planning Services: CM Consult Post Acute Care Choice: [place] Living arrangements for the past 2 months: Assisted Living Facility                 DME Arranged: N/A DME Agency: NA       HH Arranged: NA HH Agency: NA        Prior Living Arrangements/Services Living arrangements for the past 2 months: [place] Lives with:: Facility Resident Patient language and need for interpreter reviewed:: Yes Do you feel safe going back to the place where you live?: Yes      Need for Family Participation in Patient Care: Yes (Comment) Care giver support system in place?: Yes (comment)   Criminal Activity/Legal Involvement Pertinent to Current Situation/Hospitalization:  No - Comment as needed  Activities of Daily Living      Permission Sought/Granted Permission sought to share information with : Case Manager, Family Supports, Customer service manager Permission granted to share information with : Yes, Verbal Permission Granted  Share Information with NAME: Tola Rivest  Permission granted to share info w AGENCY: [place] granted to share info w Relationship: son (POA)     Emotional Assessment Appearance:: Appears stated age       Alcohol / Substance Use: Not Applicable Psych Involvement: No (comment)  Admission diagnosis:  Metabolic encephalopathy 99991111 Weakness generalized [R53.1] Pyelonephritis [N12] Blunt trauma [T14.90XA] Acute pyelonephritis [N10] Patient Active Problem List   Diagnosis Date Noted   Acute pyelonephritis 01/25/2021   Pyelonephritis 01/24/2021   Ecchymosis on examination 01/24/2021   Frequent falls 01/24/2021   Debility 01/24/2021   Failure to thrive in adult 01/24/2021   Protein-calorie malnutrition, severe ([place]) 01/24/2021   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 05/17/2020   Atrophy of vagina 05/17/2020   Chronic pain syndrome 05/17/2020   Closed fracture pubis ([place]) 05/17/2020   Constipation by delayed colonic transit 05/17/2020   Contusion of unspecified part of head, initial encounter 05/17/2020   COVID-19 05/17/2020   Dysuria 05/17/2020   Increased frequency of urination 05/17/2020   Acute upper respiratory infection 05/17/2020   Laceration without foreign body of left elbow, initial encounter 05/17/2020   Macrocytosis 05/17/2020   Major depression, single episode  05/17/2020   Mixed simple and mucopurulent chronic bronchitis ([place]) 05/17/2020   Ulcer of right foot ([place]) 05/17/2020   Overactive bladder 05/17/2020   Pain in limb 05/17/2020   Atrial flutter ([place]) 05/17/2020   Peripheral edema 05/17/2020   Pernicious anemia 05/17/2020    Restless legs syndrome 05/17/2020   Sciatica 05/17/2020   Tinea unguium 05/17/2020   Unspecified fracture of sacrum, initial encounter for closed fracture ([place]) 05/17/2020   Vaginal candidiasis 05/17/2020   Vitamin B12 deficiency anemia, unspecified 05/17/2020   Weight loss 05/17/2020   Microscopic hematuria 05/21/2019   Femur fracture ([place]) 09/09/2017   Abnormal gait 12/19/2016   Acquired spondylolisthesis 12/19/2016   Breast lump 12/19/2016   Cervical spondylosis without myelopathy 12/19/2016   Closed Colles' fracture 12/19/2016   Closed fracture of base of neck of femur ([place]) 12/19/2016   Closed fracture of lower end of radius and ulna 12/19/2016   Closed fracture of neck of femur ([place]) 12/19/2016   Spondylolisthesis, congenital 12/19/2016   Contracture of joint of hand 12/19/2016   Contracture of wrist joint 12/19/2016   Contusion of hand 12/19/2016   Contusion of hip 12/19/2016   Contusion of knee 12/19/2016   Degeneration of intervertebral disc at C4-C5 level 12/19/2016   Degeneration of lumbar intervertebral disc 12/19/2016   Disorder of coccyx 12/19/2016   Dry eyes 12/19/2016   Enthesopathy 12/19/2016   Enthesopathy of hip region 12/19/2016   Hand joint pain 12/19/2016   Hand joint stiff 12/19/2016   Hip pain 12/19/2016   Joint pain 12/19/2016   Knee pain 12/19/2016   Localized, primary osteoarthritis 12/19/2016   Loose body in hip joint 12/19/2016   Lumbar sprain 12/19/2016   Abnormal mammogram 12/19/2016   Meibomian gland dysfunction 12/19/2016   Muscle weakness 12/19/2016   Open fracture of lower end of forearm 12/19/2016   Presbyopia 12/19/2016   Regular astigmatism 12/19/2016   Shoulder joint pain 12/19/2016   Skin sensation disturbance 12/19/2016   Sprain of ankle 12/19/2016   Trochanteric bursitis 12/19/2016   Wrist joint pain 12/19/2016   Stiffness of wrist joint 12/19/2016   Acute bilateral low back pain without sciatica 09/29/2016   Acute  bronchitis 06/19/2016   Recurrent productive cough 06/14/2016   Medicare annual wellness visit, subsequent 05/17/2016   Fall in elderly patient 01/27/2016   Sprain of wrist, right [id number]   Systolic CHF with reduced left ventricular function, NYHA class 3 ([place]) 01/10/2016   Productive cough [id number]   Chronic systolic heart failure ([place]) 10/12/2015   Dyspnea on exertion 08/31/2015   Fatigue 08/31/2015   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[place]) 08/04/2015   Irregular heart rhythm 07/22/2015   Left arm pain 06/29/2015   Dyslipidemia 06/03/2015   Acute recurrent maxillary sinusitis 06/03/2015   Oral mucosal lesion 05/26/2015   Urge incontinence 02/14/2015   Ankle edema 02/02/2015   At risk for falling 01/25/2015   Dizziness 01/25/2015   Acid reflux 01/25/2015   Big thyroid 01/25/2015   Gravida 2 para 2 01/25/2015   Personal history of malignant neoplasm of breast 01/25/2015   Arthritis, degenerative 01/25/2015   Parity 2 01/25/2015   Peripheral blood vessel disorder ([place]) 01/25/2015   Need for vaccination 01/25/2015   Pain in rectum 01/25/2015   Reflux 01/25/2015   Screening for depression 01/25/2015   Disease of accessory sinus 01/25/2015   Headache, temporal 01/25/2015   Primary cancer of thymus ([place]) 01/25/2015   Disease of thyroid gland 01/25/2015   Avitaminosis D 01/25/2015   Family  history of diabetes mellitus in father 12/31/2014   Fasting hyperglycemia 12/31/2014   Hypertension 12/01/2014   HLD (hyperlipidemia) 12/01/2014   Anxiety 12/01/2014   Myofascial pain 12/01/2014   Breast CA ([place]) 11/24/2014   Absence of bladder continence 11/24/2014   Urgency of micturation 11/24/2014   Common bile duct dilatation 10/20/2014   Pancreatic cyst 10/20/2014   Kidney stones 10/20/2014   Neuritis or radiculitis due to rupture of lumbar intervertebral disc 06/15/2014   Lumbar canal stenosis 06/15/2014   Degenerative arthritis of lumbar spine 06/15/2014   Lumbar radiculitis 06/15/2014    Osteoarthritis of spine with radiculopathy, lumbar region 06/15/2014   Carrier of infectious disease 07/21/2013   Bloodgood disease 11/05/2012   Arthritis of temporomandibular joint 06/24/2012   Atypical chest pain 01/05/2012   Breath shortness 12/04/2011   Lung mass 11/15/2011   Thyroid nodule 11/15/2011   Age-related macular degeneration, dry 10/03/2011   Disorder of peripheral nervous system 10/03/2011   Chronic rhinitis 03/03/2011   Carotid artery narrowing 03/01/2011   Polypharmacy 12/02/2010   PCP:  [facility] Pharmacy:   [facility] [id number], [address] [place] [address] Phone: [phone] Fax: [phone]     Social Determinants of Health (SDOH) Interventions    Readmission Risk Interventions No flowsheet data found.

## 2021-01-26 NOTE — Progress Notes (Signed)
1       [place] [facility] [place] NAME: Jodi Bullock    MR#:  [id number]  PCP: [facility]  DATE OF BIRTH:  1925-02-11  SUBJECTIVE:  CHIEF COMPLAINT:   Chief Complaint  Patient presents with   Fall   Altered Mental Status  Patient keeps repeating wanting to leave and denies any insight into why she is here REVIEW OF SYSTEMS:  ROS unable to obtain due to her mental status DRUG ALLERGIES:   Allergies  Allergen Reactions   2,4-D Dimethylamine (Amisol) Other (See Comments)    Other Reaction: OTHER REACTION-IRRITATION TO Esophagous   Celecoxib Other (See Comments)     esophagitis Esophageal spasms   Other reaction(s): Unknown   Ciprofloxacin Other (See Comments)    Esophageal irritation Other reaction(s): Unknown   Ibuprofen Other (See Comments)    Other reaction(s): Other (See Comments) reflux Other reaction(s): Unknown   Levamisole Other (See Comments) and Nausea And Vomiting    Other reaction(s): Unknown   Metronidazole Other (See Comments)    neuropathy Other reaction(s): Unknown   Naproxen Other (See Comments)    GI UPSET Other reaction(s): Unknown   Other Other (See Comments)    Other Reaction: esophagitis   VITALS:  Blood pressure (!) 160/102, pulse 70, temperature 98.6 F (37 C), temperature source Oral, resp. rate 19, height '5\' 4"'$  (1.626 m), weight 62 kg, SpO2 92 %. PHYSICAL EXAMINATION:  Physical Exam 87 year old female lying in the bed, NAD Eyes closed, sleepy  Face she has multiple bruises and ecchymosis all over her face Lungs clear to auscultation bilaterally no wheezing rales rhonchi or crepitation Cardiovascular S1-S2 normal, no murmur rales or gallop Abdomen soft, benign Neuro: sleepy, could not assess Skin: Patient has open laceration and skin tear on her left forearm.  Multiple ecchymosis and bruising all over her body particularly on all extremities likely from falls LABORATORY PANEL:  Female CBC Recent  Labs  Lab 01/25/21 0908  WBC 21.6*  HGB 14.5  HCT 41.9  PLT 147*    ------------------------------------------------------------------------------------------------------------------ Chemistries  Recent Labs  Lab 01/25/21 0908  NA 137  K 4.0  CL 102  CO2 22  GLUCOSE 133*  BUN 26*  CREATININE 1.22*  CALCIUM 9.0    MEDICATIONS:  Scheduled Meds: Continuous Infusions:  cefTRIAXone (ROCEPHIN)  IV 2 g (01/26/21 1057)   dextrose 1,000 mL (01/25/21 1547)   RADIOLOGY:  No results found. ASSESSMENT AND PLAN:  87 year old female with a known history of nonischemic cardiomyopathy, essential hypertension, hyperlipidemia, right breast cancer, thymus cancer, history of COVID-19 in January 2021, permanent A. fib, history of bladder incontinence, depression, GERD, history of right femur fracture in 2019.  Right humerus head fracture in 2018.  Right knee surgery, cataract surgery, right hip joint replacement, multiple falls is admitted for altered mental status from [place] house  9/13: Comfort care in place.   9/14 qualified for hospice care, patient will be transferred tomorrow to hospice facility  Principal Problem:   Pyelonephritis Active Problems:   Hypertension   HLD (hyperlipidemia)   Anxiety   Polypharmacy   Arthritis, degenerative   Ankle edema   Dyslipidemia   Systolic CHF with reduced left ventricular function, NYHA class 3 (HCC)   Acute bilateral low back pain without sciatica   Abnormal gait   Atrial flutter (HCC)   Vitamin B12 deficiency anemia, unspecified   Ecchymosis on examination   Frequent falls   Debility   Failure to thrive  in adult   Protein-calorie malnutrition, severe ([place])   Acute pyelonephritis  Acute metabolic encephalopathy Acute pyelonephritis Right mainstem bronchus nodule/debris Acute kidney injury  Atrial flutter Bilateral solid pulmonary nodule Severe protein calorie malnutrition Dementia with behavioral disturbances Recurrent  falls Failure to thrive History of subarachnoid hemorrhage which is stable  Goals of care: Patient's husband passed away in 2020-08-06 and was under hospice care.  Since then patient has had significant clinical decline according to son.  Patient's son who is her POA is agreeable for transition to comfort care and hospice.  social worker help appreciated, patient qualified for hospice care.  Patient will be transferred to hospice facility tomorrow a.m.    Body mass index is 23.46 kg/m.  Net IO Since Admission: 1,643.13 mL [01/26/21 1557]      LOS: 1 day   Consultants: Palliative care    Antibiotics: Rocephin  Status is: Inpatient  The patient will require care spanning > 2 midnights and should be moved to inpatient because: Inpatient level of care appropriate due to severity of illness  Dispo: The patient is from:  [place]              Anticipated d/c is to:  Hospice home              Patient currently is not medically stable to d/c.   Difficult to place patient No    DVT prophylaxis:       Place TED hose Start: 01/24/21 1518     Family Communication: Discussed with son/POA over phone   All the records are reviewed and case discussed with Nursing and TOC team. Management plans discussed with the patient, family and they are in agreement.  CODE STATUS: DNR Level of care: Med-Surg  TOTAL TIME TAKING CARE OF THIS PATIENT: 35 minutes.   More than 50% of the time was spent in counseling/coordination of care: YES  POSSIBLE D/C IN 1-2 DAYS, DEPENDING ON CLINICAL CONDITION.   Val Riles M.D on 01/26/2021 at 3:57 PM  [facility]   CC: Primary care physician; [facility]  Note: This dictation was prepared with Dragon dictation along with smaller phrase technology. Any transcriptional errors that result from this process are unintentional.

## 2021-01-26 NOTE — Progress Notes (Signed)
OT Cancellation Note  Patient Details Name: Jodi Bullock MRN: [id number] DOB: 10-14-1924   Cancelled Treatment:    Reason Eval/Treat Not Completed: Other (comment). Per chart review, pt has now transitioned to comfort care. OT to sign off at this time. Please re-consult if additional needs arise.    Fredirick Maudlin, OTR/L [place]

## 2021-01-26 NOTE — Progress Notes (Addendum)
Manufacturing engineer [facility]) Hospital Liaison Note   Patient originally Hospice in ALF referral. She has continued to decline. Received request from Transitions of Care Manager Doran Clay, RN for family interest in [place]. Visited patient at bedside and spoke with son, Kamden Pooler to confirm interest and explain services. Patient chart and information reviewed by [facility] physician. Hospice Home eligibility confirmed.    Unfortunately, Hospice Home is not able to offer a room today. Plan is to transport patient to [place] on 9.15.22 via [facility]. Family and [facility] Manager aware hospital liaison will follow up tomorrow or sooner if a room becomes available.    Please do not hesitate to call with any hospice related questions.    Thank you for the opportunity to participate in this patient's care.   Bobbie "Loren Racer, RN, BSN [facility] Liaison [phone]

## 2021-01-27 LAB — RESP PANEL BY RT-PCR (FLU A&B, COVID) ARPGX2
Influenza A by PCR: NEGATIVE
Influenza B by PCR: NEGATIVE
SARS Coronavirus 2 by RT PCR: NEGATIVE

## 2021-01-27 NOTE — TOC Progression Note (Signed)
Transition of Care [facility]) - Progression Note    Patient Details  Name: Jodi Bullock MRN: [id number] Date of Birth: 11/15/1924  Transition of Care [facility]) CM/SW Contact  Shelbie Hutching, RN Phone Number: 01/27/2021, 12:29 PM  Clinical Narrative:    Hospice Home cannot take patient until 4pm.  Sonia Baller with [place] rescheduled EMS transport for 4.     Expected Discharge Plan: [place] Barriers to Discharge: Barriers Resolved  Expected Discharge Plan and Services Expected Discharge Plan: [place]   Discharge Planning Services: CM Consult Post Acute Care Choice: [place] Living arrangements for the past 2 months: [place] Expected Discharge Date: 01/27/21               DME Arranged: N/A DME Agency: NA       HH Arranged: NA HH Agency: NA         Social Determinants of Health (SDOH) Interventions    Readmission Risk Interventions No flowsheet data found.

## 2021-01-27 NOTE — TOC Transition Note (Signed)
Transition of Care [facility]) - CM/SW Discharge Note   Patient Details  Name: Aiyona Morra MRN: [id number] Date of Birth: Sep 23, 1924  Transition of Care [facility]) CM/SW Contact:  Shelbie Hutching, RN Phone Number: 01/27/2021, 8:45 AM   Clinical Narrative:    [facility] has a bed available today.  [facility] set up for transport at 1130.     Final next level of care: [place] Barriers to Discharge: Barriers Resolved   Patient Goals and CMS Choice Patient states their goals for this hospitalization and ongoing recovery are:: Family has choosen comfort care and hospice home CMS Medicare.gov Compare Post Acute Care list provided to:: Patient Represenative (must comment) Choice offered to / list presented to : [place] / [place]  Discharge Placement              Patient chooses bed at: Other - please specify in the comment section below: [facility]) Patient to be transferred to facility by: [facility] Name of family member notified: Belenda Cruise Patient and family notified of of transfer: 01/27/21  Discharge Plan and Services   Discharge Planning Services: CM Consult Post Acute Care Choice: [place]          DME Arranged: N/A DME Agency: NA       HH Arranged: NA HH Agency: NA        Social Determinants of Health (SDOH) Interventions     Readmission Risk Interventions No flowsheet data found.

## 2021-01-27 NOTE — Discharge Summary (Signed)
[facility] Discharge Summary   Patient: Jodi Bullock [id number]  PCP: [facility]  Date of admission: 01/24/2021   Date of discharge:  01/27/2021     Discharge Diagnoses:  Principal Problem:   Pyelonephritis Active Problems:   Hypertension   HLD (hyperlipidemia)   Anxiety   Polypharmacy   Arthritis, degenerative   Ankle edema   Dyslipidemia   Systolic CHF with reduced left ventricular function, NYHA class 3 (HCC)   Acute bilateral low back pain without sciatica   Abnormal gait   Atrial flutter (HCC)   Vitamin B12 deficiency anemia, unspecified   Ecchymosis on examination   Frequent falls   Debility   Failure to thrive in adult   Protein-calorie malnutrition, severe ([place])   Acute pyelonephritis   Admitted From: ALF Disposition: Hospice facility  Recommendations for Outpatient Follow-up:  Follow hospice care Follow up LABS/TEST:  no need    Diet recommendation: as tolerated   Activity: The patient is advised to gradually reintroduce usual activities, as tolerated  Discharge Condition: stable  Code Status: DNR   History of present illness: As per the H and P dictated on admission Hospital Course:  87 year old female with a known history of nonischemic cardiomyopathy, essential hypertension, hyperlipidemia, right breast cancer, thymus cancer, history of COVID-19 in January 2021, permanent A. fib, history of bladder incontinence, depression, GERD, history of right femur fracture in 08/22/2017.  Right humerus head fracture in 08-22-16.  Right knee surgery, cataract surgery, right hip joint replacement, multiple falls is admitted for altered mental status from [facility] 9/13: Comfort care in place.   9/14 qualified for hospice care, patient will be transferred tomorrow to hospice facility 9/15 patient is being transferred to hospice home today    Acute metabolic encephalopathy Acute pyelonephritis Right mainstem bronchus nodule/debris Acute kidney  injury  Atrial flutter Bilateral solid pulmonary nodule Severe protein calorie malnutrition Dementia with behavioral disturbances Recurrent falls Failure to thrive History of subarachnoid hemorrhage which is stable   Goals of care: Patient's husband passed away in 22-Aug-2020 and was under hospice care.  Since then patient has had significant clinical decline according to son.  Patient's son who is her POA is agreeable for transition to comfort care and hospice.  social worker help appreciated, patient qualified for hospice care.  Patient is being to hospice facility today    Body mass index is 23.46 kg/m.  Consultants: Palliative care and hospice team Procedures: None  Discharge Exam: General: Appear in no distress, bruises on the face; MM dry. Cardiovascular: S1 and S2 Present, no Murmur, Respiratory: normal respiratory effort, Bilateral Air entry present and no Crackles, no wheezes Abdomen: Bowel Sound present, Soft and no tenderness, no hernia Extremities: no Pedal edema, no calf tenderness Neurology: sleepy, unable to follow commands, moving extremities spontaneously   Filed Weights   01/24/21 1059  Weight: 62 kg   Vitals:   01/26/21 0319 01/27/21 0452  BP: (!) 160/102 109/85  Pulse: 70 99  Resp: 19 19  Temp: 98.6 F (37 C) 97.8 F (36.6 C)  SpO2: 92% 90%    DISCHARGE MEDICATION: Allergies as of 01/27/2021       Reactions   2,4-d Dimethylamine (amisol) Other (See Comments)   Other Reaction: OTHER REACTION-IRRITATION TO Esophagous   Celecoxib Other (See Comments)    esophagitis Esophageal spasms   Other reaction(s): Unknown   Ciprofloxacin Other (See Comments)   Esophageal irritation Other reaction(s): Unknown   Ibuprofen Other (See Comments)  Other reaction(s): Other (See Comments) reflux Other reaction(s): Unknown   Levamisole Other (See Comments), Nausea And Vomiting   Other reaction(s): Unknown   Metronidazole Other (See Comments)    neuropathy Other reaction(s): Unknown   Naproxen Other (See Comments)   GI UPSET Other reaction(s): Unknown   Other Other (See Comments)   Other Reaction: esophagitis        Medication List     STOP taking these medications    carvedilol 6.25 MG tablet Commonly known as: COREG   Cholecalciferol 50 MCG (2000 UT) Tabs   diclofenac sodium 1 % Gel Commonly known as: VOLTAREN   docusate sodium 100 MG capsule Commonly known as: COLACE   doxycycline 100 MG tablet Commonly known as: VIBRA-TABS   Eliquis 5 MG Tabs tablet Generic drug: apixaban   fluticasone 50 MCG/ACT nasal spray Commonly known as: FLONASE   Fluticasone Furoate 100 MCG/ACT Aepb Commonly known as: Arnuity Ellipta   furosemide 20 MG tablet Commonly known as: LASIX   furosemide 40 MG tablet Commonly known as: LASIX   gabapentin 300 MG capsule Commonly known as: NEURONTIN   lidocaine 4 % cream Commonly known as: LMX   loratadine 10 MG tablet Commonly known as: CLARITIN   LORazepam 0.5 MG tablet Commonly known as: ATIVAN   losartan 100 MG tablet Commonly known as: COZAAR   lovastatin 40 MG tablet Commonly known as: MEVACOR   mirabegron ER 25 MG Tb24 tablet Commonly known as: MYRBETRIQ   polyethylene glycol 17 g packet Commonly known as: MIRALAX / GLYCOLAX   Premarin vaginal cream Generic drug: conjugated estrogens   sertraline 25 MG tablet Commonly known as: ZOLOFT   traMADol 50 MG tablet Commonly known as: ULTRAM   trimethoprim 100 MG tablet Commonly known as: TRIMPEX       TAKE these medications    acetaminophen 650 MG CR tablet Commonly known as: TYLENOL Take 650 mg by mouth 3 (three) times daily.               Discharge Care Instructions  (From admission, onward)           Start     Ordered   01/27/21 0000  Discharge wound care:       Comments: Continue wound care   01/27/21 1105           Allergies  Allergen Reactions   2,4-D Dimethylamine  (Amisol) Other (See Comments)    Other Reaction: OTHER REACTION-IRRITATION TO Esophagous   Celecoxib Other (See Comments)     esophagitis Esophageal spasms   Other reaction(s): Unknown   Ciprofloxacin Other (See Comments)    Esophageal irritation Other reaction(s): Unknown   Ibuprofen Other (See Comments)    Other reaction(s): Other (See Comments) reflux Other reaction(s): Unknown   Levamisole Other (See Comments) and Nausea And Vomiting    Other reaction(s): Unknown   Metronidazole Other (See Comments)    neuropathy Other reaction(s): Unknown   Naproxen Other (See Comments)    GI UPSET Other reaction(s): Unknown   Other Other (See Comments)    Other Reaction: esophagitis   Discharge Instructions     Diet - low sodium heart healthy   Complete by: As directed    Discharge instructions   Complete by: As directed    Follow hospice care   Discharge wound care:   Complete by: As directed    Continue wound care   Increase activity slowly   Complete by: As directed  The results of significant diagnostics from this hospitalization (including imaging, microbiology, ancillary and laboratory) are listed below for reference.    Significant Diagnostic Studies: CT HEAD WO CONTRAST  Result Date: 01/24/2021 CLINICAL DATA:  Decreased level of consciousness.  Recent fall. EXAM: CT HEAD WITHOUT CONTRAST TECHNIQUE: Contiguous axial images were obtained from the base of the skull through the vertex without intravenous contrast. COMPARISON:  01/09/2021. FINDINGS: Brain: Age related atrophy. Chronic small-vessel ischemic changes of the white matter. No sign of acute infarction, mass lesion, hemorrhage, hydrocephalus or extra-axial collection. Vascular: There is atherosclerotic calcification of the major vessels at the base of the brain. Skull: Negative Sinuses/Orbits: Previous functional endoscopic sinus surgery. Orbits negative. Other: None IMPRESSION: No acute or traumatic finding.  Atrophy and chronic small-vessel ischemic changes of the white matter. Electronically Signed   By: Nelson Chimes M.D.   On: 01/24/2021 14:08   CT HEAD WO CONTRAST (5MM)  Result Date: 01/09/2021 CLINICAL DATA:  Stroke follow-up. EXAM: CT HEAD WITHOUT CONTRAST TECHNIQUE: Contiguous axial images were obtained from the base of the skull through the vertex without intravenous contrast. COMPARISON:  Same day. FINDINGS: Brain: Stable small subarachnoid hemorrhage is noted in the left posterior parietal cortex. No mass effect or midline shift is noted. Ventricular size is within normal limits. No new hemorrhage is noted. No acute infarction is noted. Chronic ischemic white matter disease is again noted. Vascular: No hyperdense vessel or unexpected calcification. Skull: Normal. Negative for fracture or focal lesion. Sinuses/Orbits: No acute finding. Other: Right periorbital hematoma noted on prior exam is now significantly enlarged. IMPRESSION: Stable small subarachnoid hemorrhage is noted in the left posterior parietal cortex. Right periorbital scalp hematoma is significantly enlarged compared to prior exam. Electronically Signed   By: Marijo Conception M.D.   On: 01/09/2021 14:30   CT HEAD WO CONTRAST (5MM)  Addendum Date: 01/09/2021   ADDENDUM REPORT: 01/09/2021 07:25 ADDENDUM: Critical Value/emergent results were called by telephone at the time of interpretation on 01/09/2021 at 7:25 am to provider [facility] , who verbally acknowledged these results. Electronically Signed   By: Kerby Moors M.D.   On: 01/09/2021 07:25   Result Date: 01/09/2021 CLINICAL DATA:  Head trauma. Laceration in ecchymosis noted to the right eye. EXAM: CT HEAD WITHOUT CONTRAST CT CERVICAL SPINE WITHOUT CONTRAST TECHNIQUE: Multidetector CT imaging of the head and cervical spine was performed following the standard protocol without intravenous contrast. Multiplanar CT image reconstructions of the cervical spine were also generated.  COMPARISON:  10/25/2020 FINDINGS: CT HEAD FINDINGS Brain: There is a small focus of subarachnoid hemorrhage overlying the left posterior parietal lobe and temporal lobe, images 12/2 through 16/2. No additional areas of acute hemorrhage identified. No mass effect or midline shift. There is mild diffuse low-attenuation within the subcortical and periventricular white matter compatible with chronic microvascular disease. Prominence of the sulci and ventricles compatible with brain atrophy. Vascular: No hyperdense vessel or unexpected calcification. Skull: Normal. Negative for fracture or focal lesion. Sinuses/Orbits: Chronic mucosal thickening involving the paranasal sinuses. Status post bilateral median antrectomy. Mastoid air cells are clear. Other: Large right periorbital hematoma measures 1.5 x 4.5 cm, image 17/3. CT CERVICAL SPINE FINDINGS Alignment: Normal. Skull base and vertebrae: The vertebral body heights are well preserved. The facet joints are aligned. Soft tissues and spinal canal: No prevertebral fluid or swelling. No visible canal hematoma. Disc levels: Multi level disc space narrowing and mild endplate spurring is identified. This is most advanced at C6-7. Upper chest:  Negative. Other: Extensive aortic atherosclerotic calcifications and calcifications of the carotid arteries. IMPRESSION: 1. Small focus of subarachnoid hemorrhage overlies the left posterior parietal lobe and posterior temporal lobe. 2. Chronic small vessel ischemic change and brain atrophy. 3. Large right periorbital hematoma. 4. No evidence for cervical spine fracture or dislocation. 5. Cervical spondylosis. 6. Aortic atherosclerosis. Aortic Atherosclerosis (ICD10-I70.0). Electronically Signed: By: Kerby Moors M.D. On: 01/09/2021 07:23   CT Cervical Spine Wo Contrast  Addendum Date: 01/09/2021   ADDENDUM REPORT: 01/09/2021 07:25 ADDENDUM: Critical Value/emergent results were called by telephone at the time of interpretation on  01/09/2021 at 7:25 am to provider [facility] , who verbally acknowledged these results. Electronically Signed   By: Kerby Moors M.D.   On: 01/09/2021 07:25   Result Date: 01/09/2021 CLINICAL DATA:  Head trauma. Laceration in ecchymosis noted to the right eye. EXAM: CT HEAD WITHOUT CONTRAST CT CERVICAL SPINE WITHOUT CONTRAST TECHNIQUE: Multidetector CT imaging of the head and cervical spine was performed following the standard protocol without intravenous contrast. Multiplanar CT image reconstructions of the cervical spine were also generated. COMPARISON:  10/25/2020 FINDINGS: CT HEAD FINDINGS Brain: There is a small focus of subarachnoid hemorrhage overlying the left posterior parietal lobe and temporal lobe, images 12/2 through 16/2. No additional areas of acute hemorrhage identified. No mass effect or midline shift. There is mild diffuse low-attenuation within the subcortical and periventricular white matter compatible with chronic microvascular disease. Prominence of the sulci and ventricles compatible with brain atrophy. Vascular: No hyperdense vessel or unexpected calcification. Skull: Normal. Negative for fracture or focal lesion. Sinuses/Orbits: Chronic mucosal thickening involving the paranasal sinuses. Status post bilateral median antrectomy. Mastoid air cells are clear. Other: Large right periorbital hematoma measures 1.5 x 4.5 cm, image 17/3. CT CERVICAL SPINE FINDINGS Alignment: Normal. Skull base and vertebrae: The vertebral body heights are well preserved. The facet joints are aligned. Soft tissues and spinal canal: No prevertebral fluid or swelling. No visible canal hematoma. Disc levels: Multi level disc space narrowing and mild endplate spurring is identified. This is most advanced at C6-7. Upper chest: Negative. Other: Extensive aortic atherosclerotic calcifications and calcifications of the carotid arteries. IMPRESSION: 1. Small focus of subarachnoid hemorrhage overlies the left posterior  parietal lobe and posterior temporal lobe. 2. Chronic small vessel ischemic change and brain atrophy. 3. Large right periorbital hematoma. 4. No evidence for cervical spine fracture or dislocation. 5. Cervical spondylosis. 6. Aortic atherosclerosis. Aortic Atherosclerosis (ICD10-I70.0). Electronically Signed: By: Kerby Moors M.D. On: 01/09/2021 07:23   DG Chest Portable 1 View  Result Date: 01/24/2021 CLINICAL DATA:  Weakness EXAM: PORTABLE CHEST 1 VIEW COMPARISON:  Right humerus radiograph 07/20/2016 FINDINGS: Unchanged, enlarged cardiac silhouette with prior median sternotomy and mediastinal postsurgical changes. Evidence of prior right lower lung wedge resection. There are mild diffuse interstitial opacities and possible patchy peripheral airspace opacities bilaterally. Chronic right proximal humerus fracture with residual deformity. Bilateral AC joint arthropathy. Bilateral glenohumeral arthritis. Thoracic spondylosis. No acute osseous abnormality. IMPRESSION: Unchanged cardiomegaly. Diffuse interstitial opacities with possible patchy airspace disease in the periphery bilaterally, suggestive of mild edema or atypical multifocal infectious process. Electronically Signed   By: Maurine Simmering M.D.   On: 01/24/2021 13:53   CT CHEST ABDOMEN PELVIS WO CONTRAST  Result Date: 01/24/2021 CLINICAL DATA:  Abdominal pain EXAM: CT CHEST, ABDOMEN AND PELVIS WITHOUT CONTRAST TECHNIQUE: Multidetector CT imaging of the chest, abdomen and pelvis was performed following the standard protocol without IV contrast. COMPARISON:  CT abdomen and  pelvis dated October 20, 2020 FINDINGS: CT CHEST FINDINGS Cardiovascular: Cardiomegaly. No pericardial effusion. Coronary artery calcifications status post CABG. Atherosclerotic disease of the thoracic aorta. Mediastinum/Nodes: Esophagus is unremarkable. No pathologically enlarged lymph nodes seen in the chest. Lungs/Pleura: Soft tissue nodule of the right mainstem bronchus. Post surgical  changes of the right lower lobe. Additional bilateral basilar linear opacities, likely due to scarring or atelectasis. Bilateral solid pulmonary nodules. Reference nodule of the left upper lobe measures 4 mm on series 4, image 38. No consolidation, pleural effusion or pneumothorax. Musculoskeletal: Mild compression deformities of T11, T10, unchanged compared to prior. Mild T4 compression deformity. CT ABDOMEN PELVIS FINDINGS Hepatobiliary: No focal liver abnormality is seen. Status post cholecystectomy. No biliary dilatation. Pancreas: Cystic lesion of the pancreatic head, measuring up to 1.6 cm, unchanged compared to prior. Spleen: Unremarkable. Adrenals/Urinary Tract: Adrenal glands are unremarkable. No hydronephrosis. Punctate bilateral nonobstructing renal stones increased soft tissue stranding seen about the lower pole and mid region of the left kidney. Bladder is not well visualized due to streak artifact. Stomach/Bowel: Stomach is within normal limits. Appendix is not visualized. No evidence of bowel wall thickening, distention, or inflammatory changes. Vascular/Lymphatic: Aortic atherosclerosis. No enlarged abdominal or pelvic lymph nodes. Reproductive: Status post hysterectomy. No adnexal masses. Other: Previous ventral wall hernia repair. No abdominopelvic ascites. Musculoskeletal: Prior right total hip arthroplasty. Grade 1 anterolisthesis of L4 on L5. Compression deformities of L3 and L1, unchanged compared to prior. IMPRESSION: Increased soft tissue stranding seen about the lower pole and mid region of the left kidney, concerning for pyelonephritis. Correlate with urinalysis. Soft tissue nodule of the right mainstem bronchus, possibly adherent debris. Recommend follow-up chest CT in 3 months to ensure resolution. Small solid bilateral pulmonary nodules. No follow-up needed if patient is low-risk. Non-contrast chest CT can be considered in 12 months if patient is high-risk. This recommendation follows  the consensus statement: Guidelines for Management of Incidental Pulmonary Nodules Detected on CT Images: From the Fleischner Society 2017; Radiology 2017; 284:228-243. Aortic Atherosclerosis (ICD10-I70.0). Electronically Signed   By: Yetta Glassman M.D.   On: 01/24/2021 14:46   CT Maxillofacial Wo Contrast  Result Date: 01/24/2021 CLINICAL DATA:  Decreased level of consciousness.  Recent fall. EXAM: CT HEAD WITHOUT CONTRAST TECHNIQUE: Contiguous axial images were obtained from the base of the skull through the vertex without intravenous contrast. COMPARISON:  01/09/2021. FINDINGS: Brain: Age related atrophy. Chronic small-vessel ischemic changes of the white matter. No sign of acute infarction, mass lesion, hemorrhage, hydrocephalus or extra-axial collection. Vascular: There is atherosclerotic calcification of the major vessels at the base of the brain. Skull: Negative Sinuses/Orbits: Previous functional endoscopic sinus surgery. Orbits negative. Other: None IMPRESSION: No acute or traumatic finding. Atrophy and chronic small-vessel ischemic changes of the white matter. Electronically Signed   By: Nelson Chimes M.D.   On: 01/24/2021 14:08    Microbiology: No results found for this or any previous visit (from the past 240 hour(s)).   Labs: CBC: Recent Labs  Lab 01/24/21 1102 01/25/21 0908  WBC 22.3* 21.6*  HGB 15.5* 14.5  HCT 46.1* 41.9  MCV 95.6 93.5  PLT 147* [id number]*   Basic Metabolic Panel: Recent Labs  Lab 01/24/21 1102 01/25/21 0908  NA 135 137  K 4.4 4.0  CL 97* 102  CO2 27 22  GLUCOSE 144* 133*  BUN 26* 26*  CREATININE 1.53* 1.22*  CALCIUM 9.3 9.0   Liver Function Tests: No results for input(s): AST, ALT, ALKPHOS, BILITOT, PROT, ALBUMIN  in the last 168 hours. No results for input(s): LIPASE, AMYLASE in the last 168 hours. Recent Labs  Lab 01/24/21 1633  AMMONIA <9*   Cardiac Enzymes: No results for input(s): CKTOTAL, CKMB, CKMBINDEX, TROPONINI in the last 168  hours. BNP (last 3 results) No results for input(s): BNP in the last 8760 hours. CBG: No results for input(s): GLUCAP in the last 168 hours.  Time spent: 35 minutes  Signed:  Val Riles  [facility]  01/27/2021 11:05 AM

## 2021-01-27 NOTE — Progress Notes (Signed)
[facility]) Hospital Liaison Note   Hospice Home is able to offer a room today.    Family agreeable to transfer today. Transport set up with [facility] at 4 pm today. Doran Clay, RN [facility] Manager aware.   RN please call report to [place] at [phone] prior to patient leaving the unit.  Please send signed and completed DNR with patient at discharge.   Please do not hesitate to call with any hospice related questions.    Thank you for the opportunity to participate in this patient's care.   Bobbie "Loren Racer, RN, BSN [facility] Liaison [phone]

## 2021-01-30 ENCOUNTER — Encounter: Payer: Self-pay | Admitting: Podiatry

## 2021-01-31 ENCOUNTER — Ambulatory Visit: Payer: Medicare Other | Admitting: Nurse Practitioner

## 2021-02-12 DEATH — deceased

## 2021-02-22 ENCOUNTER — Ambulatory Visit: Payer: Medicare Other | Admitting: Podiatry

## 2021-05-17 ENCOUNTER — Ambulatory Visit: Payer: Medicare Other | Admitting: Podiatry
